# Patient Record
Sex: Female | Born: 1990 | Race: White | Hispanic: No | Marital: Married | Smoking: Former smoker
Health system: Southern US, Community
[De-identification: ages and names within clinical notes are randomized; demographics above are authoritative.]

## PROBLEM LIST (undated history)

## (undated) ENCOUNTER — Inpatient Hospital Stay: Payer: Self-pay

## (undated) DIAGNOSIS — D696 Thrombocytopenia, unspecified: Secondary | ICD-10-CM

## (undated) DIAGNOSIS — F53 Postpartum depression: Secondary | ICD-10-CM

## (undated) DIAGNOSIS — F419 Anxiety disorder, unspecified: Secondary | ICD-10-CM

## (undated) DIAGNOSIS — N915 Oligomenorrhea, unspecified: Secondary | ICD-10-CM

## (undated) DIAGNOSIS — E079 Disorder of thyroid, unspecified: Secondary | ICD-10-CM

## (undated) DIAGNOSIS — O99345 Other mental disorders complicating the puerperium: Secondary | ICD-10-CM

## (undated) DIAGNOSIS — R519 Headache, unspecified: Secondary | ICD-10-CM

## (undated) DIAGNOSIS — O99119 Other diseases of the blood and blood-forming organs and certain disorders involving the immune mechanism complicating pregnancy, unspecified trimester: Secondary | ICD-10-CM

## (undated) DIAGNOSIS — M5126 Other intervertebral disc displacement, lumbar region: Secondary | ICD-10-CM

## (undated) DIAGNOSIS — R51 Headache: Secondary | ICD-10-CM

## (undated) DIAGNOSIS — G43111 Migraine with aura, intractable, with status migrainosus: Secondary | ICD-10-CM

## (undated) DIAGNOSIS — M51369 Other intervertebral disc degeneration, lumbar region without mention of lumbar back pain or lower extremity pain: Secondary | ICD-10-CM

## (undated) DIAGNOSIS — M5136 Other intervertebral disc degeneration, lumbar region: Secondary | ICD-10-CM

## (undated) HISTORY — DX: Anxiety disorder, unspecified: F41.9

## (undated) HISTORY — DX: Disorder of thyroid, unspecified: E07.9

## (undated) HISTORY — DX: Other intervertebral disc displacement, lumbar region: M51.26

## (undated) HISTORY — DX: Headache: R51

## (undated) HISTORY — DX: Other intervertebral disc degeneration, lumbar region without mention of lumbar back pain or lower extremity pain: M51.369

## (undated) HISTORY — DX: Other intervertebral disc degeneration, lumbar region: M51.36

## (undated) HISTORY — DX: Postpartum depression: F53.0

## (undated) HISTORY — DX: Migraine with aura, intractable, with status migrainosus: G43.111

## (undated) HISTORY — DX: Oligomenorrhea, unspecified: N91.5

## (undated) HISTORY — DX: Headache, unspecified: R51.9

## (undated) HISTORY — DX: Other mental disorders complicating the puerperium: O99.345

---
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
## 2008-01-10 ENCOUNTER — Ambulatory Visit: Payer: Self-pay | Admitting: Family Medicine

## 2008-04-01 ENCOUNTER — Ambulatory Visit: Payer: Self-pay | Admitting: Family Medicine

## 2008-04-17 ENCOUNTER — Ambulatory Visit: Payer: Self-pay | Admitting: Nurse Practitioner

## 2008-05-01 ENCOUNTER — Ambulatory Visit: Payer: Self-pay | Admitting: Family Medicine

## 2008-05-15 ENCOUNTER — Ambulatory Visit: Payer: Self-pay | Admitting: Nurse Practitioner

## 2008-07-01 ENCOUNTER — Ambulatory Visit: Payer: Self-pay | Admitting: Nurse Practitioner

## 2009-02-09 ENCOUNTER — Ambulatory Visit: Payer: Self-pay | Admitting: Family Medicine

## 2010-03-02 ENCOUNTER — Ambulatory Visit: Payer: Self-pay | Admitting: Nurse Practitioner

## 2010-09-05 HISTORY — PX: WISDOM TOOTH EXTRACTION: SHX21

## 2011-01-18 NOTE — Assessment & Plan Note (Signed)
NAMEIDA, MILBRATH NO.:  [id number]   MEDICAL RECORD NO.:  [id number]          PATIENT TYPE:  POB   LOCATION:  [facility] at [facility]         FACILITY:  [facility]   PHYSICIAN:  Ginger Carne, MD DATE OF BIRTH:  [birth year]   DATE OF SERVICE:                                  CLINIC NOTE   The patient comes to the office today for a headache followup.  The  patient did take her prednisone as was directed and she was able to get  off all of her over-the-counter medications and she is applauded in that  endeavor.  She has been using her Topamax.  She has gone back and forth  between 50 mg and 75 mg, depending on which she has to do the next day.  She has been using the Imitrex on an as-needed basis.  She has  discontinued all soda as well.  She quit smoking cigarettes 3 months  ago.  Overall, she is doing better.  She still does have some stress  ongoing in her life.  We did have a lengthy discussion about her  returning to exercise, particularly running and she has agreed to do  that.   PHYSICAL EXAMINATION:  VITAL SIGNS:  Blood pressure is 111/89, pulse is  66, weight 148, and height is 5 feet 8.  GENERAL:  Well-developed and well-nourished 20 year old Caucasian female  in no acute distress.  HEENT:  Head is normocephalic and atraumatic.  Pupils are equal and  react.  NEUROLOGIC:  The patient is intact.   ASSESSMENT AND PLAN:  Migraine headaches.   PLAN:  The patient will try to go up to and stay on 75 mg of Topamax  daily.  She was asked to split the dose.  She will also be given Norflex  100 mg 1 p.o. b.i.d. p.r.n. for muscle spasm.  She is again, applauded  for stopping all of her over-the-counter medicines, her sodas, and her  cigarettes.  She is asked to start running program.  She will return to  the clinic in 6 weeks.      Remonia Richter, NP    ______________________________  Ginger Carne, MD    LR/MEDQ  D:  05/15/2008  T:  05/15/2008   Job:  [id number]

## 2011-01-18 NOTE — Assessment & Plan Note (Signed)
[name], Morse NO.:  [id number]   MEDICAL RECORD NO.:  [id number]          PATIENT TYPE:  POB   LOCATION:  [facility] at [facility]         FACILITY:  [facility]   PHYSICIAN:  Allie Bossier, MD        DATE OF BIRTH:  Sep 05, 1991   DATE OF SERVICE:  03/02/2010                                  CLINIC NOTE   The patient comes to office today for followup on her migraine  headaches.  The patient was last seen in October 2009.  Since then, she  has had some worsening of her headaches.  She did not stay on her  Topamax, and she has only been given 4 Imitrex per month.  She, in the  last 2-3 weeks, has developed everyday headache.  She believes this is  related to the stress of school and a recent move.  She has been taking  a lot of over-the-counter medications and realizes she may have rebound  headaches at this point.  She is currently on birth control for  contraception and that is working well.   PHYSICAL EXAMINATION:  GENERAL:  Well-developed, well-nourished 20-year-  old Caucasian female, in no acute distress.  HEENT:  Head is normocephalic and atraumatic.  Pupils equal and react.  CARDIAC:  Regular rate and rhythm.  LUNGS:  Clear bilaterally.   ASSESSMENT:  1. Migraine.  2. Anxiety.   PLAN:  We will give her a prednisone taper.  She will take 20 mg 4 tabs  x1 day, 3 tabs x1 day, 2 tabs x1 day, and 1 tab x1 day.  When she is  finished with that, she will start on Effexor 75 mg 1/2 tablet for 1  week then up to 1 tablet #30 with 3 refills.  We will also switch her  from Imitrex to Maxalt.  She will take Maxalt 10 mg MLT 1 p.o. p.r.n.  migraine, okay to repeat once in 2 hours #12 with  p.r.n. refills.  She  will be scheduled in October for her first Pap smear.  She will be 19  then.  She will return to this clinic in 6 weeks or sooner if need be.      Remonia Richter, NP    ______________________________  Allie Bossier, MD    LR/MEDQ  D:  03/02/2010  T:   03/03/2010  Job:  [id number]

## 2011-01-18 NOTE — Assessment & Plan Note (Signed)
[name], [name]           ACCOUNT NO.:  [id number]   MEDICAL RECORD NO.:  [id number]          PATIENT TYPE:  POB   LOCATION:  [facility] at [facility]         FACILITY:  [facility]   PHYSICIAN:  Johnella Moloney, MD        DATE OF BIRTH:  Jul 05, 1991   DATE OF SERVICE:  07/01/2008                                  CLINIC NOTE   The patient comes to office today for headache followup.  She states  that her headaches have been worse in the last 2-3 weeks.  She relates  this to buying a house and moving with her fiance.  She has also some  stress with school and work.  She actually had a headache bad enough  last week to have to leave work and she did vomit.  She does not have  anything for nausea and vomiting.  She has gotten up to 75 mg on the  Topamax, and she was doing well with that.  She has been doing some  running not every day like she would like to.   PHYSICAL EXAMINATION:  Well-developed, well-nourished 20 year old  Caucasian in no acute distress.  Vital signs were stable.   ASSESSMENT:  Migraine.   PLAN:  We will give her a prednisone taper, prednisone 20 mg 4 tabs for  1 day, 3 tabs for 1 day, 2 tabs for 1 day, and 1 tab for 1 day.  She has  also pulled her back doing some furniture moving and that should help  with both things.  She will be given Phenergan to take on an as-needed  basis for nausea.  We will go up on her Topamax to 100 mg, and she was  given samples of Treximet as well as __________ for birth control pills.  She will return to the office in 3 months or sooner if need be.      Jennifer Morse, Jennifer Morse    ______________________________  Johnella Moloney, MD    LR/MEDQ  D:  07/01/2008  T:  07/02/2008  Job:  [id number]
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
## 2012-10-30 ENCOUNTER — Ambulatory Visit (INDEPENDENT_AMBULATORY_CARE_PROVIDER_SITE_OTHER): Payer: 59 | Admitting: Family Medicine

## 2012-10-30 ENCOUNTER — Encounter: Payer: Self-pay | Admitting: Family Medicine

## 2012-10-30 ENCOUNTER — Other Ambulatory Visit: Payer: Self-pay | Admitting: Family Medicine

## 2012-10-30 VITALS — BP 112/67 | HR 62 | Ht 68.5 in | Wt 183.0 lb

## 2012-10-30 MED ORDER — NORETHIN ACE-ETH ESTRAD-FE 1-20 MG-MCG PO TABS: 1.0000 | ORAL_TABLET | Freq: Every day | ORAL | Status: DC

## 2012-10-30 NOTE — Patient Instructions (Signed)
Preventive Care for Adults, Female A healthy lifestyle and preventive care can promote health and wellness. Preventive health guidelines for women include the following key practices.  A routine yearly physical is a good way to check with your caregiver about your health and preventive screening. It is a chance to share any concerns and updates on your health, and to receive a thorough exam.  Visit your dentist for a routine exam and preventive care every 6 months. Brush your teeth twice a day and floss once a day. Good oral hygiene prevents tooth decay and gum disease.  The frequency of eye exams is based on your age, health, family medical history, use of contact lenses, and other factors. Follow your caregiver's recommendations for frequency of eye exams.  Eat a healthy diet. Foods like vegetables, fruits, whole grains, low-fat dairy products, and lean protein foods contain the nutrients you need without too many calories. Decrease your intake of foods high in solid fats, added sugars, and salt. Eat the right amount of calories for you.Get information about a proper diet from your caregiver, if necessary.  Regular physical exercise is one of the most important things you can do for your health. Most adults should get at least 150 minutes of moderate-intensity exercise (any activity that increases your heart rate and causes you to sweat) each week. In addition, most adults need muscle-strengthening exercises on 2 or more days a week.  Maintain a healthy weight. The body mass index (BMI) is a screening tool to identify possible weight problems. It provides an estimate of body fat based on height and weight. Your caregiver can help determine your BMI, and can help you achieve or maintain a healthy weight.For adults 20 years and older:  A BMI below 18.5 is considered underweight.  A BMI of 18.5 to 24.9 is normal.  A BMI of 25 to 29.9 is considered overweight.  A BMI of 30 and above is  considered obese.  Maintain normal blood lipids and cholesterol levels by exercising and minimizing your intake of saturated fat. Eat a balanced diet with plenty of fruit and vegetables. Blood tests for lipids and cholesterol should begin at age 20 and be repeated every 5 years. If your lipid or cholesterol levels are high, you are over 50, or you are at high risk for heart disease, you may need your cholesterol levels checked more frequently.Ongoing high lipid and cholesterol levels should be treated with medicines if diet and exercise are not effective.  If you smoke, find out from your caregiver how to quit. If you do not use tobacco, do not start.  If you are pregnant, do not drink alcohol. If you are breastfeeding, be very cautious about drinking alcohol. If you are not pregnant and choose to drink alcohol, do not exceed 1 drink per day. One drink is considered to be 12 ounces (355 mL) of beer, 5 ounces (148 mL) of wine, or 1.5 ounces (44 mL) of liquor.  Avoid use of street drugs. Do not share needles with anyone. Ask for help if you need support or instructions about stopping the use of drugs.  High blood pressure causes heart disease and increases the risk of stroke. Your blood pressure should be checked at least every 1 to 2 years. Ongoing high blood pressure should be treated with medicines if weight loss and exercise are not effective.  If you are 55 to 24 years old, ask your caregiver if you should take aspirin to prevent strokes.  Diabetes   screening involves taking a blood sample to check your fasting blood sugar level. This should be done once every 3 years, after age 45, if you are within normal weight and without risk factors for diabetes. Testing should be considered at a younger age or be carried out more frequently if you are overweight and have at least 1 risk factor for diabetes.  Breast cancer screening is essential preventive care for women. You should practice "breast  self-awareness." This means understanding the normal appearance and feel of your breasts and may include breast self-examination. Any changes detected, no matter how small, should be reported to a caregiver. Women in their 20s and 30s should have a clinical breast exam (CBE) by a caregiver as part of a regular health exam every 1 to 3 years. After age 40, women should have a CBE every year. Starting at age 40, women should consider having a mammography (breast X-ray test) every year. Women who have a family history of breast cancer should talk to their caregiver about genetic screening. Women at a high risk of breast cancer should talk to their caregivers about having magnetic resonance imaging (MRI) and a mammography every year.  The Pap test is a screening test for cervical cancer. A Pap test can show cell changes on the cervix that might become cervical cancer if left untreated. A Pap test is a procedure in which cells are obtained and examined from the lower end of the uterus (cervix).  Women should have a Pap test starting at age 21.  Between ages 21 and 29, Pap tests should be repeated every 2 years.  Beginning at age 30, you should have a Pap test every 3 years as long as the past 3 Pap tests have been normal.  Some women have medical problems that increase the chance of getting cervical cancer. Talk to your caregiver about these problems. It is especially important to talk to your caregiver if a new problem develops soon after your last Pap test. In these cases, your caregiver may recommend more frequent screening and Pap tests.  The above recommendations are the same for women who have or have not gotten the vaccine for human papillomavirus (HPV).  If you had a hysterectomy for a problem that was not cancer or a condition that could lead to cancer, then you no longer need Pap tests. Even if you no longer need a Pap test, a regular exam is a good idea to make sure no other problems are  starting.  If you are between ages 65 and 70, and you have had normal Pap tests going back 10 years, you no longer need Pap tests. Even if you no longer need a Pap test, a regular exam is a good idea to make sure no other problems are starting.  If you have had past treatment for cervical cancer or a condition that could lead to cancer, you need Pap tests and screening for cancer for at least 20 years after your treatment.  If Pap tests have been discontinued, risk factors (such as a new sexual partner) need to be reassessed to determine if screening should be resumed.  The HPV test is an additional test that may be used for cervical cancer screening. The HPV test looks for the virus that can cause the cell changes on the cervix. The cells collected during the Pap test can be tested for HPV. The HPV test could be used to screen women aged 30 years and older, and should   be used in women of any age who have unclear Pap test results. After the age of 30, women should have HPV testing at the same frequency as a Pap test.  Colorectal cancer can be detected and often prevented. Most routine colorectal cancer screening begins at the age of 50 and continues through age 75. However, your caregiver may recommend screening at an earlier age if you have risk factors for colon cancer. On a yearly basis, your caregiver may provide home test kits to check for hidden blood in the stool. Use of a small camera at the end of a tube, to directly examine the colon (sigmoidoscopy or colonoscopy), can detect the earliest forms of colorectal cancer. Talk to your caregiver about this at age 50, when routine screening begins. Direct examination of the colon should be repeated every 5 to 10 years through age 75, unless early forms of pre-cancerous polyps or small growths are found.  Hepatitis C blood testing is recommended for all people born from 1945 through 1965 and any individual with known risks for hepatitis C.  Practice  safe sex. Use condoms and avoid high-risk sexual practices to reduce the spread of sexually transmitted infections (STIs). STIs include gonorrhea, chlamydia, syphilis, trichomonas, herpes, HPV, and human immunodeficiency virus (HIV). Herpes, HIV, and HPV are viral illnesses that have no cure. They can result in disability, cancer, and death. Sexually active women aged 25 and younger should be checked for chlamydia. Older women with new or multiple partners should also be tested for chlamydia. Testing for other STIs is recommended if you are sexually active and at increased risk.  Osteoporosis is a disease in which the bones lose minerals and strength with aging. This can result in serious bone fractures. The risk of osteoporosis can be identified using a bone density scan. Women ages 65 and over and women at risk for fractures or osteoporosis should discuss screening with their caregivers. Ask your caregiver whether you should take a calcium supplement or vitamin D to reduce the rate of osteoporosis.  Menopause can be associated with physical symptoms and risks. Hormone replacement therapy is available to decrease symptoms and risks. You should talk to your caregiver about whether hormone replacement therapy is right for you.  Use sunscreen with sun protection factor (SPF) of 30 or more. Apply sunscreen liberally and repeatedly throughout the day. You should seek shade when your shadow is shorter than you. Protect yourself by wearing long sleeves, pants, a wide-brimmed hat, and sunglasses year round, whenever you are outdoors.  Once a month, do a whole body skin exam, using a mirror to look at the skin on your back. Notify your caregiver of new moles, moles that have irregular borders, moles that are larger than a pencil eraser, or moles that have changed in shape or color.  Stay current with required immunizations.  Influenza. You need a dose every fall (or winter). The composition of the flu vaccine  changes each year, so being vaccinated once is not enough.  Pneumococcal polysaccharide. You need 1 to 2 doses if you smoke cigarettes or if you have certain chronic medical conditions. You need 1 dose at age 65 (or older) if you have never been vaccinated.  Tetanus, diphtheria, pertussis (Tdap, Td). Get 1 dose of Tdap vaccine if you are younger than age 65, are over 65 and have contact with an infant, are a healthcare worker, are pregnant, or simply want to be protected from whooping cough. After that, you need a Td   booster dose every 10 years. Consult your caregiver if you have not had at least 3 tetanus and diphtheria-containing shots sometime in your life or have a deep or dirty wound.  HPV. You need this vaccine if you are a woman age 26 or younger. The vaccine is given in 3 doses over 6 months.  Measles, mumps, rubella (MMR). You need at least 1 dose of MMR if you were born in 1957 or later. You may also need a second dose.  Meningococcal. If you are age 19 to 21 and a first-year college student living in a residence hall, or have one of several medical conditions, you need to get vaccinated against meningococcal disease. You may also need additional booster doses.  Zoster (shingles). If you are age 60 or older, you should get this vaccine.  Varicella (chickenpox). If you have never had chickenpox or you were vaccinated but received only 1 dose, talk to your caregiver to find out if you need this vaccine.  Hepatitis A. You need this vaccine if you have a specific risk factor for hepatitis A virus infection or you simply wish to be protected from this disease. The vaccine is usually given as 2 doses, 6 to 18 months apart.  Hepatitis B. You need this vaccine if you have a specific risk factor for hepatitis B virus infection or you simply wish to be protected from this disease. The vaccine is given in 3 doses, usually over 6 months. Preventive Services / Frequency Ages 19 to 39  Blood  pressure check.** / Every 1 to 2 years.  Lipid and cholesterol check.** / Every 5 years beginning at age 20.  Clinical breast exam.** / Every 3 years for women in their 20s and 30s.  Pap test.** / Every 2 years from ages 21 through 29. Every 3 years starting at age 30 through age 65 or 70 with a history of 3 consecutive normal Pap tests.  HPV screening.** / Every 3 years from ages 30 through ages 65 to 70 with a history of 3 consecutive normal Pap tests.  Hepatitis C blood test.** / For any individual with known risks for hepatitis C.  Skin self-exam. / Monthly.  Influenza immunization.** / Every year.  Pneumococcal polysaccharide immunization.** / 1 to 2 doses if you smoke cigarettes or if you have certain chronic medical conditions.  Tetanus, diphtheria, pertussis (Tdap, Td) immunization. / A one-time dose of Tdap vaccine. After that, you need a Td booster dose every 10 years.  HPV immunization. / 3 doses over 6 months, if you are 26 and younger.  Measles, mumps, rubella (MMR) immunization. / You need at least 1 dose of MMR if you were born in 1957 or later. You may also need a second dose.  Meningococcal immunization. / 1 dose if you are age 19 to 21 and a first-year college student living in a residence hall, or have one of several medical conditions, you need to get vaccinated against meningococcal disease. You may also need additional booster doses.  Varicella immunization.** / Consult your caregiver.  Hepatitis A immunization.** / Consult your caregiver. 2 doses, 6 to 18 months apart.  Hepatitis B immunization.** / Consult your caregiver. 3 doses usually over 6 months. Ages 40 to 64  Blood pressure check.** / Every 1 to 2 years.  Lipid and cholesterol check.** / Every 5 years beginning at age 20.  Clinical breast exam.** / Every year after age 40.  Mammogram.** / Every year beginning at age 40   and continuing for as long as you are in good health. Consult with your  caregiver.  Pap test.** / Every 3 years starting at age 30 through age 65 or 70 with a history of 3 consecutive normal Pap tests.  HPV screening.** / Every 3 years from ages 30 through ages 65 to 70 with a history of 3 consecutive normal Pap tests.  Fecal occult blood test (FOBT) of stool. / Every year beginning at age 50 and continuing until age 75. You may not need to do this test if you get a colonoscopy every 10 years.  Flexible sigmoidoscopy or colonoscopy.** / Every 5 years for a flexible sigmoidoscopy or every 10 years for a colonoscopy beginning at age 50 and continuing until age 75.  Hepatitis C blood test.** / For all people born from 1945 through 1965 and any individual with known risks for hepatitis C.  Skin self-exam. / Monthly.  Influenza immunization.** / Every year.  Pneumococcal polysaccharide immunization.** / 1 to 2 doses if you smoke cigarettes or if you have certain chronic medical conditions.  Tetanus, diphtheria, pertussis (Tdap, Td) immunization.** / A one-time dose of Tdap vaccine. After that, you need a Td booster dose every 10 years.  Measles, mumps, rubella (MMR) immunization. / You need at least 1 dose of MMR if you were born in 1957 or later. You may also need a second dose.  Varicella immunization.** / Consult your caregiver.  Meningococcal immunization.** / Consult your caregiver.  Hepatitis A immunization.** / Consult your caregiver. 2 doses, 6 to 18 months apart.  Hepatitis B immunization.** / Consult your caregiver. 3 doses, usually over 6 months. Ages 65 and over  Blood pressure check.** / Every 1 to 2 years.  Lipid and cholesterol check.** / Every 5 years beginning at age 20.  Clinical breast exam.** / Every year after age 40.  Mammogram.** / Every year beginning at age 40 and continuing for as long as you are in good health. Consult with your caregiver.  Pap test.** / Every 3 years starting at age 30 through age 65 or 70 with a 3  consecutive normal Pap tests. Testing can be stopped between 65 and 70 with 3 consecutive normal Pap tests and no abnormal Pap or HPV tests in the past 10 years.  HPV screening.** / Every 3 years from ages 30 through ages 65 or 70 with a history of 3 consecutive normal Pap tests. Testing can be stopped between 65 and 70 with 3 consecutive normal Pap tests and no abnormal Pap or HPV tests in the past 10 years.  Fecal occult blood test (FOBT) of stool. / Every year beginning at age 50 and continuing until age 75. You may not need to do this test if you get a colonoscopy every 10 years.  Flexible sigmoidoscopy or colonoscopy.** / Every 5 years for a flexible sigmoidoscopy or every 10 years for a colonoscopy beginning at age 50 and continuing until age 75.  Hepatitis C blood test.** / For all people born from 1945 through 1965 and any individual with known risks for hepatitis C.  Osteoporosis screening.** / A one-time screening for women ages 65 and over and women at risk for fractures or osteoporosis.  Skin self-exam. / Monthly.  Influenza immunization.** / Every year.  Pneumococcal polysaccharide immunization.** / 1 dose at age 65 (or older) if you have never been vaccinated.  Tetanus, diphtheria, pertussis (Tdap, Td) immunization. / A one-time dose of Tdap vaccine if you are over   65 and have contact with an infant, are a healthcare worker, or simply want to be protected from whooping cough. After that, you need a Td booster dose every 10 years.  Varicella immunization.** / Consult your caregiver.  Meningococcal immunization.** / Consult your caregiver.  Hepatitis A immunization.** / Consult your caregiver. 2 doses, 6 to 18 months apart.  Hepatitis B immunization.** / Check with your caregiver. 3 doses, usually over 6 months. ** Family history and personal history of risk and conditions may change your caregiver's recommendations. Document Released: 10/18/2001 Document Revised: 11/14/2011  Document Reviewed: 01/17/2011 [employer] Patient Information 2013 [employer].  Constipation, Adult Constipation is when a person has fewer than 3 bowel movements a week; has difficulty having a bowel movement; or has stools that are dry, hard, or larger than normal. As people grow older, constipation is more common. If you try to fix constipation with medicines that make you have a bowel movement (laxatives), the problem may get worse. Long-term laxative use may cause the muscles of the colon to become weak. A low-fiber diet, not taking in enough fluids, and taking certain medicines may make constipation worse. CAUSES   Certain medicines, such as antidepressants, pain medicine, iron supplements, antacids, and water pills.   Certain diseases, such as diabetes, irritable bowel syndrome (IBS), thyroid disease, or depression.   Not drinking enough water.   Not eating enough fiber-rich foods.   Stress or travel.  Lack of physical activity or exercise.  Not going to the restroom when there is the urge to have a bowel movement.  Ignoring the urge to have a bowel movement.  Using laxatives too much. SYMPTOMS   Having fewer than 3 bowel movements a week.   Straining to have a bowel movement.   Having hard, dry, or larger than normal stools.   Feeling full or bloated.   Pain in the lower abdomen.  Not feeling relief after having a bowel movement. DIAGNOSIS  Your caregiver will take a medical history and perform a physical exam. Further testing may be done for severe constipation. Some tests may include:   A barium enema X-ray to examine your rectum, colon, and sometimes, your small intestine.  A sigmoidoscopy to examine your lower colon.  A colonoscopy to examine your entire colon. TREATMENT  Treatment will depend on the severity of your constipation and what is causing it. Some dietary treatments include drinking more fluids and eating more fiber-rich foods. Lifestyle  treatments may include regular exercise. If these diet and lifestyle recommendations do not help, your caregiver may recommend taking over-the-counter laxative medicines to help you have bowel movements. Prescription medicines may be prescribed if over-the-counter medicines do not work.  HOME CARE INSTRUCTIONS   Increase dietary fiber in your diet, such as fruits, vegetables, whole grains, and beans. Limit high-fat and processed sugars in your diet, such as French fries, hamburgers, cookies, candies, and soda.   A fiber supplement may be added to your diet if you cannot get enough fiber from foods.   Drink enough fluids to keep your urine clear or pale yellow.   Exercise regularly or as directed by your caregiver.   Go to the restroom when you have the urge to go. Do not hold it.  Only take medicines as directed by your caregiver. Do not take other medicines for constipation without talking to your caregiver first. SEEK IMMEDIATE MEDICAL CARE IF:   You have bright red blood in your stool.   Your constipation lasts for   more than 4 days or gets worse.   You have abdominal or rectal pain.   You have thin, pencil-like stools.  You have unexplained weight loss. MAKE SURE YOU:   Understand these instructions.  Will watch your condition.  Will get help right away if you are not doing well or get worse. Document Released: 05/20/2004 Document Revised: 11/14/2011 Document Reviewed: 07/26/2011 Exit[employer] Patient Information 2013 [employer].   

## 2012-10-30 NOTE — Progress Notes (Signed)
Here today for gyn physical, no complaints.  Does need refill on her BC.  Would like to know if there is BC pill that does not cause weight gain.  Increase in headaches, some are migraine, 3x week.  Interested in weight loss options.

## 2012-10-30 NOTE — Progress Notes (Signed)
  Subjective:     Jennifer Morse is a 22 y.o. female and is here for a comprehensive physical exam. The patient reports problems - weight gain, after switch from previous OC's.  Has h/o migraine with aura, but she refuses to d/c OC's.  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 Marital Status: Single    Spouse Name: N/A    Number of Children: N/A  . Years of Education: N/A   Occupational History  . Not on file.   Social History Main Topics  . Smoking status: Light Tobacco Smoker -- 0.25 packs/day    Types: Cigarettes  . Smokeless tobacco: Not on file  . Alcohol Use: 0.6 oz/week    1 Glasses of wine per week  . Drug Use: No  . Sexually Active: Yes    Birth Control/ Protection: Pill   Other Topics Concern  . Not on file   Social History Narrative  . No narrative on file   No health maintenance topics applied.  The following portions of the patient's history were reviewed and updated as appropriate: allergies, current medications, past family history, past medical history, past social history, past surgical history and problem list.  Review of Systems A comprehensive review of systems was negative.   Objective:    BP 112/67  Pulse 62  Ht 5' 8.5" (1.74 m)  Wt 183 lb (83.008 kg)  BMI 27.42 kg/m2  LMP 10/16/2012 General appearance: alert, cooperative and appears stated age Head: Normocephalic, without obvious abnormality, atraumatic Neck: no adenopathy, supple, symmetrical, trachea midline and thyroid not enlarged, symmetric, no tenderness/mass/nodules Lungs: clear to auscultation bilaterally Breasts: normal appearance, no masses or tenderness Heart: normal apical impulse Abdomen: soft, non-tender; bowel sounds normal; no masses,  no organomegaly Pelvic: cervix normal in appearance, external genitalia normal, no adnexal masses or tenderness, no cervical motion tenderness, uterus normal size, shape, and consistency,  vagina normal without discharge and shaved Extremities: extremities normal, atraumatic, no cyanosis or edema Pulses: 2+ and symmetric Skin: Skin color, texture, turgor normal. No rashes or lesions Lymph nodes: Cervical, supraclavicular, and axillary nodes normal. Neurologic: Grossly normal    Assessment:    Healthy female exam.      Plan:  Pap smear Refilled OC's Referral for OC study Watch headache pattern and return if worsens. Discussed nutrition and exercise and weight loss.   See After Visit Summary for Counseling Recommendations

## 2013-01-02 LAB — PAP LB, CT-NG, RFX HPV ASCU
Chlamydia, Nuc. Acid Amp: POSITIVE — AB
Gonococcus, Nuc. Acid Amp: NEGATIVE
PAP Smear Comment: 0

## 2013-01-03 ENCOUNTER — Telehealth: Payer: Self-pay | Admitting: *Deleted

## 2013-01-03 DIAGNOSIS — A749 Chlamydial infection, unspecified: Secondary | ICD-10-CM

## 2013-01-03 MED ORDER — AZITHROMYCIN 500 MG PO TABS: 1000.0000 mg | ORAL_TABLET | Freq: Every day | ORAL | Status: DC

## 2013-01-03 NOTE — Telephone Encounter (Signed)
Message copied by Barbara Cower on Thu Jan 03, 2013  2:04 PM ------      Message from: Reva Bores      Created: Thu Jan 03, 2013  8:04 AM       + chlamydia from [facility]?  She needs treatment and partner referral.--What is up with the length of time to result? ------

## 2013-01-03 NOTE — Telephone Encounter (Signed)
Patient notified of result and will have her partner get treated as well.

## 2013-01-08 ENCOUNTER — Encounter: Payer: Self-pay | Admitting: Obstetrics and Gynecology

## 2013-01-08 ENCOUNTER — Ambulatory Visit (INDEPENDENT_AMBULATORY_CARE_PROVIDER_SITE_OTHER): Payer: 59 | Admitting: *Deleted

## 2013-01-08 DIAGNOSIS — Z130 Encounter for screening for diseases of the blood and blood-forming organs and certain disorders involving the immune mechanism: Secondary | ICD-10-CM

## 2013-01-08 LAB — CBC WITH DIFFERENTIAL/PLATELET
Basophils Absolute: 0 10*3/uL (ref 0.0–0.1)
Basophils Relative: 1 % (ref 0–1)
Eosinophils Absolute: 0.1 10*3/uL (ref 0.0–0.7)
MCH: 29 pg (ref 26.0–34.0)
MCHC: 33 g/dL (ref 30.0–36.0)
Monocytes Absolute: 0.4 10*3/uL (ref 0.1–1.0)
Neutro Abs: 1.7 10*3/uL (ref 1.7–7.7)
Neutrophils Relative %: 42 % — ABNORMAL LOW (ref 43–77)
RDW: 13.7 % (ref 11.5–15.5)

## 2013-01-08 NOTE — Addendum Note (Signed)
Addended by: Barbara Cower on: 01/08/2013 10:05 AM   Modules accepted: Orders, Level of Service

## 2013-01-08 NOTE — Progress Notes (Signed)
Patient had physical in February and just needs paperwork filled out.  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

## 2013-01-08 NOTE — Progress Notes (Signed)
Here today for yearly general medical physical for school.

## 2013-01-30 ENCOUNTER — Emergency Department: Payer: Self-pay | Admitting: Emergency Medicine

## 2013-02-04 ENCOUNTER — Telehealth: Payer: Self-pay | Admitting: *Deleted

## 2013-02-04 DIAGNOSIS — IMO0001 Reserved for inherently not codable concepts without codable children: Secondary | ICD-10-CM

## 2013-02-04 MED ORDER — NORETHIN ACE-ETH ESTRAD-FE 1-20 MG-MCG PO TABS: 1.0000 | ORAL_TABLET | Freq: Every day | ORAL | Status: DC

## 2013-02-04 NOTE — Telephone Encounter (Signed)
Patient needs ocp's sent to mail order her insurance will only allow her to get three packs from a local pharmacy.

## 2013-12-02 ENCOUNTER — Ambulatory Visit: Payer: 59 | Admitting: Adult Health

## 2013-12-12 ENCOUNTER — Ambulatory Visit (INDEPENDENT_AMBULATORY_CARE_PROVIDER_SITE_OTHER): Payer: 59 | Admitting: Adult Health

## 2013-12-12 ENCOUNTER — Encounter: Payer: Self-pay | Admitting: Adult Health

## 2013-12-12 ENCOUNTER — Telehealth: Payer: Self-pay | Admitting: Adult Health

## 2013-12-12 VITALS — BP 100/60 | HR 67 | Temp 97.9°F | Resp 14 | Ht 68.5 in | Wt 205.0 lb

## 2013-12-12 DIAGNOSIS — F411 Generalized anxiety disorder: Secondary | ICD-10-CM

## 2013-12-12 DIAGNOSIS — F418 Other specified anxiety disorders: Secondary | ICD-10-CM

## 2013-12-12 DIAGNOSIS — Z139 Encounter for screening, unspecified: Secondary | ICD-10-CM

## 2013-12-12 DIAGNOSIS — R635 Abnormal weight gain: Secondary | ICD-10-CM

## 2013-12-12 DIAGNOSIS — G43909 Migraine, unspecified, not intractable, without status migrainosus: Secondary | ICD-10-CM

## 2013-12-12 DIAGNOSIS — Z3009 Encounter for other general counseling and advice on contraception: Secondary | ICD-10-CM

## 2013-12-12 LAB — POCT URINE PREGNANCY: PREG TEST UR: NEGATIVE

## 2013-12-12 MED ORDER — PROPRANOLOL HCL 40 MG PO TABS: 40.0000 mg | ORAL_TABLET | Freq: Two times a day (BID) | ORAL | Status: DC

## 2013-12-12 NOTE — Progress Notes (Signed)
Pre visit review using our clinic review tool, if applicable. No additional management support is needed unless otherwise documented below in the visit note. 

## 2013-12-12 NOTE — Telephone Encounter (Signed)
Relevant patient education mailed to patient.  

## 2013-12-12 NOTE — Patient Instructions (Signed)
  I am sending you for an MRI

## 2013-12-12 NOTE — Progress Notes (Signed)
Patient ID: LUV MISH, female   DOB: 1991-01-12, 23 y.o.   MRN: [id number]    Subjective:    Patient ID: Jennifer Morse, female    DOB: 07/30/1991, 23 y.o.   MRN: [id number]  HPI  Pt is a pleasant 23 year old female who presents to clinic to establish care. She has not had a primary care provider in years. She reports having severe migraine headaches. She has approximately 3-4 on a weekly basis. She goes through 200 tab of excedrin tylenol ~ monthly. Has been to see a headache specialist. She was tried on topamax but it did not work. She has taken imitrex with good results but insurance (at the time) would only give her 6 tablets. She would stress over when to take the imitrex which brought on more migraines. Has not noticed any food sensitivities. She has never had an MRI or CT.  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 She reports increasing stress. Has considerable amount of test anxiety which triggers migraines. Has not been exercising 2/2 not having any time. She has gained weight and is stressed out over this. Patient was on birth control but stopped taking medication because she felt this was contributing to her weight gain. She reports using the withdrawal method to prevent pregnancy.   Past Medical History  Diagnosis Date  . Migraine with aura, intractable, with status migrainosus 1995  . Frequent headaches     Current Outpatient Prescriptions on File Prior to Visit  Medication Sig Dispense Refill  . aspirin-acetaminophen-caffeine (EXCEDRIN MIGRAINE) 250-250-65 MG per tablet Take 1 tablet by mouth every 6 (six) hours as needed for pain.       No current facility-administered medications on file prior to visit.     Review of Systems  Constitutional: Positive for unexpected weight change.  Eyes: Negative.   Respiratory: Negative.   Cardiovascular: Negative.   Gastrointestinal: Negative.   Endocrine: Negative.   Genitourinary: Negative.    Musculoskeletal: Negative.   Skin: Negative.   Allergic/Immunologic: Positive for environmental allergies.  Neurological: Positive for headaches (severe migraine HA - 3-4 weekly). Negative for dizziness, tremors, syncope, speech difficulty, weakness, light-headedness and numbness.  Hematological: Negative.   Psychiatric/Behavioral: Negative for behavioral problems, confusion and agitation. The patient is nervous/anxious (stress, test anxiety).        Tearful 2/2 weight gain, multiple stressors and uncontrolled migraines.  All other systems reviewed and are negative.      Objective:  BP 100/60  Pulse 67  Temp(Src) 97.9 F (36.6 C) (Oral)  Resp 14  Ht 5' 8.5" (1.74 m)  Wt 205 lb (92.987 kg)  BMI 30.71 kg/m2  SpO2 97%  LMP 11/04/2013   Physical Exam  Constitutional: She is oriented to person, place, and time. No distress.  Overweight, pleasant 23 y/o female  HENT:  Head: Normocephalic and atraumatic.  Eyes: Conjunctivae and EOM are normal.  Neck: Normal range of motion. Neck supple.  Cardiovascular: Normal rate, regular rhythm, normal heart sounds and intact distal pulses.  Exam reveals no gallop and no friction rub.   No murmur heard. Pulmonary/Chest: Effort normal and breath sounds normal. No respiratory distress. She has no wheezes. She has no rales.  Musculoskeletal: Normal range of motion.  Neurological: She is alert and oriented to person, place, and time. She has normal reflexes. Coordination normal.  Skin: Skin is warm and dry.  Psychiatric: Her behavior is normal. Judgment and thought content normal.  Tearful at  times       Assessment & Plan:   1. Migraine Severe HA occuring 3-4 times weekly. Start propranolol 40 mg bid for prevention but also for test anxiety. She has taken imitrex in the past with good results. Send in prescription. - MR Angiogram Head Wo Contrast; Future  2. Screening Not using any birth control other than coitus interrupt. Check HCG. -  POCT urine pregnancy  3. Birth control counseling Recommend start BC continuous active pill x 12 weeks. This may also help prevent hormonal changes and therefore prevent hormone related migraine. Discussed that current [facility] method she is using is not very effective. Higher changes of pregnancy. She fears that the pill will make her gain weight. Long discussion regarding cause of weight gain in her case more likely related to 1) not exercising 2) stress/release of cortisol 3) pleasure eating to deal with stress - not being mindful of what she is eating. 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  4. Test anxiety Treat with propranolol. As long as her HR does not go below 60 or she does not feel sluggish we will continue this for several months to see if helps her migraine and also test anxiety. Prior to test she may have to take additional dose.   5. Weight gain Suggest that she keep a food diary to make her aware of what she is eating. Needs to exercise at least 10 minutes daily. There is a 10 min workout that is very effective. This will help her overall and also help her deal with stress.

## 2013-12-13 DIAGNOSIS — G43909 Migraine, unspecified, not intractable, without status migrainosus: Secondary | ICD-10-CM | POA: Insufficient documentation

## 2013-12-13 DIAGNOSIS — Z3009 Encounter for other general counseling and advice on contraception: Secondary | ICD-10-CM | POA: Insufficient documentation

## 2013-12-13 DIAGNOSIS — R635 Abnormal weight gain: Secondary | ICD-10-CM | POA: Insufficient documentation

## 2013-12-13 DIAGNOSIS — F418 Other specified anxiety disorders: Secondary | ICD-10-CM | POA: Insufficient documentation

## 2013-12-13 DIAGNOSIS — Z139 Encounter for screening, unspecified: Secondary | ICD-10-CM | POA: Insufficient documentation

## 2013-12-13 MED ORDER — SUMATRIPTAN SUCCINATE 100 MG PO TABS: ORAL_TABLET | ORAL | Status: DC

## 2013-12-19 ENCOUNTER — Ambulatory Visit: Payer: Self-pay | Admitting: Adult Health

## 2013-12-20 ENCOUNTER — Telehealth: Payer: Self-pay | Admitting: *Deleted

## 2013-12-20 NOTE — Telephone Encounter (Signed)
Pt called looking for results from yesterdays MRA

## 2013-12-21 ENCOUNTER — Other Ambulatory Visit: Payer: Self-pay | Admitting: Adult Health

## 2013-12-21 DIAGNOSIS — J329 Chronic sinusitis, unspecified: Secondary | ICD-10-CM

## 2013-12-21 NOTE — Telephone Encounter (Signed)
MRI of the brain normal. Showed right sinus inflammation. I would like to refer her to a ENT for evaluation. The office will contact you with an appointment.

## 2013-12-23 NOTE — Telephone Encounter (Signed)
Pt aware MRI was Normal and of her apt with ENT

## 2014-01-16 ENCOUNTER — Encounter: Payer: Self-pay | Admitting: Adult Health

## 2014-03-14 ENCOUNTER — Other Ambulatory Visit: Payer: Self-pay | Admitting: Adult Health

## 2014-03-14 NOTE — Telephone Encounter (Signed)
Ok to fill 

## 2014-07-29 ENCOUNTER — Ambulatory Visit: Payer: Self-pay | Admitting: Physician Assistant

## 2014-09-05 NOTE — L&D Delivery Note (Cosign Needed)
Delivery Note At  a viable female (Jennifer Morse) was delivered via  (Presentation:ROA ;  ).  APGAR:9,9 ; weight  .  8#3oz Placenta status:delivered intact with 3 vessel Cord:  with the following complications:none .  Cord pH: NA  Anesthesia:  epidural Episiotomy:  none Lacerations:  2nd degree plus right perilabial Suture Repair: 2.0 3.0 chromic vicryl rapide Est. Blood Loss (mL):  350  Mom to postpartum.  Baby to Couplet care / Skin to Skin.  Ronald Vinsant Burr 07/01/2015, 9:52 AM

## 2015-02-26 ENCOUNTER — Ambulatory Visit
Admission: EM | Admit: 2015-02-26 | Discharge: 2015-02-26 | Disposition: A | Discharge status: Home / Self Care | Payer: Self-pay | Attending: Internal Medicine | Admitting: Internal Medicine

## 2015-02-26 ENCOUNTER — Ambulatory Visit
Admit: 2015-02-26 | Discharge: 2015-02-26 | Disposition: A | Discharge status: Home / Self Care | Payer: Self-pay | Attending: Internal Medicine | Admitting: Internal Medicine

## 2015-02-26 ENCOUNTER — Other Ambulatory Visit: Payer: Self-pay | Admitting: Registered Nurse

## 2015-02-26 ENCOUNTER — Ambulatory Visit
Admission: RE | Admit: 2015-02-26 | Discharge: 2015-02-26 | Disposition: A | Discharge status: Home / Self Care | Payer: Self-pay | Source: Ambulatory Visit | Attending: Registered Nurse | Admitting: Registered Nurse

## 2015-02-26 DIAGNOSIS — N898 Other specified noninflammatory disorders of vagina: Secondary | ICD-10-CM

## 2015-02-26 DIAGNOSIS — N926 Irregular menstruation, unspecified: Secondary | ICD-10-CM

## 2015-02-26 DIAGNOSIS — Z331 Pregnant state, incidental: Secondary | ICD-10-CM | POA: Insufficient documentation

## 2015-02-26 DIAGNOSIS — Z3490 Encounter for supervision of normal pregnancy, unspecified, unspecified trimester: Secondary | ICD-10-CM

## 2015-02-26 DIAGNOSIS — R1084 Generalized abdominal pain: Secondary | ICD-10-CM

## 2015-02-26 DIAGNOSIS — Z3687 Encounter for antenatal screening for uncertain dates: Secondary | ICD-10-CM

## 2015-02-26 DIAGNOSIS — Z36 Encounter for antenatal screening of mother: Secondary | ICD-10-CM | POA: Insufficient documentation

## 2015-02-26 DIAGNOSIS — O26891 Other specified pregnancy related conditions, first trimester: Secondary | ICD-10-CM

## 2015-02-26 LAB — URINALYSIS COMPLETE WITH MICROSCOPIC (ARMC ONLY)
Bilirubin Urine: NEGATIVE
Glucose, UA: NEGATIVE mg/dL
Hgb urine dipstick: NEGATIVE
Ketones, ur: NEGATIVE mg/dL
Leukocytes, UA: NEGATIVE
Nitrite: NEGATIVE
PH: 7 (ref 5.0–8.0)
Protein, ur: NEGATIVE mg/dL
SPECIFIC GRAVITY, URINE: 1.025 (ref 1.005–1.030)

## 2015-02-26 LAB — HCG, QUANTITATIVE, PREGNANCY: hCG, Beta Chain, Quant, S: 12654 m[IU]/mL — ABNORMAL HIGH (ref <5)

## 2015-02-26 LAB — WET PREP, GENITAL
Clue Cells Wet Prep HPF POC: NONE SEEN
TRICH WET PREP: NONE SEEN
Yeast Wet Prep HPF POC: NONE SEEN

## 2015-02-26 NOTE — ED Notes (Signed)
Pt states "I have severe pelvic pain mostly in my right side. I took a urine pregnancy test this morning and it registered 3 (+) weeks positive. I don't think I should have pelvic pain like this."

## 2015-02-26 NOTE — ED Notes (Signed)
Provider in to speak with patient. Order for out-patient Ultrasound. Call to scheduling to confirm time.

## 2015-02-26 NOTE — Discharge Instructions (Signed)
Abdominal Pain During Pregnancy °Abdominal pain is common in pregnancy. Most of the time, it does not cause harm. There are many causes of abdominal pain. Some causes are more serious than others. Some of the causes of abdominal pain in pregnancy are easily diagnosed. Occasionally, the diagnosis takes time to understand. Other times, the cause is not determined. Abdominal pain can be a sign that something is very wrong with the pregnancy, or the pain may have nothing to do with the pregnancy at all. For this reason, always tell your health care provider if you have any abdominal discomfort. °HOME CARE INSTRUCTIONS  °Monitor your abdominal pain for any changes. The following actions may help to alleviate any discomfort you are experiencing: °· Do not have sexual intercourse or put anything in your vagina until your symptoms go away completely. °· Get plenty of rest until your pain improves. °· Drink clear fluids if you feel nauseous. Avoid solid food as long as you are uncomfortable or nauseous. °· Only take over-the-counter or prescription medicine as directed by your health care provider. °· Keep all follow-up appointments with your health care provider. °SEEK IMMEDIATE MEDICAL CARE IF: °· You are bleeding, leaking fluid, or passing tissue from the vagina. °· You have increasing pain or cramping. °· You have persistent vomiting. °· You have painful or bloody urination. °· You have a fever. °· You notice a decrease in your baby's movements. °· You have extreme weakness or feel faint. °· You have shortness of breath, with or without abdominal pain. °· You develop a severe headache with abdominal pain. °· You have abnormal vaginal discharge with abdominal pain. °· You have persistent diarrhea. °· You have abdominal pain that continues even after rest, or gets worse. °MAKE SURE YOU:  °· Understand these instructions. °· Will watch your condition. °· Will get help right away if you are not doing well or get  worse. °Document Released: 08/22/2005 Document Revised: 06/12/2013 Document Reviewed: 03/21/2013 °Exit[employer]® Patient Information ©2015 [employer]. This information is not intended to replace advice given to you by your health care provider. Make sure you discuss any questions you have with your health care provider. °Ectopic Pregnancy °An ectopic pregnancy is when the fertilized egg attaches (implants) outside the uterus. Most ectopic pregnancies occur in the fallopian tube. Rarely do ectopic pregnancies occur on the ovary, intestine, pelvis, or cervix. In an ectopic pregnancy, the fertilized egg does not have the ability to develop into a normal, healthy baby.  °A ruptured ectopic pregnancy is one in which the fallopian tube gets torn or bursts and results in internal bleeding. Often there is intense abdominal pain, and sometimes, vaginal bleeding. Having an ectopic pregnancy can be life threatening. If left untreated, this dangerous condition can lead to a blood transfusion, abdominal surgery, or even death. °CAUSES  °Damage to the fallopian tubes is the suspected cause in most ectopic pregnancies.  °RISK FACTORS °Depending on your circumstances, the risk of having an ectopic pregnancy will vary. The level of risk can be divided into three categories. °High Risk °· You have gone through infertility treatment. °· You have had a previous ectopic pregnancy. °· You have had previous tubal surgery. °· You have had previous surgery to have the fallopian tubes tied (tubal ligation). °· You have tubal problems or diseases. °· You have been exposed to DES. DES is a medicine that was used until 1971 and had effects on babies whose mothers took the medicine. °· You become pregnant while using   an intrauterine device (IUD) for birth control. Moderate Risk  You have a history of infertility.  You have a history of a sexually transmitted infection (STI).  You have a history of pelvic inflammatory disease (PID).  You  have scarring from endometriosis.  You have multiple sexual partners.  You smoke. Low Risk  You have had previous pelvic surgery.  You use vaginal douching.  You became sexually active before 26 years of age. SIGNS AND SYMPTOMS  An ectopic pregnancy should be suspected in anyone who has missed a period and has abdominal pain or bleeding.  You may experience normal pregnancy symptoms, such as:  Nausea.  Tiredness.  Breast tenderness.  Other symptoms may include:  Pain with intercourse.  Irregular vaginal bleeding or spotting.  Cramping or pain on one side or in the lower abdomen.  Fast heartbeat.  Passing out while having a bowel movement.  Symptoms of a ruptured ectopic pregnancy and internal bleeding may include:  Sudden, severe pain in the abdomen and pelvis.  Dizziness or fainting.  Pain in the shoulder area. DIAGNOSIS  Tests that may be performed include:  A pregnancy test.  An ultrasound test.  Testing the specific level of pregnancy hormone in the bloodstream.  Taking a sample of uterus tissue (dilation and curettage, D&C).  Surgery to perform a visual exam of the inside of the abdomen using a thin, lighted tube with a tiny camera on the end (laparoscope). TREATMENT  An injection of a medicine called methotrexate may be given. This medicine causes the pregnancy tissue to be absorbed. It is given if:  The diagnosis is made early.  The fallopian tube has not ruptured.  You are considered to be a good candidate for the medicine. Usually, pregnancy hormone blood levels are checked after methotrexate treatment. This is to be sure the medicine is effective. It may take 4-6 weeks for the pregnancy to be absorbed (though most pregnancies will be absorbed by 3 weeks). Surgical treatment may be needed. A laparoscope may be used to remove the pregnancy tissue. If severe internal bleeding occurs, a cut (incision) may be made in the lower abdomen (laparotomy),  and the ectopic pregnancy is removed. This stops the bleeding. Part of the fallopian tube, or the whole tube, may be removed as well (salpingectomy). After surgery, pregnancy hormone tests may be done to be sure there is no pregnancy tissue left. You may receive a Rho (D) immune globulin shot if you are Rh negative and the father is Rh positive, or if you do not know the Rh type of the father. This is to prevent problems with any future pregnancy. SEEK IMMEDIATE MEDICAL CARE IF:  You have any symptoms of an ectopic pregnancy. This is a medical emergency. MAKE SURE YOU:  Understand these instructions.  Will watch your condition.  Will get help right away if you are not doing well or get worse. Document Released: 09/29/2004 Document Revised: 01/06/2014 Document Reviewed: 03/21/2013 [facility] Patient Information 2015 [place], [place]. This information is not intended to replace advice given to you by your health care provider. Make sure you discuss any questions you have with your health care provider. Vaginitis Vaginitis is an inflammation of the vagina. It is most often caused by a change in the normal balance of the bacteria and yeast that live in the vagina. This change in balance causes an overgrowth of certain bacteria or yeast, which causes the inflammation. There are different types of vaginitis, but the most common types  are:  Bacterial vaginosis.  Yeast infection (candidiasis).  Trichomoniasis vaginitis. This is a sexually transmitted infection (STI).  Viral vaginitis.  Atropic vaginitis.  Allergic vaginitis. CAUSES  The cause depends on the type of vaginitis. Vaginitis can be caused by:  Bacteria (bacterial vaginosis).  Yeast (yeast infection).  A parasite (trichomoniasis vaginitis)  A virus (viral vaginitis).  Low hormone levels (atrophic vaginitis). Low hormone levels can occur during pregnancy, breastfeeding, or after menopause.  Irritants, such as bubble baths,  scented tampons, and feminine sprays (allergic vaginitis). Other factors can change the normal balance of the yeast and bacteria that live in the vagina. These include:  Antibiotic medicines.  Poor hygiene.  Diaphragms, vaginal sponges, spermicides, birth control pills, and intrauterine devices (IUD).  Sexual intercourse.  Infection.  Uncontrolled diabetes.  A weakened immune system. SYMPTOMS  Symptoms can vary depending on the cause of the vaginitis. Common symptoms include:  Abnormal vaginal discharge.  The discharge is white, gray, or yellow with bacterial vaginosis.  The discharge is thick, white, and cheesy with a yeast infection.  The discharge is frothy and yellow or greenish with trichomoniasis.  A bad vaginal odor.  The odor is fishy with bacterial vaginosis.  Vaginal itching, pain, or swelling.  Painful intercourse.  Pain or burning when urinating. Sometimes, there are no symptoms. TREATMENT  Treatment will vary depending on the type of infection.   Bacterial vaginosis and trichomoniasis are often treated with antibiotic creams or pills.  Yeast infections are often treated with antifungal medicines, such as vaginal creams or suppositories.  Viral vaginitis has no cure, but symptoms can be treated with medicines that relieve discomfort. Your sexual partner should be treated as well.  Atrophic vaginitis may be treated with an estrogen cream, pill, suppository, or vaginal ring. If vaginal dryness occurs, lubricants and moisturizing creams may help. You may be told to avoid scented soaps, sprays, or douches.  Allergic vaginitis treatment involves quitting the use of the product that is causing the problem. Vaginal creams can be used to treat the symptoms. HOME CARE INSTRUCTIONS   Take all medicines as directed by your caregiver.  Keep your genital area clean and dry. Avoid soap and only rinse the area with water.  Avoid douching. It can remove the healthy  bacteria in the vagina.  Do not use tampons or have sexual intercourse until your vaginitis has been treated. Use sanitary pads while you have vaginitis.  Wipe from front to back. This avoids the spread of bacteria from the rectum to the vagina.  Let air reach your genital area.  Wear cotton underwear to decrease moisture buildup.  Avoid wearing underwear while you sleep until your vaginitis is gone.  Avoid tight pants and underwear or nylons without a cotton panel.  Take off wet clothing (especially bathing suits) as soon as possible.  Use mild, non-scented products. Avoid using irritants, such as:  Scented feminine sprays.  Fabric softeners.  Scented detergents.  Scented tampons.  Scented soaps or bubble baths.  Practice safe sex and use condoms. Condoms may prevent the spread of trichomoniasis and viral vaginitis. SEEK MEDICAL CARE IF:   You have abdominal pain.  You have a fever or persistent symptoms for more than 2-3 days.  You have a fever and your symptoms suddenly get worse. Document Released: 06/19/2007 Document Revised: 05/16/2012 Document Reviewed: 02/02/2012 [facility] Patient Information 2015 [place], [place]. This information is not intended to replace advice given to you by your health care provider. Make sure you  discuss any questions you have with your health care provider. ° °

## 2015-02-26 NOTE — ED Provider Notes (Signed)
CSN: [id number]     Arrival date & time 02/26/15  7829 History   First MD Initiated Contact with Patient 02/26/15 1131     Chief Complaint  Patient presents with  . Pelvic Pain   (Consider location/radiation/quality/duration/timing/severity/associated sxs/prior Treatment) HPI Comments: Caucasian female here for new abdomen pain irregular menses so took pregnancy test at home positive 3+ weeks.  Slight vaginal discharge white noted with wiping and dysuria end of stream.  The most abdomen pain with lying flat on back.  Least pain if curling up into fetal position/ball.  Denied fever, chills, nausea, vomiting, diarrhea, hematuria, headache, rash, swelling of hands/feet, chest pain, wheezing.  Patient is a 24 y.o. female presenting with pelvic pain. The history is provided by the patient and a relative.  Pelvic Pain This is a new problem. The current episode started 12 to 24 hours ago. The problem occurs constantly. The problem has not changed since onset.Associated symptoms include abdominal pain. Pertinent negatives include no chest pain, no headaches and no shortness of breath. The symptoms are aggravated by exertion, bending, walking and twisting. Nothing (curling up into fetal position most comfortable position) relieves the symptoms. She has tried rest, food and water for the symptoms. The treatment provided mild relief.    Past Medical History  Diagnosis Date  . Migraine with aura, intractable, with status migrainosus 1995  . Frequent headaches   . Irregular menses    Past Surgical History  Procedure Laterality Date  . Wisdom tooth extraction  2012   Family History  Problem Relation Age of Onset  . Diabetes Maternal Grandmother   . Heart disease Maternal Grandmother   . Diabetes Maternal Grandfather   . Heart disease Maternal Grandfather   . Diabetes Mother   . Diabetes Sister    History  Substance Use Topics  . Smoking status: Light Tobacco Smoker -- 0.25 packs/day    Types:  Cigarettes  . Smokeless tobacco: Not on file  . Alcohol Use: 0.6 oz/week    1 Glasses of wine per week   OB History    Gravida Para Term Preterm AB TAB SAB Ectopic Multiple Living   0              Review of Systems  Constitutional: Negative for fever, chills, diaphoresis, activity change, appetite change and fatigue.  HENT: Negative for congestion, dental problem, drooling, facial swelling and mouth sores.   Eyes: Negative for photophobia, pain, discharge, redness, itching and visual disturbance.  Respiratory: Negative for cough, choking, chest tightness, shortness of breath, wheezing and stridor.   Cardiovascular: Negative for chest pain.  Gastrointestinal: Positive for abdominal pain. Negative for nausea, vomiting, diarrhea, constipation, blood in stool, abdominal distention, anal bleeding and rectal pain.  Endocrine: Negative for cold intolerance and heat intolerance.  Genitourinary: Positive for dysuria, vaginal discharge, menstrual problem and pelvic pain. Negative for urgency, frequency, hematuria, flank pain, decreased urine volume, vaginal bleeding, enuresis, difficulty urinating, genital sores and vaginal pain.  Musculoskeletal: Negative for myalgias, back pain, joint swelling, arthralgias, gait problem, neck pain and neck stiffness.  Skin: Negative for color change, pallor, rash and wound.  Allergic/Immunologic: Negative for environmental allergies and food allergies.  Neurological: Negative for dizziness, tremors, seizures, syncope, facial asymmetry, speech difficulty, weakness, light-headedness, numbness and headaches.  Hematological: Negative for adenopathy. Does not bruise/bleed easily.  Psychiatric/Behavioral: Negative for behavioral problems, confusion, sleep disturbance and agitation.    Allergies  Review of patient's allergies indicates no known allergies.  Home Medications  Prior to Admission medications   Medication Sig Start Date End Date Taking? Authorizing  Provider  aspirin-acetaminophen-caffeine (EXCEDRIN MIGRAINE) [phone] MG per tablet Take 1 tablet by mouth every 6 (six) hours as needed for pain.    Historical Provider, MD  propranolol (INDERAL) 40 MG tablet Take 1 tablet (40 mg total) by mouth 2 (two) times daily. 12/12/13   Raquel Dagoberto Ligas, NP  SUMAtriptan (IMITREX) 100 MG tablet TAKE 1 TABLET BY MOUTH AS NEEDED MAY REPEAT IN 2 HOURS IF HEADACHE PERSISTS OR RECURS. 03/14/14   Raquel M Rey, NP   BP 128/81 mmHg  Pulse 73  Temp(Src) 98.2 F (36.8 C) (Tympanic)  Resp 16  Ht 5\' 8"  (1.727 m)  Wt 185 lb (83.915 kg)  BMI 28.14 kg/m2  SpO2 100%  LMP 07/18/2014 (Exact Date) Physical Exam  Constitutional: She is oriented to person, place, and time. Vital signs are normal. She appears well-developed and well-nourished. No distress.  HENT:  Head: Normocephalic and atraumatic.  Right Ear: External ear normal.  Left Ear: External ear normal.  Nose: Nose normal.  Mouth/Throat: Oropharynx is clear and moist. No oropharyngeal exudate.  Eyes: Conjunctivae, EOM and lids are normal. Pupils are equal, round, and reactive to light. Right eye exhibits no discharge. Left eye exhibits no discharge. No scleral icterus.  Neck: Trachea normal and normal range of motion. Neck supple. No tracheal deviation present. No thyromegaly present.  Cardiovascular: Normal rate, regular rhythm, normal heart sounds and intact distal pulses.  Exam reveals no gallop and no friction rub.   No murmur heard. Pulmonary/Chest: Effort normal and breath sounds normal. No stridor. No respiratory distress. She has no wheezes. She has no rales. She exhibits no tenderness.  Abdominal: Soft. Bowel sounds are normal. She exhibits no shifting dullness, no distension, no pulsatile liver, no fluid wave, no abdominal bruit, no ascites, no pulsatile midline mass and no mass. There is generalized tenderness. There is no rigidity, no rebound, no guarding, no CVA tenderness, no tenderness at McBurney's  point and negative Murphy's sign. Hernia confirmed negative in the right inguinal area and confirmed negative in the left inguinal area.  Dull to percussion x 4 quads  Genitourinary: Rectal exam shows no external hemorrhoid and no fissure. Pelvic exam was performed with patient supine. No labial fusion. There is no rash, tenderness, lesion or injury on the right labia. There is no rash, tenderness, lesion or injury on the left labia. Uterus is tender. Uterus is not deviated, not enlarged and not fixed. Cervix exhibits motion tenderness. Cervix exhibits no discharge and no friability. Right adnexum displays tenderness. Right adnexum displays no mass and no fullness. Left adnexum displays tenderness. Left adnexum displays no mass and no fullness. There is tenderness in the vagina. No erythema or bleeding in the vagina. No foreign body around the vagina. No signs of injury around the vagina. Vaginal discharge found.  RN Raylene Miyamoto chaperoned pelvic exam; white/grey thin discharge coating vaginal walls slightly fishy odor  Musculoskeletal: Normal range of motion. She exhibits no edema or tenderness.  Lymphadenopathy:    She has no cervical adenopathy.       Right: No inguinal adenopathy present.       Left: No inguinal adenopathy present.  Neurological: She is alert and oriented to person, place, and time. No cranial nerve deficit. She exhibits normal muscle tone. Coordination normal.  Skin: Skin is warm, dry and intact. No rash noted. She is not diaphoretic. No erythema. No pallor.  Psychiatric: She  has a normal mood and affect. Her speech is normal and behavior is normal. Judgment and thought content normal. Cognition and memory are normal.  Nursing note and vitals reviewed.   ED Course  Procedures (including critical care time) Labs Review Labs Reviewed  WET PREP, GENITAL - Abnormal; Notable for the following:    WBC, Wet Prep HPF POC MODERATE (*)    All other components within normal limits   HCG, QUANTITATIVE, PREGNANCY - Abnormal; Notable for the following:    hCG, Beta Chain, Quant, S [id number] (*)    All other components within normal limits  URINALYSIS COMPLETEWITH MICROSCOPIC ([facility] ONLY) - Abnormal; Notable for the following:    Squamous Epithelial / LPF 6-30 (*)    All other components within normal limits  URINE CULTURE    Imaging Review No results found.   MDM   1. Pregnancy   2. Vaginal discharge during pregnancy in first trimester   3. Generalized abdominal pain    To [facility] radiology for [place] r/o ectopic pregnancy now.  DIscussed patient to have full bladder given two cups of water.  Abdomen pain could be enlarging uterus, ovarian cyst, viral illness.  Notified serum Hcg showed 4-[redacted] weeks pregnant pending [place] for EDC.  Patient given copy of all lab results.  Patient to start OTC prenatals of choice.  Discussed to start prenatal vitamin po daily when eating meal.  Avoid any medication intake without first discussing with PCM/OB.  Discussed pregnancy classifications of medications A-D,X.  Discussed with patient aspirin, caffeine not recommended in pregnancy and discuss sumatriptan use for headaches with OB at first appt also. Patient currently does not have any sumatriptan at home.  Discussed low dose aspirin can be used in pregnancy but not recommended until discussed with OB provider. Discussed to follow up immediately with provider if vaginal bleeding, fever >100.5 or abdomen pain worsening, hematemesis.  Nurse gave exitcare handout on pregnancy.  Discussed morning sickness treatments unisom with B6, zofran, phenergan and/or ginger ale.  Eating crackers before rising in am.  Avoiding liquids or separating them from solids.  Ginger ale.  discussed if vomiting hold all po until stopped then restart fluids and if tolerate add bland solids avoid dairy, fried, spicy or meat until tolerating bland diet.  Patient scheduled OB appt initial Monday 02 Mar 2015 with [facility] in [place],  [place].  Patient given copy of all labs completed today and discussed results with her.  Vaginitis negative for yeast or clue cells.  Schedule f/u with OB between 8-12 weeks.  Told CDC.gov and ACOG.org websites have pregnancy information also.  Patient and mother verbalized understanding of information/instructions, agreed with plan of care and had no further questions at this time.  Attempted to notify patient wet read [place] [redacted] weeks pregnant no ovarian cyst or ectopic pregnancy via telephone.  Discomfort probably related to round ligament pain uterine enlargement.  Message left for patient to contact clinic for [place] results.  Olen Cordial, NP 02/26/15 1513  Reviewed full [place] report no ovarian cysts/ectopic pregnancy.  Estimated gestational age [redacted] weeks 4 days EDC 28 Jun 2015 estimated LMP 21 Sep 2014 based on [place] measurements.  Olen Cordial, NP 02/27/15 [phone]

## 2015-02-27 ENCOUNTER — Ambulatory Visit: Payer: Self-pay

## 2015-02-28 LAB — URINE CULTURE: Special Requests: NORMAL

## 2015-03-02 ENCOUNTER — Ambulatory Visit: Payer: Self-pay | Admitting: *Deleted

## 2015-03-02 VITALS — BP 114/68 | HR 88 | Wt 191.9 lb

## 2015-03-02 DIAGNOSIS — Z3492 Encounter for supervision of normal pregnancy, unspecified, second trimester: Secondary | ICD-10-CM

## 2015-03-02 NOTE — Progress Notes (Signed)
Pt is here for NOB nurse intake, went to ER 02/26/2015 for R side pain, found out she was pregnant, pt was given NOB info, will schedule pt for labs and anatomy US,pt voiced understanding And all questions answered

## 2015-03-03 ENCOUNTER — Ambulatory Visit: Payer: Self-pay

## 2015-03-03 ENCOUNTER — Other Ambulatory Visit: Payer: Self-pay | Admitting: *Deleted

## 2015-03-03 DIAGNOSIS — Z3491 Encounter for supervision of normal pregnancy, unspecified, first trimester: Secondary | ICD-10-CM

## 2015-03-06 ENCOUNTER — Encounter: Payer: Self-pay | Admitting: Obstetrics and Gynecology

## 2015-03-06 ENCOUNTER — Ambulatory Visit (INDEPENDENT_AMBULATORY_CARE_PROVIDER_SITE_OTHER): Payer: Self-pay | Admitting: Obstetrics and Gynecology

## 2015-03-06 VITALS — BP 109/67 | HR 90 | Wt 189.9 lb

## 2015-03-06 DIAGNOSIS — Z3492 Encounter for supervision of normal pregnancy, unspecified, second trimester: Secondary | ICD-10-CM

## 2015-03-06 DIAGNOSIS — G43719 Chronic migraine without aura, intractable, without status migrainosus: Secondary | ICD-10-CM

## 2015-03-06 LAB — POCT URINALYSIS DIPSTICK
Bilirubin, UA: NEGATIVE
Glucose, UA: NEGATIVE
KETONES UA: NEGATIVE
LEUKOCYTES UA: NEGATIVE
Nitrite, UA: NEGATIVE
RBC UA: NEGATIVE
Spec Grav, UA: 1.02
Urobilinogen, UA: 0.2
pH, UA: 6.5

## 2015-03-06 MED ORDER — BUTALBITAL-APAP-CAFFEINE 50-500-40 MG PO TABS: 1.0000 | ORAL_TABLET | ORAL | Status: DC | PRN

## 2015-03-06 MED ORDER — ZOLPIDEM TARTRATE 5 MG PO TABS: 5.0000 mg | ORAL_TABLET | Freq: Every evening | ORAL | Status: DC | PRN

## 2015-03-06 NOTE — Progress Notes (Signed)
Pt is c/o headaches, not relieved by Tylenol

## 2015-03-06 NOTE — Progress Notes (Signed)
New OB counseling: The patient has been given an overview regarding routine prenatal care. Recommendations regarding diet, weight gain, and exercise in pregnancy were given. Prenatal testing, optional genetic testing, and ultrasound use in pregnancy were reviewed.  Benefits of Breast Feeding were discussed. The patient is encouraged to consider nursing her baby post partum. NEW OB HISTORY AND PHYSICAL  SUBJECTIVE:       Jennifer Morse is a 24 y.o. G1P0 female, Patient's last menstrual period was 09/21/2014 (lmp unknown)., Estimated Date of Delivery: 06/28/15, [redacted]w[redacted]d, presents today for establishment of Prenatal Care. 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 Had not had menses since Nov 2015 so did not know she was pregnant. Married last month. Both are happy about unplanned pregnancy.     Gynecologic History Patient's last menstrual period was 09/21/2014 (lmp unknown). Unknown Contraception: none Last Pap: not asked. Results were: normal  Obstetric History OB History  Gravida Para Term Preterm AB SAB TAB Ectopic Multiple Living  1             # Outcome Date GA Lbr Len/2nd Weight Sex Delivery Anes PTL Lv  1 Current               Past Medical History  Diagnosis Date  . Migraine with aura, intractable, with status migrainosus 1995  . Frequent headaches   . Irregular menses   . Irregular periods     Past Surgical History  Procedure Laterality Date  . Wisdom tooth extraction  2012    Current Outpatient Prescriptions on File Prior to Visit  Medication Sig Dispense Refill  . Prenatal Vit-Fe Fumarate-FA (PRENATAL MULTIVITAMIN) TABS tablet Take 1 tablet by mouth daily at 12 noon.    . SUMAtriptan (IMITREX) 100 MG tablet TAKE 1 TABLET BY MOUTH AS NEEDED MAY REPEAT IN 2 HOURS IF HEADACHE PERSISTS OR RECURS. (Patient not taking: Reported on 03/02/2015) 10 tablet 0   No current facility-administered  medications on file prior to visit.    No Known Allergies  History   Social History  . Marital Status: Married    Spouse Name: N/A  . Number of Children: N/A  . Years of Education: N/A   Occupational History  . Not on file.   Social History Main Topics  . Smoking status: Light Tobacco Smoker -- 0.25 packs/day    Types: Cigarettes  . Smokeless tobacco: Never Used  . Alcohol Use: 0.6 oz/week    1 Glasses of wine per week  . Drug Use: No  . Sexual Activity:    Partners: Male    Birth Control/ Protection: None   Other Topics Concern  . Not on file   Social History Narrative    Family History  Problem Relation Age of Onset  . Diabetes Maternal Grandmother   . Heart disease Maternal Grandmother   . Diabetes Maternal Grandfather   . Heart disease Maternal Grandfather   . Diabetes Mother   . Diabetes Sister     The following portions of the patient's history were reviewed and updated as appropriate: allergies, current medications, past OB history, past medical history, past surgical history, past family history, past social history, and problem list.    OBJECTIVE: Initial Physical Exam (New OB)  GENERAL APPEARANCE: alert, well appearing, oriented to person, place and time, overweight HEAD: normocephalic, atraumatic MOUTH: mucous membranes moist, pharynx normal without lesions THYROID: no thyromegaly or masses  present BREASTS: no masses noted, no significant tenderness, no palpable axillary nodes, no skin changes LUNGS: clear to auscultation, no wheezes, rales or rhonchi, symmetric air entry HEART: regular rate and rhythm, no murmurs ABDOMEN: fundus soft, nontender 24 cm and FHT present EXTREMITIES: no redness or tenderness in the calves or thighs SKIN: normal coloration and turgor, no rashes LYMPH NODES: no adenopathy palpable NEUROLOGIC: alert, oriented, normal speech, no focal findings or movement disorder noted  PELVIC EXAM UTERUS: gravid and consistent with  24 weeks  ASSESSMENT: Normal pregnancy, with late entry to care. Migraines, unrelieved with tylenol Sleep disturbance secondary to migraines and school stressors   PLAN: Prenatal care See orders

## 2015-03-06 NOTE — Patient Instructions (Signed)
  Place 24-38 weeks prenatal visit patient instructions here.

## 2015-03-07 LAB — CBC
HEMATOCRIT: 35 % (ref 34.0–46.6)
Hemoglobin: 11.7 g/dL (ref 11.1–15.9)
MCH: 29.7 pg (ref 26.6–33.0)
MCHC: 33.4 g/dL (ref 31.5–35.7)
MCV: 89 fL (ref 79–97)
Platelets: 199 10*3/uL (ref 150–379)
RBC: 3.94 x10E6/uL (ref 3.77–5.28)
RDW: 14 % (ref 12.3–15.4)
WBC: 9 10*3/uL (ref 3.4–10.8)

## 2015-03-07 LAB — HIV ANTIBODY (ROUTINE TESTING W REFLEX): HIV Screen 4th Generation wRfx: NONREACTIVE

## 2015-03-07 LAB — RPR: RPR Ser Ql: NONREACTIVE

## 2015-03-07 LAB — RUBELLA SCREEN: Rubella Antibodies, IGG: <0.9 index — ABNORMAL LOW (ref >0.99)

## 2015-03-07 LAB — ABO

## 2015-03-07 LAB — HEPATITIS B SURFACE ANTIGEN: Hepatitis B Surface Ag: NEGATIVE

## 2015-03-07 LAB — ANTIBODY SCREEN: Antibody Screen: NEGATIVE

## 2015-03-10 ENCOUNTER — Encounter: Payer: Self-pay | Admitting: Obstetrics and Gynecology

## 2015-03-10 LAB — VARICELLA ZOSTER ABS, IGG/IGM

## 2015-03-11 LAB — GC/CHLAMYDIA PROBE AMP
Chlamydia trachomatis, NAA: NEGATIVE
Neisseria gonorrhoeae by PCR: NEGATIVE

## 2015-04-02 ENCOUNTER — Encounter: Payer: Self-pay | Admitting: Obstetrics and Gynecology

## 2015-04-03 ENCOUNTER — Encounter: Payer: Self-pay | Admitting: Obstetrics and Gynecology

## 2015-04-09 ENCOUNTER — Ambulatory Visit (INDEPENDENT_AMBULATORY_CARE_PROVIDER_SITE_OTHER): Payer: 59 | Admitting: Obstetrics and Gynecology

## 2015-04-09 ENCOUNTER — Encounter: Payer: Self-pay | Admitting: Obstetrics and Gynecology

## 2015-04-09 VITALS — BP 112/67 | HR 88 | Wt 199.5 lb

## 2015-04-09 DIAGNOSIS — O26893 Other specified pregnancy related conditions, third trimester: Secondary | ICD-10-CM

## 2015-04-09 DIAGNOSIS — M545 Low back pain, unspecified: Secondary | ICD-10-CM

## 2015-04-09 DIAGNOSIS — Z3493 Encounter for supervision of normal pregnancy, unspecified, third trimester: Secondary | ICD-10-CM

## 2015-04-09 DIAGNOSIS — O2693 Pregnancy related conditions, unspecified, third trimester: Secondary | ICD-10-CM

## 2015-04-09 DIAGNOSIS — Z131 Encounter for screening for diabetes mellitus: Secondary | ICD-10-CM

## 2015-04-09 MED ORDER — TETANUS-DIPHTH-ACELL PERTUSSIS 5-2.5-18.5 LF-MCG/0.5 IM SUSP
0.5000 mL | Freq: Once | INTRAMUSCULAR | Status: AC
  Administered 2015-04-09: 0.5 mL via INTRAMUSCULAR

## 2015-04-09 NOTE — Progress Notes (Signed)
ROB & glucola- Tdap given- will check with spouse about getting shot; blood consent signed; gave schedule to enroll in CBC, breastfeeding class, and infant care class; will refer to PT for back pain (injured low back a year ago)

## 2015-04-09 NOTE — Progress Notes (Signed)
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

## 2015-04-10 LAB — HEMOGLOBIN AND HEMATOCRIT, BLOOD
Hematocrit: 34.6 % (ref 34.0–46.6)
Hemoglobin: 11.7 g/dL (ref 11.1–15.9)

## 2015-04-13 LAB — GLUCOSE, 1 HOUR GESTATIONAL

## 2015-04-15 ENCOUNTER — Other Ambulatory Visit: Payer: 59

## 2015-04-15 ENCOUNTER — Other Ambulatory Visit: Payer: Self-pay

## 2015-04-15 ENCOUNTER — Other Ambulatory Visit: Payer: Self-pay | Admitting: *Deleted

## 2015-04-15 DIAGNOSIS — Z3493 Encounter for supervision of normal pregnancy, unspecified, third trimester: Secondary | ICD-10-CM

## 2015-04-15 DIAGNOSIS — Z131 Encounter for screening for diabetes mellitus: Secondary | ICD-10-CM

## 2015-04-16 ENCOUNTER — Telehealth: Payer: Self-pay | Admitting: *Deleted

## 2015-04-16 LAB — GLUCOSE, 1 HOUR GESTATIONAL: Gestational Diabetes Screen: 116 mg/dL (ref 65–139)

## 2015-04-16 NOTE — Telephone Encounter (Signed)
Notified pt of results 

## 2015-04-16 NOTE — Telephone Encounter (Signed)
-----   Message from Evonnie Pat, [place] sent at 04/16/2015 11:12 AM EDT ----- Please let her know she passed her glucola

## 2015-04-16 NOTE — Telephone Encounter (Signed)
Notified pt of normal lab results

## 2015-04-21 ENCOUNTER — Ambulatory Visit: Payer: 59 | Attending: Obstetrics and Gynecology | Admitting: Physical Therapy

## 2015-04-21 ENCOUNTER — Encounter: Payer: Self-pay | Admitting: Physical Therapy

## 2015-04-21 VITALS — BP 100/60

## 2015-04-21 DIAGNOSIS — R531 Weakness: Secondary | ICD-10-CM | POA: Diagnosis present

## 2015-04-21 DIAGNOSIS — R279 Unspecified lack of coordination: Secondary | ICD-10-CM | POA: Diagnosis present

## 2015-04-21 NOTE — Patient Instructions (Addendum)
Log rolling handout  Clam shells 10 reps , 3 sets, morning at night Pelvic tilts sidelying, and standing  Avoid locking knees when standing and place feet wider   Breathe as you roll in bed (knees together)  Breathe out when lifting trays, using wider squat position

## 2015-04-22 NOTE — Therapy (Signed)
[place] [facility] SERVICES [address] [place], [place], [address] Phone: [phone]   Fax:  [phone]  Physical Therapy Evaluation  Patient Details  Name: Jennifer Morse MRN: [id number] Date of Birth: 1991-04-16 Referring Provider:  Evonnie Pat, CNM  Encounter Date: 04/21/2015      PT End of Session - 04/22/15 1457    Visit Number 1   Number of Visits 12   Date for PT Re-Evaluation 07/22/15   PT Start Time 0900   PT Stop Time 1000   PT Time Calculation (min) 60 min   Equipment Utilized During Treatment Other (comment)  SIJ belt   Activity Tolerance Patient tolerated treatment well;No increased pain   Behavior During Therapy [facility] for tasks assessed/performed      Past Medical History  Diagnosis Date  . Migraine with aura, intractable, with status migrainosus 1995  . Frequent headaches   . Irregular menses   . Irregular periods     Past Surgical History  Procedure Laterality Date  . Wisdom tooth extraction  2012    Filed Vitals:   04/21/15 0912  BP: 100/60    Visit Diagnosis:  Weakness - Plan: PT plan of care cert/re-cert  Lack of coordination - Plan: PT plan of care cert/re-cert      Subjective Assessment - 04/21/15 0915    Subjective Pt is [redacted] week pregnant today w/ her 1st pregnancy and has been experiencing anterior pelvic pain and SIJ  for the past 3 weeks.  Pt has difficulty with walking and feeling her belly "bouncing", strain to lift something, rolling side to side , and supine to EOB.  Pain varies from 8/10 and is worse after working long days and persists into the next day.  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 Pt is able ask for help for lifting patients as a Engineer, manufacturing but as Educational psychologist,     Pertinent History Pt had hurt her back a 36-2 yo ago from a work injury from handling a pt and received  chiropractic care without success. Her LBP resolved after 4 months when she swung a baseball bat. Pt exercised from Nov 2015- May 2016 (walking, jogging, stairclimbing)    Patient Stated Goals pain relief <5/10    Currently in Pain? Yes   Pain Score 5    Pain Location Pelvis   Pain Orientation Anterior   Pain Descriptors / Indicators Aching   Pain Onset More than a month ago   Aggravating Factors  Pt has difficulty with walking and feeling her belly "bouncing", strain to lift something, rolling side to side , and supine to EOB.            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, narrow BOS, breathholding   able to demo BOS and exhaling w/ lift   Posture/Postural Control   Posture Comments hyperextended knees, feet together in stance  AROM   Overall AROM  --  [facility]   Strength   Overall Strength --  bil hip abd 4/5/    Flexibility   Piriformis restricted Bilaterally, with pain   Palpation   SI assessment  tenderness at L/S   tenderness at pubic symphysis   Palpation comment manual compression for force closure provided "some relief"   SIJ belt provided anterior pelvic pain relief   Bed Mobility   Bed Mobility --  half crunch technique, able to log roll   Rolling Right --  pain with rolling w/ hips ER/abd                   [facility] Adult PT Treatment/Exercise - 04/22/15 1443    Therapeutic Activites    Therapeutic Activities --  exhale/lift, wider stance: simulated task as waitress 5 reps   Neuro Re-ed    Neuro Re-ed Details  --  standing posture with pelvic tilt, knees unlocked    Exercises   Other Exercises  clam shells 10 x bil w. deepc ore engaged   Manual Therapy   Other Manual Therapy inferior glide of sacrum, pelvic tilt                 PT Education - 04/22/15 1456    Education provided Yes   Education Details HEP, POC, anatomy/physiology, bodymechanics   Person(s) Educated Patient   Methods Explanation;Demonstration;Tactile cues;Verbal cues;Handout   Comprehension Verbalized understanding             PT Long Term Goals - 04/22/15 1501    PT LONG TERM GOAL #1   Title Pt will decrease her score on PGQ from 57% to < 25% to increase particiaption in ADLs.    Time 12   Period Weeks   Status New   PT LONG TERM GOAL #2   Title Pt will decrease her FSS score from 48% to < 25% in order to have increased energy conservation for home activity after working long hours and being on her feet al day.    Time 12   Period Weeks   Status New   PT LONG TERM GOAL #3   Title Pt will be compliant with HEP in order to achieve self-management of pain.    Time 12   Period Weeks   Status New               Plan - 04/22/15 1458    Clinical Impression Statement Pt is a 24 yo pregnant female whose S & Sx consist of pregnancy-related pelvic girdle pain, poor standing posture, decreased hip and deep core strength, and poor body mechanics with lifting. These deficits impact her functional mobility/ability at work and home.    Pt will benefit from skilled therapeutic intervention in order to improve on the following deficits Abnormal gait;Decreased mobility;Decreased strength;Postural dysfunction;Improper body mechanics;Pain;Decreased endurance;Decreased range of motion;Decreased coordination;Hypomobility;Impaired flexibility;Decreased balance;Decreased activity tolerance   Rehab Potential Good   PT Frequency 2x / week   PT Duration 12 weeks   PT Treatment/Interventions ADLs/Self Care Home Management;Functional mobility training;Stair training;Gait  training;Biofeedback;Moist Heat;Therapeutic activities;Therapeutic exercise;Cryotherapy;Balance training;Neuromuscular re-education;Patient/family education;Passive range of motion;Manual techniques;Dry needling;Energy conservation         Problem List Patient Active Problem List   Diagnosis Date Noted  . Migraine 12/13/2013    [name]  ,PT, DPT, E-RYT   04/22/2015, 4:15 PM  [place] [facility] SERVICES [address] [place], [place], [address] Phone: [phone]  Fax:  [phone]

## 2015-04-24 ENCOUNTER — Encounter: Payer: Self-pay | Admitting: Obstetrics and Gynecology

## 2015-04-24 ENCOUNTER — Ambulatory Visit (INDEPENDENT_AMBULATORY_CARE_PROVIDER_SITE_OTHER): Payer: 59 | Admitting: Obstetrics and Gynecology

## 2015-04-24 VITALS — BP 106/66 | HR 83 | Wt 201.5 lb

## 2015-04-24 DIAGNOSIS — Z3493 Encounter for supervision of normal pregnancy, unspecified, third trimester: Secondary | ICD-10-CM

## 2015-04-24 LAB — POCT URINALYSIS DIPSTICK
GLUCOSE UA: NEGATIVE
Ketones, UA: 5
Leukocytes, UA: NEGATIVE
NITRITE UA: NEGATIVE
RBC UA: NEGATIVE
Spec Grav, UA: 1.015
Urobilinogen, UA: 0.2
pH, UA: 6

## 2015-04-24 NOTE — Progress Notes (Signed)
ROB-some dizzy spells, is feeling better

## 2015-04-24 NOTE — Progress Notes (Signed)
[email or web address]  Needs email for travel agent.  ROB- considering [facility] PP, as desires another pregnancy w/n 2 years.  PT helping, and wearing support belt.

## 2015-04-27 ENCOUNTER — Telehealth: Payer: Self-pay | Admitting: Obstetrics and Gynecology

## 2015-04-27 NOTE — Telephone Encounter (Signed)
She stated that you were going to write a letter for when she had to cancel her trip, her travel agent has emailed her about it. She just wanted to know when she might can get that.

## 2015-04-28 ENCOUNTER — Ambulatory Visit: Payer: 59 | Admitting: Physical Therapy

## 2015-04-28 VITALS — BP 100/60

## 2015-04-28 DIAGNOSIS — R279 Unspecified lack of coordination: Secondary | ICD-10-CM

## 2015-04-28 DIAGNOSIS — R531 Weakness: Secondary | ICD-10-CM | POA: Diagnosis not present

## 2015-04-28 NOTE — Telephone Encounter (Signed)
Has this been done yet?

## 2015-04-28 NOTE — Telephone Encounter (Signed)
Left pt message letter was ready, i could fax if she would get me a number

## 2015-04-28 NOTE — Patient Instructions (Signed)
PELVIC FLOOR / KEGEL EXERCISES   Pelvic floor/ Kegel exercises are used to strengthen the muscles in the base of your pelvis that are responsible for supporting your pelvic organs and preventing urine/feces leakage. Based on your therapist's recommendations, they can be performed while standing, sitting, or lying down. Imagine pelvic floor area as a diamond with pelvic landmarks: top =pubic bone, bottom tip=tailbone, sides=sitting bones (ischial tuberosities).    Make yourself aware of this muscle group by using these cues while coordinating your breath:  Inhale, feel pelvic floor diamond area lower like hammock towards your feet and ribcage/belly expanding. Pause. Let the exhale naturally and feel your belly sink, abdominal muscles hugging in around you and you may notice the pelvic diamond draws upward towards your head forming a umbrella shape. Give a squeeze during the exhalation like you are stopping the flow of urine. If you are squeezing the buttock muscles, try to give 50% less effort.   Common Errors:  Breath holding: If you are holding your breath, you may be bearing down against your bladder instead of pulling it up. If you belly bulges up while you are squeezing, you are holding your breath. Be sure to breathe gently in and out while exercising. Counting out loud may help you avoid holding your breath.  Accessory muscle use: You should not see or feel other muscle movement when performing pelvic floor exercises. When done properly, no one can tell that you are performing the exercises. Keep the buttocks, belly and inner thighs relaxed.  Overdoing it: Your muscles can fatigue and stop working for you if you over-exercise. You may actually leak more or feel soreness at the lower abdomen or rectum.  YOUR HOME EXERCISE PROGRAM  LONG HOLDS: Position: on side w/ pillow between knees  Inhale and then exhale. Then squeeze the muscle and count aloud for 5 seconds. Rest with three long  breaths. (Be sure to let belly sink in with exhales and not push outward)  Perform 5 repetitions, 3 times/day                   DECREASE DOWNWARD PRESSURE ON  YOUR PELVIC FLOOR, ABDOMINAL, LOW BACK MUSCLES       PRESERVE YOUR PELVIC HEALTH LONG-TERM   ** SQUEEZE pelvic floor BEFORE YOUR SNEEZE, COUGH, LAUGH   ** EXHALE BEFORE YOU RISE AGAINST GRAVITY (lifting, sit to stand, from squat to stand)   ** LOG ROLL OUT OF BED INSTEAD OF CRUNCH/SIT-UP    _____________________________________________________________________  Practice body scan nightly 2-3 cycles. Eventually you can practice when sitting with eyes open when you need to feel calm/re-centered  _____________________________________________________________________  Look into belt with upper strap   Massage upward strokes from low belly to side to ease frontal weight  pressure  5 strokes at bedtime

## 2015-04-29 NOTE — Therapy (Signed)
[place] MAIN [facility] SERVICES [address] [place], [place], [address] Phone: [phone]   Fax:  [phone]  Physical Therapy Treatment  Patient Details  Name: Jennifer Morse MRN: [id number] Date of Birth: 03-30-1991 Referring Provider:  Evonnie Pat, CNM  Encounter Date: 04/28/2015      PT End of Session - 04/28/15 0948    Visit Number 2   Number of Visits 12   Date for PT Re-Evaluation 07/22/15   PT Start Time 0905   PT Stop Time 1000   PT Time Calculation (min) 55 min   Equipment Utilized During Treatment Other (comment)  SIJ belt   Activity Tolerance Patient tolerated treatment well;No increased pain   Behavior During Therapy [facility] for tasks assessed/performed      Past Medical History  Diagnosis Date  . Migraine with aura, intractable, with status migrainosus 1995  . Frequent headaches   . Irregular menses   . Irregular periods     Past Surgical History  Procedure Laterality Date  . 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 Pt has been performing her HEP. Pt is feeling restless at night with discomfort, has to urinate or feels thirsty.  After last session, she felt better after each work day and found thebelt to help as well. Her back feels better than before by 70%, but  the anterior pelvic pain feels like a 5/10 today.     Pertinent History Pt had hurt her back a 26-2 yo ago from a work injury from handling a pt and received chiropractic care without success. Her LBP resolved after 4 months when she swung a baseball bat. Pt exercised from Nov 2015- May 2016 (walking, jogging, stairclimbing)    Patient Stated Goals pain relief <5/10    Currently in Pain? Yes   Pain Score 3    Pain Location Pelvis                         [facility] Adult PT Treatment/Exercise - 04/29/15 1332    Self-Care   Self-Care --  body scan 2 cycles (1 cycle guided)   Neuro Re-ed    Neuro Re-ed Details  Pelvic floor 5 sec holds, 10 reps.    Exercises   Other Exercises  R SKC to chest with Towel 1- reps   Modalities   Modalities Moist Heat   Moist Heat Therapy   Moist Heat Location Lumbar Spine  skin intact post-Tx   Manual Therapy   Soft tissue mobilization increased tensions distal attachment of QL, applied sustained pressure and effleurage   educated pt on self-massage on LQ broad strokes up ASIS   Other Manual Therapy MWM SKC AAROM w/ towel rotational mob                PT Education - 04/28/15 0947    Education provided Yes   Education Details HEP, releaxation practices to implement after work, and before bed to minimize restless energy/fatigue   Person(s) Educated Patient   Methods Explanation;Demonstration;Tactile cues;Verbal cues;Handout   Comprehension Returned demonstration;Verbalized understanding             PT  Long Term Goals - 04/28/15 0949    PT LONG TERM GOAL #1   Title Pt will decrease her score on PGQ from 57% to < 25% to increase particiaption in ADLs.    Time 12   Period Weeks   Status On-going   PT LONG TERM GOAL #2   Title Pt will decrease her FSS score from 48% to < 25% in order to have increased energy conservation for home activity after working long hours and being on her feet al day.    Time 12   Period Weeks   Status On-going   PT LONG TERM GOAL #3   Title Pt will be compliant with HEP in order to achieve self-management of pain.    Time 12   Period Weeks   Status On-going               Plan - 04/29/15 1333    Clinical Impression Statement Pt is progressing well with significantly less antalgic movements w/ bed mobility. Pt was able to demo functional range of spinal movements without SIJ belt and no report  of pain; indicating increased pelvic girdle stability. Advanced her strengthening program today and initiated pelvic floor exercises. Continue to educate pt at next session on use of breathing techniques; relaxation post-work and labor. Anticipate pt will progress towards her goals.    Pt will benefit from skilled therapeutic intervention in order to improve on the following deficits Abnormal gait;Decreased mobility;Decreased strength;Postural dysfunction;Improper body mechanics;Pain;Decreased endurance;Decreased range of motion;Decreased coordination;Hypomobility;Impaired flexibility;Decreased balance;Decreased activity tolerance   Rehab Potential Good   PT Frequency 2x / week   PT Duration 12 weeks   PT Treatment/Interventions ADLs/Self Care Home Management;Functional mobility training;Stair training;Gait training;Biofeedback;Moist Heat;Therapeutic activities;Therapeutic exercise;Cryotherapy;Balance training;Neuromuscular re-education;Patient/family education;Passive range of motion;Manual techniques;Dry needling;Energy conservation   Consulted and Agree with Plan of Care Patient        Problem List Patient Active Problem List   Diagnosis Date Noted  . Migraine 12/13/2013     Jennifer Morse  ,PT, DPT, E-RYT 04/29/2015, 1:36 PM  [place] [facility] MAIN [facility] SERVICES [address] [place], [place], [address] Phone: [phone]   Fax:  [phone]

## 2015-04-30 ENCOUNTER — Encounter: Payer: 59 | Admitting: Physical Therapy

## 2015-05-07 ENCOUNTER — Ambulatory Visit: Payer: 59 | Attending: Obstetrics and Gynecology | Admitting: Physical Therapy

## 2015-05-07 DIAGNOSIS — R279 Unspecified lack of coordination: Secondary | ICD-10-CM | POA: Diagnosis present

## 2015-05-07 DIAGNOSIS — R531 Weakness: Secondary | ICD-10-CM | POA: Diagnosis present

## 2015-05-07 NOTE — Therapy (Signed)
[place] MAIN [facility] SERVICES [address] [place], [place], [address] Phone: [phone]   Fax:  [phone]  Physical Therapy Treatment  Patient Details  Name: Jennifer Morse MRN: [id number] Date of Birth: January 02, 1991 Referring Provider:  Evonnie Pat, CNM  Encounter Date: 05/07/2015      PT End of Session - 05/07/15 2332    Visit Number 3   Number of Visits 12   Date for PT Re-Evaluation 07/22/15   PT Start Time 0800   PT Stop Time 0855   PT Time Calculation (min) 55 min   Equipment Utilized During Treatment Other (comment)  SIJ belt   Activity Tolerance Patient tolerated treatment well;No increased pain   Behavior During Therapy [facility] for tasks assessed/performed      Past Medical History  Diagnosis Date  . Migraine with aura, intractable, with status migrainosus 1995  . Frequent headaches   . Irregular menses   . Irregular periods     Past Surgical History  Procedure Laterality Date  . Wisdom tooth extraction  2012    There were no vitals filed for this visit.  Visit Diagnosis:  Lack of coordination  Weakness      Subjective Assessment - 05/07/15 0812    Subjective Pt feels 90% improvement with her back and anterior pelvic pain. Pt states her anterior pelvic pain has subsided for past week.     Pertinent History Pt had hurt her back a 26-2 yo ago from a work injury from handling a pt and received chiropractic care without success. Her LBP resolved after 4 months when she swung a baseball bat. Pt exercised from Nov 2015- May 2016 (walking, jogging, stairclimbing)    Patient Stated Goals pain relief <5/10             [facility] PT Assessment - 05/07/15 0850    Assessment   Medical Diagnosis LBP   Precautions   Precautions None   Restrictions   Weight Bearing Restrictions No   Home Environment   Additional Comments 4-5 stairs  rail    Prior Function   Level of Independence Independent   Observation/Other  Assessments   Other Surveys  --  PGQ: 57% to 25%, FSS: 48% to 40%    ROM / Strength   AROM / PROM / Strength --  bil hip abd 5/5    Palpation   SI assessment  significantly less tenderness at pubic symphsis    Bed Mobility   Bed Mobility --  able to perform rolling bilaterally without pain nor SIJ bel                     [facility] Adult PT Treatment/Exercise - 05/07/15 0850    Exercises   Other Exercises  see pt instructions   stretches and strengthening to address rounded shoulders   Manual Therapy   Soft tissue mobilization bilateral pectoralis, effleurage stretching                 PT Education - 05/07/15 0849    Education provided Yes   Education Details HEP for upper back, chest tension releases and strengthening mid back, and quick pelvic floor exercises   Person(s) Educated Patient   Methods Explanation;Demonstration;Tactile cues;Verbal cues;Handout   Comprehension Verbalized understanding;Returned demonstration;Verbal cues required             PT Long Term Goals - 05/07/15 0815    PT LONG TERM GOAL #1   Title  Pt will decrease her score on PGQ from 57% to < 25% to increase particiaption in ADLs. (05/07/15:  26%)    Time 12   Period Weeks   Status Achieved   PT LONG TERM GOAL #2   Title Pt will decrease her FSS score from 48% to < 25% in order to have increased energy conservation for home activity after working long hours and being on her feet all day. (05/07/15: 26% )   Time 12   Period Weeks   Status Partially Met   PT LONG TERM GOAL #3   Title Pt will be compliant with HEP in order to achieve self-management of pain.    Time 12   Period Weeks   Status Achieved   PT LONG TERM GOAL #4   Title Pt will be able to demo proper bearing down technique with transverse abdominis activation in order to minimize risk of perineal injuries during labor.    Time 26   Period Weeks   Status New               Plan - 05/07/15 2332    Clinical  Impression Statement Pt is managing well her back and anterior pelvic pain with pelvic floor/ hip strengthening HEP, proper lifting techniques. Pt demo significantly improvement with improved hip/ deep core strength, posture, body mechanics for simulated work tasks. Pt showed ability to perform bed mobility without pain and SIJ belt. Addressed tight pectoralis and upper back/neck mm w/ manual Tx/ HEP which pt tolerated without complaints, showing decreased rounded shoulders and decreased mm tensions post-Tx. Focused on these deficits today in order to counterbalance repeated lifting at work and counter potential risks for upper body imbalances with post-partum breastfeeding/lifting baby mechanics. Pt would benefit from one more visit before d/c to learn aobut breathing techniques to use during labor to minimize improper bearing down of pelvic floor mm. Anticipate pt will achieve her goals.       Pt will benefit from skilled therapeutic intervention in order to improve on the following deficits Abnormal gait;Decreased mobility;Decreased strength;Postural dysfunction;Improper body mechanics;Pain;Decreased endurance;Decreased range of motion;Decreased coordination;Hypomobility;Impaired flexibility;Decreased balance;Decreased activity tolerance   Rehab Potential Good   PT Frequency 2x / week   PT Duration 12 weeks   PT Treatment/Interventions ADLs/Self Care Home Management;Functional mobility training;Stair training;Gait training;Biofeedback;Moist Heat;Therapeutic activities;Therapeutic exercise;Cryotherapy;Balance training;Neuromuscular re-education;Patient/family education;Passive range of motion;Manual techniques;Dry needling;Energy conservation   Consulted and Agree with Plan of Care Patient        Problem List Patient Active Problem List   Diagnosis Date Noted  . Migraine 12/13/2013    [name] ,PT, DPT, E-RYT  05/07/2015, 11:51 PM  [place] MAIN  [facility] SERVICES [address] [place], [place], [address] Phone: [phone]   Fax:  [phone]

## 2015-05-07 NOTE — Patient Instructions (Addendum)
Pect stretch by wall 5 breaths each       Elbow/ head press into the wall  5 sec hold, 10 reps         Chest rows and lat pull with chin pulled back w/ yellow band   10reps x 2 sets each                Foam roller, shoulder roller backwards and arm swings during work (unlock knees), and continue with open book to relieve mid back tensions    Pelvic floor exercises : Long holds 10 sec  Quick holds

## 2015-05-12 ENCOUNTER — Ambulatory Visit: Payer: 59 | Admitting: Physical Therapy

## 2015-05-12 DIAGNOSIS — R531 Weakness: Secondary | ICD-10-CM

## 2015-05-12 DIAGNOSIS — R279 Unspecified lack of coordination: Secondary | ICD-10-CM | POA: Diagnosis not present

## 2015-05-12 NOTE — Patient Instructions (Addendum)
  Cues for transverse mm activation for lowering pelvic floor mm during active labor Double kneeling over ball, seated pelvic rotations for relaxation of pelvic floor during labor Adductor stretch w/ side stretch seated over ball in a corner (to secure position of ball)   Semi tandem stance at changing table instead of forward bending  Tricep curls over curl 5# x 5 reps BUE Tricep curls behind head w/ squats 5 reps (knees not past toes)  (after pregnancy, no lifting > 10 # but can use 2# weights to gradually build back up after lifting restriction is cleared by MD) cues for deep core engagement in birddog over ball (UE only)

## 2015-05-12 NOTE — Therapy (Signed)
[place] MAIN [facility] SERVICES [address] [place], [place], [address] Phone: [phone]   Fax:  [phone]  Physical Therapy Treatment/ Discharge Summary   Patient Details  Name: Jennifer Morse MRN: [id number] Date of Birth: 1991-03-02 Referring Provider:  Evonnie Pat, CNM  Encounter Date: 05/12/2015      PT End of Session - 05/12/15 1201    Visit Number 5   Number of Visits 12   Date for PT Re-Evaluation 07/22/15   PT Start Time 0804   PT Stop Time 0845   PT Time Calculation (min) 41 min   Equipment Utilized During Treatment Other (comment)     Activity Tolerance Patient tolerated treatment well;No increased pain   Behavior During Therapy [facility] for tasks assessed/performed      Past Medical History  Diagnosis Date  . Migraine with aura, intractable, with status migrainosus 1995  . Frequent headaches   . Irregular menses   . Irregular periods     Past Surgical History  Procedure Laterality Date  . Wisdom tooth extraction  2012    There were no vitals filed for this visit.  Visit Diagnosis:  Lack of coordination  Weakness      Subjective Assessment - 05/12/15 0859    Subjective Pt rpeorted feeling a "Great Deal Better" on the Global Rate of Change Questionnaire since [facility]. Pt reported she catches herself at home with activities when doing the task with poor form and stops to recorrects herself. Pt also asks for help at work to deliver plates to the table. Pt sates she is sleeping better and wears her belt at work.    Pertinent History Pt had hurt her back a 85-2 yo ago from a work injury from handling a pt and received chiropractic care without success. Her LBP resolved after 4 months when she swung a baseball bat. Pt exercised from Nov 2015- May 2016 (walking, jogging, stairclimbing)    Patient Stated Goals pain relief <5/10    Currently in Pain? No/denies                         [facility] Adult PT  Treatment/Exercise - 05/12/15 0847    Therapeutic Activites    Other Therapeutic Activities semi tandem stance at changing table, exhaling w/ lifting   Neuro Re-ed    Neuro Re-ed Details  activate transverse abdominis to lower pelvic floor mm   deep core for birddog, alignment in squats   Exercises   Other Exercises  see pt instructions                PT Education - 05/12/15 0858    Education provided Yes   Education Details HEP, D/C, labor and post-partum education   Person(s) Educated Patient   Methods Explanation;Demonstration;Tactile cues;Verbal cues   Comprehension Verbalized understanding;Returned demonstration;Verbal cues required             PT Long Term Goals - 05/12/15 1202    PT LONG TERM GOAL #1   Title Pt will decrease her score on PGQ from 57% to < 25% to increase particiaption in ADLs. (05/07/15:  25%)    Time 12   Period Weeks   Status Achieved   PT LONG TERM GOAL #2   Title Pt will decrease her FSS score from 48% to < 25% in order to have increased energy conservation for home activity after working long hours and being on her feet all  day. (05/07/15: 40% )   Time 12   Period Weeks   Status Partially Met   PT LONG TERM GOAL #3   Title Pt will be compliant with HEP in order to achieve self-management of pain.    Time 12   Period Weeks   Status Achieved   PT LONG TERM GOAL #4   Title Pt will be able to demo proper bearing down technique with transverse abdominis activation in order to minimize risk of perineal injuries during labor.    Time 1   Period Weeks   Status Achieved               Plan - 05/12/15 0902    Clinical Impression Statement Pt has met 3/4 of her goals and partially met one goal .  She has returned to performing tasks at home and work with significantly minimized pain. Pt demo ability to walk, change bed positions, and lift without complaint of pain, antalgic movements, nor need to wear SIJ belt during session.  Pt's score  on Pelvic Girdle Questionnaire decreased from  57% to 25%. Pt's partially met goal is related to fatigue which showed some change on the Fatigue Severity Scale. Pt was educated on ways to minimize risk for deep core mm  injury with breathing techniques during labor along with body mechanics and low grade conditioning post-delivery. 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     Pt will benefit from skilled therapeutic intervention in order to improve on the following deficits Abnormal gait;Decreased mobility;Decreased strength;Postural dysfunction;Improper body mechanics;Pain;Decreased endurance;Decreased range of motion;Decreased coordination;Hypomobility;Impaired flexibility;Decreased balance;Decreased activity tolerance   Rehab Potential Good   PT Frequency 2x / week   PT Duration 12 weeks   PT Treatment/Interventions ADLs/Self Care Home Management;Functional mobility training;Stair training;Gait training;Biofeedback;Moist Heat;Therapeutic activities;Therapeutic exercise;Cryotherapy;Balance training;Neuromuscular re-education;Patient/family education;Passive range of motion;Manual techniques;Dry needling;Energy conservation   Consulted and Agree with Plan of Care Patient        Problem List Patient Active Problem List   Diagnosis Date Noted  . Migraine 12/13/2013    [name]  ,PT, DPT, E-RYT  05/12/2015, 12:05 PM  [place] MAIN [facility] SERVICES [address] [place], [place], [address] Phone: [phone]   Fax:  [phone]

## 2015-05-15 ENCOUNTER — Ambulatory Visit (INDEPENDENT_AMBULATORY_CARE_PROVIDER_SITE_OTHER): Payer: 59 | Admitting: Obstetrics and Gynecology

## 2015-05-15 ENCOUNTER — Encounter: Payer: Self-pay | Admitting: Obstetrics and Gynecology

## 2015-05-15 VITALS — BP 107/64 | HR 78 | Wt 203.7 lb

## 2015-05-15 DIAGNOSIS — Z3493 Encounter for supervision of normal pregnancy, unspecified, third trimester: Secondary | ICD-10-CM

## 2015-05-15 LAB — POCT URINALYSIS DIPSTICK
Bilirubin, UA: NEGATIVE
Glucose, UA: NEGATIVE
Ketones, UA: NEGATIVE
LEUKOCYTES UA: NEGATIVE
NITRITE UA: NEGATIVE
PH UA: 7
RBC UA: NEGATIVE
Spec Grav, UA: 1.015
Urobilinogen, UA: 0.2

## 2015-05-15 NOTE — Progress Notes (Signed)
ROB- doing well, occassional [place], cultures next visit

## 2015-05-15 NOTE — Progress Notes (Signed)
ROB-has had several episodes of lower abd pain

## 2015-05-18 ENCOUNTER — Telehealth: Payer: Self-pay | Admitting: Obstetrics and Gynecology

## 2015-05-18 NOTE — Telephone Encounter (Signed)
Called pt left detailed message to take some benadryl po, and to use some benadryl cream, also [place] [facility] clinic if need be

## 2015-05-18 NOTE — Telephone Encounter (Signed)
Has 40 plus bug bites, hands are swollen, itchy  and nothing has helped.

## 2015-05-25 ENCOUNTER — Encounter: Payer: Self-pay | Admitting: Obstetrics and Gynecology

## 2015-05-25 ENCOUNTER — Ambulatory Visit (INDEPENDENT_AMBULATORY_CARE_PROVIDER_SITE_OTHER): Payer: 59 | Admitting: Obstetrics and Gynecology

## 2015-05-25 VITALS — BP 105/69 | HR 103 | Wt 207.4 lb

## 2015-05-25 DIAGNOSIS — Z3685 Encounter for antenatal screening for Streptococcus B: Secondary | ICD-10-CM

## 2015-05-25 DIAGNOSIS — Z3493 Encounter for supervision of normal pregnancy, unspecified, third trimester: Secondary | ICD-10-CM

## 2015-05-25 DIAGNOSIS — Z113 Encounter for screening for infections with a predominantly sexual mode of transmission: Secondary | ICD-10-CM

## 2015-05-25 DIAGNOSIS — Z36 Encounter for antenatal screening of mother: Secondary | ICD-10-CM

## 2015-05-25 LAB — POCT URINALYSIS DIPSTICK
Bilirubin, UA: NEGATIVE
Blood, UA: NEGATIVE
GLUCOSE UA: NEGATIVE
KETONES UA: NEGATIVE
NITRITE UA: NEGATIVE
PH UA: 6.5
Spec Grav, UA: 1.015
Urobilinogen, UA: 0.2

## 2015-05-25 MED ORDER — INFLUENZA VAC SPLIT QUAD 0.5 ML IM SUSY
0.5000 mL | PREFILLED_SYRINGE | Freq: Once | INTRAMUSCULAR | Status: AC
  Administered 2015-05-25: 0.5 mL via INTRAMUSCULAR

## 2015-05-25 NOTE — Progress Notes (Signed)
ROB- doing well, spouse as labor support, recommended resuming sexual activity, labor precautions discussed

## 2015-05-25 NOTE — Progress Notes (Signed)
ROB-pt is having some pelvic pressure, some contractions

## 2015-05-29 ENCOUNTER — Encounter: Payer: 59 | Admitting: Obstetrics and Gynecology

## 2015-06-02 ENCOUNTER — Encounter: Payer: Self-pay | Admitting: Obstetrics and Gynecology

## 2015-06-02 ENCOUNTER — Other Ambulatory Visit: Payer: Self-pay | Admitting: Obstetrics and Gynecology

## 2015-06-02 ENCOUNTER — Ambulatory Visit (INDEPENDENT_AMBULATORY_CARE_PROVIDER_SITE_OTHER): Payer: 59 | Admitting: Obstetrics and Gynecology

## 2015-06-02 VITALS — BP 107/67 | HR 88 | Wt 206.5 lb

## 2015-06-02 DIAGNOSIS — Z3493 Encounter for supervision of normal pregnancy, unspecified, third trimester: Secondary | ICD-10-CM

## 2015-06-02 LAB — POCT URINALYSIS DIPSTICK
Bilirubin, UA: NEGATIVE
Blood, UA: NEGATIVE
Glucose, UA: NEGATIVE
KETONES UA: NEGATIVE
LEUKOCYTES UA: NEGATIVE
Nitrite, UA: NEGATIVE
PROTEIN UA: NEGATIVE
Spec Grav, UA: 1.01
Urobilinogen, UA: 0.2
pH, UA: 6.5

## 2015-06-02 NOTE — Progress Notes (Signed)
ROB-red, itchy rash under breast, some pelvic pressure

## 2015-06-02 NOTE — Progress Notes (Signed)
ROB-doing well, cultures obtained. Labor precautions discussed.

## 2015-06-06 ENCOUNTER — Inpatient Hospital Stay
Admission: EM | Admit: 2015-06-06 | Discharge: 2015-06-06 | Disposition: A | Discharge status: Home / Self Care | Payer: 59 | Attending: Obstetrics and Gynecology | Admitting: Obstetrics and Gynecology

## 2015-06-06 DIAGNOSIS — Z3493 Encounter for supervision of normal pregnancy, unspecified, third trimester: Secondary | ICD-10-CM | POA: Diagnosis not present

## 2015-06-06 DIAGNOSIS — Z3A37 37 weeks gestation of pregnancy: Secondary | ICD-10-CM | POA: Diagnosis not present

## 2015-06-06 LAB — GC/CHLAMYDIA PROBE AMP
CHLAMYDIA, DNA PROBE: NEGATIVE
Neisseria gonorrhoeae by PCR: NEGATIVE

## 2015-06-06 LAB — STREP GP B NAA: STREP GROUP B AG: NEGATIVE

## 2015-06-06 NOTE — Progress Notes (Signed)
Nitrazine to perineum was negative, neg to pantiliner worn in by patient.   Perineum is dry.

## 2015-06-06 NOTE — Progress Notes (Signed)
Pt was given d/c inst and verbalized understanding.   Pt was then d/c home in stable condition with FOB

## 2015-06-06 NOTE — OB Triage Note (Signed)
Pt to L&D with c/o ?ROM at 1800 today X3.   Describes fluid as clear in nature.  No VB, +FM

## 2015-06-06 NOTE — Progress Notes (Signed)
Report given to Dr. Marcelline Mates.   Will watch patient for another hour and discharge home

## 2015-06-09 ENCOUNTER — Ambulatory Visit (INDEPENDENT_AMBULATORY_CARE_PROVIDER_SITE_OTHER): Payer: 59 | Admitting: Obstetrics and Gynecology

## 2015-06-09 ENCOUNTER — Encounter: Payer: Self-pay | Admitting: Obstetrics and Gynecology

## 2015-06-09 VITALS — BP 115/76 | HR 88 | Wt 208.5 lb

## 2015-06-09 DIAGNOSIS — Z3493 Encounter for supervision of normal pregnancy, unspecified, third trimester: Secondary | ICD-10-CM

## 2015-06-09 LAB — POCT URINALYSIS DIPSTICK
Bilirubin, UA: NEGATIVE
Blood, UA: NEGATIVE
GLUCOSE UA: NEGATIVE
KETONES UA: NEGATIVE
Nitrite, UA: NEGATIVE
Spec Grav, UA: 1.015
UROBILINOGEN UA: 0.2
pH, UA: 6

## 2015-06-09 NOTE — Progress Notes (Signed)
ROB-seen in L&D for contractions and leaking fluid- reports still sees fluid and wearing panty liner- has to change a few times a day- Fern negative, NTZ negative, wetprep normal- reassured patient.

## 2015-06-09 NOTE — Progress Notes (Signed)
ROB-pt is having lots of pelvic pressure, hurts when she sits and stands

## 2015-06-09 NOTE — Patient Instructions (Signed)
Braxton Hicks Contractions °Contractions of the uterus can occur throughout pregnancy. Contractions are not always a sign that you are in labor.  °WHAT ARE BRAXTON HICKS CONTRACTIONS?  °Contractions that occur before labor are called Braxton Hicks contractions, or false labor. Toward the end of pregnancy (32-34 weeks), these contractions can develop more often and may become more forceful. This is not true labor because these contractions do not result in opening (dilatation) and thinning of the cervix. They are sometimes difficult to tell apart from true labor because these contractions can be forceful and people have different pain tolerances. You should not feel embarrassed if you go to the hospital with false labor. Sometimes, the only way to tell if you are in true labor is for your health care provider to look for changes in the cervix. °If there are no prenatal problems or other health problems associated with the pregnancy, it is completely safe to be sent home with false labor and await the onset of true labor. °HOW CAN YOU TELL THE DIFFERENCE BETWEEN TRUE AND FALSE LABOR? °False Labor °· The contractions of false labor are usually shorter and not as hard as those of true labor.   °· The contractions are usually irregular.   °· The contractions are often felt in the front of the lower abdomen and in the groin.   °· The contractions may go away when you walk around or change positions while lying down.   °· The contractions get weaker and are shorter lasting as time goes on.   °· The contractions do not usually become progressively stronger, regular, and closer together as with true labor.   °True Labor °· Contractions in true labor last 30-70 seconds, become very regular, usually become more intense, and increase in frequency.   °· The contractions do not go away with walking.   °· The discomfort is usually felt in the top of the uterus and spreads to the lower abdomen and low back.   °· True labor can be  determined by your health care provider with an exam. This will show that the cervix is dilating and getting thinner.   °WHAT TO REMEMBER °· Keep up with your usual exercises and follow other instructions given by your health care provider.   °· Take medicines as directed by your health care provider.   °· Keep your regular prenatal appointments.   °· Eat and drink lightly if you think you are going into labor.   °· If Braxton Hicks contractions are making you uncomfortable:   °¨ Change your position from lying down or resting to walking, or from walking to resting.   °¨ Sit and rest in a tub of warm water.   °¨ Drink 2-3 glasses of water. Dehydration may cause these contractions.   °¨ Do slow and deep breathing several times an hour.   °WHEN SHOULD I SEEK IMMEDIATE MEDICAL CARE? °Seek immediate medical care if: °· Your contractions become stronger, more regular, and closer together.   °· You have fluid leaking or gushing from your vagina.   °· You have a fever.   °· You pass blood-tinged mucus.   °· You have vaginal bleeding.   °· You have continuous abdominal pain.   °· You have low back pain that you never had before.   °· You feel your baby's head pushing down and causing pelvic pressure.   °· Your baby is not moving as much as it used to.   °Document Released: 08/22/2005 Document Revised: 08/27/2013 Document Reviewed: 06/03/2013 °Exit[employer]® Patient Information ©2015 [employer]. This information is not intended to replace advice given to you by your health care   provider. Make sure you discuss any questions you have with your health care provider. ° °

## 2015-06-16 ENCOUNTER — Encounter: Payer: Self-pay | Admitting: Obstetrics and Gynecology

## 2015-06-16 ENCOUNTER — Ambulatory Visit (INDEPENDENT_AMBULATORY_CARE_PROVIDER_SITE_OTHER): Payer: 59 | Admitting: Obstetrics and Gynecology

## 2015-06-16 VITALS — BP 109/75 | HR 90 | Wt 207.5 lb

## 2015-06-16 DIAGNOSIS — Z3493 Encounter for supervision of normal pregnancy, unspecified, third trimester: Secondary | ICD-10-CM

## 2015-06-16 LAB — POCT URINALYSIS DIPSTICK
Bilirubin, UA: NEGATIVE
Blood, UA: NEGATIVE
Glucose, UA: NEGATIVE
Ketones, UA: NEGATIVE
NITRITE UA: NEGATIVE
PH UA: 7
PROTEIN UA: NEGATIVE
Spec Grav, UA: 1.015
UROBILINOGEN UA: 0.2

## 2015-06-16 NOTE — Progress Notes (Signed)
ROB- continues to have irregular contractions and increased pressure. 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 Requesting to be taken out of work. Labor precautions and danger signs reviewed. Plan for epidural in labor. RTC 1 week.  Seen with Barbette Merino, CNM  Cassandra Elder, RN-C, SNM

## 2015-06-16 NOTE — Progress Notes (Signed)
ROB-pelvic pressure, some contractions

## 2015-06-22 ENCOUNTER — Encounter: Payer: Self-pay | Admitting: *Deleted

## 2015-06-22 ENCOUNTER — Observation Stay
Admission: EM | Admit: 2015-06-22 | Discharge: 2015-06-22 | Disposition: A | Discharge status: Home / Self Care | Payer: 59 | Attending: Obstetrics and Gynecology | Admitting: Obstetrics and Gynecology

## 2015-06-22 DIAGNOSIS — Z3493 Encounter for supervision of normal pregnancy, unspecified, third trimester: Principal | ICD-10-CM | POA: Insufficient documentation

## 2015-06-22 DIAGNOSIS — Z3A39 39 weeks gestation of pregnancy: Secondary | ICD-10-CM | POA: Diagnosis not present

## 2015-06-23 ENCOUNTER — Encounter: Payer: Self-pay | Admitting: Obstetrics and Gynecology

## 2015-06-23 ENCOUNTER — Ambulatory Visit (INDEPENDENT_AMBULATORY_CARE_PROVIDER_SITE_OTHER): Payer: 59 | Admitting: Obstetrics and Gynecology

## 2015-06-23 VITALS — BP 100/72 | HR 86 | Wt 208.8 lb

## 2015-06-23 DIAGNOSIS — Z3493 Encounter for supervision of normal pregnancy, unspecified, third trimester: Secondary | ICD-10-CM

## 2015-06-23 LAB — POCT URINALYSIS DIPSTICK
Bilirubin, UA: NEGATIVE
Blood, UA: NEGATIVE
GLUCOSE UA: NEGATIVE
KETONES UA: NEGATIVE
NITRITE UA: NEGATIVE
PH UA: 6
Spec Grav, UA: 1.015
Urobilinogen, UA: 0.2

## 2015-06-23 NOTE — Progress Notes (Signed)
ROB- labor precautions discussed 

## 2015-06-23 NOTE — Progress Notes (Signed)
ROB-having some contractions, lots of pressure

## 2015-06-28 NOTE — Progress Notes (Signed)
NST INTERPRETATION:  Indications:contractions   Mode: External (applied, assessing) Baseline Rate (A): 130 bpm Variability: Moderate Accelerations: 15 x 15 Decelerations: None     Contraction Frequency (min): 3.5-7   Impression: Reactive NST   Plan:  1. Labor Precautions 2. Maintain OB appt as scheduled.    Brayton Mars, MD

## 2015-06-30 ENCOUNTER — Inpatient Hospital Stay
Admission: EM | Admit: 2015-06-30 | Discharge: 2015-07-03 | DRG: 775 | Disposition: A | Discharge status: Home / Self Care | Payer: 59 | Attending: Obstetrics and Gynecology | Admitting: Obstetrics and Gynecology

## 2015-06-30 DIAGNOSIS — Z87891 Personal history of nicotine dependence: Secondary | ICD-10-CM

## 2015-06-30 DIAGNOSIS — O2243 Hemorrhoids in pregnancy, third trimester: Secondary | ICD-10-CM | POA: Diagnosis present

## 2015-06-30 DIAGNOSIS — O480 Post-term pregnancy: Principal | ICD-10-CM | POA: Diagnosis present

## 2015-06-30 DIAGNOSIS — Z3A40 40 weeks gestation of pregnancy: Secondary | ICD-10-CM

## 2015-06-30 DIAGNOSIS — Z3490 Encounter for supervision of normal pregnancy, unspecified, unspecified trimester: Secondary | ICD-10-CM

## 2015-06-30 LAB — CBC
HEMATOCRIT: 34.2 % — AB (ref 35.0–47.0)
Hemoglobin: 11.4 g/dL — ABNORMAL LOW (ref 12.0–16.0)
MCH: 28.7 pg (ref 26.0–34.0)
MCHC: 33.3 g/dL (ref 32.0–36.0)
MCV: 86.3 fL (ref 80.0–100.0)
PLATELETS: 156 10*3/uL (ref 150–440)
RBC: 3.96 MIL/uL (ref 3.80–5.20)
RDW: 13.4 % (ref 11.5–14.5)
WBC: 11.6 10*3/uL — ABNORMAL HIGH (ref 3.6–11.0)

## 2015-06-30 LAB — TYPE AND SCREEN
ABO/RH(D): O POS
ANTIBODY SCREEN: NEGATIVE

## 2015-06-30 MED ORDER — LIDOCAINE HCL (PF) 1 % IJ SOLN
30.0000 mL | INTRAMUSCULAR | Status: DC | PRN
  Filled 2015-06-30: qty 30

## 2015-06-30 MED ORDER — OXYTOCIN BOLUS FROM INFUSION
500.0000 mL | INTRAVENOUS | Status: DC
  Administered 2015-07-01: 500 mL via INTRAVENOUS

## 2015-06-30 MED ORDER — CITRIC ACID-SODIUM CITRATE 334-500 MG/5ML PO SOLN: 30.0000 mL | ORAL | Status: DC | PRN

## 2015-06-30 MED ORDER — ACETAMINOPHEN 325 MG PO TABS: 650.0000 mg | ORAL_TABLET | ORAL | Status: DC | PRN

## 2015-06-30 MED ORDER — ONDANSETRON HCL 4 MG/2ML IJ SOLN: 4.0000 mg | Freq: Four times a day (QID) | INTRAMUSCULAR | Status: DC | PRN

## 2015-06-30 MED ORDER — LACTATED RINGERS IV SOLN: 500.0000 mL | INTRAVENOUS | Status: DC | PRN

## 2015-06-30 MED ORDER — LACTATED RINGERS IV SOLN
INTRAVENOUS | Status: DC
  Administered 2015-06-30: 22:00:00 via INTRAVENOUS

## 2015-06-30 MED ORDER — FENTANYL CITRATE (PF) 100 MCG/2ML IJ SOLN
50.0000 ug | INTRAMUSCULAR | Status: DC | PRN
  Administered 2015-07-01 (×2): 100 ug via INTRAVENOUS
  Filled 2015-06-30 (×2): qty 2

## 2015-06-30 MED ORDER — OXYTOCIN 40 UNITS IN LACTATED RINGERS INFUSION - SIMPLE MED
62.5000 mL/h | INTRAVENOUS | Status: DC
  Filled 2015-06-30: qty 1000

## 2015-06-30 NOTE — Progress Notes (Signed)
ADALYNN CORNE is a 24 y.o. G1P0000 at [redacted]w[redacted]d by LMP admitted for active labor, rupture of membranes  Subjective:   Objective: BP 127/80 mmHg  Pulse 103  Temp(Src) 97.7 F (36.5 C) (Oral)  LMP 09/21/2014 (LMP Unknown)      FHT:  reassuring before ambulating UC:   irregular, every 2-3 minutes SVE:   Dilation: 3 Effacement (%): 90 Station: -2 Exam by:: Tiffany, RN   Labs: Lab Results  Component Value Date   WBC 11.6* 06/30/2015   HGB 11.4* 06/30/2015   HCT 34.2* 06/30/2015   MCV 86.3 06/30/2015   PLT 156 06/30/2015    Assessment / Plan: SROM- ambulating   Labor: Progressing normally Preeclampsia:  labs stable Fetal Wellbeing:  Category III Pain Control:  Labor support without medications I/D:  n/a Anticipated MOD:  NSVD  Yann Biehn Burr 06/30/2015, 10:53 PM

## 2015-06-30 NOTE — H&P (Signed)
Obstetric History and Physical  Jennifer Morse is a 24 y.o. G1P0000 with IUP at [redacted]w[redacted]d presenting with srom AND CONTRACTIONS. Patient states she has been having  regular, every 2-3 minutes contractions, none vaginal bleeding, intact, ruptured, clear fluid membranes, with active fetal movement.    Prenatal Course Source of Care: [facility]  Pregnancy complications or risks:NONE  Prenatal labs and studies: ABO, Rh: O/--/-- (07/01 0900) Antibody: Negative (07/01 0900) Rubella: <0.90 (07/01 0900) RPR: Non Reactive (07/01 0900)  HBsAg: Negative (07/01 0900)  HIV: Non Reactive (07/01 0900)  [id number] (09/27 0930) 1 hr Glucola  normal Genetic screening normal Anatomy US normal  Past Medical History  Diagnosis Date  . Migraine with aura, intractable, with status migrainosus 1995  . Frequent headaches   . Irregular menses   . Irregular periods     Past Surgical History  Procedure Laterality Date  . Wisdom tooth extraction  2012    OB History  Gravida Para Term Preterm AB SAB TAB Ectopic Multiple Living  1 0 0 0 0 0 0 0 0 0     # Outcome Date GA Lbr Len/2nd Weight Sex Delivery Anes PTL Lv  1 Current               Social History   Social History  . Marital Status: Married    Spouse Name: N/A  . Number of Children: N/A  . Years of Education: N/A   Social History Main Topics  . Smoking status: Former Smoker -- 0.25 packs/day    Types: Cigarettes    Quit date: 02/26/2015  . Smokeless tobacco: Never Used  . Alcohol Use: 0.6 oz/week    1 Glasses of wine per week  . Drug Use: No  . Sexual Activity:    Partners: Male    Birth Control/ Protection: None   Other Topics Concern  . Not on file   Social History Narrative    Family History  Problem Relation Age of Onset  . Diabetes Maternal Grandmother   . Heart disease Maternal Grandmother   . Diabetes Maternal Grandfather   . Heart disease Maternal Grandfather   . Diabetes Mother   . Diabetes Sister      Prescriptions prior to admission  Medication Sig Dispense Refill Last Dose  . butalbital-acetaminophen-caffeine (ESGIC PLUS) 50-500-40 MG per tablet Take 1 tablet by mouth every 4 (four) hours as needed for pain. 30 tablet 0 Taking  . docusate sodium (COLACE) 100 MG capsule Take 100 mg by mouth daily as needed for mild constipation.   Taking  . Prenatal Vit-Fe Fumarate-FA (PRENATAL MULTIVITAMIN) TABS tablet Take 1 tablet by mouth daily at 12 noon.   Taking  . SUMAtriptan (IMITREX) 100 MG tablet TAKE 1 TABLET BY MOUTH AS NEEDED MAY REPEAT IN 2 HOURS IF HEADACHE PERSISTS OR RECURS. (Patient not taking: Reported on 06/16/2015) 10 tablet 0 Not Taking  . zolpidem (AMBIEN) 5 MG tablet Take 1 tablet (5 mg total) by mouth at bedtime as needed for sleep. 30 tablet 1 Taking    No Known Allergies  Review of Systems: Negative except for what is mentioned in HPI.  Physical Exam: BP 127/80 mmHg  Pulse 103  Temp(Src) 97.7 F (36.5 C) (Oral)  LMP 09/21/2014 (LMP Unknown) GENERAL: Well-developed, well-nourished female in no acute distress.  LUNGS: Clear to auscultation bilaterally.  HEART: Regular rate and rhythm. ABDOMEN: Soft, nontender, nondistended, gravid. EXTREMITIES: Nontender, no edema, 2+ distal pulses. Cervical Exam: Dilation: 3 Effacement (%): 90 Cervical Position:  Anterior Station: -2 Presentation: Vertex Exam by:: Tiffany, RN  FHT:  Baseline rate 135 bpm   Variability moderate  Accelerations present   Decelerations none Contractions: Every 2-3 mins, moderate to palpation   Pertinent Labs/Studies:   No results found for this or any previous visit (from the past 24 hour(s)).  Assessment : Jennifer Morse is a 24 y.o. G1P0000 at [redacted]w[redacted]d being admitted for labor.  Plan: Labor: Expectant management.  Induction/Augmentation as needed, per protocol FWB: Reassuring fetal heart tracing.  GBS negative Delivery plan: Hopeful for vaginal delivery Epidural when desired  Dickie Cloe  Trudee Kuster, CNM [facility], [facility]

## 2015-07-01 ENCOUNTER — Encounter: Payer: Self-pay | Admitting: Anesthesiology

## 2015-07-01 ENCOUNTER — Encounter: Payer: 59 | Admitting: Obstetrics and Gynecology

## 2015-07-01 ENCOUNTER — Inpatient Hospital Stay: Payer: 59 | Admitting: Anesthesiology

## 2015-07-01 LAB — ABO/RH: ABO/RH(D): O POS

## 2015-07-01 MED ORDER — FENTANYL 2.5 MCG/ML W/ROPIVACAINE 0.2% IN NS 100 ML EPIDURAL INFUSION (ARMC-ANES): 10.0000 mL/h | EPIDURAL | Status: DC

## 2015-07-01 MED ORDER — KETOROLAC TROMETHAMINE 30 MG/ML IJ SOLN: 30.0000 mg | Freq: Four times a day (QID) | INTRAMUSCULAR | Status: DC | PRN

## 2015-07-01 MED ORDER — MISOPROSTOL 200 MCG PO TABS
ORAL_TABLET | ORAL | Status: AC
  Filled 2015-07-01: qty 4

## 2015-07-01 MED ORDER — NALBUPHINE HCL 10 MG/ML IJ SOLN
5.0000 mg | Freq: Once | INTRAMUSCULAR | Status: DC | PRN
  Filled 2015-07-01: qty 0.5

## 2015-07-01 MED ORDER — MEASLES, MUMPS & RUBELLA VAC ~~LOC~~ INJ
0.5000 mL | INJECTION | Freq: Once | SUBCUTANEOUS | Status: AC
  Administered 2015-07-03: 0.5 mL via SUBCUTANEOUS
  Filled 2015-07-01: qty 0.5

## 2015-07-01 MED ORDER — PRENATAL MULTIVITAMIN CH
1.0000 | ORAL_TABLET | Freq: Every day | ORAL | Status: DC
  Administered 2015-07-01 – 2015-07-02 (×2): 1 via ORAL
  Filled 2015-07-01 (×2): qty 1

## 2015-07-01 MED ORDER — DIBUCAINE 1 % RE OINT
1.0000 "application " | TOPICAL_OINTMENT | RECTAL | Status: DC | PRN
  Filled 2015-07-01 (×4): qty 28

## 2015-07-01 MED ORDER — OXYCODONE-ACETAMINOPHEN 5-325 MG PO TABS
2.0000 | ORAL_TABLET | ORAL | Status: DC | PRN
  Administered 2015-07-01 – 2015-07-03 (×4): 2 via ORAL
  Filled 2015-07-01 (×4): qty 2

## 2015-07-01 MED ORDER — LIDOCAINE-EPINEPHRINE (PF) 1.5 %-1:200000 IJ SOLN
INTRAMUSCULAR | Status: DC | PRN
  Administered 2015-07-01: 3 mL via EPIDURAL

## 2015-07-01 MED ORDER — WITCH HAZEL-GLYCERIN EX PADS
1.0000 | MEDICATED_PAD | CUTANEOUS | Status: DC | PRN
Start: 2015-07-01 — End: 2015-07-03
  Filled 2015-07-01 (×2): qty 100

## 2015-07-01 MED ORDER — IBUPROFEN 600 MG PO TABS
ORAL_TABLET | ORAL | Status: AC
Start: 2015-07-01 — End: 2015-07-01
  Administered 2015-07-01: 600 mg via ORAL
  Filled 2015-07-01: qty 1

## 2015-07-01 MED ORDER — NALBUPHINE HCL 10 MG/ML IJ SOLN
5.0000 mg | INTRAMUSCULAR | Status: DC | PRN
  Filled 2015-07-01: qty 0.5

## 2015-07-01 MED ORDER — SODIUM CHLORIDE 0.9 % IJ SOLN
INTRAMUSCULAR | Status: AC
  Filled 2015-07-01: qty 3

## 2015-07-01 MED ORDER — SODIUM CHLORIDE 0.9 % IJ SOLN: 3.0000 mL | INTRAMUSCULAR | Status: DC | PRN

## 2015-07-01 MED ORDER — MEPERIDINE HCL 25 MG/ML IJ SOLN: 6.2500 mg | INTRAMUSCULAR | Status: DC | PRN

## 2015-07-01 MED ORDER — DIPHENHYDRAMINE HCL 25 MG PO CAPS: 25.0000 mg | ORAL_CAPSULE | ORAL | Status: DC | PRN

## 2015-07-01 MED ORDER — BENZOCAINE-MENTHOL 20-0.5 % EX AERO
1.0000 "application " | INHALATION_SPRAY | CUTANEOUS | Status: DC | PRN
  Filled 2015-07-01: qty 56

## 2015-07-01 MED ORDER — OXYCODONE-ACETAMINOPHEN 5-325 MG PO TABS
1.0000 | ORAL_TABLET | ORAL | Status: DC | PRN
  Administered 2015-07-01 – 2015-07-03 (×5): 1 via ORAL
  Filled 2015-07-01 (×4): qty 1

## 2015-07-01 MED ORDER — FENTANYL 2.5 MCG/ML W/ROPIVACAINE 0.2% IN NS 100 ML EPIDURAL INFUSION (ARMC-ANES)
EPIDURAL | Status: AC
  Administered 2015-07-01: 10 mL/h via EPIDURAL
  Filled 2015-07-01: qty 100

## 2015-07-01 MED ORDER — VARICELLA VIRUS VACCINE LIVE 1350 PFU/0.5ML IJ SUSR
0.5000 mL | Freq: Once | INTRAMUSCULAR | Status: AC
  Administered 2015-07-03: 0.5 mL via SUBCUTANEOUS
  Filled 2015-07-01: qty 0.5

## 2015-07-01 MED ORDER — SODIUM CHLORIDE 0.9 % IJ SOLN
INTRAMUSCULAR | Status: AC
  Filled 2015-07-01: qty 50

## 2015-07-01 MED ORDER — DIPHENHYDRAMINE HCL 25 MG PO CAPS: 25.0000 mg | ORAL_CAPSULE | Freq: Four times a day (QID) | ORAL | Status: DC | PRN

## 2015-07-01 MED ORDER — NALOXONE HCL 2 MG/2ML IJ SOSY
1.0000 ug/kg/h | PREFILLED_SYRINGE | INTRAVENOUS | Status: DC | PRN
  Filled 2015-07-01: qty 2

## 2015-07-01 MED ORDER — ONDANSETRON HCL 4 MG/2ML IJ SOLN: 4.0000 mg | INTRAMUSCULAR | Status: DC | PRN

## 2015-07-01 MED ORDER — SODIUM CHLORIDE 0.9 % IJ SOLN
INTRAMUSCULAR | Status: AC
  Filled 2015-07-01: qty 10

## 2015-07-01 MED ORDER — OXYTOCIN 10 UNIT/ML IJ SOLN
INTRAMUSCULAR | Status: AC
  Filled 2015-07-01: qty 2

## 2015-07-01 MED ORDER — SODIUM CHLORIDE 0.9 % IV SOLN
INTRAVENOUS | Status: DC | PRN
  Administered 2015-07-01 (×2): 5 mL via EPIDURAL

## 2015-07-01 MED ORDER — SIMETHICONE 80 MG PO CHEW: 80.0000 mg | CHEWABLE_TABLET | ORAL | Status: DC | PRN

## 2015-07-01 MED ORDER — IBUPROFEN 600 MG PO TABS
600.0000 mg | ORAL_TABLET | Freq: Four times a day (QID) | ORAL | Status: DC
  Administered 2015-07-01 – 2015-07-03 (×7): 600 mg via ORAL
  Filled 2015-07-01 (×6): qty 1

## 2015-07-01 MED ORDER — ONDANSETRON HCL 4 MG/2ML IJ SOLN: 4.0000 mg | Freq: Three times a day (TID) | INTRAMUSCULAR | Status: DC | PRN

## 2015-07-01 MED ORDER — ACETAMINOPHEN 325 MG PO TABS
650.0000 mg | ORAL_TABLET | ORAL | Status: DC | PRN
Start: 2015-07-01 — End: 2015-07-03

## 2015-07-01 MED ORDER — ZOLPIDEM TARTRATE 5 MG PO TABS
5.0000 mg | ORAL_TABLET | Freq: Every evening | ORAL | Status: DC | PRN
Start: 2015-07-01 — End: 2015-07-03

## 2015-07-01 MED ORDER — DOCUSATE SODIUM 100 MG PO CAPS
100.0000 mg | ORAL_CAPSULE | Freq: Every day | ORAL | Status: DC | PRN
  Administered 2015-07-02 – 2015-07-03 (×2): 100 mg via ORAL
  Filled 2015-07-01 (×2): qty 1

## 2015-07-01 MED ORDER — OXYCODONE-ACETAMINOPHEN 5-325 MG PO TABS
ORAL_TABLET | ORAL | Status: AC
  Administered 2015-07-01: 1 via ORAL
  Filled 2015-07-01: qty 1

## 2015-07-01 MED ORDER — ONDANSETRON HCL 4 MG PO TABS: 4.0000 mg | ORAL_TABLET | ORAL | Status: DC | PRN

## 2015-07-01 MED ORDER — WITCH HAZEL-GLYCERIN EX PADS
MEDICATED_PAD | CUTANEOUS | Status: AC
  Filled 2015-07-01: qty 100

## 2015-07-01 MED ORDER — LANOLIN HYDROUS EX OINT: TOPICAL_OINTMENT | CUTANEOUS | Status: DC | PRN

## 2015-07-01 MED ORDER — NALOXONE HCL 0.4 MG/ML IJ SOLN: 0.4000 mg | INTRAMUSCULAR | Status: DC | PRN

## 2015-07-01 MED ORDER — BENZOCAINE-MENTHOL 20-0.5 % EX AERO
INHALATION_SPRAY | CUTANEOUS | Status: AC
  Filled 2015-07-01: qty 56

## 2015-07-01 MED ORDER — DIPHENHYDRAMINE HCL 50 MG/ML IJ SOLN: 12.5000 mg | INTRAMUSCULAR | Status: DC | PRN

## 2015-07-01 NOTE — Anesthesia Preprocedure Evaluation (Signed)
Anesthesia Evaluation  Patient identified by MRN, date of birth, ID band Patient awake    Reviewed: Allergy & Precautions, H&P , NPO status , Patient's Chart, lab work & pertinent test results, reviewed documented beta blocker date and time   History of Anesthesia Complications Negative for: history of anesthetic complications  Airway Mallampati: II  TM Distance: >3 FB Neck ROM: full    Dental no notable dental hx. (+) Teeth Intact   Pulmonary neg pulmonary ROS, former smoker,    Pulmonary exam normal breath sounds clear to auscultation       Cardiovascular Exercise Tolerance: Good negative cardio ROS Normal cardiovascular exam Rhythm:regular Rate:Normal     Neuro/Psych  Headaches, negative psych ROS   GI/Hepatic Neg liver ROS, GERD  Medicated,  Endo/Other  negative endocrine ROS  Renal/GU negative Renal ROS  negative genitourinary   Musculoskeletal   Abdominal   Peds  Hematology negative hematology ROS (+)   Anesthesia Other Findings Past Medical History:   Migraine with aura, intractable, with status m* 1995         Frequent headaches                                           Irregular menses                                             Irregular periods                                            Reproductive/Obstetrics (+) Pregnancy                             Anesthesia Physical Anesthesia Plan  ASA: II  Anesthesia Plan: Epidural   Post-op Pain Management:    Induction:   Airway Management Planned:   Additional Equipment:   Intra-op Plan:   Post-operative Plan:   Informed Consent: I have reviewed the patients History and Physical, chart, labs and discussed the procedure including the risks, benefits and alternatives for the proposed anesthesia with the patient or authorized representative who has indicated his/her understanding and acceptance.   Dental Advisory  Given  Plan Discussed with: Anesthesiologist, CRNA and Surgeon  Anesthesia Plan Comments:         Anesthesia Quick Evaluation

## 2015-07-01 NOTE — Progress Notes (Signed)
Jennifer Morse is a 24 y.o. G1P0000 at [redacted]w[redacted]d by LMP admitted for active labor  Subjective:   Objective: BP 125/77 mmHg  Pulse 98  Temp(Src) 98.4 F (36.9 C) (Oral)  Resp 16  SpO2 96%  LMP 09/21/2014 (LMP Unknown) I/O last 3 completed shifts: In: 1491.1 [I.V.:1491.1] Out: 73 [Urine:50] Total I/O In: 135 [I.V.:125; Other:10] Out: -   FHT:  FHR: 140 bpm, variability: moderate,  accelerations:  Present,  decelerations:  Present variable early decels with each contraction UC:   regular, every 2 minutes SVE:   Dilation: 10 Effacement (%): 100 Station: 0 Exam by:: TR  Labs: Lab Results  Component Value Date   WBC 11.6* 06/30/2015   HGB 11.4* 06/30/2015   HCT 34.2* 06/30/2015   MCV 86.3 06/30/2015   PLT 156 06/30/2015    Assessment / Plan: Spontaneous labor, progressing normally  Labor: Progressing normally Preeclampsia:  labs stable Fetal Wellbeing:  Category III Pain Control:  Epidural I/D:  n/a Anticipated MOD:  NSVD  Jennifer Morse 07/01/2015, 8:18 AM

## 2015-07-01 NOTE — Anesthesia Procedure Notes (Signed)
Epidural Patient location during procedure: OB Start time: 07/01/2015 2:00 AM End time: 07/01/2015 2:10 AM  Staffing Anesthesiologist: Martha Clan Performed by: anesthesiologist   Preanesthetic Checklist Completed: patient identified, site marked, surgical consent, pre-op evaluation, timeout performed, IV checked, risks and benefits discussed and monitors and equipment checked  Epidural Patient position: sitting Prep: ChloraPrep Patient monitoring: heart rate, cardiac monitor, continuous pulse ox and blood pressure Approach: midline Location: L3-L4 Injection technique: LOR saline  Needle:  Needle type: Tuohy  Needle gauge: 18 G Needle length: 9 cm Needle insertion depth: 5 cm Catheter type: closed end Catheter size: 20 Guage Catheter at skin depth: 9.5 cm Test dose: negative  Additional Notes Reason for block:procedure for pain

## 2015-07-02 LAB — CBC
HCT: 28.4 % — ABNORMAL LOW (ref 35.0–47.0)
HEMOGLOBIN: 9.7 g/dL — AB (ref 12.0–16.0)
MCH: 30 pg (ref 26.0–34.0)
MCHC: 34.2 g/dL (ref 32.0–36.0)
MCV: 87.6 fL (ref 80.0–100.0)
Platelets: 118 10*3/uL — ABNORMAL LOW (ref 150–440)
RBC: 3.24 MIL/uL — ABNORMAL LOW (ref 3.80–5.20)
RDW: 13.5 % (ref 11.5–14.5)
WBC: 10 10*3/uL (ref 3.6–11.0)

## 2015-07-02 LAB — RPR: RPR Ser Ql: NONREACTIVE

## 2015-07-02 MED ORDER — FE FUMARATE-B12-VIT C-FA-IFC PO CAPS
1.0000 | ORAL_CAPSULE | Freq: Three times a day (TID) | ORAL | Status: DC
  Administered 2015-07-02 (×3): 1 via ORAL
  Filled 2015-07-02 (×10): qty 1

## 2015-07-02 NOTE — Anesthesia Postprocedure Evaluation (Signed)
  Anesthesia Post-op Note  Patient: Jennifer Morse  Procedure(s) Performed: * No procedures listed *  Anesthesia type:Epidural  Patient location: 345  Post pain: Pain level controlled  Post assessment: Post-op Vital signs reviewed, Patient's Cardiovascular Status Stable, Respiratory Function Stable, Patent Airway and No signs of Nausea or vomiting  Post vital signs: Reviewed and stable  Last Vitals:  Filed Vitals:   07/02/15 0759  BP: 99/64  Pulse: 82  Temp: 36.5 C  Resp: 20    Level of consciousness: awake, alert  and patient cooperative  Complications: No apparent anesthesia complications

## 2015-07-02 NOTE — Progress Notes (Signed)
Post Partum Day 1 Subjective: up ad lib, voiding and perineal pain- relieved with ice and meds  Objective: Blood pressure 99/64, pulse 82, temperature 97.7 F (36.5 C), temperature source Oral, resp. rate 20, last menstrual period 09/21/2014, SpO2 100 %, unknown if currently breastfeeding.  Physical Exam:  General: alert, cooperative and appears stated age Lochia: appropriate Uterine Fundus: firm Incision: slight perineal pain DVT Evaluation: No evidence of DVT seen on physical exam. Negative Homan's sign. Calf/Ankle edema is present.   Recent Labs  06/30/15 2154 07/02/15 0651  HGB 11.4* 9.7*  HCT 34.2* 28.4*    Assessment/Plan: Plan for discharge tomorrow and Breastfeeding Infant feeding soley Breast Anemic- add ferrous bid    LOS: 2 days   Harlin Mazzoni Burr 07/02/2015, 8:26 AM

## 2015-07-02 NOTE — Lactation Note (Signed)
This note was copied from the chart of Jennifer Morse. Lactation Consultation Note  Patient Name: Jennifer Morse Today's Date: 07/02/2015 Reason for consult: Initial assessment   Maternal Data    Feeding Feeding Type: Breast Fed  LATCH Score/Interventions Latch: Grasps breast easily, tongue down, lips flanged, rhythmical sucking. (had been given shield over night see note) Intervention(s): Adjust position;Assist with latch  Audible Swallowing: None Intervention(s): Skin to skin  Type of Nipple: Everted at rest and after stimulation  Comfort (Breast/Nipple): Soft / non-tender     Hold (Positioning): Full assist, staff holds infant at breast Intervention(s): Breastfeeding basics reviewed;Support Pillows;Position options;Skin to skin  LATCH Score: 6  Lactation Tools Discussed/Used  Shield had been used and was discussed. The baby had no milk in the shield. We talked about how if not needed it is best to not use a shield but put the baby directly to the breast.  Teh baby was able to latch to the breast.   Consult Status   ongoing   Daryel November 07/02/2015, 3:39 PM

## 2015-07-03 MED ORDER — IBUPROFEN 800 MG PO TABS: 800.0000 mg | ORAL_TABLET | Freq: Three times a day (TID) | ORAL | Status: DC | PRN

## 2015-07-03 MED ORDER — NORETHINDRONE 0.35 MG PO TABS: 1.0000 | ORAL_TABLET | Freq: Every day | ORAL | Status: DC

## 2015-07-03 NOTE — Discharge Summary (Signed)
Obstetric Discharge Summary Reason for Admission: onset of labor Prenatal Procedures: ultrasound Intrapartum Procedures: spontaneous vaginal delivery Postpartum Procedures: Rubella Ig and varicella Complications-Operative and Postpartum: 2nd degree perineal laceration and right labial laceration HEMOGLOBIN  Date Value Ref Range Status  07/02/2015 9.7* 12.0 - 16.0 g/dL Final   HCT  Date Value Ref Range Status  07/02/2015 28.4* 35.0 - 47.0 % Final   HEMATOCRIT  Date Value Ref Range Status  04/09/2015 34.6 34.0 - 46.6 % Final    Physical Exam:  General: alert, cooperative, appears stated age and fatigued Lochia: appropriate Uterine Fundus: firm Incision: NA DVT Evaluation: No evidence of DVT seen on physical exam. Negative Homan's sign.  Discharge Diagnoses: Term Pregnancy-delivered and anemia, hemorrhoids  Discharge Information: Date: 07/03/2015 Activity: pelvic rest Diet: routine Medications: PNV, Ibuprofen, Colace, Iron and camila Condition: stable Instructions: refer to practice specific booklet Discharge to: home   Newborn Data: Live born female Andi Birth Weight: 8 lb 2.9 oz (3710 g) APGAR: 7, 9  Home with mother.  Jennifer Morse 07/03/2015, 7:52 AM

## 2015-07-03 NOTE — Progress Notes (Signed)
Patient understands all discharge instructions and the need to make follow up appointments. Patient discharge via wheelchair with auxillary. 

## 2015-07-07 ENCOUNTER — Encounter: Payer: Self-pay | Admitting: Family Medicine

## 2015-08-05 IMAGING — US US OB LIMITED
1 series · 14 of 28 positions shown · non-contrast
Comparison: none

CLINICAL DATA: Uncertain LMP, irregular menses, pregnant, uncertain
dates

EXAM:
LIMITED OBSTETRIC ULTRASOUND

[Series 1: us ob limited · 0.27mm/px · 14 of 46 slices shown]
[im 2/46]
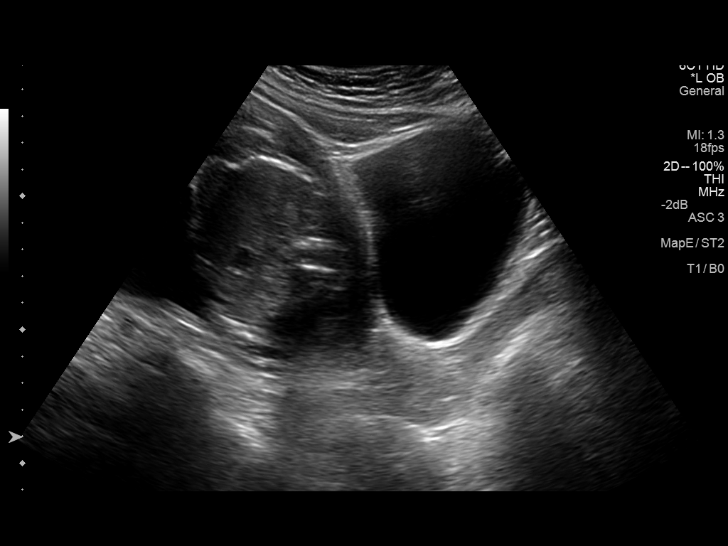
[im 6/46]
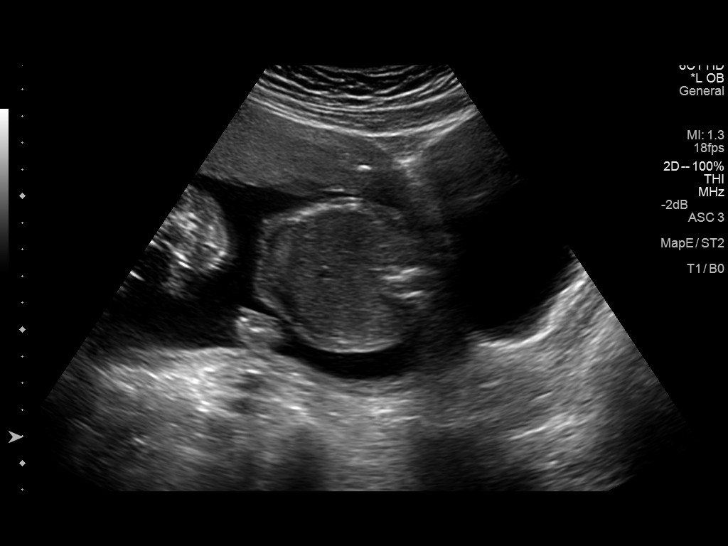
[im 9/46]
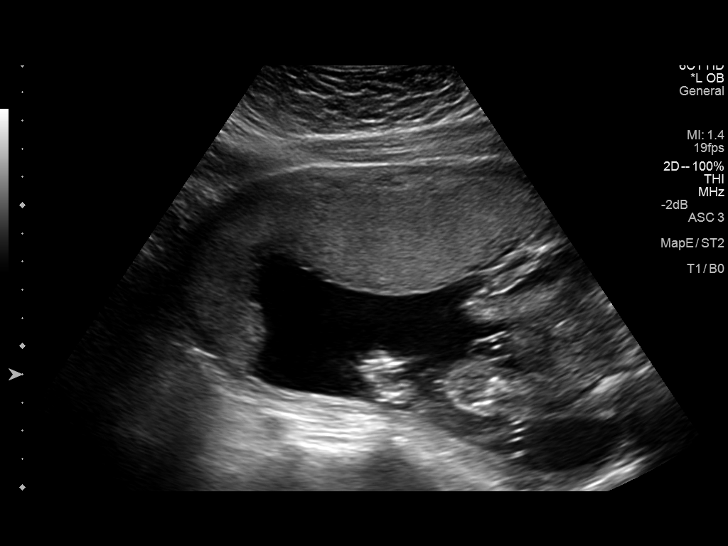
[im 12/46]
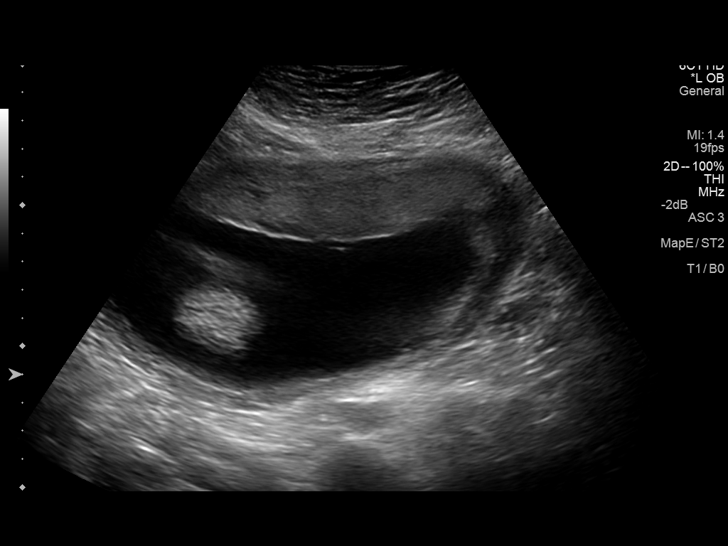
[im 16/46]
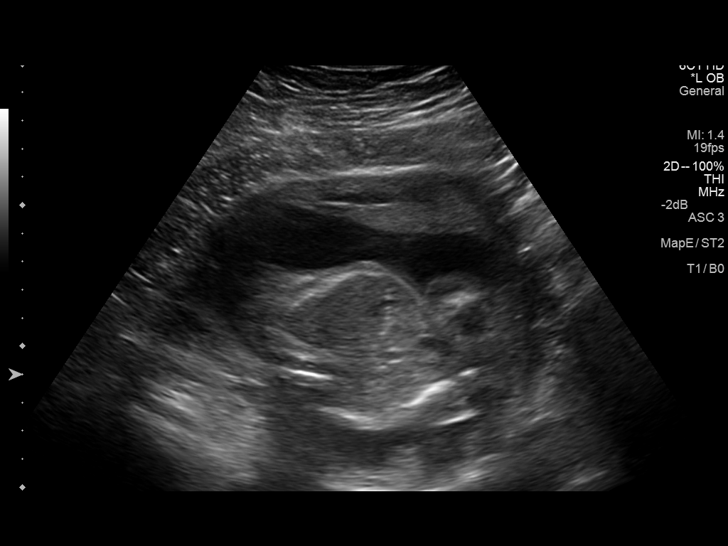
[im 19/46]
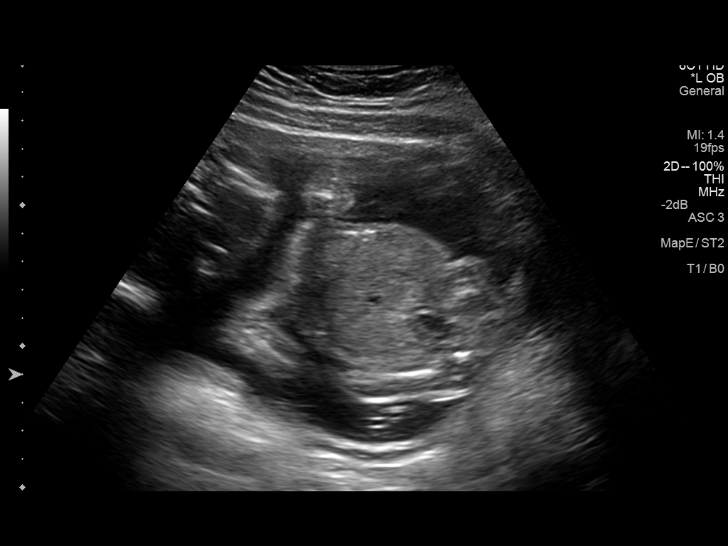
[im 22/46]
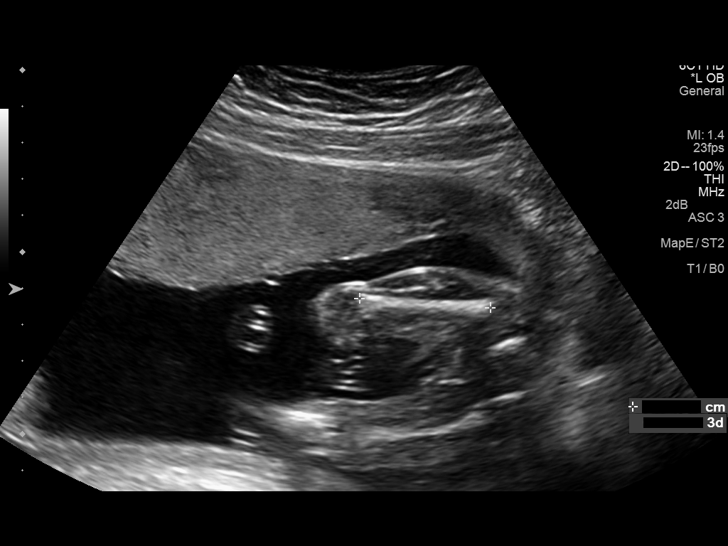
[im 26/46]
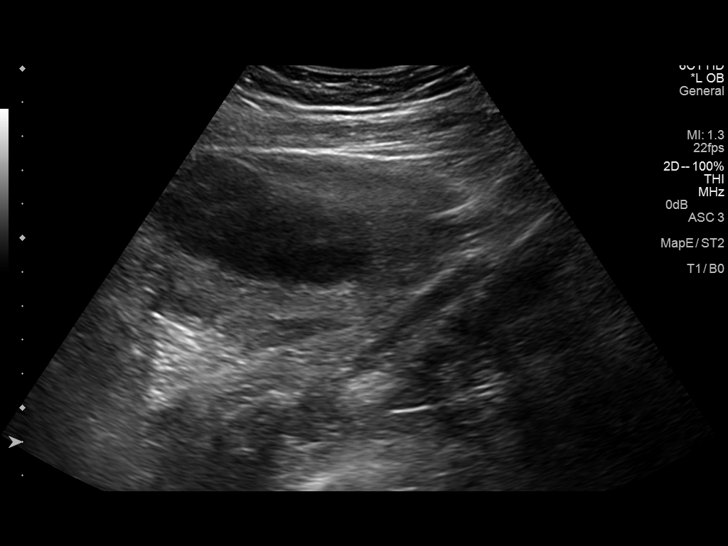
[im 29/46]
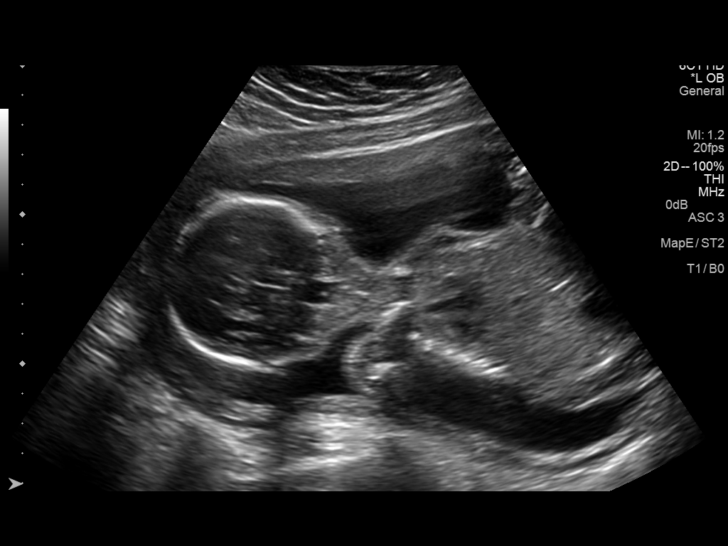
[im 32/46]
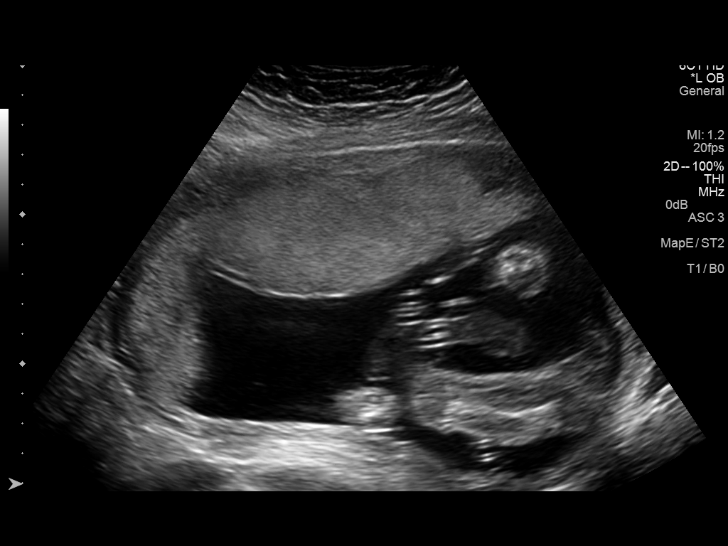
[im 36/46]
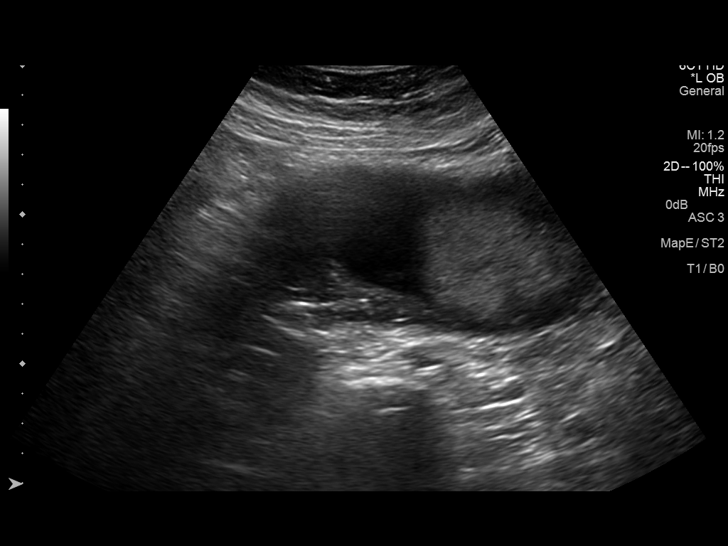
[im 39/46]
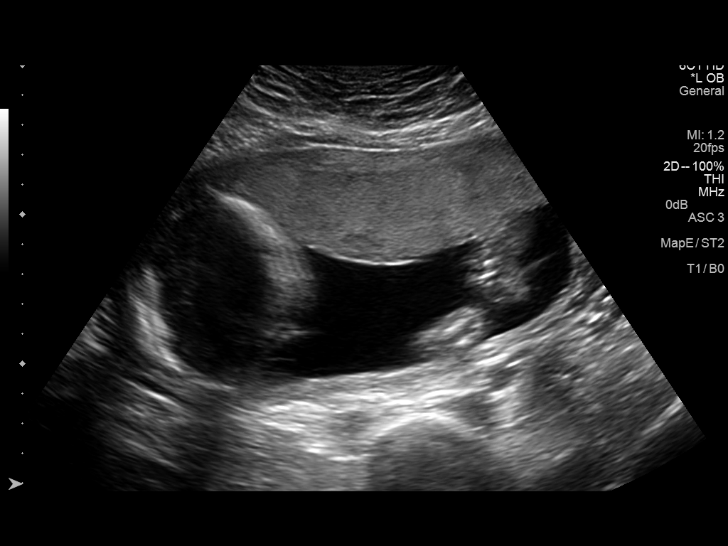
[im 42/46]
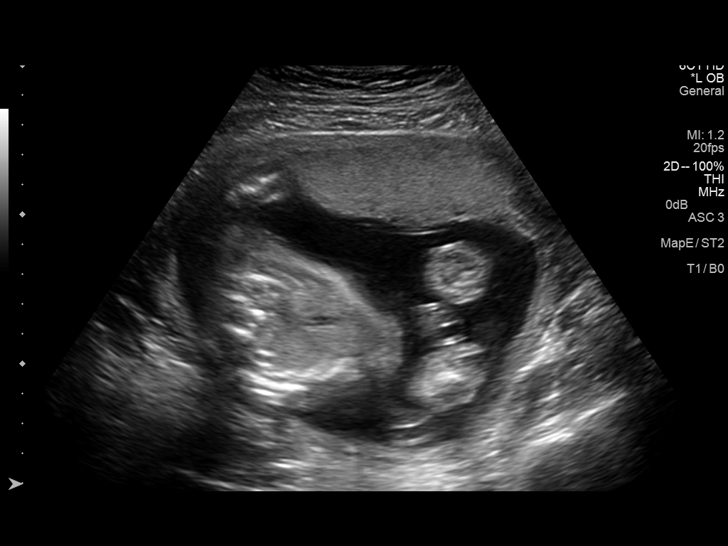
[im 46/46]
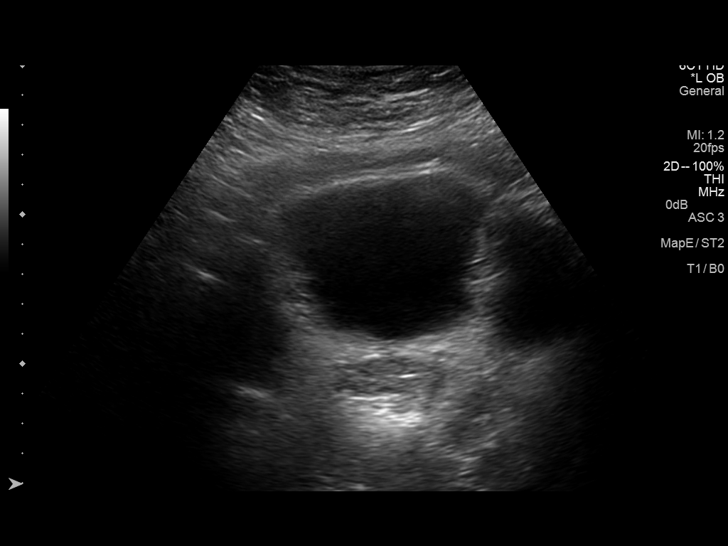

[14 of 28 positions shown; findings below may reference images not displayed]

FINDINGS: Number of Fetuses: 1

Heart Rate:  145 bpm

Movement: Yes

Presentation: Breech

Placental Location: Anterior

Previa: No

Amniotic Fluid (Subjective):  Subjectively normal

BPD:  5.4 cm 22 w 3 d

MATERNAL FINDINGS:

Cervix:  Closed

Uterus/Adnexae:  No acute abnormalities identified.
IMPRESSION: Single live intrauterine gestation as above.

No acute abnormalities.

This exam is performed on an emergent basis and does not
comprehensively evaluate fetal size, dating, or anatomy; follow-up
complete OB US should be considered if further fetal assessment is
warranted.

## 2015-08-12 DIAGNOSIS — F32A Depression, unspecified: Secondary | ICD-10-CM | POA: Insufficient documentation

## 2015-08-14 ENCOUNTER — Ambulatory Visit (INDEPENDENT_AMBULATORY_CARE_PROVIDER_SITE_OTHER): Payer: 59 | Admitting: Obstetrics and Gynecology

## 2015-08-14 ENCOUNTER — Encounter: Payer: Self-pay | Admitting: Obstetrics and Gynecology

## 2015-08-14 MED ORDER — HYDROCORTISONE ACE-PRAMOXINE 2.5-1 % RE CREA: 1.0000 "application " | TOPICAL_CREAM | Freq: Three times a day (TID) | RECTAL | Status: DC

## 2015-08-14 NOTE — Progress Notes (Signed)
  Subjective:     Jennifer Morse is a 24 y.o. female who presents for a postpartum visit. She is 6 weeks postpartum following a spontaneous vaginal delivery. I have fully reviewed the prenatal and intrapartum course. The delivery was at 40 gestational weeks. Outcome: spontaneous vaginal delivery. Anesthesia: epidural. Postpartum course has been complicated by perineal pain, hemorrhoids and colicky infant. Baby's course has been complicated by colick. Baby is feeding by breast. Bleeding no bleeding. Bowel function is normal. Bladder function is normal. Patient is not sexually active. Contraception method is oral progesterone-only contraceptive. Postpartum depression screening: positive.  The following portions of the patient's history were reviewed and updated as appropriate: allergies, current medications, past family history, past medical history, past social history, past surgical history and problem list.  Review of Systems Pertinent items noted in HPI and remainder of comprehensive ROS otherwise negative.   Objective:    BP 124/70 mmHg  Pulse 69  Ht 5\' 8"  (1.727 m)  Wt 192 lb 3.2 oz (87.181 kg)  BMI 29.23 kg/m2  Breastfeeding? Yes  General:  alert, cooperative and appears stated age   Breasts:  inspection negative, no nipple discharge or bleeding, no masses or nodularity palpable  Lungs: clear to auscultation bilaterally  Heart:  regular rate and rhythm, S1, S2 normal, no murmur, click, rub or gallop  Abdomen: soft, non-tender; bowel sounds normal; no masses,  no organomegaly   Vulva:  positive for pain at stitches site on palpation, well approxiamated and no retained stitches noted.  Vagina: normal vagina, no discharge, exudate, lesion, or erythema  Cervix:  multiparous appearance  Corpus: normal size, contour, position, consistency, mobility, non-tender  Adnexa:  no mass, fullness, tenderness  Rectal Exam: internal hemorrhoids        Assessment:     6 week postpartum exam.  Pap smear not done at today's visit.   Plan:    1. Contraception: oral progesterone-only contraceptive 2. Mild PP depression Perineal pain and internal hemorrhoids- rx sent in for Analapram HCL to use daily x 2 weeks and then prn, also gave sample of estrace to apply to perineum daily x 2 weeks then prn.  3. Follow up in: 4 months for AE or as needed.

## 2015-08-14 NOTE — Patient Instructions (Signed)
  Place postpartum visit patient instructions here.  

## 2015-08-15 LAB — CBC WITH DIFFERENTIAL/PLATELET
Basophils Absolute: 0 10*3/uL (ref 0.0–0.2)
Basos: 1 %
EOS (ABSOLUTE): 0.1 10*3/uL (ref 0.0–0.4)
Eos: 2 %
Hematocrit: 39.3 % (ref 34.0–46.6)
Hemoglobin: 13 g/dL (ref 11.1–15.9)
IMMATURE GRANULOCYTES: 0 %
Immature Grans (Abs): 0 10*3/uL (ref 0.0–0.1)
LYMPHS ABS: 2.1 10*3/uL (ref 0.7–3.1)
Lymphs: 40 %
MCH: 28.3 pg (ref 26.6–33.0)
MCHC: 33.1 g/dL (ref 31.5–35.7)
MCV: 86 fL (ref 79–97)
MONOS ABS: 0.4 10*3/uL (ref 0.1–0.9)
Monocytes: 8 %
Neutrophils Absolute: 2.6 10*3/uL (ref 1.4–7.0)
Neutrophils: 49 %
PLATELETS: 172 10*3/uL (ref 150–379)
RBC: 4.59 x10E6/uL (ref 3.77–5.28)
RDW: 13.2 % (ref 12.3–15.4)
WBC: 5.3 10*3/uL (ref 3.4–10.8)

## 2015-08-15 LAB — IRON: IRON: 65 ug/dL (ref 27–159)

## 2015-08-15 LAB — VITAMIN D 25 HYDROXY (VIT D DEFICIENCY, FRACTURES): VIT D 25 HYDROXY: 43.1 ng/mL (ref 30.0–100.0)

## 2015-08-15 LAB — HIV ANTIBODY (ROUTINE TESTING W REFLEX): HIV Screen 4th Generation wRfx: NONREACTIVE

## 2015-08-25 ENCOUNTER — Telehealth: Payer: Self-pay | Admitting: Obstetrics and Gynecology

## 2015-08-25 NOTE — Telephone Encounter (Signed)
Pt is breastfeeding and getting a cold. What meds can she take that would be safe for the baby

## 2015-08-26 ENCOUNTER — Other Ambulatory Visit: Payer: 59

## 2015-08-26 ENCOUNTER — Other Ambulatory Visit: Payer: Self-pay

## 2015-08-26 ENCOUNTER — Ambulatory Visit (INDEPENDENT_AMBULATORY_CARE_PROVIDER_SITE_OTHER): Payer: 59 | Admitting: Obstetrics and Gynecology

## 2015-08-26 ENCOUNTER — Encounter: Payer: Self-pay | Admitting: Obstetrics and Gynecology

## 2015-08-26 VITALS — BP 101/66 | HR 74 | Wt 193.7 lb

## 2015-08-26 DIAGNOSIS — R6889 Other general symptoms and signs: Secondary | ICD-10-CM | POA: Diagnosis not present

## 2015-08-26 LAB — PLEASE NOTE:

## 2015-08-26 LAB — INFLUENZA A AND B
Influenza A Ag, EIA: NEGATIVE
Influenza B Ag, EIA: NEGATIVE

## 2015-08-26 MED ORDER — OSELTAMIVIR PHOSPHATE 75 MG PO CAPS: 75.0000 mg | ORAL_CAPSULE | Freq: Two times a day (BID) | ORAL | Status: DC

## 2015-08-26 NOTE — Telephone Encounter (Signed)
She is having some nasal congestion, sore throat, dry hacky cough pls advise

## 2015-08-26 NOTE — Progress Notes (Signed)
Subjective:     Patient ID: Jennifer Morse, female   DOB: Apr 01, 1991, 24 y.o.   MRN: [id number]  HPI Sudden onset body aches and fever, with sore throat and dry cough yesterday, reports fever at 101.9 last night. Took tylenol cold & flu and symptoms improved for a little while, but came back.  Is breast feeding infant. Had Flu vaccine in Oct.  Review of Systems See above    Objective:   Physical Exam A&O x4  well groomed sickly female Lungs clear bilaterally Throat slightly red, negative lymphadenopathy. Nasal passages clear. Temp 98.3    Assessment:     Flu-like symptoms     Plan:     tami-flu started today Flu labs obtained Note for work given to excuse x 2 days while awaiting results. RTC prn  Sally Reimers Lazear, CNM

## 2015-08-27 ENCOUNTER — Encounter: Payer: Self-pay | Admitting: Obstetrics and Gynecology

## 2015-08-27 NOTE — Telephone Encounter (Signed)
Notified pt of results 

## 2015-08-27 NOTE — Telephone Encounter (Signed)
-----   Message from Joylene Igo, [place] sent at 08/27/2015 11:46 AM EST ----- Please let her know flu test is negative, she ca stop the tamiFlu and treat with other meds for now

## 2015-09-04 ENCOUNTER — Encounter: Payer: Self-pay | Admitting: Obstetrics and Gynecology

## 2015-09-04 ENCOUNTER — Ambulatory Visit (INDEPENDENT_AMBULATORY_CARE_PROVIDER_SITE_OTHER): Payer: 59 | Admitting: Obstetrics and Gynecology

## 2015-09-04 VITALS — BP 106/77 | HR 69 | Wt 192.4 lb

## 2015-09-04 DIAGNOSIS — F53 Postpartum depression: Secondary | ICD-10-CM

## 2015-09-04 DIAGNOSIS — O99345 Other mental disorders complicating the puerperium: Principal | ICD-10-CM

## 2015-09-04 MED ORDER — ESCITALOPRAM OXALATE 20 MG PO TABS: 20.0000 mg | ORAL_TABLET | Freq: Every day | ORAL | Status: DC

## 2015-09-04 MED ORDER — ESCITALOPRAM OXALATE 10 MG PO TABS: 10.0000 mg | ORAL_TABLET | Freq: Every day | ORAL | Status: DC

## 2015-09-04 NOTE — Patient Instructions (Signed)
Postpartum Depression and Baby Blues °The postpartum period begins right after the birth of a baby. During this time, there is often a great amount of joy and excitement. It is also a time of many changes in the life of the parents. Regardless of how many times a mother gives birth, each child brings new challenges and dynamics to the family. It is not unusual to have feelings of excitement along with confusing shifts in moods, emotions, and thoughts. All mothers are at risk of developing postpartum depression or the "baby blues." These mood changes can occur right after giving birth, or they may occur many months after giving birth. The baby blues or postpartum depression can be mild or severe. Additionally, postpartum depression can go away rather quickly, or it can be a long-term condition.  °CAUSES °Raised hormone levels and the rapid drop in those levels are thought to be a main cause of postpartum depression and the baby blues. A number of hormones change during and after pregnancy. Estrogen and progesterone usually decrease right after the delivery of your baby. The levels of thyroid hormone and various cortisol steroids also rapidly drop. Other factors that play a role in these mood changes include major life events and genetics.  °RISK FACTORS °If you have any of the following risks for the baby blues or postpartum depression, know what symptoms to watch out for during the postpartum period. Risk factors that may increase the likelihood of getting the baby blues or postpartum depression include: °· Having a personal or family history of depression.   °· Having depression while being pregnant.   °· Having premenstrual mood issues or mood issues related to oral contraceptives. °· Having a lot of life stress.   °· Having marital conflict.   °· Lacking a social support network.   °· Having a baby with special needs.   °· Having health problems, such as diabetes.   °SIGNS AND SYMPTOMS °Symptoms of baby blues  include: °· Brief changes in mood, such as going from extreme happiness to sadness. °· Decreased concentration.   °· Difficulty sleeping.   °· Crying spells, tearfulness.   °· Irritability.   °· Anxiety.   °Symptoms of postpartum depression typically begin within the first month after giving birth. These symptoms include: °· Difficulty sleeping or excessive sleepiness.   °· Marked weight loss.   °· Agitation.   °· Feelings of worthlessness.   °· Lack of interest in activity or food.   °Postpartum psychosis is a very serious condition and can be dangerous. Fortunately, it is rare. Displaying any of the following symptoms is cause for immediate medical attention. Symptoms of postpartum psychosis include:  °· Hallucinations and delusions.   °· Bizarre or disorganized behavior.   °· Confusion or disorientation.   °DIAGNOSIS  °A diagnosis is made by an evaluation of your symptoms. There are no medical or lab tests that lead to a diagnosis, but there are various questionnaires that a health care provider may use to identify those with the baby blues, postpartum depression, or psychosis. Often, a screening tool called the Edinburgh Postnatal Depression Scale is used to diagnose depression in the postpartum period.  °TREATMENT °The baby blues usually goes away on its own in 1-2 weeks. Social support is often all that is needed. You will be encouraged to get adequate sleep and rest. Occasionally, you may be given medicines to help you sleep.  °Postpartum depression requires treatment because it can last several months or longer if it is not treated. Treatment may include individual or group therapy, medicine, or both to address any social, physiological, and psychological   factors that may play a role in the depression. Regular exercise, a healthy diet, rest, and social support may also be strongly recommended.  °Postpartum psychosis is more serious and needs treatment right away. Hospitalization is often needed. °HOME CARE  INSTRUCTIONS °· Get as much rest as you can. Nap when the baby sleeps.   °· Exercise regularly. Some women find yoga and walking to be beneficial.   °· Eat a balanced and nourishing diet.   °· Do little things that you enjoy. Have a cup of tea, take a bubble bath, read your favorite magazine, or listen to your favorite music. °· Avoid alcohol.   °· Ask for help with household chores, cooking, grocery shopping, or running errands as needed. Do not try to do everything.   °· Talk to people close to you about how you are feeling. Get support from your partner, family members, friends, or other new moms. °· Try to stay positive in how you think. Think about the things you are grateful for.   °· Do not spend a lot of time alone.   °· Only take over-the-counter or prescription medicine as directed by your health care provider. °· Keep all your postpartum appointments.   °· Let your health care provider know if you have any concerns.   °SEEK MEDICAL CARE IF: °You are having a reaction to or problems with your medicine. °SEEK IMMEDIATE MEDICAL CARE IF: °· You have suicidal feelings.   °· You think you may harm the baby or someone else. °MAKE SURE YOU: °· Understand these instructions. °· Will watch your condition. °· Will get help right away if you are not doing well or get worse. °  °This information is not intended to replace advice given to you by your health care provider. Make sure you discuss any questions you have with your health care provider. °  °Document Released: 05/26/2004 Document Revised: 08/27/2013 Document Reviewed: 06/03/2013 °Elsevier Interactive Patient Education ©2016 [employer] ° °

## 2015-09-04 NOTE — Progress Notes (Signed)
Subjective:     Patient ID: Jennifer Morse, female   DOB: 03-17-1991, 24 y.o.   MRN: [id number]  HPI Reports worsening depression over last few weeks.  Review of Systems See depression scale in chart, but tearful all the time, wants to sleep, easily aggitated and anxious, feels depressed every day for last three weeks. Is now 3 months PP vaginal delivery of first infant.    Objective:   Physical Exam A&O x4  well groomed female, tearful, spouse in room and supportive. Holding and affectionate with infant . Thoughts and associations intact, mood depressed PPDQ score 16 (previously 9)    Assessment:     Postpartum depression     Plan:     Started lexapro @ 10mg  x 14d, then increase to 20mg  qd. Rx sent in Counseled on medication and expected outcome, side-effects, and risks.  RTC in 5-6 weeks for recheck or prn.  Melody [place], CNM

## 2015-10-09 ENCOUNTER — Encounter: Payer: Self-pay | Admitting: Obstetrics and Gynecology

## 2015-10-09 ENCOUNTER — Ambulatory Visit (INDEPENDENT_AMBULATORY_CARE_PROVIDER_SITE_OTHER): Payer: BLUE CROSS/BLUE SHIELD | Admitting: Obstetrics and Gynecology

## 2015-10-09 VITALS — BP 126/74 | HR 71 | Ht 68.0 in | Wt 197.6 lb

## 2015-10-09 DIAGNOSIS — Z79899 Other long term (current) drug therapy: Secondary | ICD-10-CM | POA: Diagnosis not present

## 2015-10-09 MED ORDER — ESCITALOPRAM OXALATE 10 MG PO TABS: 10.0000 mg | ORAL_TABLET | Freq: Every day | ORAL | Status: DC

## 2015-10-09 MED ORDER — ESCITALOPRAM OXALATE 20 MG PO TABS: 20.0000 mg | ORAL_TABLET | Freq: Every day | ORAL | Status: DC

## 2015-10-09 NOTE — Progress Notes (Signed)
Subjective:     Patient ID: Jennifer Morse, female   DOB: 30-May-1991, 25 y.o.   MRN: [id number]  HPI Seen previous for PPD and anxiety, started on lexapro 10mg  qd x 2 weeks then increased to 20mg  qd x 3 weeks, here for recheck.  Review of Systems States depression is much better and is not crying all the time, still feels some anxiety daily. And still feels fatigued, but infant isn't sleeping well.    Objective:   Physical Exam A&O x4 Well groomed female; smiling and pleasant mood today     Assessment:     PPD and anxiety responding well to Lexapro     Plan:     Will increase to 30mg  daily, new RX sent in RTC in 4 weeks for AE and will re-evaluate then.  Loneta Tamplin [place], CNM

## 2015-11-13 ENCOUNTER — Encounter: Payer: 59 | Admitting: Obstetrics and Gynecology

## 2016-08-11 ENCOUNTER — Ambulatory Visit (INDEPENDENT_AMBULATORY_CARE_PROVIDER_SITE_OTHER): Payer: 59 | Admitting: Obstetrics and Gynecology

## 2016-08-11 ENCOUNTER — Encounter: Payer: Self-pay | Admitting: Obstetrics and Gynecology

## 2016-08-11 VITALS — BP 123/84 | HR 70 | Wt 224.2 lb

## 2016-08-11 DIAGNOSIS — K6289 Other specified diseases of anus and rectum: Secondary | ICD-10-CM

## 2016-08-11 DIAGNOSIS — K5900 Constipation, unspecified: Secondary | ICD-10-CM | POA: Diagnosis not present

## 2016-08-11 DIAGNOSIS — E669 Obesity, unspecified: Secondary | ICD-10-CM

## 2016-08-11 MED ORDER — CYANOCOBALAMIN 1000 MCG/ML IJ SOLN: 1000.0000 ug | INTRAMUSCULAR | 1 refills | Status: DC

## 2016-08-11 MED ORDER — POLYETHYLENE GLYCOL 3350 17 GM/SCOOP PO POWD: 1.0000 | Freq: Once | ORAL | 1 refills | Status: AC

## 2016-08-11 MED ORDER — HYDROCORTISONE ACE-PRAMOXINE 1-1 % RE FOAM: 1.0000 | Freq: Two times a day (BID) | RECTAL | 1 refills | Status: DC

## 2016-08-11 MED ORDER — PHENTERMINE HCL 37.5 MG PO TABS: 37.5000 mg | ORAL_TABLET | Freq: Every day | ORAL | 2 refills | Status: DC

## 2016-08-11 NOTE — Progress Notes (Signed)
Subjective:     Patient ID: Jennifer Morse, female   DOB: 1991/07/29, 25 y.o.   MRN: [id number]  HPI Reports pain with BM since delivery with bleeding after BMs. Has chronic constipation and is taking 30mg  colace daily with no resolution. Also desires starting weight loss medication. Has worked well for her in the past.   Review of Systems Negative except stated in HPI    Objective:   Physical Exam A&O x4 Well groomed female Blood pressure 123/84, pulse 70, weight 224 lb 3.2 oz (101.7 kg), last menstrual period 07/26/2016, currently breastfeeding. Rectal exam: internal tear noted 5 o'clock, internal hemorrhoids noted.    Assessment:     Rectal pain Internal hemorrhoid Obesity Chronic constipation     Plan:     Miralax nightly prn Proctofoam HCL as needed Starting adipex daily with first B12 shot given today. RTC in 4 weeks for wt/bp/B12 injection.  Kyriana Yankee [place], CNM

## 2016-08-15 ENCOUNTER — Encounter: Payer: Self-pay | Admitting: Physician Assistant

## 2016-08-15 ENCOUNTER — Ambulatory Visit: Payer: Self-pay | Admitting: Physician Assistant

## 2016-08-15 VITALS — BP 110/72 | HR 80 | Temp 98.1°F

## 2016-08-15 DIAGNOSIS — J069 Acute upper respiratory infection, unspecified: Secondary | ICD-10-CM

## 2016-08-15 MED ORDER — FLUTICASONE PROPIONATE 50 MCG/ACT NA SUSP: 2.0000 | Freq: Every day | NASAL | 6 refills | Status: DC

## 2016-08-15 MED ORDER — AMOXICILLIN 875 MG PO TABS: 875.0000 mg | ORAL_TABLET | Freq: Two times a day (BID) | ORAL | 0 refills | Status: DC

## 2016-08-15 MED ORDER — PREDNISONE 10 MG PO TABS: 30.0000 mg | ORAL_TABLET | Freq: Every day | ORAL | 0 refills | Status: DC

## 2016-08-15 NOTE — Progress Notes (Signed)
S: C/o sore throat, cough, and congestion for 1 days, + fever of 101 last night, some chills, no cp/sob, v/d; mucus was yellow this am   Using otc meds: tylenol/motrin, sudafed  O: PE: vitasl wnl, nad, perrl eomi, normocephalic, tms dull, nasal mucosa red and swollen, throat injected, neck supple no lymph, lungs c t a, cv rrr, neuro intact  A:  Acute uri   P: drink fluids, continue regular meds , use otc meds of choice, return if not improving in 5 days, return earlier if worsening, amoxil 875mg  , pred 30mg  qd x 3d, flonase

## 2016-09-01 ENCOUNTER — Ambulatory Visit (INDEPENDENT_AMBULATORY_CARE_PROVIDER_SITE_OTHER): Payer: 59 | Admitting: Obstetrics and Gynecology

## 2016-09-01 ENCOUNTER — Encounter: Payer: Self-pay | Admitting: Obstetrics and Gynecology

## 2016-09-01 ENCOUNTER — Other Ambulatory Visit: Payer: Self-pay | Admitting: Obstetrics and Gynecology

## 2016-09-01 VITALS — BP 116/71 | HR 72 | Ht 68.5 in | Wt 215.3 lb

## 2016-09-01 DIAGNOSIS — Z01411 Encounter for gynecological examination (general) (routine) with abnormal findings: Secondary | ICD-10-CM | POA: Diagnosis not present

## 2016-09-01 DIAGNOSIS — N926 Irregular menstruation, unspecified: Secondary | ICD-10-CM | POA: Diagnosis not present

## 2016-09-01 DIAGNOSIS — Z01419 Encounter for gynecological examination (general) (routine) without abnormal findings: Secondary | ICD-10-CM | POA: Diagnosis not present

## 2016-09-01 NOTE — Patient Instructions (Signed)
 Preventive Care 18-39 Years, Female Preventive care refers to lifestyle choices and visits with your health care provider that can promote health and wellness. What does preventive care include?  A yearly physical exam. This is also called an annual well check.  Dental exams once or twice a year.  Routine eye exams. Ask your health care provider how often you should have your eyes checked.  Personal lifestyle choices, including:  Daily care of your teeth and gums.  Regular physical activity.  Eating a healthy diet.  Avoiding tobacco and drug use.  Limiting alcohol use.  Practicing safe sex.  Taking vitamin and mineral supplements as recommended by your health care provider. What happens during an annual well check? The services and screenings done by your health care provider during your annual well check will depend on your age, overall health, lifestyle risk factors, and family history of disease. Counseling  Your health care provider may ask you questions about your:  Alcohol use.  Tobacco use.  Drug use.  Emotional well-being.  Home and relationship well-being.  Sexual activity.  Eating habits.  Work and work environment.  Method of birth control.  Menstrual cycle.  Pregnancy history. Screening  You may have the following tests or measurements:  Height, weight, and BMI.  Diabetes screening. This is done by checking your blood sugar (glucose) after you have not eaten for a while (fasting).  Blood pressure.  Lipid and cholesterol levels. These may be checked every 5 years starting at age 20.  Skin check.  Hepatitis C blood test.  Hepatitis B blood test.  Sexually transmitted disease (STD) testing.  BRCA-related cancer screening. This may be done if you have a family history of breast, ovarian, tubal, or peritoneal cancers.  Pelvic exam and Pap test. This may be done every 3 years starting at age 21. Starting at age 30, this may be done  every 5 years if you have a Pap test in combination with an HPV test. Discuss your test results, treatment options, and if necessary, the need for more tests with your health care provider. Vaccines  Your health care provider may recommend certain vaccines, such as:  Influenza vaccine. This is recommended every year.  Tetanus, diphtheria, and acellular pertussis (Tdap, Td) vaccine. You may need a Td booster every 10 years.  Varicella vaccine. You may need this if you have not been vaccinated.  HPV vaccine. If you are 26 or younger, you may need three doses over 6 months.  Measles, mumps, and rubella (MMR) vaccine. You may need at least one dose of MMR. You may also need a second dose.  Pneumococcal 13-valent conjugate (PCV13) vaccine. You may need this if you have certain conditions and were not previously vaccinated.  Pneumococcal polysaccharide (PPSV23) vaccine. You may need one or two doses if you smoke cigarettes or if you have certain conditions.  Meningococcal vaccine. One dose is recommended if you are age 19-21 years and a first-year college student living in a residence hall, or if you have one of several medical conditions. You may also need additional booster doses.  Hepatitis A vaccine. You may need this if you have certain conditions or if you travel or work in places where you may be exposed to hepatitis A.  Hepatitis B vaccine. You may need this if you have certain conditions or if you travel or work in places where you may be exposed to hepatitis B.  Haemophilus influenzae type b (Hib) vaccine. You may need   this if you have certain risk factors. Talk to your health care provider about which screenings and vaccines you need and how often you need them. This information is not intended to replace advice given to you by your health care provider. Make sure you discuss any questions you have with your health care provider. Document Released: 10/18/2001 Document Revised:  05/11/2016 Document Reviewed: 06/23/2015 Elsevier Interactive Patient Education  2017 Elsevier Inc.  

## 2016-09-01 NOTE — Progress Notes (Signed)
  Subjective:     Jennifer Morse is a 25 y.o. female and is here for a comprehensive physical exam. The patient reports problems - irregular menses, lat one in November (first one since delivery of infant). Using condoms.  Social History   Social History  . Marital status: Married    Spouse name: N/A  . Number of children: N/A  . Years of education: N/A   Occupational History  . Not on file.   Social History Main Topics  . Smoking status: Former Smoker    Packs/day: 0.25    Types: Cigarettes    Quit date: 02/26/2015  . Smokeless tobacco: Never Used  . Alcohol use 0.6 oz/week    1 Glasses of wine per week  . Drug use: No  . Sexual activity: Yes    Partners: Male    Birth control/ protection: None   Other Topics Concern  . Not on file   Social History Narrative  . No narrative on file   Health Maintenance  Topic Date Due  . PAP SMEAR  10/31/2015  . INFLUENZA VACCINE  04/05/2016  . TETANUS/TDAP  04/08/2025  . HIV Screening  Completed    The following portions of the patient's history were reviewed and updated as appropriate: allergies, current medications, past family history, past medical history, past social history, past surgical history and problem list.  Review of Systems Pertinent items noted in HPI and remainder of comprehensive ROS otherwise negative.   Objective:    General appearance: alert, cooperative and appears stated age Neck: no adenopathy, no carotid bruit, no JVD, supple, symmetrical, trachea midline and thyroid not enlarged, symmetric, no tenderness/mass/nodules Lungs: clear to auscultation bilaterally Breasts: normal appearance, no masses or tenderness Heart: regular rate and rhythm, S1, S2 normal, no murmur, click, rub or gallop Abdomen: soft, non-tender; bowel sounds normal; no masses,  no organomegaly Pelvic: cervix normal in appearance, external genitalia normal, no adnexal masses or tenderness, no cervical motion tenderness, rectovaginal  septum normal, uterus normal size, shape, and consistency and vagina normal without discharge    Assessment:    Healthy female exam. Obesity, irregular menses     Plan:  Labs and pap obtained, discussed provera challenge if goes >70months between menses   See After Visit Summary for Counseling Recommendations

## 2016-09-02 LAB — COMPREHENSIVE METABOLIC PANEL
A/G RATIO: 1.7 (ref 1.2–2.2)
ALK PHOS: 76 IU/L (ref 39–117)
ALT: 23 IU/L (ref 0–32)
AST: 19 IU/L (ref 0–40)
Albumin: 4.3 g/dL (ref 3.5–5.5)
BUN/Creatinine Ratio: 12 (ref 9–23)
BUN: 9 mg/dL (ref 6–20)
Bilirubin Total: 0.3 mg/dL (ref 0.0–1.2)
CALCIUM: 9.1 mg/dL (ref 8.7–10.2)
CHLORIDE: 104 mmol/L (ref 96–106)
CO2: 22 mmol/L (ref 18–29)
Creatinine, Ser: 0.76 mg/dL (ref 0.57–1.00)
GFR calc Af Amer: 124 mL/min/{1.73_m2} (ref >59)
GFR, EST NON AFRICAN AMERICAN: 108 mL/min/{1.73_m2} (ref >59)
Globulin, Total: 2.5 g/dL (ref 1.5–4.5)
Glucose: 92 mg/dL (ref 65–99)
POTASSIUM: 4.2 mmol/L (ref 3.5–5.2)
Sodium: 140 mmol/L (ref 134–144)
Total Protein: 6.8 g/dL (ref 6.0–8.5)

## 2016-09-02 LAB — LIPID PANEL
CHOL/HDL RATIO: 3.3 ratio (ref 0.0–4.4)
CHOLESTEROL TOTAL: 131 mg/dL (ref 100–199)
HDL: 40 mg/dL (ref >39)
LDL Calculated: 76 mg/dL (ref 0–99)
TRIGLYCERIDES: 74 mg/dL (ref 0–149)
VLDL Cholesterol Cal: 15 mg/dL (ref 5–40)

## 2016-09-02 LAB — THYROID PANEL WITH TSH
FREE THYROXINE INDEX: 1.8 (ref 1.2–4.9)
T3 UPTAKE RATIO: 26 % (ref 24–39)
T4, Total: 6.9 ug/dL (ref 4.5–12.0)
TSH: 0.953 u[IU]/mL (ref 0.450–4.500)

## 2016-09-02 LAB — CYTOLOGY - PAP

## 2016-09-05 NOTE — L&D Delivery Note (Signed)
Obstetrical Delivery Note   Date of Delivery:   07/08/2017 Primary OB:   [facility] Gestational Age/EDD: [redacted]w[redacted]d (Dated by 7 week ultrasound) Antepartum complications: migraines, hemorrhoids, mild gestational hypertension  Delivered By:   Dalia Heading, CNM  Delivery Type:   spontaneous vaginal delivery  Procedure Details:   CTSP due to rectal pressure. C/C/+1 to +2. Mother pushed to deliver a vigorous female infant in OA. Baby dried and placed on mother's abdomen. After delayed cord clamping, the cord was clamped x 2 and the cord was cut by the FOB. Baby then placed skin to skin. Placenta and 3 vessel cord delivered spontaneously and intact. Second degree perineal laceration repaired with 2-0 Vicryl and 3-0 Chromic and small right labial laceration repaired with 3-0 Chromic (the right labia minora laceration from previous delivery did not heal well, leaving a separation).  Anesthesia:    epidural Intrapartum complications: None GBS:    negative Laceration:    Second degree perineal and small right labia Episiotomy:    none Placenta:    Via active 3rd stage. To pathology: no Estimated Blood Loss:  350 ml Baby:    Liveborn female, Apgars 9/9, weight pending    Dalia Heading, CNM

## 2016-09-08 ENCOUNTER — Ambulatory Visit: Payer: 59

## 2016-09-08 ENCOUNTER — Ambulatory Visit (INDEPENDENT_AMBULATORY_CARE_PROVIDER_SITE_OTHER): Payer: 59 | Admitting: Obstetrics and Gynecology

## 2016-09-08 VITALS — BP 121/70 | HR 63 | Ht 68.5 in | Wt 210.2 lb

## 2016-09-08 DIAGNOSIS — E669 Obesity, unspecified: Secondary | ICD-10-CM

## 2016-09-08 MED ORDER — CYANOCOBALAMIN 1000 MCG/ML IJ SOLN
1000.0000 ug | Freq: Once | INTRAMUSCULAR | Status: AC
  Administered 2016-09-08: 1000 ug via INTRAMUSCULAR

## 2016-09-08 NOTE — Progress Notes (Signed)
Patient ID: Jennifer Morse, female   DOB: 03/31/1991, 26 y.o.   MRN: [id number] Pt presents for weight, B/P, B-12 injection. No side effects of medication-Phentermine, or B-12.  Weight loss of _5_ lbs. Encouraged eating healthy and exercise.

## 2016-10-05 ENCOUNTER — Ambulatory Visit (INDEPENDENT_AMBULATORY_CARE_PROVIDER_SITE_OTHER): Payer: 59 | Admitting: Obstetrics and Gynecology

## 2016-10-05 DIAGNOSIS — E669 Obesity, unspecified: Secondary | ICD-10-CM

## 2016-10-05 MED ORDER — CYANOCOBALAMIN 1000 MCG/ML IJ SOLN
1000.0000 ug | Freq: Once | INTRAMUSCULAR | Status: AC
  Administered 2016-10-05: 1000 ug via INTRAMUSCULAR

## 2016-10-05 NOTE — Progress Notes (Signed)
Patient ID: Jennifer Morse, female   DOB: 30-Jan-1991, 26 y.o.   MRN: [id number] Pt presents for weight, B/P, B-12 injection. No side effects of medication-Phentermine, or B-12.  Weight loss of 8 lbs. Encouraged eating healthy and exercise.

## 2016-10-06 ENCOUNTER — Ambulatory Visit: Payer: 59

## 2016-10-07 ENCOUNTER — Ambulatory Visit: Payer: 59

## 2016-11-02 ENCOUNTER — Ambulatory Visit: Payer: 59

## 2016-11-03 ENCOUNTER — Ambulatory Visit (INDEPENDENT_AMBULATORY_CARE_PROVIDER_SITE_OTHER): Payer: 59 | Admitting: Obstetrics and Gynecology

## 2016-11-03 VITALS — BP 128/78 | HR 99 | Wt 197.8 lb

## 2016-11-03 DIAGNOSIS — E663 Overweight: Secondary | ICD-10-CM | POA: Diagnosis not present

## 2016-11-03 MED ORDER — CYANOCOBALAMIN 1000 MCG/ML IJ SOLN
1000.0000 ug | Freq: Once | INTRAMUSCULAR | Status: AC
  Administered 2016-11-03: 1000 ug via INTRAMUSCULAR

## 2016-11-03 NOTE — Progress Notes (Signed)
Pt is here for wt, bp check, b-12 inj, she is doing well denies any s/e is exercising  09/01/16 wt 215lb 09/08/16 wt- 210lb

## 2016-11-24 ENCOUNTER — Encounter: Payer: Self-pay | Admitting: Obstetrics and Gynecology

## 2016-11-24 ENCOUNTER — Ambulatory Visit (INDEPENDENT_AMBULATORY_CARE_PROVIDER_SITE_OTHER): Payer: 59 | Admitting: Obstetrics and Gynecology

## 2016-11-24 VITALS — BP 110/71 | HR 73 | Ht 68.0 in | Wt 195.7 lb

## 2016-11-24 DIAGNOSIS — N926 Irregular menstruation, unspecified: Secondary | ICD-10-CM | POA: Diagnosis not present

## 2016-11-24 LAB — POCT URINE PREGNANCY: Preg Test, Ur: POSITIVE — AB

## 2016-11-24 MED ORDER — ONDANSETRON 4 MG PO TBDP: 4.0000 mg | ORAL_TABLET | Freq: Four times a day (QID) | ORAL | 0 refills | Status: DC | PRN

## 2016-11-24 MED ORDER — DOXYLAMINE-PYRIDOXINE 10-10 MG PO TBEC: 2.0000 | DELAYED_RELEASE_TABLET | Freq: Every day | ORAL | 5 refills | Status: DC

## 2016-11-24 NOTE — Progress Notes (Signed)
Subjective:     Patient ID: Jennifer Morse, female   DOB: 20-Mar-1991, 26 y.o.   MRN: [id number]  HPI Reports irregular menses with LMP in November, none in decembe, then LMP 09/21/16, had UPT+ 2 days ago. Nausea on and off for 2 weeks. Feels really tired.  Review of Systems Negative except stated above in HPI    Objective:   Physical Exam A&Ox4 Well groomed female not feeling well Blood pressure 110/71, pulse 73, height 5\' 8"  (1.727 m), weight 195 lb 11.2 oz (88.8 kg), last menstrual period 09/21/2016, currently breastfeeding. UPT+     Assessment:     Missed menses Nausea and vomiting    Plan:     Hcg lab obtained-will follow up accordingly. RX for Diclegis & zofran sent in.  Melody Carlyle, CNM

## 2016-11-25 ENCOUNTER — Telehealth: Payer: Self-pay | Admitting: *Deleted

## 2016-11-25 LAB — BETA HCG QUANT (REF LAB): HCG QUANT: 48947 m[IU]/mL

## 2016-11-25 NOTE — Telephone Encounter (Signed)
Done - [facility]

## 2016-11-25 NOTE — Telephone Encounter (Signed)
pls advise

## 2016-11-25 NOTE — Telephone Encounter (Signed)
Patient called and states that Melody told her that she needed to schedule a U/S. Please confirm and let me know if I need to schedule one. Thank you

## 2016-11-25 NOTE — Telephone Encounter (Signed)
Yes please schedule a viability scan one day next week

## 2016-11-28 ENCOUNTER — Other Ambulatory Visit: Payer: Self-pay | Admitting: Obstetrics and Gynecology

## 2016-11-28 DIAGNOSIS — Z369 Encounter for antenatal screening, unspecified: Secondary | ICD-10-CM

## 2016-11-30 ENCOUNTER — Ambulatory Visit (INDEPENDENT_AMBULATORY_CARE_PROVIDER_SITE_OTHER): Payer: 59

## 2016-11-30 ENCOUNTER — Other Ambulatory Visit: Payer: Self-pay | Admitting: Obstetrics and Gynecology

## 2016-11-30 DIAGNOSIS — Z369 Encounter for antenatal screening, unspecified: Secondary | ICD-10-CM

## 2016-12-20 ENCOUNTER — Other Ambulatory Visit: Payer: Self-pay | Admitting: Certified Nurse Midwife

## 2016-12-20 ENCOUNTER — Ambulatory Visit (INDEPENDENT_AMBULATORY_CARE_PROVIDER_SITE_OTHER): Payer: 59 | Admitting: Certified Nurse Midwife

## 2016-12-20 ENCOUNTER — Encounter: Payer: Self-pay | Admitting: Certified Nurse Midwife

## 2016-12-20 VITALS — BP 104/64 | HR 94 | Wt 193.0 lb

## 2016-12-20 DIAGNOSIS — K6289 Other specified diseases of anus and rectum: Secondary | ICD-10-CM

## 2016-12-20 DIAGNOSIS — Z0283 Encounter for blood-alcohol and blood-drug test: Secondary | ICD-10-CM | POA: Diagnosis not present

## 2016-12-20 DIAGNOSIS — Z363 Encounter for antenatal screening for malformations: Secondary | ICD-10-CM

## 2016-12-20 DIAGNOSIS — Z3490 Encounter for supervision of normal pregnancy, unspecified, unspecified trimester: Secondary | ICD-10-CM | POA: Insufficient documentation

## 2016-12-20 DIAGNOSIS — Z3A09 9 weeks gestation of pregnancy: Secondary | ICD-10-CM

## 2016-12-20 DIAGNOSIS — Z3491 Encounter for supervision of normal pregnancy, unspecified, first trimester: Secondary | ICD-10-CM | POA: Diagnosis not present

## 2016-12-20 MED ORDER — BUTALBITAL-APAP-CAFFEINE 50-325-40 MG PO TABS: 1.0000 | ORAL_TABLET | Freq: Four times a day (QID) | ORAL | 0 refills | Status: DC | PRN

## 2016-12-20 NOTE — Progress Notes (Signed)
NOB history and physical today as well as NOB labs, Aptima and urine culture ROB and NT in 2-3 weeks

## 2016-12-20 NOTE — Progress Notes (Signed)
New Obstetric Patient H&P    Chief Complaint: "Desires prenatal care"   History of Present Illness: Patient is a 26 y.o. G48P1001 Caucasian female, LMP 09/21/2016 presents with amenorrhea and positive home pregnancy test. Based on a 7 week ultrasound 11/30/16 her EDD is 07/19/2017 and her EGA is [redacted]w[redacted]d Cycles are very irregular. She has had 2 menses since her last vaginal delivery 07/01/2015. Her last pap smear was 4 months ago at [facility] and was negative.   She had a urine pregnancy test which was positive approximately 1 month ago. (19th or 20th March). Since her LMP she claims she has experienced nausea and vomiting, breast tenderness, headaches, and worsening constipation and rectal pain with a BM. She averages vomiting 3-4 times a day despite taking Diclegis (4 caps/day). Also takes Zofran occasionally. She has a history of chronic constipation, but since having her baby, has a lot of pain with and after bowel movements..Marland KitchenMarland KitchenIt feels like razor blades". Has used proctofoam, and other rectal creams both prescription and over the counter without relief. She takes 2 colace daily and Miralax daily. HAving some trouble getting in adequate fluids with nausea and vomiting of pregnancy.  She denies vaginal bleeding. Her past medical history is notable for migraines. Her headaches involve the parietal-occipital areas and are accompanied by nausea, photophobia and blurred vision. They seem to come in clusters. Before her pregnancy, she used triptans with relief. Fioricet has been helpful during her last pregnancy. Tylenol is not relieving her headaches. Her prior pregnancies are notable for a spontaneous vaginal delivery of a 8#3oz female at [redacted] weeks gestation over a second degree laceration. She was treated for postpartum depression.   Since her LMP, she admits to the use of tobacco products  No. Former smoker, stopped with first pregnancy. Drank alcohol in January and February. She claims she has  lost a few  pounds since the start of her pregnancy.  There are cats in the home in the home  no  She admits close contact with children on a regular basis  yes  She has had chicken pox in the past had a varivax after her last delivery. Also received a MMR She has had Tuberculosis exposures, symptoms, or previously tested positive for TB   no Current or past history of domestic violence. no  Genetic Screening/Teratology Counseling: (Includes patient, baby's father, or anyone in either family with:)   [facility] 2. Thalassemia ([place] or [place] background): MCV<80  no 3. Neural tube defect (meningomyelocele, spina bifida, anencephaly)  no 4. Congenital heart defect  Her nephew was born with tachycardia requiring treatment  5. Down syndrome  no 6. Tay-Sachs (Jewish, [place]  no 7. Canavan's Disease  no 8. Sickle cell disease or trait (African)  no  9. Hemophilia or other blood disorders  no  10. Muscular dystrophy  no  11. Cystic fibrosis  no  12. Huntington's Chorea  no  13. Mental retardation/autism  no 14. Other inherited genetic or chromosomal disorder  no 15. Maternal metabolic disorder (DM, PKU, etc)  no 16. Patient or FOB with a child with a birth defect not listed above no  16a. Patient or FOB with a birth defect themselves no 17. Recurrent pregnancy loss, or stillbirth  no  18. Any medications since LMP other than prenatal vitamins (include vitamins, supplements, OTC meds, drugs, alcohol)  Diclegis, Colace, Miralax, Tylenol 19. Any other genetic/environmental exposure to  discuss  no  Infection History:   1. Lives with someone with TB or TB exposed  no  2. Patient or partner has history of genital herpes  no 3. Rash or viral illness since LMP  no 4. History of STI (GC, CT, HPV, syphilis, HIV)  no 5. History of recent travel :  no  Other pertinent information:  Edison Nasuti, age 18, is her husband and is supportive      Review of Systems:10 point review of systems negative unless otherwise noted in HPI  Past Medical History:  Past Medical History:  Diagnosis Date  . Frequent headaches   . Migraine with aura, intractable, with status migrainosus 1995  . Oligomenorrhea   . Postpartum depression    with first pregnancy    Past Surgical History:  Past Surgical History:  Procedure Laterality Date  . WISDOM TOOTH EXTRACTION  2012    Gynecologic History: Patient's last menstrual period was 09/21/2016 (exact date).  Obstetric History: G2P1001  Family History:  Family History  Problem Relation Age of Onset  . Diabetes Mother   . Diabetes Sister   . Diabetes Maternal Grandmother   . Heart disease Maternal Grandmother   . Hypertension Maternal Grandmother   . Kidney cancer Maternal Grandmother   . Diabetes Maternal Grandfather   . Heart disease Maternal Grandfather   . Hypertension Maternal Grandfather   . Diabetes Maternal Aunt     Social History:  Social History   Social History  . Marital status: Married    Spouse name: Edison Nasuti  . Number of children: 1  . Years of education: N/A   Occupational History  . RN    Social History Main Topics  . Smoking status: Former Smoker    Packs/day: 0.25    Types: Cigarettes    Quit date: 02/26/2015  . Smokeless tobacco: Never Used  . Alcohol use No  . Drug use: No  . Sexual activity: Yes    Partners: Male    Birth control/ protection: None   Other Topics Concern  . Not on file   Social History Narrative  . No narrative on file    Allergies:  No Known Allergies  Medications: Prior to Admission medications   Medication Sig Start Date End Date Taking? Authorizing Provider  acetaminophen (TYLENOL) 500 MG tablet Take 500 mg by mouth every 6 (six) hours as needed.   Yes Historical Provider, MD  docusate sodium (COLACE) 100 MG capsule Take 100 mg by mouth daily as needed for mild constipation.   Yes Historical Provider, MD   Doxylamine-Pyridoxine (DICLEGIS) 10-10 MG TBEC Take 2 tablets by mouth at bedtime. If symptoms persist, add one tablet in the morning and one in the afternoon 11/24/16  Yes Melody N Shambley, CNM  ondansetron (ZOFRAN ODT) 4 MG disintegrating tablet Take 1 tablet (4 mg total) by mouth every 6 (six) hours as needed for nausea. 11/24/16  Yes Melody N Shambley, CNM  Polyethylene Glycol 3350 (MIRALAX PO)  08/11/16  Yes Historical Provider, MD  prenatal vitamin w/FE, FA (PRENATAL 1 + 1) 27-1 MG TABS tablet Take 1 tablet by mouth daily at 12 noon.   Yes Historical Provider, MD    Physical Exam Vitals: Blood pressure 104/64, pulse 94, weight 193 lb (87.5 kg), last menstrual period 09/21/2016  General: WF in  NAD HEENT: normocephalic, anicteric  OP: tonsils non inflammed Thyroid: no enlargement, no palpable nodules Pulmonary: No increased work of breathing, CTAB Cardiovascular: RRR without murmur Abdomen: NABS, soft,  non-tender, non-distended.  Umbilicus without lesions.  No hepatomegaly. FH palpated 2 FB above symphysis pubis. FHTs 180 with DT.Marland Kitchen No evidence of hernia  Genitourinary:  External: Normal external female genitalia.  Normal urethral meatus, normal Bartholin's and Skene's glands.    Vagina: Normal vaginal mucosa, no masses or tenderness  Cervix: closed, NT,  no bleeding  Uterus: AV, 10 week size, mobile, NT  Adnexa: ovaries non-enlarged, no adnexal masses  Rectal: no fissures seen, no external hemorrhoids Extremities: no edema, erythema, or tenderness Neurologic: Grossly intact Psychiatric: mood appropriate, affect full   Assessment: 26 y.o. G2P1001 at 14w6dpresenting to initiate prenatal care History of PPD Chronic constipation and rectal pain with BM. Migraine headaches  Plan: 1) Avoid alcoholic beverages. 2) Patient encouraged not to smoke.  3) Discontinue the use of all non-medicinal drugs and chemicals. Can continue Miralax, Colace.  4) Take prenatal vitamins daily.  5)  Nutrition, food safety (fish, cheese advisories, and high nitrite foods) and exercise discussed. Handouts given on diet, safe meds during pregnancy, genetic testing 6) Will refer to general surgery to evaluate for rectal fissure 7) Genetic Screening, such as with 1st Trimester Screening, MS AFP testing, and Ultrasound,  is discussed with patient. At the conclusion of today's visit patient requested genetic testing. SHe will return in 2-3 weeks for NT/first trimester test ans ROB. 8) Given RX for Fioricet for migraines  CDalia Heading CNM

## 2016-12-20 NOTE — Progress Notes (Signed)
NOB today. c/o nausea and vomiting.

## 2016-12-21 ENCOUNTER — Encounter: Payer: Self-pay | Admitting: *Deleted

## 2016-12-21 LAB — RPR+RH+ABO+RUB AB+AB SCR+CB...
ANTIBODY SCREEN: NEGATIVE
HIV SCREEN 4TH GENERATION: NONREACTIVE
Hematocrit: 39.3 % (ref 34.0–46.6)
Hemoglobin: 13.1 g/dL (ref 11.1–15.9)
Hepatitis B Surface Ag: NEGATIVE
MCH: 28.3 pg (ref 26.6–33.0)
MCHC: 33.3 g/dL (ref 31.5–35.7)
MCV: 85 fL (ref 79–97)
Platelets: 119 10*3/uL — ABNORMAL LOW (ref 150–379)
RBC: 4.63 x10E6/uL (ref 3.77–5.28)
RDW: 14.2 % (ref 12.3–15.4)
RPR: NONREACTIVE
Rh Factor: POSITIVE
Rubella Antibodies, IGG: 4.61 index (ref >0.99)
Varicella zoster IgG: 609 index (ref >165)
WBC: 6.8 10*3/uL (ref 3.4–10.8)

## 2016-12-22 ENCOUNTER — Encounter: Payer: Self-pay | Admitting: Certified Nurse Midwife

## 2016-12-22 DIAGNOSIS — D696 Thrombocytopenia, unspecified: Secondary | ICD-10-CM | POA: Insufficient documentation

## 2016-12-22 DIAGNOSIS — O99119 Other diseases of the blood and blood-forming organs and certain disorders involving the immune mechanism complicating pregnancy, unspecified trimester: Secondary | ICD-10-CM | POA: Insufficient documentation

## 2016-12-22 LAB — GC/CHLAMYDIA PROBE AMP
CHLAMYDIA, DNA PROBE: NEGATIVE
NEISSERIA GONORRHOEAE BY PCR: NEGATIVE

## 2016-12-22 LAB — URINE CULTURE

## 2016-12-28 ENCOUNTER — Encounter: Payer: Self-pay | Admitting: General Surgery

## 2016-12-28 ENCOUNTER — Ambulatory Visit (INDEPENDENT_AMBULATORY_CARE_PROVIDER_SITE_OTHER): Payer: 59 | Admitting: General Surgery

## 2016-12-28 VITALS — BP 110/68 | HR 84 | Resp 12 | Ht 68.0 in | Wt 198.0 lb

## 2016-12-28 DIAGNOSIS — K602 Anal fissure, unspecified: Secondary | ICD-10-CM | POA: Diagnosis not present

## 2016-12-28 MED ORDER — LIDOCAINE 2 % EX GEL: 1.0000 "application " | Freq: Two times a day (BID) | CUTANEOUS | 1 refills | Status: DC

## 2016-12-28 NOTE — Patient Instructions (Signed)
The patient is aware to call back for any questions or concerns.  Anal Fissure, Adult An anal fissure is a small tear or crack in the skin around the opening of the butt (anus).Bleeding from the tear or crack usually stops on its own within a few minutes. The bleeding may happen every time you poop (have a bowel movement) until the tear or crack heals. Follow these instructions at home: Eating and drinking   Avoid bananas and dairy products. These foods can make it hard to poop.  Drink enough fluid to keep your pee (urine) clear or pale yellow.  Eat a lot of fruit, whole grains, and vegetables. General instructions   Keep the butt area as clean and dry as you can.  Take a warm water bath (sitz bath) as told by your doctor. Do not use soap.  Take over-the-counter and prescription medicines only as told by your doctor.  Use creams or ointments only as told by your doctor.  Keep all follow-up visits as told by your doctor. This is important. Contact a doctor if:  You have more bleeding.  You have a fever.  You have watery poop (diarrhea) that is mixed with blood.  You have pain.  You problem gets worse, not better. This information is not intended to replace advice given to you by your health care provider. Make sure you discuss any questions you have with your health care provider. Document Released: 04/20/2011 Document Revised: 01/28/2016 Document Reviewed: 11/17/2014 Elsevier Interactive Patient Education  2017 [employer].

## 2016-12-28 NOTE — Progress Notes (Signed)
Patient ID: Jennifer Morse, female   DOB: 1991-08-06, 26 y.o.   MRN: [id number]  Chief Complaint  Patient presents with  . Rectal Problems    hemorrhoids    HPI Jennifer Morse is a 26 y.o. female.  Here today for evaluation of hemorrhoids referred by Clarita Leber. .She is currently [redacted] weeks pregnant. She states it feels like razor blades when she has a BM with bleeding. Bowels move twice a week even with colace and miralax. She states the analpram cream is not helping much. She tried protofoam cream and OTC creams as well. Blood in the bowel and in the water. She describes her stool as thin like ribbon. She states before her first child she would not have a regular BM. Then after her daughter she was not regular and it was painful to have a BM. She was born Oct 2016.  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   HPI  Past Medical History:  Diagnosis Date  . Frequent headaches   . Migraine with aura, intractable, with status migrainosus 1995  . Oligomenorrhea   . Postpartum depression    with first pregnancy    Past Surgical History:  Procedure Laterality Date  . WISDOM TOOTH EXTRACTION  2012    Family History  Problem Relation Age of Onset  . Diabetes Mother   . Diabetes Sister   . Diabetes Maternal Grandmother   . Heart disease Maternal Grandmother   . Hypertension Maternal Grandmother   . Kidney cancer Maternal Grandmother   . Diabetes Maternal Grandfather   . Heart disease Maternal Grandfather   . Hypertension Maternal Grandfather   . Diabetes Maternal Aunt     Social History Social History  Substance Use Topics  . Smoking status: Former Smoker    Packs/day: 0.25    Types: Cigarettes    Quit date: 02/26/2015  . Smokeless tobacco: Former Systems developer    Quit date: 2016  . Alcohol use No    No Known Allergies  Current Outpatient Prescriptions  Medication Sig Dispense Refill  . acetaminophen (TYLENOL) 500 MG tablet Take 500 mg by mouth every 6  (six) hours as needed.    . butalbital-acetaminophen-caffeine (FIORICET, ESGIC) 50-325-40 MG tablet Take 1-2 tablets by mouth every 6 (six) hours as needed for headache. 30 tablet 0  . docusate sodium (COLACE) 100 MG capsule Take 100 mg by mouth daily as needed for mild constipation.    . Doxylamine-Pyridoxine (DICLEGIS) 10-10 MG TBEC Take 2 tablets by mouth at bedtime. If symptoms persist, add one tablet in the morning and one in the afternoon 100 tablet 5  . ondansetron (ZOFRAN ODT) 4 MG disintegrating tablet Take 1 tablet (4 mg total) by mouth every 6 (six) hours as needed for nausea. 20 tablet 0  . Polyethylene Glycol 3350 (MIRALAX PO)     . pramoxine-hydrocortisone (ANALPRAM HC) cream Apply topically 3 (three) times daily.    . prenatal vitamin w/FE, FA (PRENATAL 1 + 1) 27-1 MG TABS tablet Take 1 tablet by mouth daily at 12 noon.    . Lidocaine 2 % GEL Apply 1 application topically 2 (two) times daily. 30 g 1   No current facility-administered medications for this visit.     Review of Systems Review of Systems  Constitutional: Negative.   Respiratory: Negative.   Cardiovascular: Negative.   Gastrointestinal: Positive for constipation and rectal pain.    Blood pressure 110/68, pulse 84, resp. rate 12, height 5\' 8"  (1.727  m), weight 198 lb (89.8 kg), last menstrual period 09/21/2016, currently breastfeeding.  Physical Exam Physical Exam  Constitutional: She is oriented to person, place, and time. She appears well-developed and well-nourished.  HENT:  Mouth/Throat: Oropharynx is clear and moist.  Eyes: Conjunctivae are normal. No scleral icterus.  Neck: Neck supple.  Cardiovascular: Normal rate, regular rhythm and normal heart sounds.   Pulmonary/Chest: Effort normal and breath sounds normal.  Abdominal: Soft.  Genitourinary: Rectal exam shows fissure.     Genitourinary Comments: Pain right side rectum.Small posterior fissure  Lymphadenopathy:    She has no cervical  adenopathy.  Neurological: She is alert and oriented to person, place, and time.  Skin: Skin is warm and dry.  Psychiatric: Her behavior is normal.       Assessment    Posterior anal fissure.    Plan    Trial fiber supplements to improve stool consistency.  Lidocaine 2% gel to be used prior to bowel movements, Analpram cream 2 times per day.    Patient has been encouraged to give a phone/my chart follow-up in 10-14 days.  Considering her long-standing symptoms, she may require surgical intervention which would be best postponed until her second trimester.   HPI, Physical Exam, Assessment and Plan have been scribed under the direction and in the presence of Robert Bellow, MD.  Karie Fetch, RN  I have completed the exam and reviewed the above documentation for accuracy and completeness.  I agree with the above.  Haematologist has been used and any errors in dictation or transcription are unintentional.  Hervey Ard, M.D., F.A.C.S.  Robert Bellow 12/29/2016, 8:14 PM

## 2016-12-29 DIAGNOSIS — K601 Chronic anal fissure: Secondary | ICD-10-CM | POA: Insufficient documentation

## 2017-01-04 ENCOUNTER — Encounter: Payer: Self-pay | Admitting: Obstetrics and Gynecology

## 2017-01-05 ENCOUNTER — Encounter: Payer: Self-pay | Admitting: General Surgery

## 2017-01-11 ENCOUNTER — Ambulatory Visit (INDEPENDENT_AMBULATORY_CARE_PROVIDER_SITE_OTHER): Payer: 59 | Admitting: Certified Nurse Midwife

## 2017-01-11 ENCOUNTER — Ambulatory Visit (INDEPENDENT_AMBULATORY_CARE_PROVIDER_SITE_OTHER): Payer: 59

## 2017-01-11 VITALS — BP 92/68 | Wt 191.0 lb

## 2017-01-11 DIAGNOSIS — Z3A13 13 weeks gestation of pregnancy: Secondary | ICD-10-CM

## 2017-01-11 DIAGNOSIS — Z3491 Encounter for supervision of normal pregnancy, unspecified, first trimester: Secondary | ICD-10-CM

## 2017-01-11 DIAGNOSIS — D696 Thrombocytopenia, unspecified: Secondary | ICD-10-CM

## 2017-01-11 DIAGNOSIS — Z363 Encounter for antenatal screening for malformations: Secondary | ICD-10-CM

## 2017-01-11 DIAGNOSIS — Z3492 Encounter for supervision of normal pregnancy, unspecified, second trimester: Secondary | ICD-10-CM | POA: Diagnosis not present

## 2017-01-11 DIAGNOSIS — O99119 Other diseases of the blood and blood-forming organs and certain disorders involving the immune mechanism complicating pregnancy, unspecified trimester: Secondary | ICD-10-CM

## 2017-01-11 NOTE — Progress Notes (Signed)
1st trimester screen today. Needs refill on fiorcet

## 2017-01-14 LAB — FIRST TRIMESTER SCREEN W/NT
CRL: 73.8 mm
DIA MOM: 0.86
DIA Value: 163.8 pg/mL
Gest Age-Collect: 13.1 weeks
MATERNAL AGE AT EDD: 28.1 a
NUCHAL TRANSLUCENCY: 2.5 mm
NUMBER OF FETUSES: 1
Nuchal Translucency MoM: 1.38
PAPP-A MoM: 1.21
PAPP-A Value: 1074.1 ng/mL
Test Results:: NEGATIVE
WEIGHT: 191 [lb_av]
hCG MoM: 0.81
hCG Value: 54.8 IU/mL

## 2017-01-15 NOTE — Progress Notes (Signed)
CRL 13wk4d, NT 2.9mm . First trimester test done.  Had called patient to advise of NOB labs including decreased plt count of 119K at time of NOB Refill on Fioricet  For headaches Saw Dr Bary Castilla who found anal fissure. Treating her with lidocaine gel along with Analpram. ROB in 4 weeks.

## 2017-02-17 ENCOUNTER — Ambulatory Visit (INDEPENDENT_AMBULATORY_CARE_PROVIDER_SITE_OTHER): Payer: 59 | Admitting: Certified Nurse Midwife

## 2017-02-17 VITALS — BP 92/62 | Wt 195.0 lb

## 2017-02-17 DIAGNOSIS — Z3A18 18 weeks gestation of pregnancy: Secondary | ICD-10-CM

## 2017-02-17 DIAGNOSIS — Z3492 Encounter for supervision of normal pregnancy, unspecified, second trimester: Secondary | ICD-10-CM

## 2017-02-17 NOTE — Progress Notes (Signed)
Pt reports no problems.   

## 2017-02-20 NOTE — Progress Notes (Signed)
Doing well. Nausea and vomiting resolved First trimester screen negative Anatomy scan in 2 weeks. FH at [facility]

## 2017-03-10 ENCOUNTER — Other Ambulatory Visit: Payer: Self-pay | Admitting: Certified Nurse Midwife

## 2017-03-10 ENCOUNTER — Ambulatory Visit (INDEPENDENT_AMBULATORY_CARE_PROVIDER_SITE_OTHER): Payer: 59

## 2017-03-10 ENCOUNTER — Ambulatory Visit (INDEPENDENT_AMBULATORY_CARE_PROVIDER_SITE_OTHER): Payer: 59 | Admitting: Certified Nurse Midwife

## 2017-03-10 VITALS — BP 98/58 | Wt 201.0 lb

## 2017-03-10 DIAGNOSIS — Z363 Encounter for antenatal screening for malformations: Secondary | ICD-10-CM

## 2017-03-10 DIAGNOSIS — O99119 Other diseases of the blood and blood-forming organs and certain disorders involving the immune mechanism complicating pregnancy, unspecified trimester: Secondary | ICD-10-CM | POA: Diagnosis not present

## 2017-03-10 DIAGNOSIS — D696 Thrombocytopenia, unspecified: Secondary | ICD-10-CM

## 2017-03-10 DIAGNOSIS — Z3492 Encounter for supervision of normal pregnancy, unspecified, second trimester: Secondary | ICD-10-CM

## 2017-03-10 DIAGNOSIS — Z3A21 21 weeks gestation of pregnancy: Secondary | ICD-10-CM

## 2017-03-10 NOTE — Progress Notes (Signed)
Headache this AM. Feeling FM CGA 22wk1d, normal anatomy with anterior placenta. It's a BOY! Repeat platelet count today ROB in 4 weeks

## 2017-03-10 NOTE — Progress Notes (Signed)
Pt c/o headache today. No other problems, anatomy scan today, it's a BOY!

## 2017-03-11 LAB — PLATELET COUNT: Platelets: 153 10*3/uL (ref 150–379)

## 2017-04-07 ENCOUNTER — Encounter: Payer: 59 | Admitting: Certified Nurse Midwife

## 2017-04-13 ENCOUNTER — Ambulatory Visit (INDEPENDENT_AMBULATORY_CARE_PROVIDER_SITE_OTHER): Payer: 59 | Admitting: Advanced Practice Midwife

## 2017-04-13 VITALS — BP 104/62 | Wt 209.0 lb

## 2017-04-13 DIAGNOSIS — Z130 Encounter for screening for diseases of the blood and blood-forming organs and certain disorders involving the immune mechanism: Secondary | ICD-10-CM

## 2017-04-13 DIAGNOSIS — Z3A26 26 weeks gestation of pregnancy: Secondary | ICD-10-CM

## 2017-04-13 DIAGNOSIS — Z131 Encounter for screening for diabetes mellitus: Secondary | ICD-10-CM

## 2017-04-13 DIAGNOSIS — Z113 Encounter for screening for infections with a predominantly sexual mode of transmission: Secondary | ICD-10-CM

## 2017-04-13 NOTE — Progress Notes (Signed)
No vb. No lof.  

## 2017-04-13 NOTE — Progress Notes (Signed)
Having some low back pain/pinching. Reviewed comfort measures. No Ctx's, LOF, VB. 28 week labs nv.

## 2017-04-19 ENCOUNTER — Telehealth: Payer: Self-pay | Admitting: Certified Nurse Midwife

## 2017-04-19 ENCOUNTER — Other Ambulatory Visit: Payer: Self-pay | Admitting: Certified Nurse Midwife

## 2017-04-19 MED ORDER — NYSTATIN 100000 UNIT/GM EX OINT: TOPICAL_OINTMENT | Freq: Two times a day (BID) | CUTANEOUS | Status: DC | PRN

## 2017-04-19 MED ORDER — TERCONAZOLE 0.8 % VA CREA: 1.0000 | TOPICAL_CREAM | Freq: Every day | VAGINAL | 0 refills | Status: DC

## 2017-04-19 NOTE — Telephone Encounter (Signed)
Called and lvm  For patient to confirm the reschedule appt fro Friday 04/27/17.

## 2017-04-19 NOTE — Telephone Encounter (Signed)
-----   Message from Dalia Heading, [place] sent at 04/19/2017 11:27 AM EDT ----- Regarding: appointment Please cancel David's appt on 23 Aug with Dr Kenton Kingfisher and her 1 hour glucola and schedule with me on 24 August at 1100 and reschedule her 1 hour glucola for that day please.

## 2017-04-27 ENCOUNTER — Encounter: Payer: 59 | Admitting: Obstetrics & Gynecology

## 2017-04-27 ENCOUNTER — Other Ambulatory Visit: Payer: 59

## 2017-04-28 ENCOUNTER — Ambulatory Visit (INDEPENDENT_AMBULATORY_CARE_PROVIDER_SITE_OTHER): Payer: 59 | Admitting: Certified Nurse Midwife

## 2017-04-28 ENCOUNTER — Other Ambulatory Visit: Payer: 59

## 2017-04-28 VITALS — BP 92/58 | Wt 209.0 lb

## 2017-04-28 DIAGNOSIS — Z113 Encounter for screening for infections with a predominantly sexual mode of transmission: Secondary | ICD-10-CM

## 2017-04-28 DIAGNOSIS — Z3A28 28 weeks gestation of pregnancy: Secondary | ICD-10-CM

## 2017-04-28 DIAGNOSIS — Z130 Encounter for screening for diseases of the blood and blood-forming organs and certain disorders involving the immune mechanism: Secondary | ICD-10-CM | POA: Diagnosis not present

## 2017-04-28 DIAGNOSIS — Z131 Encounter for screening for diabetes mellitus: Secondary | ICD-10-CM

## 2017-04-28 DIAGNOSIS — Z3493 Encounter for supervision of normal pregnancy, unspecified, third trimester: Secondary | ICD-10-CM

## 2017-04-28 NOTE — Progress Notes (Signed)
Pt reports heartburn, taking zantac. Fell on Tuesday and broke her toe, feeling sore. Was looked at on L&D at work and everything ok. 28 wk labs today.

## 2017-04-29 LAB — 28 WEEK RH+PANEL
BASOS: 0 %
Basophils Absolute: 0 10*3/uL (ref 0.0–0.2)
EOS (ABSOLUTE): 0 10*3/uL (ref 0.0–0.4)
EOS: 0 %
Gestational Diabetes Screen: 72 mg/dL (ref 65–139)
HEMATOCRIT: 34.3 % (ref 34.0–46.6)
HEMOGLOBIN: 11.2 g/dL (ref 11.1–15.9)
HIV Screen 4th Generation wRfx: NONREACTIVE
IMMATURE GRANS (ABS): 0 10*3/uL (ref 0.0–0.1)
Immature Granulocytes: 0 %
LYMPHS ABS: 1 10*3/uL (ref 0.7–3.1)
Lymphs: 22 %
MCH: 28.6 pg (ref 26.6–33.0)
MCHC: 32.7 g/dL (ref 31.5–35.7)
MCV: 88 fL (ref 79–97)
MONOCYTES: 8 %
MONOS ABS: 0.4 10*3/uL (ref 0.1–0.9)
NEUTROS ABS: 3.1 10*3/uL (ref 1.4–7.0)
Neutrophils: 70 %
Platelets: 137 10*3/uL — ABNORMAL LOW (ref 150–379)
RBC: 3.91 x10E6/uL (ref 3.77–5.28)
RDW: 13.4 % (ref 12.3–15.4)
RPR: NONREACTIVE
WBC: 4.5 10*3/uL (ref 3.4–10.8)

## 2017-04-30 ENCOUNTER — Encounter: Payer: Self-pay | Admitting: Certified Nurse Midwife

## 2017-04-30 ENCOUNTER — Other Ambulatory Visit: Payer: Self-pay | Admitting: Certified Nurse Midwife

## 2017-04-30 NOTE — Progress Notes (Signed)
28wk2d gestation and having 28 week labs today Broke second toe on right foot-slightly swollen, but feeling better. Baby active. Breast feeding planned. Discussed CCBB ROB and TDAP in 2 weeks

## 2017-05-01 ENCOUNTER — Other Ambulatory Visit: Payer: Self-pay | Admitting: Certified Nurse Midwife

## 2017-05-01 MED ORDER — HYDROCORTISONE ACE-PRAMOXINE 2.5-1 % RE CREA: 1.0000 "application " | TOPICAL_CREAM | Freq: Four times a day (QID) | RECTAL | 1 refills | Status: DC

## 2017-05-01 MED ORDER — LIDOCAINE 2 % EX GEL: 1.0000 "application " | Freq: Two times a day (BID) | CUTANEOUS | 1 refills | Status: DC

## 2017-05-01 MED ORDER — BUTALBITAL-APAP-CAFFEINE 50-325-40 MG PO TABS: 1.0000 | ORAL_TABLET | Freq: Four times a day (QID) | ORAL | 0 refills | Status: DC | PRN

## 2017-05-03 ENCOUNTER — Ambulatory Visit (INDEPENDENT_AMBULATORY_CARE_PROVIDER_SITE_OTHER): Payer: 59 | Admitting: General Surgery

## 2017-05-03 ENCOUNTER — Encounter: Payer: Self-pay | Admitting: General Surgery

## 2017-05-03 VITALS — BP 116/62 | HR 98 | Resp 12 | Ht 68.0 in | Wt 214.0 lb

## 2017-05-03 DIAGNOSIS — K645 Perianal venous thrombosis: Secondary | ICD-10-CM

## 2017-05-03 NOTE — Patient Instructions (Addendum)
The patient is aware to call back for any questions or concerns. Continue to use rectal creams and ice packs as needed for comfort.

## 2017-05-03 NOTE — Progress Notes (Signed)
Patient ID: Jennifer Morse, female   DOB: 1991/01/08, 26 y.o.   MRN: [id number]  Chief Complaint  Patient presents with  . Rectal Problems    hemorrhoid    HPI Jennifer Morse is a 26 y.o. female.  Here for evaluation of a possible hemorrhoid. She is having rectal pain and bleeding. She noticed the hemorrhoid Saturday. She states it has gotten smaller. She has used several creams, proctofoam , analpram and lidocaine gel. She has used OTC preparation H and sitz bath as well. She states it started as a plum size and now grape size.  She is currently [redacted] weeks pregnant. 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   HPI  Past Medical History:  Diagnosis Date  . Frequent headaches   . Migraine with aura, intractable, with status migrainosus 1995  . Oligomenorrhea   . Postpartum depression    with first pregnancy    Past Surgical History:  Procedure Laterality Date  . WISDOM TOOTH EXTRACTION  2012    Family History  Problem Relation Age of Onset  . Diabetes Mother   . Diabetes Sister   . Diabetes Maternal Grandmother   . Heart disease Maternal Grandmother   . Hypertension Maternal Grandmother   . Kidney cancer Maternal Grandmother   . Diabetes Maternal Grandfather   . Heart disease Maternal Grandfather   . Hypertension Maternal Grandfather   . Diabetes Maternal Aunt     Social History Social History  Substance Use Topics  . Smoking status: Former Smoker    Packs/day: 0.25    Types: Cigarettes    Quit date: 02/26/2015  . Smokeless tobacco: Former Systems developer    Quit date: 2016  . Alcohol use No    No Known Allergies  Current Outpatient Prescriptions  Medication Sig Dispense Refill  . acetaminophen (TYLENOL) 500 MG tablet Take 500 mg by mouth every 6 (six) hours as needed.    . butalbital-acetaminophen-caffeine (FIORICET, ESGIC) 50-325-40 MG tablet Take 1-2 tablets by mouth every 6 (six) hours as needed for headache. 30  tablet 0  . docusate sodium (COLACE) 100 MG capsule Take 100 mg by mouth daily as needed for mild constipation.    . Doxylamine-Pyridoxine (DICLEGIS) 10-10 MG TBEC Take 2 tablets by mouth at bedtime. If symptoms persist, add one tablet in the morning and one in the afternoon 100 tablet 5  . hydrocortisone-pramoxine (ANALPRAM HC) 2.5-1 % rectal cream Place 1 application rectally 4 (four) times daily. 30 g 1  . Lidocaine 2 % GEL Apply 1 application topically 2 (two) times daily. 30 g 1  . ondansetron (ZOFRAN ODT) 4 MG disintegrating tablet Take 1 tablet (4 mg total) by mouth every 6 (six) hours as needed for nausea. 20 tablet 0  . Polyethylene Glycol 3350 (MIRALAX PO)     . prenatal vitamin w/FE, FA (PRENATAL 1 + 1) 27-1 MG TABS tablet Take 1 tablet by mouth daily at 12 noon.    . ranitidine (ZANTAC) 150 MG tablet Take 150 mg by mouth at bedtime.    Marland Kitchen terconazole (TERAZOL 3) 0.8 % vaginal cream Place 1 applicator vaginally at bedtime. 20 g 0   Current Facility-Administered Medications  Medication Dose Route Frequency Provider Last Rate Last Dose  . nystatin ointment (MYCOSTATIN)   Topical BID PRN Dalia Heading, CNM        Review of Systems Review of Systems  Constitutional: Negative.   Respiratory: Negative.  Cardiovascular: Negative.   Neurological: Positive for headaches.    Blood pressure 116/62, pulse 98, resp. rate 12, height 5\' 8"  (1.727 m), weight 214 lb (97.1 kg), last menstrual period 09/21/2016, currently breastfeeding.  Physical Exam Physical Exam  Constitutional: She is oriented to person, place, and time. She appears well-developed and well-nourished.  HENT:  Mouth/Throat: Oropharynx is clear and moist.  Eyes: Conjunctivae are normal. No scleral icterus.  Neck: Neck supple.  Genitourinary: Rectal exam shows external hemorrhoid.     Genitourinary Comments: 1.5 cm right thrombosed hemorrhoid  Lymphadenopathy:    She has no cervical adenopathy.  Neurological: She  is alert and oriented to person, place, and time.  Skin: Skin is warm and dry.  Psychiatric: Her behavior is normal.       Assessment    Resolving thrombosed external hemorrhoid.    Plan    No indication for incision and drainage based on small size and marked improvement in the last 24-48 hours.  Patient is very concerned about the possibility of developing symptomatic hemorrhoids during delivery. We'll bladder to see her if this occurs. No intervention outside of I&D would be considered for 6 weeks postpartum due to the marked pelvic congestion.    Follow up after delivery of her child as needed. Continue to use rectal creams and ice packs as needed for comfort.   HPI, Physical Exam, Assessment and Plan have been scribed under the direction and in the presence of Robert Bellow, MD. Karie Fetch, RN  I have completed the exam and reviewed the above documentation for accuracy and completeness.  I agree with the above.  Haematologist has been used and any errors in dictation or transcription are unintentional.  Hervey Ard, M.D., F.A.C.S.  Robert Bellow 05/04/2017, 7:17 AM

## 2017-05-04 DIAGNOSIS — K645 Perianal venous thrombosis: Secondary | ICD-10-CM | POA: Insufficient documentation

## 2017-05-15 ENCOUNTER — Ambulatory Visit (INDEPENDENT_AMBULATORY_CARE_PROVIDER_SITE_OTHER): Payer: 59 | Admitting: Certified Nurse Midwife

## 2017-05-15 VITALS — BP 90/60 | Wt 213.0 lb

## 2017-05-15 DIAGNOSIS — Z3A30 30 weeks gestation of pregnancy: Secondary | ICD-10-CM

## 2017-05-15 DIAGNOSIS — Z23 Encounter for immunization: Secondary | ICD-10-CM

## 2017-05-15 DIAGNOSIS — O99119 Other diseases of the blood and blood-forming organs and certain disorders involving the immune mechanism complicating pregnancy, unspecified trimester: Secondary | ICD-10-CM

## 2017-05-15 DIAGNOSIS — Z3403 Encounter for supervision of normal first pregnancy, third trimester: Secondary | ICD-10-CM

## 2017-05-15 DIAGNOSIS — D696 Thrombocytopenia, unspecified: Secondary | ICD-10-CM

## 2017-05-15 NOTE — Progress Notes (Signed)
Pt c/o difficulty sleeping. tdap and blood consent today. Pt plans for flu shot this week at work.

## 2017-05-15 NOTE — Progress Notes (Signed)
ROB at 30wk5d. Good FM. Hemorrhoidal pain has decreased with analpram and Lidocaine topically. Reviewed 28 week labs. Platelet count decreased slightly to 137K. Repeat platelet count in 2 weeks TDAP today. Signed BT consent form ROB in 2 weeks Dalia Heading, CNM

## 2017-05-16 ENCOUNTER — Telehealth: Payer: Self-pay

## 2017-05-16 NOTE — Telephone Encounter (Signed)
FMLA/DISABILITY form for [employer] filled out and given to [place] for processing.

## 2017-05-22 ENCOUNTER — Other Ambulatory Visit: Payer: Self-pay | Admitting: Certified Nurse Midwife

## 2017-05-22 MED ORDER — AMOXICILLIN-POT CLAVULANATE 500-125 MG PO TABS: 500.0000 mg | ORAL_TABLET | Freq: Two times a day (BID) | ORAL | 0 refills | Status: DC

## 2017-05-22 MED ORDER — AMOXICILLIN-POT CLAVULANATE 500-125 MG PO TABS: 1.0000 | ORAL_TABLET | Freq: Three times a day (TID) | ORAL | 0 refills | Status: DC

## 2017-05-23 ENCOUNTER — Telehealth: Payer: Self-pay

## 2017-05-23 NOTE — Telephone Encounter (Signed)
Pt called after hour nurse last night c/o 33wks, sinus issues x1wk, h/a, stuffed up/runny nose, taking sudafed, flonase, tylenol around the clock, not getting better.  [phone]  Msg given to front desk to sched appt.

## 2017-05-29 ENCOUNTER — Encounter: Payer: 59 | Admitting: Obstetrics and Gynecology

## 2017-06-02 ENCOUNTER — Ambulatory Visit (INDEPENDENT_AMBULATORY_CARE_PROVIDER_SITE_OTHER): Payer: 59 | Admitting: Certified Nurse Midwife

## 2017-06-02 VITALS — BP 102/62 | Wt 213.0 lb

## 2017-06-02 DIAGNOSIS — Z3493 Encounter for supervision of normal pregnancy, unspecified, third trimester: Secondary | ICD-10-CM

## 2017-06-02 DIAGNOSIS — O99113 Other diseases of the blood and blood-forming organs and certain disorders involving the immune mechanism complicating pregnancy, third trimester: Secondary | ICD-10-CM | POA: Diagnosis not present

## 2017-06-02 DIAGNOSIS — Z3A33 33 weeks gestation of pregnancy: Secondary | ICD-10-CM

## 2017-06-02 DIAGNOSIS — D696 Thrombocytopenia, unspecified: Secondary | ICD-10-CM

## 2017-06-02 NOTE — Progress Notes (Signed)
ROB visit at [facility] Was treated for a sinus infection with Augmentin with improvement in symptoms. Having occasional BH contractions, some painful Baby active Repeat plt count today ROB in 2 weeks

## 2017-06-02 NOTE — Progress Notes (Signed)
Pt reports no problems.   

## 2017-06-03 LAB — PLATELET COUNT: PLATELETS: 161 10*3/uL (ref 150–379)

## 2017-06-12 DIAGNOSIS — Z3483 Encounter for supervision of other normal pregnancy, third trimester: Secondary | ICD-10-CM | POA: Diagnosis not present

## 2017-06-12 DIAGNOSIS — Z3482 Encounter for supervision of other normal pregnancy, second trimester: Secondary | ICD-10-CM | POA: Diagnosis not present

## 2017-06-15 ENCOUNTER — Ambulatory Visit (INDEPENDENT_AMBULATORY_CARE_PROVIDER_SITE_OTHER): Payer: 59 | Admitting: Certified Nurse Midwife

## 2017-06-15 ENCOUNTER — Encounter: Payer: Self-pay | Admitting: Certified Nurse Midwife

## 2017-06-15 VITALS — Wt 214.0 lb

## 2017-06-15 DIAGNOSIS — Z3A35 35 weeks gestation of pregnancy: Secondary | ICD-10-CM

## 2017-06-15 DIAGNOSIS — Z3493 Encounter for supervision of normal pregnancy, unspecified, third trimester: Secondary | ICD-10-CM

## 2017-06-15 MED ORDER — CLOBETASOL PROPIONATE 0.05 % EX CREA: 1.0000 "application " | TOPICAL_CREAM | Freq: Two times a day (BID) | CUTANEOUS | 0 refills | Status: DC | PRN

## 2017-06-15 NOTE — Progress Notes (Signed)
Pt c/o braxton hicks ctx and feeling lower pelvic pressure.

## 2017-06-17 NOTE — Progress Notes (Signed)
ROB at 35wk1d. Having more BH contractions and pelvic pressure. Baby active. No bleeding or LOF Platalet count normal at 33 weeks: 161K. Cervix: closed/ 50%/-1/ posterior/ vertex ROB in [redacted] week along with GBS Repeat platelet count at 37 weeks Advised to pre register.  Dalia Heading, CNM

## 2017-06-22 ENCOUNTER — Ambulatory Visit (INDEPENDENT_AMBULATORY_CARE_PROVIDER_SITE_OTHER): Payer: 59 | Admitting: Certified Nurse Midwife

## 2017-06-22 VITALS — BP 98/58 | Wt 215.0 lb

## 2017-06-22 DIAGNOSIS — Z3A36 36 weeks gestation of pregnancy: Secondary | ICD-10-CM

## 2017-06-22 DIAGNOSIS — Z3685 Encounter for antenatal screening for Streptococcus B: Secondary | ICD-10-CM | POA: Diagnosis not present

## 2017-06-22 DIAGNOSIS — Z3493 Encounter for supervision of normal pregnancy, unspecified, third trimester: Secondary | ICD-10-CM

## 2017-06-22 NOTE — Progress Notes (Signed)
ROB at 36wk1d. Having more contractions and increased vulvovaginal pressure and pain Baby active. NO vaginal bleeding or LOF Cervix: 2/60%/-1/posterior/vertex GBS done. Labor precautions Platelet count next visit.

## 2017-06-22 NOTE — Progress Notes (Signed)
Pt reports vaginal pain and pressure. GBS today.

## 2017-06-24 LAB — STREP GP B NAA: STREP GROUP B AG: NEGATIVE

## 2017-06-30 ENCOUNTER — Ambulatory Visit (INDEPENDENT_AMBULATORY_CARE_PROVIDER_SITE_OTHER): Payer: 59 | Admitting: Obstetrics and Gynecology

## 2017-06-30 VITALS — BP 102/74 | Wt 217.0 lb

## 2017-06-30 DIAGNOSIS — O99119 Other diseases of the blood and blood-forming organs and certain disorders involving the immune mechanism complicating pregnancy, unspecified trimester: Secondary | ICD-10-CM

## 2017-06-30 DIAGNOSIS — Z3493 Encounter for supervision of normal pregnancy, unspecified, third trimester: Secondary | ICD-10-CM

## 2017-06-30 DIAGNOSIS — Z3A37 37 weeks gestation of pregnancy: Secondary | ICD-10-CM | POA: Diagnosis not present

## 2017-06-30 DIAGNOSIS — D696 Thrombocytopenia, unspecified: Secondary | ICD-10-CM

## 2017-06-30 NOTE — Progress Notes (Signed)
ROB Pt has lots of vaginal pressure, and braxton hicks

## 2017-06-30 NOTE — Progress Notes (Signed)
    Routine Prenatal Care Visit  Subjective  Jennifer Morse is a 26 y.o. G2P1001 at [redacted]w[redacted]d being seen today for ongoing prenatal care.  She is currently monitored for the following issues for this low-risk pregnancy and has Migraine; Supervision of low-risk pregnancy; Thrombocytopenia affecting pregnancy, antepartum ([place]); and External thrombosed hemorrhoids on her problem list.  ----------------------------------------------------------------------------------- Patient reports no complaints.   Contractions: Irregular. Vag. Bleeding: None.  Movement: Present. Denies leaking of fluid.  ----------------------------------------------------------------------------------- The following portions of the patient's history were reviewed and updated as appropriate: allergies, current medications, past family history, past medical history, past social history, past surgical history and problem list. Problem list updated.   Objective  Blood pressure 102/74, weight 217 lb (98.4 kg), last menstrual period 09/21/2016, currently breastfeeding. Pregravid weight 193 lb (87.5 kg) Total Weight Gain 24 lb (10.9 kg) Urinalysis:      Fetal Status: Fetal Heart Rate (bpm): 145 Fundal Height: 38 cm Movement: Present  Presentation: Vertex  General:  Alert, oriented and cooperative. Patient is in no acute distress.  Skin: Skin is warm and dry. No rash noted.   Cardiovascular: Normal heart rate noted  Respiratory: Normal respiratory effort, no problems with respiration noted  Abdomen: Soft, gravid, appropriate for gestational age. Pain/Pressure: Present     Pelvic:  Cervical exam performed Dilation: 1.5 Effacement (%): 50 Station: -2  Extremities: Normal range of motion.     ental Status: Normal mood and affect. Normal behavior. Normal judgment and thought content.     Assessment   26 y.o. G2P1001 at [redacted]w[redacted]d by  07/19/2017, by Ultrasound presenting for routine prenatal visit  Plan   pregnancy #2 Problems  (from 12/20/16 to present)    No problems associated with this episode.       Term labor symptoms and general obstetric precautions including but not limited to vaginal bleeding, contractions, leaking of fluid and fetal movement were reviewed in detail with the patient. Please refer to After Visit Summary for other counseling recommendations.  -repeat CBC secondary to thrombocytopenia  Return in about 1 week (around 07/07/2017).

## 2017-07-01 ENCOUNTER — Encounter: Payer: Self-pay | Admitting: Obstetrics and Gynecology

## 2017-07-01 LAB — CBC
Hematocrit: 33.6 % — ABNORMAL LOW (ref 34.0–46.6)
Hemoglobin: 11.2 g/dL (ref 11.1–15.9)
MCH: 28.3 pg (ref 26.6–33.0)
MCHC: 33.3 g/dL (ref 31.5–35.7)
MCV: 85 fL (ref 79–97)
PLATELETS: 142 10*3/uL — AB (ref 150–379)
RBC: 3.96 x10E6/uL (ref 3.77–5.28)
RDW: 14 % (ref 12.3–15.4)
WBC: 6.6 10*3/uL (ref 3.4–10.8)

## 2017-07-06 ENCOUNTER — Ambulatory Visit (INDEPENDENT_AMBULATORY_CARE_PROVIDER_SITE_OTHER): Payer: 59 | Admitting: Certified Nurse Midwife

## 2017-07-06 VITALS — BP 92/60 | Wt 216.0 lb

## 2017-07-06 DIAGNOSIS — Z3A38 38 weeks gestation of pregnancy: Secondary | ICD-10-CM

## 2017-07-06 DIAGNOSIS — Z3493 Encounter for supervision of normal pregnancy, unspecified, third trimester: Secondary | ICD-10-CM

## 2017-07-06 NOTE — Progress Notes (Signed)
ROB Pt has vaginal pressure, braxton hicks, and lower back pain

## 2017-07-06 NOTE — Progress Notes (Signed)
ROB at 23 wk1 day. Having contractions every 7-10 minutes apart since early AM. Woke her from sleep this AM.  No LOF. No vaginal bleeding. Lots of pressure. Cervix: posterior and difficult to reach-at least 3 cm/50%/-1/ bloody show on examiners glove. FHTs: WNL. EFW: 8# Plt count last week was 142K Labor precautions/ ROB in 1 week. Dalia Heading, CNM

## 2017-07-08 ENCOUNTER — Inpatient Hospital Stay
Admission: EM | Admit: 2017-07-08 | Discharge: 2017-07-10 | DRG: 807 | Disposition: A | Discharge status: Home / Self Care | Payer: 59 | Attending: Obstetrics and Gynecology | Admitting: Obstetrics and Gynecology

## 2017-07-08 ENCOUNTER — Encounter: Payer: Self-pay | Admitting: Anesthesiology

## 2017-07-08 ENCOUNTER — Inpatient Hospital Stay: Payer: 59 | Admitting: Anesthesiology

## 2017-07-08 DIAGNOSIS — O9902 Anemia complicating childbirth: Secondary | ICD-10-CM | POA: Diagnosis present

## 2017-07-08 DIAGNOSIS — Z87891 Personal history of nicotine dependence: Secondary | ICD-10-CM

## 2017-07-08 DIAGNOSIS — O9912 Other diseases of the blood and blood-forming organs and certain disorders involving the immune mechanism complicating childbirth: Secondary | ICD-10-CM | POA: Diagnosis present

## 2017-07-08 DIAGNOSIS — D6959 Other secondary thrombocytopenia: Secondary | ICD-10-CM | POA: Diagnosis present

## 2017-07-08 DIAGNOSIS — D649 Anemia, unspecified: Secondary | ICD-10-CM | POA: Diagnosis present

## 2017-07-08 DIAGNOSIS — O2243 Hemorrhoids in pregnancy, third trimester: Secondary | ICD-10-CM | POA: Diagnosis present

## 2017-07-08 DIAGNOSIS — Z3A38 38 weeks gestation of pregnancy: Secondary | ICD-10-CM | POA: Diagnosis not present

## 2017-07-08 DIAGNOSIS — O134 Gestational [pregnancy-induced] hypertension without significant proteinuria, complicating childbirth: Secondary | ICD-10-CM | POA: Diagnosis not present

## 2017-07-08 HISTORY — DX: Thrombocytopenia, unspecified: D69.6

## 2017-07-08 HISTORY — DX: Thrombocytopenia, unspecified: O99.119

## 2017-07-08 LAB — CBC
HEMATOCRIT: 34.6 % — AB (ref 35.0–47.0)
Hemoglobin: 11.4 g/dL — ABNORMAL LOW (ref 12.0–16.0)
MCH: 27.8 pg (ref 26.0–34.0)
MCHC: 32.9 g/dL (ref 32.0–36.0)
MCV: 84.3 fL (ref 80.0–100.0)
Platelets: 136 10*3/uL — ABNORMAL LOW (ref 150–440)
RBC: 4.11 MIL/uL (ref 3.80–5.20)
RDW: 13.8 % (ref 11.5–14.5)
WBC: 11.2 10*3/uL — ABNORMAL HIGH (ref 3.6–11.0)

## 2017-07-08 LAB — TYPE AND SCREEN
ABO/RH(D): O POS
Antibody Screen: NEGATIVE

## 2017-07-08 MED ORDER — HYDROCODONE-ACETAMINOPHEN 5-325 MG PO TABS
1.0000 | ORAL_TABLET | Freq: Once | ORAL | Status: AC
  Administered 2017-07-08: 1 via ORAL
  Filled 2017-07-08: qty 1

## 2017-07-08 MED ORDER — ACETAMINOPHEN 325 MG PO TABS: 650.0000 mg | ORAL_TABLET | ORAL | Status: DC | PRN

## 2017-07-08 MED ORDER — LIDOCAINE HCL (PF) 1 % IJ SOLN
INTRAMUSCULAR | Status: AC
  Filled 2017-07-08: qty 30

## 2017-07-08 MED ORDER — OXYTOCIN 40 UNITS IN LACTATED RINGERS INFUSION - SIMPLE MED
2.5000 [IU]/h | INTRAVENOUS | Status: DC
  Filled 2017-07-08 (×2): qty 1000

## 2017-07-08 MED ORDER — FENTANYL 2.5 MCG/ML W/ROPIVACAINE 0.15% IN NS 100 ML EPIDURAL (ARMC)
12.0000 mL/h | EPIDURAL | Status: DC
  Administered 2017-07-08 (×2): 12 mL/h via EPIDURAL
  Filled 2017-07-08: qty 100

## 2017-07-08 MED ORDER — LACTATED RINGERS IV SOLN
INTRAVENOUS | Status: DC
  Administered 2017-07-08 (×2): via INTRAVENOUS

## 2017-07-08 MED ORDER — EPHEDRINE 5 MG/ML INJ
10.0000 mg | INTRAVENOUS | Status: DC | PRN
  Filled 2017-07-08: qty 2

## 2017-07-08 MED ORDER — PHENYLEPHRINE 40 MCG/ML (10ML) SYRINGE FOR IV PUSH (FOR BLOOD PRESSURE SUPPORT)
80.0000 ug | PREFILLED_SYRINGE | INTRAVENOUS | Status: DC | PRN
  Filled 2017-07-08: qty 5

## 2017-07-08 MED ORDER — ACETAMINOPHEN 325 MG PO TABS
325.0000 mg | ORAL_TABLET | Freq: Once | ORAL | Status: AC
  Administered 2017-07-08: 325 mg via ORAL
  Filled 2017-07-08: qty 1

## 2017-07-08 MED ORDER — LIDOCAINE-EPINEPHRINE (PF) 1.5 %-1:200000 IJ SOLN
INTRAMUSCULAR | Status: DC | PRN
  Administered 2017-07-08 (×2): 3 mL via PERINEURAL

## 2017-07-08 MED ORDER — LACTATED RINGERS IV SOLN
500.0000 mL | Freq: Once | INTRAVENOUS | Status: AC
  Administered 2017-07-08: 500 mL via INTRAVENOUS

## 2017-07-08 MED ORDER — MISOPROSTOL 200 MCG PO TABS
ORAL_TABLET | ORAL | Status: AC
  Filled 2017-07-08: qty 4

## 2017-07-08 MED ORDER — DIPHENHYDRAMINE HCL 50 MG/ML IJ SOLN: 12.5000 mg | INTRAMUSCULAR | Status: DC | PRN

## 2017-07-08 MED ORDER — LIDOCAINE HCL (PF) 2 % IJ SOLN
INTRAMUSCULAR | Status: DC | PRN
  Administered 2017-07-08: 10 mL via INTRADERMAL

## 2017-07-08 MED ORDER — LACTATED RINGERS IV SOLN: 500.0000 mL | INTRAVENOUS | Status: DC | PRN

## 2017-07-08 MED ORDER — OXYTOCIN BOLUS FROM INFUSION
500.0000 mL | Freq: Once | INTRAVENOUS | Status: AC
  Administered 2017-07-08: 500 mL via INTRAVENOUS

## 2017-07-08 MED ORDER — BUTORPHANOL TARTRATE 1 MG/ML IJ SOLN: 1.0000 mg | INTRAMUSCULAR | Status: DC | PRN

## 2017-07-08 MED ORDER — LIDOCAINE HCL (PF) 1 % IJ SOLN
INTRAMUSCULAR | Status: DC | PRN
  Administered 2017-07-08 (×2): 3 mL

## 2017-07-08 MED ORDER — AMMONIA AROMATIC IN INHA
RESPIRATORY_TRACT | Status: AC
  Filled 2017-07-08: qty 10

## 2017-07-08 MED ORDER — BUPIVACAINE HCL (PF) 0.25 % IJ SOLN
INTRAMUSCULAR | Status: DC | PRN
  Administered 2017-07-08 (×2): 10 mL via EPIDURAL

## 2017-07-08 MED ORDER — OXYTOCIN 10 UNIT/ML IJ SOLN
INTRAMUSCULAR | Status: AC
Start: 2017-07-08 — End: 2017-07-09
  Filled 2017-07-08: qty 2

## 2017-07-08 MED ORDER — ONDANSETRON HCL 4 MG/2ML IJ SOLN
4.0000 mg | Freq: Four times a day (QID) | INTRAMUSCULAR | Status: DC | PRN
  Administered 2017-07-08: 4 mg via INTRAVENOUS
  Filled 2017-07-08: qty 2

## 2017-07-08 MED ORDER — FENTANYL 2.5 MCG/ML W/ROPIVACAINE 0.15% IN NS 100 ML EPIDURAL (ARMC)
EPIDURAL | Status: AC
  Filled 2017-07-08: qty 100

## 2017-07-08 NOTE — Anesthesia Procedure Notes (Signed)
Epidural Patient location during procedure: OB Start time: 07/08/2017 4:12 PM End time: 07/08/2017 4:18 PM  Staffing Anesthesiologist: Alvin Critchley Performed: anesthesiologist   Preanesthetic Checklist Completed: patient identified, site marked, surgical consent, pre-op evaluation, timeout performed, IV checked, risks and benefits discussed and monitors and equipment checked  Epidural Patient position: sitting Prep: Betadine Patient monitoring: heart rate, continuous pulse ox and blood pressure Approach: midline Location: L3-L4 Injection technique: LOR air  Needle:  Needle type: Tuohy  Needle gauge: 18 G Needle length: 9 cm and 9 Catheter type: closed end flexible Catheter size: 20 Guage Test dose: negative and 1.5% lidocaine with Epi 1:200 K  Assessment Sensory level: T8 Events: blood not aspirated, injection not painful, no injection resistance, negative IV test and no paresthesia  Additional Notes Time out called.  Patient placed in sitting position.  Back prepped and draped in sterile fashion.  A skin wheal was made in the L3-L4 interspace with 1% Lidocaine plain.  A 17G Tuohy needle was advanced into the epidural space by a loss of resistance technique.  The epidural catheter was threaded 3 cm and the TD was negative.  The patient tolerated the procedure well.  The epidural was affixed to the back in sterile fashion.Reason for block:procedure for pain

## 2017-07-08 NOTE — Anesthesia Preprocedure Evaluation (Addendum)
Anesthesia Evaluation  Patient identified by MRN, date of birth, ID band Patient awake    Reviewed: Allergy & Precautions, H&P , NPO status , Patient's Chart, lab work & pertinent test results, reviewed documented beta blocker date and time   History of Anesthesia Complications Negative for: history of anesthetic complications  Airway Mallampati: II  TM Distance: >3 FB Neck ROM: full    Dental no notable dental hx. (+) Teeth Intact   Pulmonary neg pulmonary ROS, former smoker,    Pulmonary exam normal breath sounds clear to auscultation       Cardiovascular Exercise Tolerance: Good negative cardio ROS Normal cardiovascular exam Rhythm:regular Rate:Normal     Neuro/Psych  Headaches, PSYCHIATRIC DISORDERS Depression negative psych ROS   GI/Hepatic Neg liver ROS, GERD  Medicated,  Endo/Other  negative endocrine ROS  Renal/GU negative Renal ROS  negative genitourinary   Musculoskeletal   Abdominal   Peds  Hematology negative hematology ROS (+)   Anesthesia Other Findings Past Medical History:   Migraine with aura, intractable, with status m* 1995         Frequent headaches                                           Irregular menses                                             Irregular periods                                            Reproductive/Obstetrics (+) Pregnancy                             Anesthesia Physical  Anesthesia Plan  ASA: II  Anesthesia Plan: Epidural   Post-op Pain Management:    Induction:   PONV Risk Score and Plan:   Airway Management Planned: Natural Airway  Additional Equipment:   Intra-op Plan:   Post-operative Plan:   Informed Consent: I have reviewed the patients History and Physical, chart, labs and discussed the procedure including the risks, benefits and alternatives for the proposed anesthesia with the patient or authorized representative who  has indicated his/her understanding and acceptance.   Dental Advisory Given  Plan Discussed with: Anesthesiologist, CRNA and Surgeon  Anesthesia Plan Comments:        Anesthesia Quick Evaluation

## 2017-07-08 NOTE — H&P (Signed)
OB History & Physical   History of Present Illness:  Chief Complaint:   HPI:  Jennifer Morse is a 26 y.o. G2P1001 female at [redacted]w[redacted]d dated by a 7 week ultrasound.  Her pregnancy has been complicated by migraine headaches, prolapsed hemorrhoids, and mild gestional thrombocytopenia (plt 119K at nadir).  She presents to L&D for evaluation of labor. Had contractions all night while working. Worsened in intensity at 0730 this Am. Has had a bloody mucous discharge  Prenatal care site: Prenatal care at [facility]  Dating 7 week ultrasound Blood type: O POS  Genetic Screen 1 Screen: negative AFP: Quad: NIPS: Antibody: negative  Anatomic US WNL, anterior placenta. Baby boy Rubella: Immune  GTT Early: Third trimester: 72 RPR: negative  Flu vaccine  HBsAg: negative  TDaP vaccine 05/15/2017 Rhogam: HIV: negative  Baby Food Breast  GBS: negative  Contraception  Pap:  Circumcision  Varicella Immune  Pediatrician    Support Person Edison Nasuti, husband                  Maternal Medical History:   Past Medical History:  Diagnosis Date  . Frequent headaches   . Gestational thrombocytopenia ([place])   . Hemorrhoids during pregnancy, antepartum   . Migraine with aura, intractable, with status migrainosus 1995  . Oligomenorrhea   . Postpartum depression    with first pregnancy    Past Surgical History:  Procedure Laterality Date  . WISDOM TOOTH EXTRACTION  2012    No Known Allergies  Prior to Admission medications   Medication Sig Start Date End Date Taking? Authorizing Provider  acetaminophen (TYLENOL) 500 MG tablet Take 500 mg by mouth every 6 (six) hours as needed.    [provider]  Butalbital-APAP-Caffeine 50-300-40 MG CAPS  06/15/17   [provider]  clobetasol cream (TEMOVATE) 5.36 % Apply 1 application topically 2 (two) times daily as needed. 06/15/17   Dalia Heading, CNM  docusate sodium (COLACE) 100 MG  capsule Take 100 mg by mouth daily as needed for mild constipation.    [provider]  Doxylamine-Pyridoxine (DICLEGIS) 10-10 MG TBEC Take 2 tablets by mouth at bedtime. If symptoms persist, add one tablet in the morning and one in the afternoon Patient not taking: Reported on 07/06/2017 11/24/16   Shambley, Melody N, CNM  hydrocortisone-pramoxine (ANALPRAM HC) 2.5-1 % rectal cream Place 1 application rectally 4 (four) times daily. 05/01/17   Dalia Heading, CNM  Lidocaine 2 % GEL Apply 1 application topically 2 (two) times daily. 05/01/17   Dalia Heading, CNM  ondansetron (ZOFRAN ODT) 4 MG disintegrating tablet Take 1 tablet (4 mg total) by mouth every 6 (six) hours as needed for nausea. Patient not taking: Reported on 06/30/2017 11/24/16   Joylene Igo, CNM  Polyethylene Glycol 3350 (MIRALAX PO)  08/11/16   [provider]  prenatal vitamin w/FE, FA (PRENATAL 1 + 1) 27-1 MG TABS tablet Take 1 tablet by mouth daily at 12 noon.    [provider]  ranitidine (ZANTAC) 150 MG tablet Take 150 mg by mouth at bedtime.    [provider]          Social History: She  reports that she quit smoking about 2 years ago. Her smoking use included Cigarettes. She smoked 0.25 packs per day. She quit smokeless tobacco use about 2 years ago. She reports that she does not drink alcohol or use drugs.  Family History: family history includes  Diabetes in her maternal aunt, maternal grandfather, maternal grandmother, mother, and sister; Heart disease in her maternal grandfather and maternal grandmother; Hypertension in her maternal grandfather and maternal grandmother; Kidney cancer in her maternal grandmother.   Review of Systems: Negative x 10 systems reviewed except as noted in the HPI.      Physical Exam:  Vital Signs: BP 116/72 (BP Location: Right Arm)   Pulse 94   Temp 98.4 F (36.9 C) (Oral)   Resp 20   Ht 5\' 8"  (1.727 m)   Wt 216 lb (98 kg)   LMP  09/21/2016 (Exact Date)   BMI 32.84 kg/m  General: gravid WF breathing thru contractions HEENT: normocephalic, atraumatic Heart: regular rate & rhythm.  No murmurs Lungs: clear to auscultation bilaterally Abdomen: soft, gravid, non-tender;  EFW: 8-8 1/2# Pelvic:   External: Normal external female genitalia  Cervix: Dilation: 4.5 / Effacement (%): 60, 70 / Station: -1   Extremities: non-tender, symmetric, trace edema bilaterally.  DTRs: +1  Neurologic: Alert & oriented x 3.     Baseline FHR: 135 with accelerations to 160s, moderate variability  Toco: contractions every 4-5 minutes apart  Results for orders placed or performed during the hospital encounter of 07/08/17 (from the past 24 hour(s))  CBC     Status: Abnormal   Collection Time: 07/08/17  3:08 PM  Result Value Ref Range   WBC 11.2 (H) 3.6 - 11.0 K/uL   RBC 4.11 3.80 - 5.20 MIL/uL   Hemoglobin 11.4 (L) 12.0 - 16.0 g/dL   HCT 34.6 (L) 35.0 - 47.0 %   MCV 84.3 80.0 - 100.0 fL   MCH 27.8 26.0 - 34.0 pg   MCHC 32.9 32.0 - 36.0 g/dL   RDW 13.8 11.5 - 14.5 %   Platelets 136 (L) 150 - 440 K/uL  Type and screen [facility]     Status: None   Collection Time: 07/08/17  3:09 PM  Result Value Ref Range   ABO/RH(D) O POS    Antibody Screen NEG    Sample Expiration 07/11/2017     Assessment:  Jennifer Morse is a 26 y.o. G38P1001 female at [redacted]w[redacted]d in early labor Mild gestational thrombocytopenia Plan:  1. Admit to Labor & Delivery-expectant management for now. Plan to AROM after epidural. 2. CBC, T&S, Clrs, IVF 3. GBS negative 4. Consents obtained. 5. Epidural when available  Dalia Heading  07/08/2017 4:12 PM

## 2017-07-09 LAB — CBC
HCT: 29.1 % — ABNORMAL LOW (ref 35.0–47.0)
HEMOGLOBIN: 9.6 g/dL — AB (ref 12.0–16.0)
MCH: 28.2 pg (ref 26.0–34.0)
MCHC: 33.1 g/dL (ref 32.0–36.0)
MCV: 85.2 fL (ref 80.0–100.0)
PLATELETS: 124 10*3/uL — AB (ref 150–440)
RBC: 3.42 MIL/uL — AB (ref 3.80–5.20)
RDW: 14.3 % (ref 11.5–14.5)
WBC: 10.8 10*3/uL (ref 3.6–11.0)

## 2017-07-09 LAB — RPR: RPR: NONREACTIVE

## 2017-07-09 MED ORDER — FERROUS FUMARATE 324 (106 FE) MG PO TABS
1.0000 | ORAL_TABLET | Freq: Every day | ORAL | Status: DC
  Administered 2017-07-09 – 2017-07-10 (×2): 106 mg via ORAL
  Filled 2017-07-09 (×3): qty 1

## 2017-07-09 MED ORDER — IBUPROFEN 600 MG PO TABS
600.0000 mg | ORAL_TABLET | Freq: Four times a day (QID) | ORAL | Status: DC
  Administered 2017-07-09 – 2017-07-10 (×5): 600 mg via ORAL
  Filled 2017-07-09 (×5): qty 1

## 2017-07-09 MED ORDER — ACETAMINOPHEN 325 MG PO TABS
650.0000 mg | ORAL_TABLET | ORAL | Status: DC | PRN
  Administered 2017-07-09 – 2017-07-10 (×4): 650 mg via ORAL
  Filled 2017-07-09 (×3): qty 2

## 2017-07-09 MED ORDER — OXYCODONE HCL 5 MG PO TABS
5.0000 mg | ORAL_TABLET | ORAL | Status: DC | PRN
  Administered 2017-07-09 – 2017-07-10 (×9): 5 mg via ORAL
  Filled 2017-07-09 (×7): qty 1

## 2017-07-09 MED ORDER — COCONUT OIL OIL
1.0000 | TOPICAL_OIL | Status: DC | PRN
Start: 2017-07-09 — End: 2017-07-10

## 2017-07-09 MED ORDER — OXYCODONE HCL 5 MG PO TABS
ORAL_TABLET | ORAL | Status: AC
  Administered 2017-07-09: 5 mg via ORAL
  Filled 2017-07-09: qty 1

## 2017-07-09 MED ORDER — SIMETHICONE 80 MG PO CHEW
80.0000 mg | CHEWABLE_TABLET | ORAL | Status: DC | PRN
  Administered 2017-07-09: 80 mg via ORAL
  Filled 2017-07-09: qty 1

## 2017-07-09 MED ORDER — IBUPROFEN 600 MG PO TABS
ORAL_TABLET | ORAL | Status: AC
  Administered 2017-07-09: 600 mg via ORAL
  Filled 2017-07-09: qty 1

## 2017-07-09 MED ORDER — ACETAMINOPHEN 325 MG PO TABS
ORAL_TABLET | ORAL | Status: AC
  Filled 2017-07-09: qty 2

## 2017-07-09 MED ORDER — ONDANSETRON HCL 4 MG/2ML IJ SOLN: 4.0000 mg | INTRAMUSCULAR | Status: DC | PRN

## 2017-07-09 MED ORDER — WITCH HAZEL-GLYCERIN EX PADS
1.0000 "application " | MEDICATED_PAD | CUTANEOUS | Status: DC | PRN
  Administered 2017-07-09: 1 via TOPICAL
  Filled 2017-07-09: qty 100

## 2017-07-09 MED ORDER — PRENATAL MULTIVITAMIN CH
1.0000 | ORAL_TABLET | Freq: Every day | ORAL | Status: DC
  Administered 2017-07-09: 1 via ORAL
  Filled 2017-07-09: qty 1

## 2017-07-09 MED ORDER — BENZOCAINE-MENTHOL 20-0.5 % EX AERO: 1.0000 "application " | INHALATION_SPRAY | CUTANEOUS | Status: DC | PRN

## 2017-07-09 MED ORDER — SENNOSIDES-DOCUSATE SODIUM 8.6-50 MG PO TABS
2.0000 | ORAL_TABLET | ORAL | Status: DC
  Administered 2017-07-09: 2 via ORAL
  Filled 2017-07-09: qty 2

## 2017-07-09 MED ORDER — BENZOCAINE-MENTHOL 20-0.5 % EX AERO
INHALATION_SPRAY | CUTANEOUS | Status: AC
  Administered 2017-07-09: 05:00:00
  Filled 2017-07-09: qty 56

## 2017-07-09 MED ORDER — ONDANSETRON HCL 4 MG PO TABS: 4.0000 mg | ORAL_TABLET | ORAL | Status: DC | PRN

## 2017-07-09 MED ORDER — DIBUCAINE 1 % RE OINT
1.0000 "application " | TOPICAL_OINTMENT | RECTAL | Status: DC | PRN
  Administered 2017-07-09: 1 via RECTAL
  Filled 2017-07-09: qty 28

## 2017-07-09 NOTE — Plan of Care (Signed)
Lochial Flow WNL. U/-1.

## 2017-07-09 NOTE — Anesthesia Postprocedure Evaluation (Signed)
Anesthesia Post Note  Patient: Jennifer Morse  Procedure(s) Performed: AN AD HOC LABOR EPIDURAL  Patient location during evaluation: Mother Baby Anesthesia Type: Epidural Level of consciousness: awake and alert and oriented Pain management: pain level controlled Vital Signs Assessment: post-procedure vital signs reviewed and stable Respiratory status: spontaneous breathing Cardiovascular status: blood pressure returned to baseline Postop Assessment: no headache Comments: Mild backache receding.  Patient ambulating well     Last Vitals:  Vitals:   07/09/17 1638 07/09/17 1938  BP: 111/71 102/68  Pulse: 83 69  Resp: 18 20  Temp: 36.6 C 36.4 C  SpO2:      Last Pain:  Vitals:   07/09/17 1938  TempSrc: Oral  PainSc:                  [name]

## 2017-07-09 NOTE — Discharge Summary (Signed)
Physician Obstetric Discharge Summary  Patient ID: Jennifer Morse MRN: [id number] DOB/AGE: 1991-01-04 26 y.o.   Date of Admission: 07/08/2017  Date of Discharge: 07/10/2018  Admitting Diagnosis: Onset of Labor at [redacted]w[redacted]d  Secondary Diagnosis: Gestational thrombocytopenia  Mode of Delivery: normal spontaneous vaginal delivery 07/08/2017      Discharge Diagnosis: Term intrauterine pregnancy delivered   Intrapartum Procedures: Atificial rupture of membranes, epidural and repair of second degree perineal and right labia lacerations   Post partum procedures: none  Complications: none   Brief Hospital Course  Jennifer Morse is a G2P2001 who had a SVD on 07/08/2017;  for further details of this delivery, please refer to the delivery note.  Patient had an uncomplicated postpartum course.  By time of discharge on PPD#2, her pain was controlled on oral pain medications; she had appropriate lochia and was ambulating, voiding without difficulty and tolerating regular diet.  She was deemed stable for discharge to home.    Labs: CBC Latest Ref Rng & Units 07/09/2017 07/08/2017 06/30/2017  WBC 3.6 - 11.0 K/uL 10.8 11.2(H) 6.6  Hemoglobin 12.0 - 16.0 g/dL 9.6(L) 11.4(L) 11.2  Hematocrit 35.0 - 47.0 % 29.1(L) 34.6(L) 33.6(L)  Platelets 150 - 440 K/uL 124(L) 136(L) 142(L)   O POS/ RI/ VI  Physical exam:  Blood pressure 105/62, pulse 73, temperature 98.2 F (36.8 C), temperature source Oral, resp. rate 18, height 5\' 8"  (1.727 m), weight 98 kg (216 lb), last menstrual period 09/21/2016, SpO2 99 %, unknown if currently breastfeeding. General: alert and no distress Lochia: appropriate Abdomen: soft, NT Uterine Fundus: firm/ U-2/ML/NT Perineum: healing well, no significant drainage, no dehiscence, no significant erythema Rectum: hemorrhoids with less swelling and inflammation Extremities: No evidence of DVT seen on physical exam. No lower extremity edema.  Discharge Instructions: Per After  Visit Summary. Activity: Advance as tolerated. Pelvic rest for 6 weeks.  Also refer to Discharge Instructions Diet: Regular Medications: Allergies as of 07/10/2017   No Known Allergies     Medication List    STOP taking these medications   acetaminophen 500 MG tablet Commonly known as:  TYLENOL   Doxylamine-Pyridoxine 10-10 MG Tbec Commonly known as:  DICLEGIS   ondansetron 4 MG disintegrating tablet Commonly known as:  ZOFRAN ODT     TAKE these medications   Butalbital-APAP-Caffeine 50-300-40 MG Caps   clobetasol cream 0.05 % Commonly known as:  TEMOVATE Apply 1 application topically 2 (two) times daily as needed.   docusate sodium 100 MG capsule Commonly known as:  COLACE Take 100 mg by mouth daily as needed for mild constipation.   hydrocortisone-pramoxine 2.5-1 % rectal cream Commonly known as:  ANALPRAM HC Place 1 application rectally 4 (four) times daily.   ibuprofen 600 MG tablet Commonly known as:  ADVIL,MOTRIN Take 1 tablet (600 mg total) every 6 (six) hours as needed by mouth for mild pain, moderate pain or cramping.   Lidocaine 2 % Gel Apply 1 application topically 2 (two) times daily.   MIRALAX PO   oxyCODONE 5 MG immediate release tablet Commonly known as:  Oxy IR/ROXICODONE Take 1 tablet (5 mg total) every 4 (four) hours as needed by mouth for moderate pain or severe pain.   prenatal vitamin w/FE, FA 27-1 MG Tabs tablet Take 1 tablet by mouth daily at 12 noon.   ranitidine 150 MG tablet Commonly known as:  ZANTAC Take 150 mg by mouth at bedtime.      Outpatient follow up:  Follow-up Information  Dalia Heading, CNM. Schedule an appointment as soon as possible for a visit in 6 week(s).   Specialty:  Certified Nurse Midwife Why:  for 6 week check up and Mirena insertion Contact information: [place] [address] [phone]          Postpartum contraception: Mirena IUD  Discharged Condition:  stable  Discharged to: home   Newborn Data: [name]/ 8#3oz Disposition:home with mother  Apgars: APGAR (1 MIN): 9   APGAR (5 MINS): 9   APGAR (10 MINS):    Baby Feeding: Breast  Dalia Heading, CNM 07/10/2017 9:29 AM

## 2017-07-09 NOTE — Plan of Care (Signed)
Pt. Alert and oriented. States pain is relieved by scheduled Ibuprofen and PRN Roxi. Cont's to have increased cramping in abd. With Breast Feeding.

## 2017-07-09 NOTE — Discharge Instructions (Signed)
°Vaginal Delivery, Care After °Refer to this sheet in the next few weeks. These discharge instructions provide you with information on caring for yourself after delivery. Your caregiver may also give you specific instructions. Your treatment has been planned according to the most current medical practices available, but problems sometimes occur. Call your caregiver if you have any problems or questions after you go home. °HOME CARE INSTRUCTIONS °1. Take over-the-counter or prescription medicines only as directed by your caregiver or pharmacist. °2. Do not drink alcohol, especially if you are breastfeeding or taking medicine to relieve pain. °3. Do not smoke tobacco. °4. Continue to use good perineal care. Good perineal care includes: °1. Wiping your perineum from back to front °2. Keeping your perineum clean. °3. You can do sitz baths twice a day, to help keep this area clean °5. Do not use tampons, douche or have sex for 6 weeks °6. Shower only and avoid sitting in submerged water, aside from sitz baths °7. Wear a well-fitting bra that provides breast support. °8. Eat healthy foods. °9. Drink enough fluids to keep your urine clear or pale yellow. °10. Eat high-fiber foods such as whole grain cereals and breads, brown rice, beans, and fresh fruits and vegetables every day. These foods may help prevent or relieve constipation. °11. Avoid constipation with high fiber foods or medications, such as miralax or metamucil °12. Follow your caregiver's recommendations regarding resumption of activities such as climbing stairs, driving, lifting, exercising, or traveling. °13. Talk to your caregiver about resuming sexual activities. Resumption of sexual activities after 6 weeks is dependent upon your risk of infection, your rate of healing, and your comfort and desire to resume sexual activity. °14. Try to have someone help you with your household activities and your newborn for at least a few days after you leave the  hospital. °15. Rest as much as possible. Try to rest or take a nap when your newborn is sleeping. °16. Increase your activities gradually. °17. Keep all of your scheduled postpartum appointments. It is very important to keep your scheduled follow-up appointments. At these appointments, your caregiver will be checking to make sure that you are healing physically and emotionally. °SEEK MEDICAL CARE IF:  °· You are passing large clots from your vagina. Save any clots to show your caregiver. °· You have a foul smelling discharge from your vagina. °· You have trouble urinating. °· You are urinating frequently. °· You have pain when you urinate. °· You have a change in your bowel movements. °· You have increasing redness, pain, or swelling near your vaginal incision (episiotomy) or vaginal tear. °· You have pus draining from your episiotomy or vaginal tear. °· Your episiotomy or vaginal tear is separating. °· You have painful, hard, or reddened breasts. °· You have a severe headache. °· You have blurred vision or see spots. °· You feel sad or depressed. °· You have thoughts of hurting yourself or your newborn. °· You have questions about your care, the care of your newborn, or medicines. °· You are dizzy or light-headed. °· You have a rash. °· You have nausea or vomiting. °· You were breastfeeding and have not had a menstrual period within 12 weeks after you stopped breastfeeding. °· You are not breastfeeding and have not had a menstrual period by the 12th week after delivery. °· You have a fever of 100.5 or more °SEEK IMMEDIATE MEDICAL CARE IF:  °· You have persistent pain. °· You have chest pain. °· You have shortness   of breath. °· You faint. °· You have leg pain. °· You have stomach pain. °· Your vaginal bleeding saturates two or more sanitary pads in 1 hour. °MAKE SURE YOU:  °· Understand these instructions. °· Will watch your condition. °· Will get help right away if you are not doing well or get worse. °Document  Released: 08/19/2000 Document Revised: 01/06/2014 Document Reviewed: 04/18/2012 °Exit[employer]® Patient Information ©2015 [employer]. This information is not intended to replace advice given to you by your health care provider. Make sure you discuss any questions you have with your health care provider. ° °Sitz Bath °A sitz bath is a warm water bath taken in the sitting position. The water covers only the hips and butt (buttocks). We recommend using one that fits in the toilet, to help with ease of use and cleanliness. It may be used for either healing or cleaning purposes. Sitz baths are also used to relieve pain, itching, or muscle tightening (spasms). The water may contain medicine. Moist heat will help you heal and relax.  °HOME CARE  °Take 3 to 4 sitz baths a day. °18. Fill the bathtub half-full with warm water. °19. Sit in the water and open the drain a little. °20. Turn on the warm water to keep the tub half-full. Keep the water running constantly. °21. Soak in the water for 15 to 20 minutes. °22. After the sitz bath, pat the affected area dry. °GET HELP RIGHT AWAY IF: °You get worse instead of better. Stop the sitz baths if you get worse. °MAKE SURE YOU: °· Understand these instructions. °· Will watch your condition. °· Will get help right away if you are not doing well or get worse. °Document Released: 09/29/2004 Document Revised: 05/16/2012 Document Reviewed: 12/20/2010 °ExitCare® Patient Information ©2015 [employer]. This information is not intended to replace advice given to you by your health care provider. Make sure you discuss any questions you have with your health care provider. ° ° °

## 2017-07-09 NOTE — Progress Notes (Signed)
Post Partum Day 1 Subjective: Cramping and some back pain. Voiding without difficulty. Bleeding slowing. Applying topicals to perineum and hemorrhoids with some relief.. Tolerating a regular diet.  Objective: Blood pressure 110/74, pulse 79, temperature 98 F (36.7 C), temperature source Oral, resp. rate 18, height 5\' 8"  (1.727 m), weight 216 lb (98 kg), last menstrual period 09/21/2016, SpO2 99 %, unknown if currently breastfeeding.  Physical Exam:  General: alert, cooperative and no distress Lochia: appropriate Uterine Fundus: firm/ U-2/ ML/ NT Perineum: healing well. Decreased swelling of hemorrhoids DVT Evaluation: No evidence of DVT seen on physical exam.  Recent Labs    07/08/17 1508 07/09/17 0524  HGB 11.4* 9.6*  HCT 34.6* 29.1*  WBC 11.2* 10.8  PLT 136* 124*    Assessment/Plan: stable PPD #1 Mild anemia  Vitamins with iron Hemorrhoidal and perineal discomfort  Topicals/ ice/ start sitz baths tomorrow Plan for discharge tomorrow  O POS/ RI/ VI Breast TDAP UTD   LOS: 1 day   Dalia Heading 07/09/2017, 12:13 PM

## 2017-07-10 ENCOUNTER — Telehealth: Payer: Self-pay | Admitting: Certified Nurse Midwife

## 2017-07-10 MED ORDER — OXYCODONE HCL 5 MG PO TABS: 5.0000 mg | ORAL_TABLET | ORAL | 0 refills | Status: DC | PRN

## 2017-07-10 MED ORDER — IBUPROFEN 600 MG PO TABS: 600.0000 mg | ORAL_TABLET | Freq: Four times a day (QID) | ORAL | 1 refills | Status: DC | PRN

## 2017-07-10 NOTE — Progress Notes (Signed)
Pt discharged with infant.  Discharge instructions, prescriptions and follow up appointment given to and reviewed with pt. Pt verbalized understanding. Escorted out by auxillary. 

## 2017-07-10 NOTE — Telephone Encounter (Signed)
Pt is schedule 08/17/17 with [facility] for mirena insertion

## 2017-07-12 ENCOUNTER — Encounter: Payer: 59 | Admitting: Certified Nurse Midwife

## 2017-07-20 NOTE — Telephone Encounter (Signed)
Noted. Will order to arrive by apt date/time. 

## 2017-08-15 NOTE — Telephone Encounter (Signed)
Pt has been reschedule due to [place] out of office patient is schedule to 08/25/17 at 1:30

## 2017-08-17 ENCOUNTER — Ambulatory Visit: Payer: 59 | Admitting: Certified Nurse Midwife

## 2017-08-25 ENCOUNTER — Encounter: Payer: Self-pay | Admitting: Certified Nurse Midwife

## 2017-08-25 ENCOUNTER — Ambulatory Visit (INDEPENDENT_AMBULATORY_CARE_PROVIDER_SITE_OTHER): Payer: 59 | Admitting: Certified Nurse Midwife

## 2017-08-25 DIAGNOSIS — Z30430 Encounter for insertion of intrauterine contraceptive device: Secondary | ICD-10-CM | POA: Diagnosis not present

## 2017-08-25 DIAGNOSIS — E041 Nontoxic single thyroid nodule: Secondary | ICD-10-CM

## 2017-08-25 DIAGNOSIS — K648 Other hemorrhoids: Secondary | ICD-10-CM

## 2017-08-25 DIAGNOSIS — K649 Unspecified hemorrhoids: Secondary | ICD-10-CM

## 2017-08-25 MED ORDER — LIDOCAINE 2 % EX GEL: 1.0000 "application " | Freq: Two times a day (BID) | CUTANEOUS | 1 refills | Status: DC

## 2017-08-25 NOTE — Progress Notes (Signed)
Postpartum Visit  Chief Complaint:  Chief Complaint  Patient presents with  . Postpartum Care    6 wk pp visit    History of Present Illness: Jennifer Morse is a 26 y.o.  WF G2P2002 who presents for a 6 week postpartum visit and a Mirena insertion  Date of delivery: 07/08/2017 Type of delivery: Vaginal delivery - Vacuum or forceps assisted  no Episiotomy No.  Laceration: yes, second degree perineal lac Pregnancy or labor problems:  Yes, migraine headaches, gestational thrombocytopenia, and hemorrhoidal pain Any problems since the delivery:  Yes, hemorrhoidal pain and bleeding-using anal pram, colace, Lidocaine (ran out) and Tucks pads  Newborn Details:  SINGLETON :  1. Baby's name: [name]. Birth weight: 8#3oz Maternal Details:  Breast Feeding:  yes Post partum depression/anxiety noted:  no Edinburgh Post-Partum Depression Score:  5  Date of last PAP: 09/01/2016  normal ?  Review of Systems: Review of Systems  Constitutional: Positive for weight loss (10# since delivery). Negative for chills and fever.  HENT: Negative for congestion, sinus pain and sore throat.   Eyes: Negative for blurred vision and pain.  Respiratory: Negative for hemoptysis, shortness of breath and wheezing.   Cardiovascular: Negative for chest pain, palpitations and leg swelling.  Gastrointestinal: Positive for blood in stool (rectal bleeding after BM). Negative for abdominal pain, diarrhea, heartburn, nausea and vomiting.       Positive for hemorrhoids  Genitourinary: Negative for dysuria, frequency, hematuria and urgency.  Musculoskeletal: Negative for back pain, joint pain and myalgias.  Skin: Negative for itching and rash.  Neurological: Negative for dizziness, tingling and headaches.  Endo/Heme/Allergies: Negative for environmental allergies and polydipsia. Does not bruise/bleed easily.       Negative for hirsutism   Psychiatric/Behavioral: Negative for depression. The patient is not  nervous/anxious and does not have insomnia.     Past Medical History:  Past Medical History:  Diagnosis Date  . Frequent headaches   . Gestational thrombocytopenia ([facility])   . Hemorrhoids during pregnancy, antepartum   . Migraine with aura, intractable, with status migrainosus 1995  . Oligomenorrhea   . Postpartum depression    with first pregnancy    Past Surgical History:  Past Surgical History:  Procedure Laterality Date  . WISDOM TOOTH EXTRACTION  2012    Family History:  Family History  Problem Relation Age of Onset  . Diabetes Mother   . Diabetes Sister   . Diabetes Maternal Grandmother   . Heart disease Maternal Grandmother   . Hypertension Maternal Grandmother   . Kidney cancer Maternal Grandmother   . Diabetes Maternal Grandfather   . Heart disease Maternal Grandfather   . Hypertension Maternal Grandfather   . Diabetes Maternal Aunt     Social History:  Social History   Socioeconomic History  . Marital status: Married    Spouse name: Edison Nasuti  . Number of children: 2  . Years of education: Not on file  . Highest education level: Not on file  Social Needs  . Financial resource strain: Not on file  . Food insecurity - worry: Not on file  . Food insecurity - inability: Not on file  . Transportation needs - medical: Not on file  . Transportation needs - non-medical: Not on file  Occupational History  . Occupation: Therapist, sports  Tobacco Use  . Smoking status: Former Smoker    Packs/day: 0.25    Types: Cigarettes    Last attempt to quit: 02/26/2015    Years  since quitting: 2.4  . Smokeless tobacco: Former Systems developer    Quit date: 2016  Substance and Sexual Activity  . Alcohol use: No    Alcohol/week: 0.6 oz    Types: 1 Glasses of wine per week  . Drug use: No  . Sexual activity: Yes    Partners: Male    Birth control/protection: None  Other Topics Concern  . Not on file  Social History Narrative  . Not on file   OB History  Gravida Para Term Preterm AB Living    2 2 2  0 0 1  SAB TAB Ectopic Multiple Live Births  0 0 0 0 1    # Outcome Date GA Lbr Len/2nd Weight Sex Delivery Anes PTL Lv  2 Term 07/08/17 [redacted]w[redacted]d 15:15 / 00:28 3.71 kg (8 lb 2.9 oz) M Vag-Spont EPI N LIV     Birth Comments: Dimpled cheek (right)  1 Term 07/01/15 [redacted]w[redacted]d 218:30 / 02:26 3.71 kg (8 lb 2.9 oz) F Vag-Spont EPI  LIV    Obstetric Comments  1st Menstrual Cycle:  15   1st Pregnancy:  24   Allergies:  No Known Allergies  Medications:  Current Outpatient Medications on File Prior to Visit  Medication Sig Dispense Refill  . Butalbital-APAP-Caffeine 50-300-40 MG CAPS   1  . clobetasol cream (TEMOVATE) 9.37 % Apply 1 application topically 2 (two) times daily as needed. 30 g 0  . docusate sodium (COLACE) 100 MG capsule Take 100 mg by mouth daily as needed for mild constipation.    . hydrocortisone-pramoxine (ANALPRAM HC) 2.5-1 % rectal cream Place 1 application rectally 4 (four) times daily. 30 g 1  . ibuprofen (ADVIL,MOTRIN) 600 MG tablet Take 1 tablet (600 mg total) every 6 (six) hours as needed by mouth for mild pain, moderate pain or cramping. 30 tablet 1  . oxyCODONE (OXY IR/ROXICODONE) 5 MG immediate release tablet Take 1 tablet (5 mg total) every 4 (four) hours as needed by mouth for moderate pain or severe pain. 30 tablet 0  . Polyethylene Glycol 3350 (MIRALAX PO)     . prenatal vitamin w/FE, FA (PRENATAL 1 + 1) 27-1 MG TABS tablet Take 1 tablet by mouth daily at 12 noon.    . ranitidine (ZANTAC) 150 MG tablet Take 150 mg by mouth at bedtime.     No current facility-administered medications on file prior to visit.    Physical Exam Vitals:  BP 102/62   Pulse 62   Ht 5\' 8"  (1.727 m)   Wt 206 lb (93.4 kg)   Breastfeeding? Yes   BMI 31.32 kg/m   General: WF in NAD, presents with [name] HEENT: normocephalic, anicteric Neck: No thyroid enlargement, questionable nodule on right, no cervical lymphadenpathy Breast: Lactating, no inflammation, no masses, nipples  intact Pulmonary: No increased work of breathing, CTAB Abdomen: Soft, non-tender, non-distended.  Umbilicus without lesions.  No hepatomegaly or masses palpable. No evidence of hernia. Genitourinary:  External: Well healed perineum, no lesions or inflammation    Vagina: Normal vaginal mucosa, no evidence of prolapse.    Cervix: Grossly normal in appearance, no bleeding  Uterus: Well involuted, mobile, non-tender, Av  Adnexa: No adnexal masses, non-tender  Rectal: no external hemorrhoids Extremities: no edema, erythema, or tenderness Neurologic: Grossly intact Psychiatric: mood appropriate, affect full  Assessment: 26 y.o. G2P2001 presenting for 6 week postpartum visit Possible thyroid nodule Hemorrhoidal discomfort and bleeding  Plan:   1) Contraception: Patient would like to use Mirena IUD  for  contraception. Explained how the IUD works, how it is inserted, effectiveness, and risks of expulsion, perforation, infection and irregular bleeding. Patient wishes to proceed with insertion. See procedure note.  2)  Pap not due yet-will do when she returns in one month for IUD check.  3) Patient underwent screening for postpartum depression with no concerns noted.  4) Discussed return to normal activity, recommend continuing prenatal vitamins.  5) Will schedule for a thyroid ultrasound  6) Refill Lidocaine 2% and continue Analpram. GI referral regarding hemorrhoids  Dalia Heading, CNM     GYNECOLOGY OFFICE PROCEDURE NOTE  CEAIRA ERNSTER is a 26 y.o. G2P2001 here for Mirena IUD insertion. No GYN concerns.  Last pap smear was on 09/01/2017 and was normal.  IUD Insertion Procedure Note Patient identified, informed consent performed, consent signed.   Discussed risks of irregular bleeding, cramping, infection, expulsion,malpositioning or misplacement of the IUD outside the uterus which may require further procedure such as laparoscopy. Time out was performed. Currently breast  feeding and amenorrheic  On bimanual exam, uterus was Anteverted Speculum placed in the vagina.  Cervix visualized.  Cleaned with Betadine x 3. Cervix was sprayed with Hurricaine anesthetic and  grasped anteriorly with a single tooth tenaculum.  Uterus sounded to 8 cm.  Mirena  IUD placed per manufacturer's recommendations.  Strings trimmed to 3 cm. Tenaculum was removed, and silver nitrate was applied to tenaculum sites for hemostasis.  Patient tolerated procedure well.   Patient was given post-procedure instructions. She understands tht she will need to remove or replace IUD in 5 years.  Patient was also asked to check IUD strings periodically and follow up in 4 weeks for IUD check. Will do Pap at that time.  Dalia Heading, CNM 08/25/17

## 2017-09-04 ENCOUNTER — Ambulatory Visit
Admission: RE | Admit: 2017-09-04 | Discharge: 2017-09-04 | Disposition: A | Discharge status: Home / Self Care | Payer: 59 | Source: Ambulatory Visit | Attending: Certified Nurse Midwife | Admitting: Certified Nurse Midwife

## 2017-09-04 DIAGNOSIS — E041 Nontoxic single thyroid nodule: Secondary | ICD-10-CM | POA: Diagnosis not present

## 2017-09-04 DIAGNOSIS — E042 Nontoxic multinodular goiter: Secondary | ICD-10-CM | POA: Diagnosis not present

## 2017-09-07 ENCOUNTER — Telehealth: Payer: Self-pay | Admitting: Certified Nurse Midwife

## 2017-09-07 DIAGNOSIS — E041 Nontoxic single thyroid nodule: Secondary | ICD-10-CM

## 2017-09-07 NOTE — Telephone Encounter (Signed)
Thyroid ultrasound confirmed a solid mass on thyroid isthmus measuring 1x 0.7 x 0.5cm.and a subcentimeter cystic mass just left of midline. Will refer to endocrinology for further evaluation of thyroid masses.

## 2017-09-08 DIAGNOSIS — E039 Hypothyroidism, unspecified: Secondary | ICD-10-CM | POA: Insufficient documentation

## 2017-09-18 DIAGNOSIS — E042 Nontoxic multinodular goiter: Secondary | ICD-10-CM | POA: Diagnosis not present

## 2017-09-18 DIAGNOSIS — E041 Nontoxic single thyroid nodule: Secondary | ICD-10-CM | POA: Diagnosis not present

## 2017-09-20 DIAGNOSIS — Z3482 Encounter for supervision of other normal pregnancy, second trimester: Secondary | ICD-10-CM | POA: Diagnosis not present

## 2017-09-21 ENCOUNTER — Ambulatory Visit (INDEPENDENT_AMBULATORY_CARE_PROVIDER_SITE_OTHER): Payer: 59 | Admitting: Gastroenterology

## 2017-09-21 ENCOUNTER — Encounter: Payer: Self-pay | Admitting: Gastroenterology

## 2017-09-21 ENCOUNTER — Encounter: Payer: Self-pay | Admitting: Certified Nurse Midwife

## 2017-09-21 ENCOUNTER — Other Ambulatory Visit: Payer: Self-pay

## 2017-09-21 ENCOUNTER — Ambulatory Visit (INDEPENDENT_AMBULATORY_CARE_PROVIDER_SITE_OTHER): Payer: 59 | Admitting: Certified Nurse Midwife

## 2017-09-21 VITALS — BP 102/62 | HR 61 | Ht 68.0 in | Wt 210.0 lb

## 2017-09-21 VITALS — BP 103/70 | HR 74 | Temp 97.5°F | Ht 68.0 in | Wt 211.6 lb

## 2017-09-21 DIAGNOSIS — Z30431 Encounter for routine checking of intrauterine contraceptive device: Secondary | ICD-10-CM | POA: Diagnosis not present

## 2017-09-21 DIAGNOSIS — Z862 Personal history of diseases of the blood and blood-forming organs and certain disorders involving the immune mechanism: Secondary | ICD-10-CM | POA: Diagnosis not present

## 2017-09-21 DIAGNOSIS — K642 Third degree hemorrhoids: Secondary | ICD-10-CM | POA: Diagnosis not present

## 2017-09-21 DIAGNOSIS — K601 Chronic anal fissure: Secondary | ICD-10-CM

## 2017-09-21 NOTE — Progress Notes (Signed)
Jennifer Darby, MD [address]  [place]  [place], [place] [address]  Main: [phone]  Fax: [phone]    Gastroenterology Consultation  Referring Provider:     Dalia Morse, CNM Primary Care Physician:  Jennifer Morse, CNM Primary Gastroenterologist:  Dr. Cephas Morse Reason for Consultation:     Symptomatic hemorrhoids, anal fissure        HPI:   Jennifer Morse is a 27 y.o. female referred by Dr. Dalia Morse, CNM  for consultation & management of symptomatic hemorrhoids. She has been dealing with severe symptomatic hemorrhoids ever since her first child was born. Symptoms include itching, blood on wiping and sometimes on surface of stool, severe sharp, taking type of pain during and after bowel movement, straining, constipation, sensation of incomplete emptying. She is taking MiraLAX and Colace, fiber supplements that keeps her bowel movements soft. She was using over-the-counter various hemorrhoid treatment medications with partial benefit. With her second pregnancy, symptoms got worse. She has been seeing Dr. Bary Castilla for anal fissure, followed by thrombosed external hemorrhoid. She was receiving prescriptions for hydrocortisone cream and topical lidocaine. She has been doing this for almost one year. She delivered her last child on 07/08/2017. She denies diarrhea, abdominal pain. She was anemic during her pregnancy, and I do not have follow-up labs since delivery.  She is a labor and delivery nurse at [employer]  NSAIDs: None  Antiplts/Anticoagulants/Anti thrombotics: None  GI Procedures: None She denies family history of GI malignancy, IBD  Past Medical History:  Diagnosis Date  . Frequent headaches   . Gestational thrombocytopenia ([facility])   . Hemorrhoids during pregnancy, antepartum   . Migraine with aura, intractable, with status migrainosus 1995  . Oligomenorrhea   . Postpartum depression    with first pregnancy    Past Surgical History:    Procedure Laterality Date  . WISDOM TOOTH EXTRACTION  2012     Current Outpatient Medications:  .  Butalbital-APAP-Caffeine 50-300-40 MG CAPS, , Disp: , Rfl: 1 .  clobetasol cream (TEMOVATE) 9.48 %, Apply 1 application topically 2 (two) times daily as needed., Disp: 30 g, Rfl: 0 .  docusate sodium (COLACE) 100 MG capsule, Take 100 mg by mouth daily as needed for mild constipation., Disp: , Rfl:  .  hydrocortisone-pramoxine (ANALPRAM HC) 2.5-1 % rectal cream, Place 1 application rectally 4 (four) times daily., Disp: 30 g, Rfl: 1 .  ibuprofen (ADVIL,MOTRIN) 600 MG tablet, Take 1 tablet (600 mg total) every 6 (six) hours as needed by mouth for mild pain, moderate pain or cramping., Disp: 30 tablet, Rfl: 1 .  Lidocaine 2 % GEL, Apply 1 application topically 2 (two) times daily., Disp: 30 g, Rfl: 1 .  Polyethylene Glycol 3350 (MIRALAX PO), , Disp: , Rfl:  .  ranitidine (ZANTAC) 150 MG tablet, Take 150 mg by mouth at bedtime., Disp: , Rfl:    Family History  Problem Relation Age of Onset  . Diabetes Mother   . Diabetes Sister   . Diabetes Maternal Grandmother   . Heart disease Maternal Grandmother   . Hypertension Maternal Grandmother   . Kidney cancer Maternal Grandmother   . Diabetes Maternal Grandfather   . Heart disease Maternal Grandfather   . Hypertension Maternal Grandfather   . Diabetes Maternal Aunt      Social History   Tobacco Use  . Smoking status: Former Smoker    Packs/day: 0.25    Types: Cigarettes    Last attempt to quit: 02/26/2015  Years since quitting: 2.5  . Smokeless tobacco: Former Systems developer    Quit date: 2016  Substance Use Topics  . Alcohol use: No    Alcohol/week: 0.6 oz    Types: 1 Glasses of wine per week  . Drug use: No    Allergies as of 09/21/2017  . (No Known Allergies)    Review of Systems:    All systems reviewed and negative except where noted in HPI.   Physical Exam:  BP 103/70   Pulse 74   Temp (!) 97.5 F (36.4 C) (Oral)   Ht 5'  8" (1.727 m)   Wt 211 lb 9.6 oz (96 kg)   BMI 32.17 kg/m  No LMP recorded.  General:   Alert,  Well-developed, well-nourished, pleasant and cooperative in NAD Head:  Normocephalic and atraumatic. Eyes:  Sclera clear, no icterus.   Conjunctiva pink. Ears:  Normal auditory acuity. Nose:  No deformity, discharge, or lesions. Mouth:  No deformity or lesions,oropharynx pink & moist. Neck:  Supple; no masses or thyromegaly. Lungs:  Respirations even and unlabored.  Clear throughout to auscultation.   No wheezes, crackles, or rhonchi. No acute distress. Heart:  Regular rate and rhythm; no murmurs, clicks, rubs, or gallops. Abdomen:  Normal bowel sounds. Soft, non-tender and non-distended without masses, hepatosplenomegaly or hernias noted.  No guarding or rebound tenderness.   Rectal: Digital rectal exam revealed tenderness in posterior wall of the anal canal, consistent with fissure. Anal spasm. No perianal fistula or rash. Small external hemorrhoids present. Anoscopy was performed which revealed grade 2 internal hemorrhoids and brownish mucous stool in the rectal vault. There was no rectal inflammation on limited exam. Msk:  Symmetrical without gross deformities. Good, equal movement & strength bilaterally. Pulses:  Normal pulses noted. Extremities:  No clubbing or edema.  No cyanosis. Neurologic:  Alert and oriented x3;  grossly normal neurologically. Skin:  Intact without significant lesions or rashes. No jaundice. Lymph Nodes:  No significant cervical adenopathy. Psych:  Alert and cooperative. Normal mood and affect.  Imaging Studies: No recent abdominal imaging  Assessment and Plan:   Jennifer Morse is a 27 y.o. 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 with her degree of symptoms I will start 0.125 nitroglycerin and topical lidocaine to apply  rectally 2 times daily for 2-3 months Handouts on nitroglycerin application given Avoid constipation with current bowel regimen as she is doing  Symptomatic hemorrhoids: Discussed with her about outpatient hemorrhoid ligation with [facility] banding kit I would like to see her anal fissure to heal more, given at least 4 weeks time prior to performing ligation She will continue hydrocortisone cream twice daily Fiber diet, continue sitz baths She is going to [place] today  Anemia during pregnancy: Repeat CBC, ferritin, B12 and folate levels today  Follow up in 4 weeks   Jennifer Darby, MD

## 2017-09-21 NOTE — Progress Notes (Signed)
  History of Present Illness:  Jennifer Morse is a 27 y.o. that had a Mirena IUD placed approximately 4 weeks ago. Since that time, she states that she has had some mucoid blood tinged discharge. Denies abdominal pain or fever. She has also been seen by Dr Carmela Rima for evaluation of a thyroid nodule that was found on her postpartum exam. She will be getting a thyroid scan and having a FNA of the solid thyroid nodule. She has also been seen by Dr Marius Ditch who is treating her for hemorrhoids and an anal fissure.   PMHx: She  has a past medical history of Frequent headaches, Gestational thrombocytopenia ([facility]), Hemorrhoids during pregnancy, antepartum, Migraine with aura, intractable, with status migrainosus (1995), Oligomenorrhea, and Postpartum depression. Also,  has a past surgical history that includes Wisdom tooth extraction (2012)., family history includes Diabetes in her maternal aunt, maternal grandfather, maternal grandmother, mother, and sister; Heart disease in her maternal grandfather and maternal grandmother; Hypertension in her maternal grandfather and maternal grandmother; Kidney cancer in her maternal grandmother.,  reports that she quit smoking about 2 years ago. Her smoking use included cigarettes. She smoked 0.25 packs per day. She quit smokeless tobacco use about 3 years ago. She reports that she does not drink alcohol or use drugs.  She has a current medication list which includes the following prescription(s): butalbital-apap-caffeine, clobetasol cream, docusate sodium, hydrocortisone-pramoxine, ibuprofen, lidocaine, polyethylene glycol 3350, and ranitidine. Also, has No Known Allergies.  ROS  Physical Exam:  Vital signs: BP 102/62   Pulse 61   Ht 5\' 8"  (1.727 m)   Wt 210 lb (95.3 kg)   BMI 31.93 kg/m  Constitutional: Well nourished, well developed female in no acute distress.  Neuro: Grossly intact Psych:  Normal mood and affect.    Pelvic exam: External/BUS: no lesion, no  discharge Vagina: no lesions, scant blood Cervix: Two IUD strings present at the cervical os.   Assessment: IUD strings present in proper location; pt doing well  Plan: Will see her back in 1 year/PRN.  Dalia Heading, CNM

## 2017-09-26 DIAGNOSIS — E042 Nontoxic multinodular goiter: Secondary | ICD-10-CM | POA: Diagnosis not present

## 2017-09-27 DIAGNOSIS — K648 Other hemorrhoids: Secondary | ICD-10-CM | POA: Diagnosis not present

## 2017-09-27 DIAGNOSIS — E042 Nontoxic multinodular goiter: Secondary | ICD-10-CM | POA: Diagnosis not present

## 2017-09-27 DIAGNOSIS — G43D0 Abdominal migraine, not intractable: Secondary | ICD-10-CM | POA: Diagnosis not present

## 2017-09-28 DIAGNOSIS — E042 Nontoxic multinodular goiter: Secondary | ICD-10-CM | POA: Diagnosis not present

## 2017-10-06 ENCOUNTER — Ambulatory Visit (INDEPENDENT_AMBULATORY_CARE_PROVIDER_SITE_OTHER): Payer: 59 | Admitting: Gastroenterology

## 2017-10-06 ENCOUNTER — Encounter: Payer: Self-pay | Admitting: Gastroenterology

## 2017-10-06 ENCOUNTER — Other Ambulatory Visit
Admission: RE | Admit: 2017-10-06 | Discharge: 2017-10-06 | Disposition: A | Discharge status: Home / Self Care | Payer: 59 | Source: Ambulatory Visit | Attending: Gastroenterology | Admitting: Gastroenterology

## 2017-10-06 VITALS — BP 96/62 | HR 65 | Temp 97.8°F | Ht 68.0 in | Wt 214.0 lb

## 2017-10-06 DIAGNOSIS — K5904 Chronic idiopathic constipation: Secondary | ICD-10-CM

## 2017-10-06 DIAGNOSIS — K601 Chronic anal fissure: Secondary | ICD-10-CM | POA: Diagnosis not present

## 2017-10-06 DIAGNOSIS — Z862 Personal history of diseases of the blood and blood-forming organs and certain disorders involving the immune mechanism: Secondary | ICD-10-CM | POA: Insufficient documentation

## 2017-10-06 DIAGNOSIS — G43D0 Abdominal migraine, not intractable: Secondary | ICD-10-CM | POA: Diagnosis not present

## 2017-10-06 DIAGNOSIS — E042 Nontoxic multinodular goiter: Secondary | ICD-10-CM | POA: Diagnosis not present

## 2017-10-06 DIAGNOSIS — K648 Other hemorrhoids: Secondary | ICD-10-CM | POA: Diagnosis not present

## 2017-10-06 LAB — CBC
HCT: 38.5 % (ref 35.0–47.0)
Hemoglobin: 12.7 g/dL (ref 12.0–16.0)
MCH: 27.9 pg (ref 26.0–34.0)
MCHC: 32.9 g/dL (ref 32.0–36.0)
MCV: 84.7 fL (ref 80.0–100.0)
PLATELETS: 171 10*3/uL (ref 150–440)
RBC: 4.55 MIL/uL (ref 3.80–5.20)
RDW: 14.6 % — ABNORMAL HIGH (ref 11.5–14.5)
WBC: 4.9 10*3/uL (ref 3.6–11.0)

## 2017-10-06 LAB — FOLATE: FOLATE: 12.5 ng/mL (ref >5.9)

## 2017-10-06 LAB — COMPREHENSIVE METABOLIC PANEL
ALK PHOS: 70 U/L (ref 38–126)
ALT: 42 U/L (ref 14–54)
AST: 28 U/L (ref 15–41)
Albumin: 4.4 g/dL (ref 3.5–5.0)
Anion gap: 8 (ref 5–15)
BUN: 16 mg/dL (ref 6–20)
CALCIUM: 9.3 mg/dL (ref 8.9–10.3)
CO2: 26 mmol/L (ref 22–32)
CREATININE: 0.8 mg/dL (ref 0.44–1.00)
Chloride: 105 mmol/L (ref 101–111)
Glucose, Bld: 89 mg/dL (ref 65–99)
Potassium: 3.8 mmol/L (ref 3.5–5.1)
Sodium: 139 mmol/L (ref 135–145)
Total Bilirubin: 0.3 mg/dL (ref 0.3–1.2)
Total Protein: 7.4 g/dL (ref 6.5–8.1)

## 2017-10-06 LAB — IRON AND TIBC
IRON: 55 ug/dL (ref 28–170)
SATURATION RATIOS: 14 % (ref 10.4–31.8)
TIBC: 396 ug/dL (ref 250–450)
UIBC: 341 ug/dL

## 2017-10-06 LAB — FERRITIN: Ferritin: 22 ng/mL (ref 11–307)

## 2017-10-06 LAB — VITAMIN B12: Vitamin B-12: 434 pg/mL (ref 180–914)

## 2017-10-06 NOTE — Patient Instructions (Addendum)
1. Start linzess 174mcg daily 28min before breakfast 2. Continue fiber in diet and benefiber 3. Continue nitro topical for fissure 4. Labs today 5. F/u in 4weeks  Please call our office to speak with my nurse Orvilla Fus at [phone] during business hours from 8am to 5pm if you have any questions/concerns. During after hours, you will be redirected to on call GI physician. For any emergency please call 911 or go the nearest emergency room.    Cephas Darby, MD [address]  [place]  [place],  [address]  Main: [phone]  Fax: [phone]

## 2017-10-06 NOTE — Progress Notes (Signed)
Jennifer Darby, MD [address]  [place]  [place], [place] [address]  Main: [phone]  Fax: [phone]    Gastroenterology Consultation  Referring Provider:     Dalia Heading, CNM Primary Care Physician:  Dalia Heading, CNM Primary Gastroenterologist:  Dr. Cephas Morse Reason for Consultation:     Symptomatic hemorrhoids, anal fissure        HPI:   Jennifer Morse is a 27 y.o. female referred by Dr. Dalia Heading, CNM  for consultation & management of symptomatic hemorrhoids. She has been dealing with severe symptomatic hemorrhoids ever since her first child was born. Symptoms include itching, blood on wiping and sometimes on surface of stool, severe sharp, tearing type of pain during and after bowel movement, straining, constipation, sensation of incomplete emptying. She is taking MiraLAX and Colace, fiber supplements that keeps her bowel movements soft. She was using over-the-counter various hemorrhoid treatment medications with partial benefit. With her second pregnancy, symptoms got worse. She has been seeing Dr. Bary Castilla for anal fissure, followed by thrombosed external hemorrhoid. She was receiving prescriptions for hydrocortisone cream and topical lidocaine. She has been doing this for almost one year. She delivered her last child on 07/08/2017. She denies diarrhea, abdominal pain. She was anemic during her pregnancy, and I do not have follow-up labs since delivery.  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 She is applying nitroglycerin ointment and also hydrocortisone cream which is providing significant relief. She reports that she is able to sit down without any pain after bowel movement which she was not able to do it previously. Also, reports that her bowel movements are closer to getting regular but still clumpy and hard. She is doing MiraLAX and fiber as  needed.  NSAIDs: None  Antiplts/Anticoagulants/Anti thrombotics: None  GI Procedures: None She denies family history of GI malignancy, IBD  Past Medical History:  Diagnosis Date  . Frequent headaches   . Gestational thrombocytopenia ([place])   . Hemorrhoids during pregnancy, antepartum   . Migraine with aura, intractable, with status migrainosus 1995  . Oligomenorrhea   . Postpartum depression    with first pregnancy    Past Surgical History:  Procedure Laterality Date  . WISDOM TOOTH EXTRACTION  2012     Current Outpatient Medications:  .  Butalbital-APAP-Caffeine 50-300-40 MG CAPS, , Disp: , Rfl: 1 .  docusate sodium (COLACE) 100 MG capsule, Take 100 mg by mouth daily as needed for mild constipation., Disp: , Rfl:  .  hydrocortisone-pramoxine (ANALPRAM HC) 2.5-1 % rectal cream, Place 1 application rectally 4 (four) times daily., Disp: 30 g, Rfl: 1 .  ibuprofen (ADVIL,MOTRIN) 600 MG tablet, Take 1 tablet (600 mg total) every 6 (six) hours as needed by mouth for mild pain, moderate pain or cramping., Disp: 30 tablet, Rfl: 1 .  Lidocaine 2 % GEL, Apply 1 application topically 2 (two) times daily., Disp: 30 g, Rfl: 1 .  Polyethylene Glycol 3350 (MIRALAX PO), , Disp: , Rfl:  .  ranitidine (ZANTAC) 150 MG tablet, Take 150 mg by mouth at bedtime., Disp: , Rfl:    Family History  Problem Relation Age of Onset  . Diabetes Mother   . Diabetes Sister   . Diabetes Maternal Grandmother   . Heart disease Maternal Grandmother   . Hypertension Maternal Grandmother   . Kidney cancer Maternal Grandmother   . Diabetes Maternal Grandfather   . Heart disease Maternal  Grandfather   . Hypertension Maternal Grandfather   . Diabetes Maternal Aunt      Social History   Tobacco Use  . Smoking status: Former Smoker    Packs/day: 0.25    Types: Cigarettes    Last attempt to quit: 02/26/2015    Years since quitting: 2.6  . Smokeless tobacco: Former Systems developer    Quit date: 2016  Substance Use  Topics  . Alcohol use: No    Alcohol/week: 0.6 oz    Types: 1 Glasses of wine per week  . Drug use: No    Allergies as of 10/06/2017  . (No Known Allergies)    Review of Systems:    All systems reviewed and negative except where noted in HPI.   Physical Exam:  BP 96/62   Pulse 65   Temp 97.8 F (36.6 C) (Oral)   Ht _0  (1.727 m)   Wt 214 lb (97.1 kg)   BMI 32.54 kg/m  No LMP recorded.  General:   Alert,  Well-developed, well-nourished, pleasant and cooperative in NAD Head:  Normocephalic and atraumatic. Eyes:  Sclera clear, no icterus.   Conjunctiva pink. Ears:  Normal auditory acuity. Nose:  No deformity, discharge, or lesions. Mouth:  No deformity or lesions,oropharynx pink & moist. Neck:  Supple; no masses or thyromegaly. Lungs:  Respirations even and unlabored.  Clear throughout to auscultation.   No wheezes, crackles, or rhonchi. No acute distress. Heart:  Regular rate and rhythm; no murmurs, clicks, rubs, or gallops. Abdomen:  Normal bowel sounds. Soft, non-tender and non-distended without masses, hepatosplenomegaly or hernias noted.  No guarding or rebound tenderness.   Rectal: Digital rectal exam revealed tenderness in posterior wall of the anal canal, consistent with fissure. Anal spasm. No perianal fistula or rash. Small external hemorrhoids present. Anoscopy was performed which revealed grade 2 internal hemorrhoids and brownish mucous stool in the rectal vault. There was no rectal inflammation on limited exam. Msk:  Symmetrical without gross deformities. Good, equal movement & strength bilaterally. Pulses:  Normal pulses noted. Extremities:  No clubbing or edema.  No cyanosis. Neurologic:  Alert and oriented x3;  grossly normal neurologically. Skin:  Intact without significant lesions or rashes. No jaundice. Lymph Nodes:  No significant cervical adenopathy. Psych:  Alert and cooperative. Normal mood and affect.  Imaging Studies: No recent abdominal  imaging  Assessment and Plan:   [name] is a 27 y.o. female with history of severe symptomatic external and internal hemorrhoids, anal fissure here for follow-up  Anal fissure: Continue topical 0.125% nitroglycerin and topical lidocaine to apply rectally 2-3 times daily for 2-3 months  Constipation: Trial of linaclotide 145 MCG Continue fiber in diet and benefiber daily  Symptomatic hemorrhoids: Discussed with her about outpatient hemorrhoid ligation with [facility] banding kit I would like to see her anal fissure to heal more, given at least 4 weeks time prior to performing ligation She will continue hydrocortisone cream twice daily Continue sitz baths  Anemia during pregnancy: Repeat CBC, ferritin, B12 and folate levels today  Follow up in 4 weeks   Jennifer Darby, MD

## 2017-10-16 ENCOUNTER — Encounter: Payer: Self-pay | Admitting: Gastroenterology

## 2017-10-17 ENCOUNTER — Other Ambulatory Visit: Payer: Self-pay | Admitting: Gastroenterology

## 2017-10-17 ENCOUNTER — Other Ambulatory Visit: Payer: Self-pay

## 2017-10-17 MED ORDER — LINACLOTIDE 145 MCG PO CAPS: 145.0000 ug | ORAL_CAPSULE | Freq: Every day | ORAL | 0 refills | Status: DC

## 2017-10-31 NOTE — Telephone Encounter (Signed)
Mirena Received 08/25/17

## 2017-11-14 ENCOUNTER — Ambulatory Visit: Payer: 59 | Admitting: Gastroenterology

## 2017-11-15 ENCOUNTER — Other Ambulatory Visit: Payer: Self-pay | Admitting: Certified Nurse Midwife

## 2017-11-15 MED ORDER — SUMATRIPTAN SUCCINATE 50 MG PO TABS: ORAL_TABLET | ORAL | 3 refills | Status: DC

## 2017-11-17 ENCOUNTER — Ambulatory Visit (INDEPENDENT_AMBULATORY_CARE_PROVIDER_SITE_OTHER): Payer: 59 | Admitting: Gastroenterology

## 2017-11-17 ENCOUNTER — Encounter: Payer: Self-pay | Admitting: Gastroenterology

## 2017-11-17 VITALS — BP 121/81 | HR 77 | Temp 97.9°F | Ht 68.0 in | Wt 217.0 lb

## 2017-11-17 DIAGNOSIS — K641 Second degree hemorrhoids: Secondary | ICD-10-CM | POA: Diagnosis not present

## 2017-11-17 NOTE — Progress Notes (Signed)

## 2017-11-21 ENCOUNTER — Ambulatory Visit: Payer: Self-pay | Admitting: Family Medicine

## 2017-11-21 VITALS — BP 109/71 | HR 84 | Temp 97.7°F | Wt 210.0 lb

## 2017-11-21 DIAGNOSIS — R519 Headache, unspecified: Secondary | ICD-10-CM

## 2017-11-21 DIAGNOSIS — R0981 Nasal congestion: Secondary | ICD-10-CM

## 2017-11-21 DIAGNOSIS — R51 Headache: Secondary | ICD-10-CM

## 2017-11-21 MED ORDER — PROMETHAZINE HCL 25 MG PO TABS: 25.0000 mg | ORAL_TABLET | Freq: Three times a day (TID) | ORAL | 0 refills | Status: DC | PRN

## 2017-11-21 MED ORDER — IPRATROPIUM BROMIDE 0.06 % NA SOLN: 2.0000 | Freq: Four times a day (QID) | NASAL | 0 refills | Status: DC

## 2017-11-21 NOTE — Progress Notes (Signed)
Jennifer Morse is a 27 y.o. female who presents with concerns of nasal congestion and headache for 2 days. [place] cannot breath out of her nose due to the congestion and while she has chronic headaches/ migraines she feel that this condition is not responding to the medication she normally takes because of her nasal/sinus congestion. She has attempted tylenol, motrin, afrin, pseudoephedrine, imitrex (headaches), mucinex, and caffeine without relief. She is 4 months post-partum and breastfeeding.  Review of Systems  Constitutional: Negative for chills, fever and malaise/fatigue.  HENT: Positive for congestion and sinus pain.   Eyes: Negative.   Respiratory: Negative for cough, sputum production and shortness of breath.   Cardiovascular: Negative.   Gastrointestinal: Negative.   Genitourinary: Negative.   Musculoskeletal: Negative.   Skin: Negative.   Neurological: Negative.   Endo/Heme/Allergies: Negative.   Psychiatric/Behavioral: Negative.     O: Vitals:   11/21/17 0947  BP: 109/71  Pulse: 84  Temp: 97.7 F (36.5 C)  SpO2: 96%   Physical Exam  Constitutional: She is oriented to person, place, and time. Vital signs are normal. She appears well-developed and well-nourished.  HENT:  Head: Normocephalic.  Right Ear: Hearing, external ear and ear canal normal. A middle ear effusion is present.  Left Ear: Hearing, external ear and ear canal normal. A middle ear effusion is present.  Nose: Rhinorrhea and sinus tenderness present. Right sinus exhibits maxillary sinus tenderness and frontal sinus tenderness. Left sinus exhibits maxillary sinus tenderness and frontal sinus tenderness.  Mouth/Throat: Uvula is midline and oropharynx is clear and moist.  Eyes: Pupils are equal, round, and reactive to light.  Neck: Normal range of motion. Neck supple.  Cardiovascular: Normal rate.  Pulmonary/Chest: Effort normal and breath sounds normal.  Abdominal: Soft. Bowel sounds are normal.   Lymphadenopathy:       Head (right side): No submental and no submandibular adenopathy present.       Head (left side): No submental and no submandibular adenopathy present.    She has no cervical adenopathy.  Neurological: She is alert and oriented to person, place, and time.  Skin: Skin is warm and dry.    A: 1. Nasal sinus congestion   2. Acute nonintractable headache, unspecified headache type    P: Stop breastfeeding use milk stores until off all medication for this episode for at least 3-5 days post last medication dose. Today take phenergan to break up headache syndrome 1 tab 8 hours apart today only. Start nasal spray tomorrow at least 12 hours after last phenergan dose. F/U in 48 hours if symptoms unimproved. May consider antibiotic use if fever develops or symptoms persist beyond 7-10 day mark. Plan discussed with patient who verbalized understanding and agrees. Medication, use indication and risks discussed with patient who agrees with proposed treatment plan and follow up. Continue tylenol and OTC pseudoephed PRN for symptoms.  1. Nasal sinus congestion - ipratropium (ATROVENT) 0.06 % nasal spray; Place 2 sprays into both nostrils 4 (four) times daily.  2. Acute nonintractable headache, unspecified headache type - promethazine (PHENERGAN) 25 MG tablet; Take 1 tablet (25 mg total) by mouth every 8 (eight) hours as needed for nausea or vomiting.

## 2017-11-21 NOTE — Patient Instructions (Signed)

## 2017-11-21 NOTE — Progress Notes (Signed)
Per patient post nasal drip green mucus, Ha and facial pressure x 2d OTC: mucinex and sudafed  Breast feeding 4 mo

## 2017-11-22 ENCOUNTER — Other Ambulatory Visit: Payer: Self-pay | Admitting: Certified Nurse Midwife

## 2017-11-22 ENCOUNTER — Telehealth: Payer: Self-pay | Admitting: Certified Nurse Midwife

## 2017-11-22 MED ORDER — AMOXICILLIN-POT CLAVULANATE 875-125 MG PO TABS: ORAL_TABLET | ORAL | 0 refills | Status: DC

## 2017-11-22 NOTE — Telephone Encounter (Signed)
[facility] is calling to speak with an Nurse about prescription that was sent in.

## 2017-11-22 NOTE — Telephone Encounter (Signed)
Confirmed with CLG that quantity for augmentin is to be #20 for 1 tab BID x 10 days. Called pharmacy and updated quantity.

## 2017-11-23 ENCOUNTER — Telehealth: Payer: Self-pay | Admitting: Emergency Medicine

## 2017-11-23 NOTE — Telephone Encounter (Signed)
Spoke with patient stated that she still feels the same nasal stopped up and now teeth pain. Patient was just prescribed augmentin via OB yesterday. Informed patient to continue regeiment and if not any better follow up with PCP.

## 2017-11-28 DIAGNOSIS — Z3482 Encounter for supervision of other normal pregnancy, second trimester: Secondary | ICD-10-CM | POA: Diagnosis not present

## 2017-11-28 DIAGNOSIS — Z3483 Encounter for supervision of other normal pregnancy, third trimester: Secondary | ICD-10-CM | POA: Diagnosis not present

## 2017-12-05 ENCOUNTER — Encounter: Payer: Self-pay | Admitting: Gastroenterology

## 2017-12-05 ENCOUNTER — Ambulatory Visit (INDEPENDENT_AMBULATORY_CARE_PROVIDER_SITE_OTHER): Payer: 59 | Admitting: Gastroenterology

## 2017-12-05 VITALS — BP 104/69 | HR 63 | Temp 97.8°F | Ht 68.0 in | Wt 216.2 lb

## 2017-12-05 DIAGNOSIS — K641 Second degree hemorrhoids: Secondary | ICD-10-CM | POA: Diagnosis not present

## 2017-12-05 MED ORDER — DICYCLOMINE HCL 10 MG PO CAPS: 10.0000 mg | ORAL_CAPSULE | Freq: Three times a day (TID) | ORAL | 0 refills | Status: DC

## 2017-12-05 NOTE — Progress Notes (Deleted)
bent

## 2017-12-05 NOTE — Progress Notes (Signed)
PROCEDURE NOTE: The patient presents with symptomatic grade 2 hemorrhoids, unresponsive to maximal medical therapy, requesting rubber band ligation of his/her hemorrhoidal disease.  All risks, benefits and alternative forms of therapy were described and informed consent was obtained.  The decision was made to band the RA internal hemorrhoid, and the [facility] O'Regan System was used to perform band ligation without complication.  Digital anorectal examination was then performed to assure proper positioning of the band, and to adjust the banded tissue as required.  The patient was discharged home without pain or other issues.  Dietary and behavioral recommendations were given and (if necessary - prescriptions were given), along with follow-up instructions.  The patient will return 2 weeks for follow-up and possible additional banding as required.  No complications were encountered and the patient tolerated the procedure well.  Eleftherios Dudenhoeffer R Phoenicia Pirie, MD [address]  [address]  [place], [place] [address]  Main: [phone]  Fax: [phone] Pager: [phone]   

## 2017-12-20 ENCOUNTER — Ambulatory Visit (INDEPENDENT_AMBULATORY_CARE_PROVIDER_SITE_OTHER): Payer: 59 | Admitting: Gastroenterology

## 2017-12-20 ENCOUNTER — Encounter: Payer: Self-pay | Admitting: Gastroenterology

## 2017-12-20 VITALS — BP 97/61 | HR 87 | Temp 97.7°F | Ht 68.0 in | Wt 222.4 lb

## 2017-12-20 DIAGNOSIS — K602 Anal fissure, unspecified: Secondary | ICD-10-CM | POA: Diagnosis not present

## 2017-12-20 NOTE — Progress Notes (Signed)
Cephas Darby, MD [address]  [place]  [place], [place] [address]  Main: [phone]  Fax: [phone]    Gastroenterology Consultation  Referring Provider:     Dalia Heading, CNM Primary Care Physician:  Dalia Heading, CNM Primary Gastroenterologist:  Dr. Cephas Darby Reason for Consultation:     Symptomatic hemorrhoids, anal fissure        HPI:   Jennifer Morse is a 27 y.o. female referred by Dr. Dalia Heading, CNM  for consultation & management of symptomatic hemorrhoids. She has been dealing with severe symptomatic hemorrhoids ever since her first child was born. Symptoms include itching, blood on wiping and sometimes on surface of stool, severe sharp, tearing type of pain during and after bowel movement, straining, constipation, sensation of incomplete emptying. She is taking MiraLAX and Colace, fiber supplements that keeps her bowel movements soft. She was using over-the-counter various hemorrhoid treatment medications with partial benefit. With her second pregnancy, symptoms got worse. She has been seeing Dr. Bary Castilla for anal fissure, followed by thrombosed external hemorrhoid. She was receiving prescriptions for hydrocortisone cream and topical lidocaine. She has been doing this for almost one year. She delivered her last child on 07/08/2017. She denies diarrhea, abdominal pain. She was anemic during her pregnancy, and I do not have follow-up labs since delivery.  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 She is applying nitroglycerin ointment and also hydrocortisone cream which is providing significant relief. She reports that she is able to sit down without any pain after bowel movement which she was not able to do it previously. Also, reports that her bowel movements are closer to getting regular but still clumpy and hard. She is doing MiraLAX and fiber as needed.  Follow  up visit 12/20/17: She completed ligation of RA and RP hemorrhoids. She has been experiencing severe rectal pain every time she has a BM and a/w blood.This started 2 days after 2nd hemorrhoid ligation. She stopped using topical nitro about 4weeks ago when she felt that fissure has healed. She thinks fissure has come back. Her BMs are soft, taking miralax daily  NSAIDs: None  Antiplts/Anticoagulants/Anti thrombotics: None  GI Procedures: None She denies family history of GI malignancy, IBD  Past Medical History:  Diagnosis Date  . Frequent headaches   . Gestational thrombocytopenia ([place])   . Hemorrhoids during pregnancy, antepartum   . Migraine with aura, intractable, with status migrainosus 1995  . Oligomenorrhea   . Postpartum depression    with first pregnancy    Past Surgical History:  Procedure Laterality Date  . WISDOM TOOTH EXTRACTION  2012     Current Outpatient Medications:  .  dicyclomine (BENTYL) 10 MG capsule, Take 1 capsule (10 mg total) by mouth 4 (four) times daily -  before meals and at bedtime., Disp: 10 capsule, Rfl: 0 .  ipratropium (ATROVENT) 0.06 % nasal spray, Place 2 sprays into both nostrils 4 (four) times daily., Disp: 15 mL, Rfl: 0 .  ranitidine (ZANTAC) 150 MG tablet, Take 150 mg by mouth at bedtime., Disp: , Rfl:  .  SUMAtriptan (IMITREX) 50 MG tablet, May repeat in 2 hours if headache persists or recurs. Do not use more than 200mg m/24 hour period, Disp: 10 tablet, Rfl: 3 .  SYNTHROID 25 MCG tablet, TK 1 T PO D, Disp: , Rfl: 5 .  amoxicillin-clavulanate (AUGMENTIN) 875-125 MG tablet, Take one BID x 10 days (Patient  not taking: Reported on 12/05/2017), Disp: 14 tablet, Rfl: 0 .  Lidocaine 2 % GEL, Apply 1 application topically 2 (two) times daily. (Patient not taking: Reported on 12/20/2017), Disp: 30 g, Rfl: 1   Family History  Problem Relation Age of Onset  . Diabetes Mother   . Diabetes Sister   . Diabetes Maternal Grandmother   . Heart disease  Maternal Grandmother   . Hypertension Maternal Grandmother   . Kidney cancer Maternal Grandmother   . Diabetes Maternal Grandfather   . Heart disease Maternal Grandfather   . Hypertension Maternal Grandfather   . Diabetes Maternal Aunt      Social History   Tobacco Use  . Smoking status: Former Smoker    Packs/day: 0.25    Types: Cigarettes    Last attempt to quit: 02/26/2015    Years since quitting: 2.8  . Smokeless tobacco: Former Systems developer    Quit date: 2016  Substance Use Topics  . Alcohol use: No    Alcohol/week: 0.6 oz    Types: 1 Glasses of wine per week  . Drug use: No    Allergies as of 12/20/2017  . (No Known Allergies)    Review of Systems:    All systems reviewed and negative except where noted in HPI.   Physical Exam:  BP 97/61   Pulse 87   Temp 97.7 F (36.5 C) (Oral)   Ht 5\' 8"  (1.727 m)   Wt 222 lb 6.4 oz (100.9 kg)   BMI 33.82 kg/m  No LMP recorded.  General:   Alert,  Well-developed, well-nourished, pleasant and cooperative in NAD Head:  Normocephalic and atraumatic. Eyes:  Sclera clear, no icterus.   Conjunctiva pink. Ears:  Normal auditory acuity. Nose:  No deformity, discharge, or lesions. Mouth:  No deformity or lesions,oropharynx pink & moist. Neck:  Supple; no masses or thyromegaly. Lungs:  Respirations even and unlabored.  Clear throughout to auscultation.   No wheezes, crackles, or rhonchi. No acute distress. Heart:  Regular rate and rhythm; no murmurs, clicks, rubs, or gallops. Abdomen:  Normal bowel sounds. Soft, non-tender and non-distended without masses, hepatosplenomegaly or hernias noted.  No guarding or rebound tenderness.   Rectal: Digital rectal exam revealed tenderness in posterior wall of the anal canal, consistent with fissure. Anal spasm. No perianal fistula or rash.  Msk:  Symmetrical without gross deformities. Good, equal movement & strength bilaterally. Pulses:  Normal pulses noted. Extremities:  No clubbing or edema.  No  cyanosis. Neurologic:  Alert and oriented x3;  grossly normal neurologically. Skin:  Intact without significant lesions or rashes. No jaundice. Lymph Nodes:  No significant cervical adenopathy. Psych:  Alert and cooperative. Normal mood and affect.  Imaging Studies: No recent abdominal imaging  Assessment and Plan:   Jennifer Morse is a 27 y.o. female with history of severe symptomatic external and internal hemorrhoids, anal fissure here for follow-up  Anal fissure: Restart topical 0.125% nitroglycerin and topical lidocaine to apply rectally 2-3 times daily for 2-3 months  Constipation: under control She is taking miralax, not linzess Continue fiber in diet and benefiber daily  Symptomatic hemorrhoids: Will defer hemorrhoid ligation today until her anal fissure heals She will contact my office after 4weeks to schedule appt for ligation   Follow up in 4-6weeks   Cephas Darby, MD

## 2018-01-23 DIAGNOSIS — S39012A Strain of muscle, fascia and tendon of lower back, initial encounter: Secondary | ICD-10-CM | POA: Diagnosis not present

## 2018-01-25 DIAGNOSIS — Z3483 Encounter for supervision of other normal pregnancy, third trimester: Secondary | ICD-10-CM | POA: Diagnosis not present

## 2018-01-25 DIAGNOSIS — Z3482 Encounter for supervision of other normal pregnancy, second trimester: Secondary | ICD-10-CM | POA: Diagnosis not present

## 2018-02-08 DIAGNOSIS — S39012A Strain of muscle, fascia and tendon of lower back, initial encounter: Secondary | ICD-10-CM | POA: Diagnosis not present

## 2018-02-26 ENCOUNTER — Ambulatory Visit: Payer: 59 | Admitting: Certified Nurse Midwife

## 2018-02-26 NOTE — Progress Notes (Deleted)
Gynecology Annual Exam  PCP: Dalia Heading, CNM  Chief Complaint: No chief complaint on file.   History of Present Illness: Jennifer Morse is a 27 y.o. G2P2001 presents for annual exam. The patient {Blank single:19197::"has no complaints today.","complains of ***"}  Her menses are regular, they occur every month, and they last *** days. Her flow is {Blank single:19197::"light","heavy","moderate"}. She {Blank single:19197::"does","does not"} have intermenstrual bleeding. Her last menstrual period was ***. She denies dysmenorrhea. Last pap smear: ***, results were ***   The patient {Blank single:19197::"has never been","is not currently","is not","is"}  sexually active. She currently uses *** for contraception. She {Blank single:19197::"has","does not have"} dyspareunia.  {Blank single:19197::"Since her last visit, she has ***","Since her last visit, she has had no significant changes in her health."}  Her past medical history is remarkable for ***  The patient {Blank single:19197::"does not know how to","does not","does"} perform self breast exams. Her last mammogram was ***, results were ***.   {Blank single:19197::"There is a family history of breast cancer in her ***","There is no family history of breast cancer."} Genetic testing {Blank single:19197::"has","has not"} been done.   {Blank single:19197::"There is a family history of ovarian cancer in her ***","There is no family history of ovarian cancer."} Genetic testing {Blank single:19197::"has","has not"} been done.  The patient {Blank single:19197::"reports smoking. She smokes *** packs per day.","denies smoking."}  She {Blank single:19197::"denies drinking.","reports drinking alcohol. She reports have *** drinks per week."}   She {Blank single:19197::"reports illegal drug use. She uses ***","denies illegal drug use."}  The patient {Blank single:19197::"does not exercise","reports exercising  occasionally","reports exercising regularly"}.  The patient {Blank single:19197::"reports","denies"} current symptoms of depression.    Review of Systems: ROS  Past Medical History:  Past Medical History:  Diagnosis Date  . Frequent headaches   . Gestational thrombocytopenia ([place])   . Hemorrhoids during pregnancy, antepartum   . Migraine with aura, intractable, with status migrainosus 1995  . Oligomenorrhea   . Postpartum depression    with first pregnancy    Past Surgical History:  Past Surgical History:  Procedure Laterality Date  . WISDOM TOOTH EXTRACTION  2012    Family History:  Family History  Problem Relation Age of Onset  . Diabetes Mother   . Diabetes Sister   . Diabetes Maternal Grandmother   . Heart disease Maternal Grandmother   . Hypertension Maternal Grandmother   . Kidney cancer Maternal Grandmother   . Diabetes Maternal Grandfather   . Heart disease Maternal Grandfather   . Hypertension Maternal Grandfather   . Diabetes Maternal Aunt     Social History:  Social History   Socioeconomic History  . Marital status: Married    Spouse name: Edison Nasuti  . Number of children: 2  . Years of education: Not on file  . Highest education level: Not on file  Occupational History  . Occupation: Therapist, sports  Social Needs  . Financial resource strain: Not on file  . Food insecurity:    Worry: Not on file    Inability: Not on file  . Transportation needs:    Medical: Not on file    Non-medical: Not on file  Tobacco Use  . Smoking status: Former Smoker    Packs/day: 0.25    Types: Cigarettes    Last attempt to quit: 02/26/2015    Years since quitting: 3.0  . Smokeless tobacco: Former Systems developer    Quit date: 2016  Substance and Sexual Activity  . Alcohol use: No    Alcohol/week:  0.6 oz    Types: 1 Glasses of wine per week  . Drug use: No  . Sexual activity: Yes    Partners: Male    Birth control/protection: None  Lifestyle  . Physical activity:    Days per week:  Not on file    Minutes per session: Not on file  . Stress: Not on file  Relationships  . Social connections:    Talks on phone: Not on file    Gets together: Not on file    Attends religious service: Not on file    Active member of club or organization: Not on file    Attends meetings of clubs or organizations: Not on file    Relationship status: Not on file  . Intimate partner violence:    Fear of current or ex partner: Not on file    Emotionally abused: Not on file    Physically abused: Not on file    Forced sexual activity: Not on file  Other Topics Concern  . Not on file  Social History Narrative  . Not on file    Allergies:  No Known Allergies  Medications: Prior to Admission medications   Medication Sig Start Date End Date Taking? Authorizing Provider  amoxicillin-clavulanate (AUGMENTIN) 875-125 MG tablet Take one BID x 10 days Patient not taking: Reported on 12/05/2017 11/22/17   Dalia Heading, CNM  dicyclomine (BENTYL) 10 MG capsule Take 1 capsule (10 mg total) by mouth 4 (four) times daily -  before meals and at bedtime. 12/05/17   Lin Landsman, MD  ipratropium (ATROVENT) 0.06 % nasal spray Place 2 sprays into both nostrils 4 (four) times daily. 11/21/17   Shella Maxim, NP  Lidocaine 2 % GEL Apply 1 application topically 2 (two) times daily. Patient not taking: Reported on 12/20/2017 08/25/17   Dalia Heading, CNM  ranitidine (ZANTAC) 150 MG tablet Take 150 mg by mouth at bedtime.    [provider]  SUMAtriptan (IMITREX) 50 MG tablet May repeat in 2 hours if headache persists or recurs. Do not use more than 200mg m/24 hour period 11/15/17   Dalia Heading, CNM  SYNTHROID 25 MCG tablet TK 1 T PO D 10/06/17   [provider]    Physical Exam Vitals: currently breastfeeding.  General: NAD HEENT: normocephalic, anicteric Neck: no thyroid enlargement, no palpable nodules, no cervical lymphadenopathy  Pulmonary: No increased work of  breathing, CTAB Cardiovascular: RRR, {Blank single:19197::"with murmur","without murmur"}  Breast: Breast symmetrical, no tenderness, no palpable nodules or masses, no skin or nipple retraction present, no nipple discharge.  No axillary, infraclavicular or supraclavicular lymphadenopathy. Abdomen: Soft, non-tender, non-distended.  Umbilicus without lesions.  No hepatomegaly or masses palpable. No evidence of hernia. Genitourinary:  External: Normal external female genitalia.  Normal urethral meatus, normal  Bartholin's and Skene's glands.    Vagina: Normal vaginal mucosa, no evidence of prolapse.    Cervix: Grossly normal in appearance, no bleeding, non-tender  Uterus: Anteverted, normal size, shape, and consistency, mobile, and non-tender  Adnexa: No adnexal masses, non-tender  Rectal: deferred  Lymphatic: no evidence of inguinal lymphadenopathy Extremities: no edema, erythema, or tenderness Neurologic: Grossly intact Psychiatric: mood appropriate, affect full     Assessment: 27 y.o. G2P2001 No problem-specific Assessment & Plan notes found for this encounter.   Plan:  ***  1) Breast cancer screening - recommend monthly self breast exam. {Blank single:19197::" ","Mammogram is up to date.","Mammogram was ordered today."}  2) STI screening was offered and {Blank single:19197::"accepted","declined"}.  3) Cervical cancer screening - {Blank single:19197::"Pap smear due in *** years","Pap not indicated","Pap was done"}. ASCCP guidelines and rational discussed.  Patient opts for {Blank single:19197::"every 5 years","every 3 years","yearly"} screening interval  4) Contraception - Education given regarding options for contraception  5) Routine healthcare maintenance including cholesterol and diabetes screening {Blank single:19197::"declined","managed by PCP","ordered today"}

## 2018-03-01 ENCOUNTER — Encounter: Payer: Self-pay | Admitting: Certified Nurse Midwife

## 2018-03-02 ENCOUNTER — Ambulatory Visit (INDEPENDENT_AMBULATORY_CARE_PROVIDER_SITE_OTHER): Payer: 59 | Admitting: Certified Nurse Midwife

## 2018-03-02 VITALS — BP 110/79 | HR 71 | Ht 68.0 in | Wt 227.5 lb

## 2018-03-02 DIAGNOSIS — Z6834 Body mass index (BMI) 34.0-34.9, adult: Secondary | ICD-10-CM | POA: Diagnosis not present

## 2018-03-02 DIAGNOSIS — Z01419 Encounter for gynecological examination (general) (routine) without abnormal findings: Secondary | ICD-10-CM | POA: Diagnosis not present

## 2018-03-02 DIAGNOSIS — Z713 Dietary counseling and surveillance: Secondary | ICD-10-CM | POA: Diagnosis not present

## 2018-03-02 DIAGNOSIS — Z124 Encounter for screening for malignant neoplasm of cervix: Secondary | ICD-10-CM

## 2018-03-02 MED ORDER — CYANOCOBALAMIN 1000 MCG/ML IJ SOLN: 1000.0000 ug | INTRAMUSCULAR | 2 refills | Status: DC

## 2018-03-02 MED ORDER — PHENTERMINE HCL 37.5 MG PO CAPS: 37.5000 mg | ORAL_CAPSULE | ORAL | 2 refills | Status: DC

## 2018-03-02 NOTE — Progress Notes (Signed)
ANNUAL PREVENTATIVE CARE GYN  ENCOUNTER NOTE  Subjective:       Jennifer Morse is a 27 y.o. G4P2001 female here for a routine annual gynecologic exam.  Current complaints: 1. Weight gain  Denies difficulty breathing or respiratory distress, chest pain, abdominal pain, vaginal bleeding, dysuria, and leg pain or swelling.    Gynecologic History  No LMP recorded. (Menstrual status: IUD).  Contraception: IUD, Mirena  Last Pap: 2017. Results were: normal  Obstetric History  OB History  Gravida Para Term Preterm AB Living  2 2 2  0 0 1  SAB TAB Ectopic Multiple Live Births  0 0 0 0 1    # Outcome Date GA Lbr Len/2nd Weight Sex Delivery Anes PTL Lv  2 Term 07/08/17 [redacted]w[redacted]d 15:15 / 00:28 8 lb 2.9 oz (3.71 kg) M Vag-Spont EPI N LIV     Birth Comments: Dimpled cheek (right)  1 Term 07/01/15 [redacted]w[redacted]d 218:30 / 02:26 8 lb 2.9 oz (3.71 kg) F Vag-Spont EPI  LIV    Obstetric Comments  1st Menstrual Cycle:  15   1st Pregnancy:  24    Past Medical History:  Diagnosis Date  . Frequent headaches   . Gestational thrombocytopenia ([place])   . Hemorrhoids during pregnancy, antepartum   . Migraine with aura, intractable, with status migrainosus 1995  . Oligomenorrhea   . Postpartum depression    with first pregnancy    Past Surgical History:  Procedure Laterality Date  . [place] EXTRACTION  2012    Current Outpatient Medications on File Prior to Visit  Medication Sig Dispense Refill  . hydrocortisone-pramoxine (ANALPRAM-HC) 2.5-1 % rectal cream hydrocortisone-pramoxine 2.5 %-1 % rectal cream    . ipratropium (ATROVENT) 0.06 % nasal spray Place 2 sprays into both nostrils 4 (four) times daily. 15 mL 0  . ranitidine (ZANTAC) 150 MG tablet Take 150 mg by mouth at bedtime.    . SUMAtriptan (IMITREX) 50 MG tablet May repeat in 2 hours if headache persists or recurs. Do not use more than 200mg m/24 hour period 10 tablet 3  . SYNTHROID 25 MCG tablet TK 1 T PO D  5  . amoxicillin-clavulanate  (AUGMENTIN) 875-125 MG tablet Take one BID x 10 days (Patient not taking: Reported on 12/05/2017) 14 tablet 0  . dicyclomine (BENTYL) 10 MG capsule Take 1 capsule (10 mg total) by mouth 4 (four) times daily -  before meals and at bedtime. (Patient not taking: Reported on 03/02/2018) 10 capsule 0  . Lidocaine 2 % GEL Apply 1 application topically 2 (two) times daily. (Patient not taking: Reported on 12/20/2017) 30 g 1   No current facility-administered medications on file prior to visit.     No Known Allergies  Social History   Socioeconomic History  . Marital status: Married    Spouse name: Edison Nasuti  . Number of children: 2  . Years of education: Not on file  . Highest education level: Not on file  Occupational History  . Occupation: Therapist, sports  Social Needs  . Financial resource strain: Not on file  . Food insecurity:    Worry: Not on file    Inability: Not on file  . Transportation needs:    Medical: Not on file    Non-medical: Not on file  Tobacco Use  . Smoking status: Former Smoker    Packs/day: 0.25    Types: Cigarettes    Last attempt to quit: 02/26/2015    Years since quitting: 3.0  . Smokeless tobacco:  Former Systems developer    Quit date: 2016  Substance and Sexual Activity  . Alcohol use: No    Alcohol/week: 0.6 oz    Types: 1 Glasses of wine per week  . Drug use: No  . Sexual activity: Yes    Partners: Male    Birth control/protection: None  Lifestyle  . Physical activity:    Days per week: Not on file    Minutes per session: Not on file  . Stress: Not on file  Relationships  . Social connections:    Talks on phone: Not on file    Gets together: Not on file    Attends religious service: Not on file    Active member of club or organization: Not on file    Attends meetings of clubs or organizations: Not on file    Relationship status: Not on file  . Intimate partner violence:    Fear of current or ex partner: Not on file    Emotionally abused: Not on file    Physically  abused: Not on file    Forced sexual activity: Not on file  Other Topics Concern  . Not on file  Social History Narrative  . Not on file    Family History  Problem Relation Age of Onset  . Diabetes Mother   . Diabetes Sister   . Diabetes Maternal Grandmother   . Heart disease Maternal Grandmother   . Hypertension Maternal Grandmother   . Kidney cancer Maternal Grandmother   . Diabetes Maternal Grandfather   . Heart disease Maternal Grandfather   . Hypertension Maternal Grandfather   . Diabetes Maternal Aunt     The following portions of the patient's history were reviewed and updated as appropriate: allergies, current medications, past family history, past medical history, past social history, past surgical history and problem list.  Review of Systems  ROS negative except as noted above. Information obtained from patient.    Objective:   BP 110/79   Pulse 71   Ht 5\' 8"  (1.727 m)   Wt 227 lb 8 oz (103.2 kg)   Breastfeeding? No   BMI 34.59 kg/m   CONSTITUTIONAL: Well-developed, well-nourished female in no acute distress.   PSYCHIATRIC: Normal mood and affect. Normal behavior. Normal judgment and thought content.  [place]: Alert and oriented to person, place, and time. Normal muscle tone coordination. No cranial nerve deficit noted.  HENT:  Normocephalic, atraumatic, External right and left ear normal.   EYES: Conjunctivae and EOM are normal. Pupils are equal and round.  NECK: Normal range of motion, supple, no masses.  Normal thyroid.   SKIN: Skin is warm and dry. No rash noted. Not diaphoretic. No erythema. No pallor.  CARDIOVASCULAR: Normal heart rate noted, regular rhythm, no murmur.  RESPIRATORY: Clear to auscultation bilaterally. Effort and breath sounds normal, no problems with  respiration noted.  BREASTS: Symmetric in size. No masses, skin changes, nipple drainage, or lymphadenopathy.  ABDOMEN: Soft, normal bowel sounds, no distention noted.  No  tenderness, rebound or guarding.   PELVIC:  External Genitalia: Normal  Vagina: Normal  Cervix: Normal  Uterus: Normal  Adnexa: Normal  MUSCULOSKELETAL: Normal range of motion. No tenderness.  No cyanosis, clubbing, or edema.  2+ distal pulses.  LYMPHATIC: No Axillary, Supraclavicular, or Inguinal Adenopathy.  Assessment:   Annual gynecologic examination 27 y.o.   Contraception: IUD, Mirena   Obesity 1   Problem List Items Addressed This Visit    None    Visit Diagnoses  BMI 34.0-34.9,adult    -  Primary   Relevant Orders   Thyroid Panel With TSH   Lipid panel   Comprehensive metabolic panel   Hemoglobin A1c   Well woman exam       Relevant Orders   Pap IG, rfx HPV ASCU,16/18   CBC   Thyroid Panel With TSH   Lipid panel   Comprehensive metabolic panel   Hemoglobin A1c   Screening for cervical cancer       Relevant Orders   Pap IG, rfx HPV ASCU,16/18   Encounter for weight loss counseling          Plan:   Pap: Pap, Reflex if ASCUS  Labs: See orders  Routine preventative health maintenance measures emphasized: Exercise/Diet/Weight control, Tobacco Warnings, Alcohol/Substance use risks and Stress Management; see AVS   Rx: Phentermine and B12, see orders  RTC x 1 month for weight, blood pressure check, and b12  RTC x 1 year for Annual Exam or sooner if needed   Diona Fanti, CNM [facility], [facility]

## 2018-03-02 NOTE — Patient Instructions (Signed)
Cyanocobalamin, Vitamin B12 injection What is this medicine? CYANOCOBALAMIN (sye an oh koe BAL a min) is a man made form of vitamin B12. Vitamin B12 is used in the growth of healthy blood cells, nerve cells, and proteins in the body. It also helps with the metabolism of fats and carbohydrates. This medicine is used to treat people who can not absorb vitamin B12. This medicine may be used for other purposes; ask your health care provider or pharmacist if you have questions. COMMON BRAND NAME(S): B-12 Compliance Kit, B-12 Injection Kit, Cyomin, LA-12, Nutri-Twelve, Physicians EZ Use B-12, Primabalt What should I tell my health care provider before I take this medicine? They need to know if you have any of these conditions: -kidney disease -Leber's disease -megaloblastic anemia -an unusual or allergic reaction to cyanocobalamin, cobalt, other medicines, foods, dyes, or preservatives -pregnant or trying to get pregnant -breast-feeding How should I use this medicine? This medicine is injected into a muscle or deeply under the skin. It is usually given by a health care professional in a clinic or doctor's office. However, your doctor may teach you how to inject yourself. Follow all instructions. Talk to your pediatrician regarding the use of this medicine in children. Special care may be needed. Overdosage: If you think you have taken too much of this medicine contact a poison control center or emergency room at once. NOTE: This medicine is only for you. Do not share this medicine with others. What if I miss a dose? If you are given your dose at a clinic or doctor's office, call to reschedule your appointment. If you give your own injections and you miss a dose, take it as soon as you can. If it is almost time for your next dose, take only that dose. Do not take double or extra doses. What may interact with this medicine? -colchicine -heavy alcohol intake This list may not describe all possible  interactions. Give your health care provider a list of all the medicines, herbs, non-prescription drugs, or dietary supplements you use. Also tell them if you smoke, drink alcohol, or use illegal drugs. Some items may interact with your medicine. What should I watch for while using this medicine? Visit your doctor or health care professional regularly. You may need blood work done while you are taking this medicine. You may need to follow a special diet. Talk to your doctor. Limit your alcohol intake and avoid smoking to get the best benefit. What side effects may I notice from receiving this medicine? Side effects that you should report to your doctor or health care professional as soon as possible: -allergic reactions like skin rash, itching or hives, swelling of the face, lips, or tongue -blue tint to skin -chest tightness, pain -difficulty breathing, wheezing -dizziness -red, swollen painful area on the leg Side effects that usually do not require medical attention (report to your doctor or health care professional if they continue or are bothersome): -diarrhea -headache This list may not describe all possible side effects. Call your doctor for medical advice about side effects. You may report side effects to FDA at 1-800-FDA-1088. Where should I keep my medicine? Keep out of the reach of children. Store at room temperature between 15 and 30 degrees C (59 and 85 degrees F). Protect from light. Throw away any unused medicine after the expiration date. NOTE: This sheet is a summary. It may not cover all possible information. If you have questions about this medicine, talk to your doctor, pharmacist, or   health care provider.  2018 Elsevier/Gold Standard (2007-12-03 22:10:20) Phentermine tablets or capsules What is this medicine? PHENTERMINE (FEN ter meen) decreases your appetite. It is used with a reduced calorie diet and exercise to help you lose weight. This medicine may be used for other  purposes; ask your health care provider or pharmacist if you have questions. COMMON BRAND NAME(S): Adipex-P, Atti-Plex P, Atti-Plex P Spansule, Fastin, Lomaira, Pro-Fast, Tara-8 What should I tell my health care provider before I take this medicine? They need to know if you have any of these conditions: -agitation -glaucoma -heart disease -high blood pressure -history of substance abuse -lung disease called Primary Pulmonary Hypertension (PPH) -taken an MAOI like Carbex, Eldepryl, Marplan, Nardil, or Parnate in last 14 days -thyroid disease -an unusual or allergic reaction to phentermine, other medicines, foods, dyes, or preservatives -pregnant or trying to get pregnant -breast-feeding How should I use this medicine? Take this medicine by mouth with a glass of water. Follow the directions on the prescription label. The instructions for use may differ based on the product and dose you are taking. Avoid taking this medicine in the evening. It may interfere with sleep. Take your doses at regular intervals. Do not take your medicine more often than directed. Talk to your pediatrician regarding the use of this medicine in children. While this drug may be prescribed for children 17 years or older for selected conditions, precautions do apply. Overdosage: If you think you have taken too much of this medicine contact a poison control center or emergency room at once. NOTE: This medicine is only for you. Do not share this medicine with others. What if I miss a dose? If you miss a dose, take it as soon as you can. If it is almost time for your next dose, take only that dose. Do not take double or extra doses. What may interact with this medicine? Do not take this medicine with any of the following medications: -duloxetine -MAOIs like Carbex, Eldepryl, Marplan, Nardil, and Parnate -medicines for colds or breathing difficulties like pseudoephedrine or phenylephrine -procarbazine -sibutramine -SSRIs  like citalopram, escitalopram, fluoxetine, fluvoxamine, paroxetine, and sertraline -stimulants like dexmethylphenidate, methylphenidate or modafinil -venlafaxine This medicine may also interact with the following medications: -medicines for diabetes This list may not describe all possible interactions. Give your health care provider a list of all the medicines, herbs, non-prescription drugs, or dietary supplements you use. Also tell them if you smoke, drink alcohol, or use illegal drugs. Some items may interact with your medicine. What should I watch for while using this medicine? Notify your physician immediately if you become short of breath while doing your normal activities. Do not take this medicine within 6 hours of bedtime. It can keep you from getting to sleep. Avoid drinks that contain caffeine and try to stick to a regular bedtime every night. This medicine was intended to be used in addition to a healthy diet and exercise. The best results are achieved this way. This medicine is only indicated for short-term use. Eventually your weight loss may level out. At that point, the drug will only help you maintain your new weight. Do not increase or in any way change your dose without consulting your doctor. You may get drowsy or dizzy. Do not drive, use machinery, or do anything that needs mental alertness until you know how this medicine affects you. Do not stand or sit up quickly, especially if you are an older patient. This reduces the risk of dizzy or  fainting spells. Alcohol may increase dizziness and drowsiness. Avoid alcoholic drinks. What side effects may I notice from receiving this medicine? Side effects that you should report to your doctor or health care professional as soon as possible: -chest pain, palpitations -depression or severe changes in mood -increased blood pressure -irritability -nervousness or restlessness -severe dizziness -shortness of breath -problems  urinating -unusual swelling of the legs -vomiting Side effects that usually do not require medical attention (report to your doctor or health care professional if they continue or are bothersome): -blurred vision or other eye problems -changes in sexual ability or desire -constipation or diarrhea -difficulty sleeping -dry mouth or unpleasant taste -headache -nausea This list may not describe all possible side effects. Call your doctor for medical advice about side effects. You may report side effects to FDA at 1-800-FDA-1088. Where should I keep my medicine? Keep out of the reach of children. This medicine can be abused. Keep your medicine in a safe place to protect it from theft. Do not share this medicine with anyone. Selling or giving away this medicine is dangerous and against the law. This medicine may cause accidental overdose and death if taken by other adults, children, or pets. Mix any unused medicine with a substance like cat litter or coffee grounds. Then throw the medicine away in a sealed container like a sealed bag or a coffee can with a lid. Do not use the medicine after the expiration date. Store at room temperature between 20 and 25 degrees C (68 and 77 degrees F). Keep container tightly closed. NOTE: This sheet is a summary. It may not cover all possible information. If you have questions about this medicine, talk to your doctor, pharmacist, or health care provider.  2018 Elsevier/Gold Standard (2015-05-29 12:53:15) Levonorgestrel intrauterine device (IUD) What is this medicine? LEVONORGESTREL IUD (LEE voe nor jes trel) is a contraceptive (birth control) device. The device is placed inside the uterus by a healthcare professional. It is used to prevent pregnancy. This device can also be used to treat heavy bleeding that occurs during your period. This medicine may be used for other purposes; ask your health care provider or pharmacist if you have questions. COMMON BRAND  NAME(S): Minette [place] What should I tell my health care provider before I take this medicine? They need to know if you have any of these conditions: -abnormal Pap smear -cancer of the breast, uterus, or cervix -diabetes -endometritis -genital or pelvic infection now or in the past -have more than one sexual partner or your partner has more than one partner -heart disease -history of an ectopic or tubal pregnancy -immune system problems -IUD in place -liver disease or tumor -problems with blood clots or take blood-thinners -seizures -use intravenous drugs -uterus of unusual shape -vaginal bleeding that has not been explained -an unusual or allergic reaction to levonorgestrel, other hormones, silicone, or polyethylene, medicines, foods, dyes, or preservatives -pregnant or trying to get pregnant -breast-feeding How should I use this medicine? This device is placed inside the uterus by a health care professional. Talk to your pediatrician regarding the use of this medicine in children. Special care may be needed. Overdosage: If you think you have taken too much of this medicine contact a poison control center or emergency room at once. NOTE: This medicine is only for you. Do not share this medicine with others. What if I miss a dose? This does not apply. Depending on the brand of device you have inserted, the device will  need to be replaced every 3 to 5 years if you wish to continue using this type of birth control. What may interact with this medicine? Do not take this medicine with any of the following medications: -amprenavir -bosentan -fosamprenavir This medicine may also interact with the following medications: -aprepitant -armodafinil -barbiturate medicines for inducing sleep or treating seizures -bexarotene -boceprevir -griseofulvin -medicines to treat seizures like carbamazepine, ethotoin, felbamate, oxcarbazepine, phenytoin,  topiramate -modafinil -pioglitazone -rifabutin -rifampin -rifapentine -some medicines to treat HIV infection like atazanavir, efavirenz, indinavir, lopinavir, nelfinavir, tipranavir, ritonavir -St. John's wort -warfarin This list may not describe all possible interactions. Give your health care provider a list of all the medicines, herbs, non-prescription drugs, or dietary supplements you use. Also tell them if you smoke, drink alcohol, or use illegal drugs. Some items may interact with your medicine. What should I watch for while using this medicine? Visit your doctor or health care professional for regular check ups. See your doctor if you or your partner has sexual contact with others, becomes HIV positive, or gets a sexual transmitted disease. This product does not protect you against HIV infection (AIDS) or other sexually transmitted diseases. You can check the placement of the IUD yourself by reaching up to the top of your vagina with clean fingers to feel the threads. Do not pull on the threads. It is a good habit to check placement after each menstrual period. Call your doctor right away if you feel more of the IUD than just the threads or if you cannot feel the threads at all. The IUD may come out by itself. You may become pregnant if the device comes out. If you notice that the IUD has come out use a backup birth control method like condoms and call your health care provider. Using tampons will not change the position of the IUD and are okay to use during your period. This IUD can be safely scanned with magnetic resonance imaging (MRI) only under specific conditions. Before you have an MRI, tell your healthcare provider that you have an IUD in place, and which type of IUD you have in place. What side effects may I notice from receiving this medicine? Side effects that you should report to your doctor or health care professional as soon as possible: -allergic reactions like skin rash,  itching or hives, swelling of the face, lips, or tongue -fever, flu-like symptoms -genital sores -high blood pressure -no menstrual period for 6 weeks during use -pain, swelling, warmth in the leg -pelvic pain or tenderness -severe or sudden headache -signs of pregnancy -stomach cramping -sudden shortness of breath -trouble with balance, talking, or walking -unusual vaginal bleeding, discharge -yellowing of the eyes or skin Side effects that usually do not require medical attention (report to your doctor or health care professional if they continue or are bothersome): -acne -breast pain -change in sex drive or performance -changes in weight -cramping, dizziness, or faintness while the device is being inserted -headache -irregular menstrual bleeding within first 3 to 6 months of use -nausea This list may not describe all possible side effects. Call your doctor for medical advice about side effects. You may report side effects to FDA at 1-800-FDA-1088. Where should I keep my medicine? This does not apply. NOTE: This sheet is a summary. It may not cover all possible information. If you have questions about this medicine, talk to your doctor, pharmacist, or health care provider.  2018 Elsevier/Gold Standard (2016-06-03 14:14:56) Preventive Care 18-39 Years, Female Preventive care  refers to lifestyle choices and visits with your health care provider that can promote health and wellness. What does preventive care include?  A yearly physical exam. This is also called an annual well check.  Dental exams once or twice a year.  Routine eye exams. Ask your health care provider how often you should have your eyes checked.  Personal lifestyle choices, including: ? Daily care of your teeth and gums. ? Regular physical activity. ? Eating a healthy diet. ? Avoiding tobacco and drug use. ? Limiting alcohol use. ? Practicing safe sex. ? Taking vitamin and mineral supplements as  recommended by your health care provider. What happens during an annual well check? The services and screenings done by your health care provider during your annual well check will depend on your age, overall health, lifestyle risk factors, and family history of disease. Counseling Your health care provider may ask you questions about your:  Alcohol use.  Tobacco use.  Drug use.  Emotional well-being.  Home and relationship well-being.  Sexual activity.  Eating habits.  Work and work Statistician.  Method of birth control.  Menstrual cycle.  Pregnancy history.  Screening You may have the following tests or measurements:  Height, weight, and BMI.  Diabetes screening. This is done by checking your blood sugar (glucose) after you have not eaten for a while (fasting).  Blood pressure.  Lipid and cholesterol levels. These may be checked every 5 years starting at age 6.  Skin check.  Hepatitis C blood test.  Hepatitis B blood test.  Sexually transmitted disease (STD) testing.  BRCA-related cancer screening. This may be done if you have a family history of breast, ovarian, tubal, or peritoneal cancers.  Pelvic exam and Pap test. This may be done every 3 years starting at age 3. Starting at age 81, this may be done every 5 years if you have a Pap test in combination with an HPV test.  Discuss your test results, treatment options, and if necessary, the need for more tests with your health care provider. Vaccines Your health care provider may recommend certain vaccines, such as:  Influenza vaccine. This is recommended every year.  Tetanus, diphtheria, and acellular pertussis (Tdap, Td) vaccine. You may need a Td booster every 10 years.  Varicella vaccine. You may need this if you have not been vaccinated.  HPV vaccine. If you are 13 or younger, you may need three doses over 6 months.  Measles, mumps, and rubella (MMR) vaccine. You may need at least one dose of  MMR. You may also need a second dose.  Pneumococcal 13-valent conjugate (PCV13) vaccine. You may need this if you have certain conditions and were not previously vaccinated.  Pneumococcal polysaccharide (PPSV23) vaccine. You may need one or two doses if you smoke cigarettes or if you have certain conditions.  Meningococcal vaccine. One dose is recommended if you are age 59-21 years and a first-year college student living in a residence hall, or if you have one of several medical conditions. You may also need additional booster doses.  Hepatitis A vaccine. You may need this if you have certain conditions or if you travel or work in places where you may be exposed to hepatitis A.  Hepatitis B vaccine. You may need this if you have certain conditions or if you travel or work in places where you may be exposed to hepatitis B.  Haemophilus influenzae type b (Hib) vaccine. You may need this if you have certain risk factors.  Talk to your health care provider about which screenings and vaccines you need and how often you need them. This information is not intended to replace advice given to you by your health care provider. Make sure you discuss any questions you have with your health care provider. Document Released: 10/18/2001 Document Revised: 05/11/2016 Document Reviewed: 06/23/2015 Elsevier Interactive Patient Education  [employer].

## 2018-03-02 NOTE — Progress Notes (Signed)
Pt is here for annual exam. LPS 2017 WNL.

## 2018-03-03 LAB — THYROID PANEL WITH TSH
FREE THYROXINE INDEX: 1.5 (ref 1.2–4.9)
T3 UPTAKE RATIO: 24 % (ref 24–39)
T4 TOTAL: 6.1 ug/dL (ref 4.5–12.0)
TSH: 3.15 u[IU]/mL (ref 0.450–4.500)

## 2018-03-03 LAB — LIPID PANEL
CHOL/HDL RATIO: 3.9 ratio (ref 0.0–4.4)
Cholesterol, Total: 181 mg/dL (ref 100–199)
HDL: 47 mg/dL (ref >39)
LDL Calculated: 112 mg/dL — ABNORMAL HIGH (ref 0–99)
Triglycerides: 110 mg/dL (ref 0–149)
VLDL Cholesterol Cal: 22 mg/dL (ref 5–40)

## 2018-03-03 LAB — COMPREHENSIVE METABOLIC PANEL
ALBUMIN: 4.5 g/dL (ref 3.5–5.5)
ALT: 30 IU/L (ref 0–32)
AST: 22 IU/L (ref 0–40)
Albumin/Globulin Ratio: 1.9 (ref 1.2–2.2)
Alkaline Phosphatase: 81 IU/L (ref 39–117)
BUN/Creatinine Ratio: 15 (ref 9–23)
BUN: 12 mg/dL (ref 6–20)
Bilirubin Total: 0.3 mg/dL (ref 0.0–1.2)
CALCIUM: 9.5 mg/dL (ref 8.7–10.2)
CHLORIDE: 103 mmol/L (ref 96–106)
CO2: 21 mmol/L (ref 20–29)
CREATININE: 0.78 mg/dL (ref 0.57–1.00)
GFR, EST AFRICAN AMERICAN: 120 mL/min/{1.73_m2} (ref >59)
GFR, EST NON AFRICAN AMERICAN: 104 mL/min/{1.73_m2} (ref >59)
GLUCOSE: 84 mg/dL (ref 65–99)
Globulin, Total: 2.4 g/dL (ref 1.5–4.5)
Potassium: 3.9 mmol/L (ref 3.5–5.2)
Sodium: 137 mmol/L (ref 134–144)
TOTAL PROTEIN: 6.9 g/dL (ref 6.0–8.5)

## 2018-03-03 LAB — CBC
Hematocrit: 38.6 % (ref 34.0–46.6)
Hemoglobin: 13.2 g/dL (ref 11.1–15.9)
MCH: 28.9 pg (ref 26.6–33.0)
MCHC: 34.2 g/dL (ref 31.5–35.7)
MCV: 85 fL (ref 79–97)
Platelets: 161 10*3/uL (ref 150–450)
RBC: 4.57 x10E6/uL (ref 3.77–5.28)
RDW: 13.9 % (ref 12.3–15.4)
WBC: 5.1 10*3/uL (ref 3.4–10.8)

## 2018-03-03 LAB — HEMOGLOBIN A1C
ESTIMATED AVERAGE GLUCOSE: 105 mg/dL
HEMOGLOBIN A1C: 5.3 % (ref 4.8–5.6)

## 2018-03-05 DIAGNOSIS — Z6834 Body mass index (BMI) 34.0-34.9, adult: Secondary | ICD-10-CM | POA: Insufficient documentation

## 2018-03-06 LAB — PAP IG, RFX HPV ASCU,16/18: PAP Smear Comment: 0

## 2018-03-27 ENCOUNTER — Encounter: Payer: Self-pay | Admitting: Certified Nurse Midwife

## 2018-03-28 DIAGNOSIS — K648 Other hemorrhoids: Secondary | ICD-10-CM | POA: Diagnosis not present

## 2018-03-28 DIAGNOSIS — G43D0 Abdominal migraine, not intractable: Secondary | ICD-10-CM | POA: Diagnosis not present

## 2018-03-28 DIAGNOSIS — E039 Hypothyroidism, unspecified: Secondary | ICD-10-CM | POA: Diagnosis not present

## 2018-03-28 DIAGNOSIS — E042 Nontoxic multinodular goiter: Secondary | ICD-10-CM | POA: Diagnosis not present

## 2018-04-05 ENCOUNTER — Ambulatory Visit (INDEPENDENT_AMBULATORY_CARE_PROVIDER_SITE_OTHER): Payer: 59 | Admitting: Certified Nurse Midwife

## 2018-04-05 VITALS — BP 102/72 | HR 85 | Ht 68.0 in | Wt 211.6 lb

## 2018-04-05 DIAGNOSIS — R634 Abnormal weight loss: Secondary | ICD-10-CM

## 2018-04-05 DIAGNOSIS — E663 Overweight: Secondary | ICD-10-CM | POA: Diagnosis not present

## 2018-04-05 DIAGNOSIS — Z0130 Encounter for examination of blood pressure without abnormal findings: Secondary | ICD-10-CM

## 2018-04-05 DIAGNOSIS — T50905A Adverse effect of unspecified drugs, medicaments and biological substances, initial encounter: Principal | ICD-10-CM

## 2018-04-05 MED ORDER — CYANOCOBALAMIN 1000 MCG/ML IJ SOLN
1000.0000 ug | Freq: Once | INTRAMUSCULAR | Status: AC
  Administered 2018-04-05: 1000 ug via INTRAMUSCULAR

## 2018-04-05 NOTE — Progress Notes (Signed)
I have reviewed the record and concur with patient management and plan.    Diona Fanti, CNM [facility], [facility] 04/05/18 11:00 AM

## 2018-04-05 NOTE — Patient Instructions (Signed)
Cyanocobalamin, Vitamin B12 injection What is this medicine? CYANOCOBALAMIN (sye an oh koe BAL a min) is a man made form of vitamin B12. Vitamin B12 is used in the growth of healthy blood cells, nerve cells, and proteins in the body. It also helps with the metabolism of fats and carbohydrates. This medicine is used to treat people who can not absorb vitamin B12. This medicine may be used for other purposes; ask your health care provider or pharmacist if you have questions. COMMON BRAND NAME(S): B-12 Compliance Kit, B-12 Injection Kit, Cyomin, LA-12, Nutri-Twelve, Physicians EZ Use B-12, Primabalt What should I tell my health care provider before I take this medicine? They need to know if you have any of these conditions: -kidney disease -Leber's disease -megaloblastic anemia -an unusual or allergic reaction to cyanocobalamin, cobalt, other medicines, foods, dyes, or preservatives -pregnant or trying to get pregnant -breast-feeding How should I use this medicine? This medicine is injected into a muscle or deeply under the skin. It is usually given by a health care professional in a clinic or doctor's office. However, your doctor may teach you how to inject yourself. Follow all instructions. Talk to your pediatrician regarding the use of this medicine in children. Special care may be needed. Overdosage: If you think you have taken too much of this medicine contact a poison control center or emergency room at once. NOTE: This medicine is only for you. Do not share this medicine with others. What if I miss a dose? If you are given your dose at a clinic or doctor's office, call to reschedule your appointment. If you give your own injections and you miss a dose, take it as soon as you can. If it is almost time for your next dose, take only that dose. Do not take double or extra doses. What may interact with this medicine? -colchicine -heavy alcohol intake This list may not describe all possible  interactions. Give your health care provider a list of all the medicines, herbs, non-prescription drugs, or dietary supplements you use. Also tell them if you smoke, drink alcohol, or use illegal drugs. Some items may interact with your medicine. What should I watch for while using this medicine? Visit your doctor or health care professional regularly. You may need blood work done while you are taking this medicine. You may need to follow a special diet. Talk to your doctor. Limit your alcohol intake and avoid smoking to get the best benefit. What side effects may I notice from receiving this medicine? Side effects that you should report to your doctor or health care professional as soon as possible: -allergic reactions like skin rash, itching or hives, swelling of the face, lips, or tongue -blue tint to skin -chest tightness, pain -difficulty breathing, wheezing -dizziness -red, swollen painful area on the leg Side effects that usually do not require medical attention (report to your doctor or health care professional if they continue or are bothersome): -diarrhea -headache This list may not describe all possible side effects. Call your doctor for medical advice about side effects. You may report side effects to FDA at 1-800-FDA-1088. Where should I keep my medicine? Keep out of the reach of children. Store at room temperature between 15 and 30 degrees C (59 and 85 degrees F). Protect from light. Throw away any unused medicine after the expiration date. NOTE: This sheet is a summary. It may not cover all possible information. If you have questions about this medicine, talk to your doctor, pharmacist, or   health care provider.  2018 Elsevier/Gold Standard (2007-12-03 22:10:20) Phentermine tablets or capsules What is this medicine? PHENTERMINE (FEN ter meen) decreases your appetite. It is used with a reduced calorie diet and exercise to help you lose weight. This medicine may be used for other  purposes; ask your health care provider or pharmacist if you have questions. COMMON BRAND NAME(S): Adipex-P, Atti-Plex P, Atti-Plex P Spansule, Fastin, Lomaira, Pro-Fast, Tara-8 What should I tell my health care provider before I take this medicine? They need to know if you have any of these conditions: -agitation -glaucoma -heart disease -high blood pressure -history of substance abuse -lung disease called Primary Pulmonary Hypertension (PPH) -taken an MAOI like Carbex, Eldepryl, Marplan, Nardil, or Parnate in last 14 days -thyroid disease -an unusual or allergic reaction to phentermine, other medicines, foods, dyes, or preservatives -pregnant or trying to get pregnant -breast-feeding How should I use this medicine? Take this medicine by mouth with a glass of water. Follow the directions on the prescription label. The instructions for use may differ based on the product and dose you are taking. Avoid taking this medicine in the evening. It may interfere with sleep. Take your doses at regular intervals. Do not take your medicine more often than directed. Talk to your pediatrician regarding the use of this medicine in children. While this drug may be prescribed for children 17 years or older for selected conditions, precautions do apply. Overdosage: If you think you have taken too much of this medicine contact a poison control center or emergency room at once. NOTE: This medicine is only for you. Do not share this medicine with others. What if I miss a dose? If you miss a dose, take it as soon as you can. If it is almost time for your next dose, take only that dose. Do not take double or extra doses. What may interact with this medicine? Do not take this medicine with any of the following medications: -duloxetine -MAOIs like Carbex, Eldepryl, Marplan, Nardil, and Parnate -medicines for colds or breathing difficulties like pseudoephedrine or phenylephrine -procarbazine -sibutramine -SSRIs  like citalopram, escitalopram, fluoxetine, fluvoxamine, paroxetine, and sertraline -stimulants like dexmethylphenidate, methylphenidate or modafinil -venlafaxine This medicine may also interact with the following medications: -medicines for diabetes This list may not describe all possible interactions. Give your health care provider a list of all the medicines, herbs, non-prescription drugs, or dietary supplements you use. Also tell them if you smoke, drink alcohol, or use illegal drugs. Some items may interact with your medicine. What should I watch for while using this medicine? Notify your physician immediately if you become short of breath while doing your normal activities. Do not take this medicine within 6 hours of bedtime. It can keep you from getting to sleep. Avoid drinks that contain caffeine and try to stick to a regular bedtime every night. This medicine was intended to be used in addition to a healthy diet and exercise. The best results are achieved this way. This medicine is only indicated for short-term use. Eventually your weight loss may level out. At that point, the drug will only help you maintain your new weight. Do not increase or in any way change your dose without consulting your doctor. You may get drowsy or dizzy. Do not drive, use machinery, or do anything that needs mental alertness until you know how this medicine affects you. Do not stand or sit up quickly, especially if you are an older patient. This reduces the risk of dizzy or  fainting spells. Alcohol may increase dizziness and drowsiness. Avoid alcoholic drinks. What side effects may I notice from receiving this medicine? Side effects that you should report to your doctor or health care professional as soon as possible: -chest pain, palpitations -depression or severe changes in mood -increased blood pressure -irritability -nervousness or restlessness -severe dizziness -shortness of breath -problems  urinating -unusual swelling of the legs -vomiting Side effects that usually do not require medical attention (report to your doctor or health care professional if they continue or are bothersome): -blurred vision or other eye problems -changes in sexual ability or desire -constipation or diarrhea -difficulty sleeping -dry mouth or unpleasant taste -headache -nausea This list may not describe all possible side effects. Call your doctor for medical advice about side effects. You may report side effects to FDA at 1-800-FDA-1088. Where should I keep my medicine? Keep out of the reach of children. This medicine can be abused. Keep your medicine in a safe place to protect it from theft. Do not share this medicine with anyone. Selling or giving away this medicine is dangerous and against the law. This medicine may cause accidental overdose and death if taken by other adults, children, or pets. Mix any unused medicine with a substance like cat litter or coffee grounds. Then throw the medicine away in a sealed container like a sealed bag or a coffee can with a lid. Do not use the medicine after the expiration date. Store at room temperature between 20 and 25 degrees C (68 and 77 degrees F). Keep container tightly closed. NOTE: This sheet is a summary. It may not cover all possible information. If you have questions about this medicine, talk to your doctor, pharmacist, or health care provider.  2018 Elsevier/Gold Standard (2015-05-29 12:53:15)

## 2018-04-05 NOTE — Progress Notes (Signed)
Pt presents for  Weight,B/P, B-12 injection. No side effects of medications-Phentermine or B-12. Weight loss__16__ lbs. Encourage eating healthy and exercise.

## 2018-04-30 ENCOUNTER — Encounter: Payer: Self-pay | Admitting: Certified Nurse Midwife

## 2018-04-30 ENCOUNTER — Ambulatory Visit: Payer: 59 | Admitting: Obstetrics and Gynecology

## 2018-04-30 ENCOUNTER — Ambulatory Visit (INDEPENDENT_AMBULATORY_CARE_PROVIDER_SITE_OTHER): Payer: 59 | Admitting: Certified Nurse Midwife

## 2018-04-30 VITALS — BP 110/78 | HR 74 | Ht 68.0 in | Wt 207.1 lb

## 2018-04-30 DIAGNOSIS — Z0130 Encounter for examination of blood pressure without abnormal findings: Secondary | ICD-10-CM

## 2018-04-30 DIAGNOSIS — Z7689 Persons encountering health services in other specified circumstances: Secondary | ICD-10-CM

## 2018-04-30 DIAGNOSIS — Z713 Dietary counseling and surveillance: Secondary | ICD-10-CM | POA: Diagnosis not present

## 2018-04-30 MED ORDER — CYANOCOBALAMIN 1000 MCG/ML IJ SOLN
1000.0000 ug | Freq: Once | INTRAMUSCULAR | Status: AC
  Administered 2018-04-30: 1000 ug via INTRAMUSCULAR

## 2018-04-30 NOTE — Progress Notes (Signed)
GYN ENCOUNTER NOTE  Subjective:       Jennifer Morse is a 27 y.o. [id number] female her for weight, blood pressure check, and b-12 injection.   No side effects of medications-Phentermine or B12. Denies difficulty breathing or respiratory distress, chest pain, abdominal pain, excessive vaginal bleeding, dysuria, and leg pain or swelling.    Gynecologic History  No LMP recorded (lmp unknown). (Menstrual status: IUD).  Contraception: IUD, Mirena  Last Pap: 03/02/2018. Results were: normal  Obstetric History  OB History  Gravida Para Term Preterm AB Living  2 2 2  0 0 1  SAB TAB Ectopic Multiple Live Births  0 0 0 0 1    # Outcome Date GA Lbr Len/2nd Weight Sex Delivery Anes PTL Lv  2 Term 07/08/17 [redacted]w[redacted]d 15:15 / 00:28 8 lb 2.9 oz (3.71 kg) M Vag-Spont EPI N LIV     Birth Comments: Dimpled cheek (right)  1 Term 07/01/15 [redacted]w[redacted]d 218:30 / 02:26 8 lb 2.9 oz (3.71 kg) F Vag-Spont EPI  LIV    Obstetric Comments  1st Menstrual Cycle:  15   1st Pregnancy:  24    Past Medical History:  Diagnosis Date  . Frequent headaches   . Gestational thrombocytopenia ([place])   . Hemorrhoids during pregnancy, antepartum   . Migraine with aura, intractable, with status migrainosus 1995  . Oligomenorrhea   . Postpartum depression    with first pregnancy    Past Surgical History:  Procedure Laterality Date  . WISDOM TOOTH EXTRACTION  2012    Current Outpatient Medications on File Prior to Visit  Medication Sig Dispense Refill  . aspirin-acetaminophen-caffeine (EXCEDRIN MIGRAINE) 250-250-65 MG tablet Take by mouth every 6 (six) hours as needed for headache.    . cyanocobalamin (,VITAMIN B-12,) 1000 MCG/ML injection Inject 1 mL (1,000 mcg total) into the muscle every 30 (thirty) days. 1 mL 2  . hydrocortisone-pramoxine (ANALPRAM-HC) 2.5-1 % rectal cream hydrocortisone-pramoxine 2.5 %-1 % rectal cream    . ipratropium (ATROVENT) 0.06 % nasal spray Place 2 sprays into both nostrils 4 (four) times  daily. 15 mL 0  . phentermine 37.5 MG capsule Take 1 capsule (37.5 mg total) by mouth every morning. 30 capsule 2  . ranitidine (ZANTAC) 150 MG tablet Take 150 mg by mouth at bedtime.    . SUMAtriptan (IMITREX) 50 MG tablet May repeat in 2 hours if headache persists or recurs. Do not use more than 200mg m/24 hour period 10 tablet 3  . SYNTHROID 25 MCG tablet TK 1 T PO D  5   No current facility-administered medications on file prior to visit.     No Known Allergies  Social History   Socioeconomic History  . Marital status: Married    Spouse name: Edison Nasuti  . Number of children: 2  . Years of education: Not on file  . Highest education level: Not on file  Occupational History  . Occupation: Therapist, sports  Social Needs  . Financial resource strain: Not on file  . Food insecurity:    Worry: Not on file    Inability: Not on file  . Transportation needs:    Medical: Not on file    Non-medical: Not on file  Tobacco Use  . Smoking status: Former Smoker    Packs/day: 0.25    Types: Cigarettes    Last attempt to quit: 02/26/2015    Years since quitting: 3.1  . Smokeless tobacco: Former Systems developer    Quit date: 2016  Substance and Sexual Activity  .  Alcohol use: No    Alcohol/week: 1.0 standard drinks    Types: 1 Glasses of wine per week  . Drug use: No  . Sexual activity: Yes    Partners: Male    Birth control/protection: None  Lifestyle  . Physical activity:    Days per week: Not on file    Minutes per session: Not on file  . Stress: Not on file  Relationships  . Social connections:    Talks on phone: Not on file    Gets together: Not on file    Attends religious service: Not on file    Active member of club or organization: Not on file    Attends meetings of clubs or organizations: Not on file    Relationship status: Not on file  . Intimate partner violence:    Fear of current or ex partner: Not on file    Emotionally abused: Not on file    Physically abused: Not on file    Forced  sexual activity: Not on file  Other Topics Concern  . Not on file  Social History Narrative  . Not on file    Family History  Problem Relation Age of Onset  . Diabetes Mother   . Diabetes Sister   . Diabetes Maternal Grandmother   . Heart disease Maternal Grandmother   . Hypertension Maternal Grandmother   . Kidney cancer Maternal Grandmother   . Diabetes Maternal Grandfather   . Heart disease Maternal Grandfather   . Hypertension Maternal Grandfather   . Diabetes Maternal Aunt     The following portions of the patient's history were reviewed and updated as appropriate: allergies, current medications, past family history, past medical history, past social history, past surgical history and problem list.  Review of Systems  ROS negative except as noted above. Information obtained from patient.   Objective:   BP 110/78   Pulse 74   Ht 5\' 8"  (1.727 m)   Wt 207 lb 1.6 oz (93.9 kg)   LMP  (LMP Unknown)   BMI 31.49 kg/m   GENERAL: Alert and oriented x 4, no apparent distress.   PHYSICAL EXAM: not indicated.   Assessment:   1. Blood pressure check  2. Encounter for weight management  Plan:   Weight loss: 4 pounds. Encouraged healthy eating and exercise.   Reviewed red flag symptoms and when to call.   RTC x 1 month for weight, blood pressure check and b-12 injection.    Diona Fanti, CNM [facility], [facility]  .

## 2018-04-30 NOTE — Patient Instructions (Signed)

## 2018-05-29 ENCOUNTER — Ambulatory Visit (INDEPENDENT_AMBULATORY_CARE_PROVIDER_SITE_OTHER): Payer: 59 | Admitting: Certified Nurse Midwife

## 2018-05-29 VITALS — BP 109/72 | HR 72 | Ht 68.0 in | Wt 204.5 lb

## 2018-05-29 DIAGNOSIS — Z713 Dietary counseling and surveillance: Secondary | ICD-10-CM | POA: Diagnosis not present

## 2018-05-29 DIAGNOSIS — Z23 Encounter for immunization: Secondary | ICD-10-CM

## 2018-05-29 DIAGNOSIS — R634 Abnormal weight loss: Secondary | ICD-10-CM

## 2018-05-29 DIAGNOSIS — T50905A Adverse effect of unspecified drugs, medicaments and biological substances, initial encounter: Principal | ICD-10-CM

## 2018-05-29 MED ORDER — CYANOCOBALAMIN 1000 MCG/ML IJ SOLN
1000.0000 ug | Freq: Once | INTRAMUSCULAR | Status: AC
  Administered 2018-05-29: 1000 ug via INTRAMUSCULAR

## 2018-05-29 NOTE — Progress Notes (Signed)
Pt presents for  Weight,B/P, B-12 injection. No side effects of medications-Phentermine or B-12. Weight loss __2.3__ lbs. Encourage eating healthy and exercise.   BP 109/72   Pulse 72   Ht 5\' 8"  (1.727 m)   Wt 204 lb 8 oz (92.8 kg)   LMP  (LMP Unknown)   BMI 31.09 kg/m

## 2018-05-31 ENCOUNTER — Encounter: Payer: Self-pay | Admitting: Certified Nurse Midwife

## 2018-05-31 MED ORDER — CYANOCOBALAMIN 1000 MCG/ML IJ SOLN: 1000.0000 ug | INTRAMUSCULAR | 2 refills | Status: DC

## 2018-05-31 MED ORDER — PHENTERMINE HCL 37.5 MG PO CAPS: 37.5000 mg | ORAL_CAPSULE | ORAL | 2 refills | Status: DC

## 2018-05-31 NOTE — Progress Notes (Signed)
Rx: Phentermine and B12, see orders.   I have reviewed the record and concur with patient management and plan of care.   Diona Fanti, CNM [facility], [facility]

## 2018-05-31 NOTE — Patient Instructions (Signed)

## 2018-06-14 ENCOUNTER — Other Ambulatory Visit: Payer: Self-pay | Admitting: Certified Nurse Midwife

## 2018-06-14 MED ORDER — AZITHROMYCIN 250 MG PO TABS: ORAL_TABLET | ORAL | 0 refills | Status: DC

## 2018-06-14 NOTE — Progress Notes (Signed)
Patient called with complaints of sinus congestion, teeth hurting, sinus pain, green nasal drainage. Has been using OTC Mucinex, Flonase, Claritin without relief. Called in Azithromycin to start if not improving by tomorrow. Dalia Heading, CNM

## 2018-06-14 NOTE — Progress Notes (Unsigned)
Contacted by patient with complaints of sinus congestion/ pain, green nasal discharge x 4-5 days that has been unresponsive to OTC decongestants, Flonase, etc. RX for Azithromycin   (Z-Pak) sent to pharmacy.

## 2018-06-26 ENCOUNTER — Ambulatory Visit: Payer: 59 | Admitting: Certified Nurse Midwife

## 2018-07-05 ENCOUNTER — Ambulatory Visit (INDEPENDENT_AMBULATORY_CARE_PROVIDER_SITE_OTHER): Payer: 59 | Admitting: Certified Nurse Midwife

## 2018-07-05 ENCOUNTER — Encounter: Payer: Self-pay | Admitting: Certified Nurse Midwife

## 2018-07-05 VITALS — BP 117/72 | HR 69 | Ht 68.0 in | Wt 203.5 lb

## 2018-07-05 DIAGNOSIS — Z6830 Body mass index (BMI) 30.0-30.9, adult: Secondary | ICD-10-CM | POA: Diagnosis not present

## 2018-07-05 DIAGNOSIS — Z0130 Encounter for examination of blood pressure without abnormal findings: Secondary | ICD-10-CM

## 2018-07-05 DIAGNOSIS — Z79899 Other long term (current) drug therapy: Secondary | ICD-10-CM

## 2018-07-05 DIAGNOSIS — Z713 Dietary counseling and surveillance: Secondary | ICD-10-CM | POA: Diagnosis not present

## 2018-07-05 MED ORDER — CYANOCOBALAMIN 1000 MCG/ML IJ SOLN
1000.0000 ug | Freq: Once | INTRAMUSCULAR | Status: AC
  Administered 2018-07-05: 1000 ug via INTRAMUSCULAR

## 2018-07-05 MED ORDER — CYANOCOBALAMIN 1000 MCG/ML IJ SOLN: 1000.0000 ug | INTRAMUSCULAR | 2 refills | Status: DC

## 2018-07-05 MED ORDER — PHENTERMINE HCL 37.5 MG PO CAPS: 37.5000 mg | ORAL_CAPSULE | ORAL | 2 refills | Status: DC

## 2018-07-05 NOTE — Progress Notes (Signed)
Pt presents for weight, B/P, B-12 injection. Waist circumference 39.5 inches.   BP 117/72   Pulse 69   Ht 5\' 8"  (1.727 m)   Wt 203 lb 8 oz (92.3 kg)   BMI 30.94 kg/m   No side effects of B-12.  Weight loss  1.3 lbs. Encouraged eating healthy and exercise.  Patient stopped phentermine over a month ago.   Rx: Phentermine and b12 injection, see orders.   Reviewed red flag symptoms and when to call.   RTC x 1 month for weight, blood pressure check and b-12 injection or sooner if needed.   Diona Fanti, CNM [facility], [facility]

## 2018-07-05 NOTE — Patient Instructions (Signed)

## 2018-07-31 ENCOUNTER — Encounter: Payer: Self-pay | Admitting: Physician Assistant

## 2018-07-31 ENCOUNTER — Ambulatory Visit: Payer: Self-pay | Admitting: Physician Assistant

## 2018-07-31 VITALS — BP 108/80 | HR 86 | Temp 98.2°F | Wt 201.0 lb

## 2018-07-31 DIAGNOSIS — J069 Acute upper respiratory infection, unspecified: Secondary | ICD-10-CM

## 2018-07-31 MED ORDER — AZELASTINE HCL 0.1 % NA SOLN: 1.0000 | Freq: Two times a day (BID) | NASAL | 12 refills | Status: DC

## 2018-07-31 MED ORDER — BENZONATATE 200 MG PO CAPS: 200.0000 mg | ORAL_CAPSULE | Freq: Three times a day (TID) | ORAL | 0 refills | Status: DC | PRN

## 2018-07-31 MED ORDER — PSEUDOEPH-BROMPHEN-DM 30-2-10 MG/5ML PO SYRP: 5.0000 mL | ORAL_SOLUTION | Freq: Four times a day (QID) | ORAL | 0 refills | Status: DC | PRN

## 2018-07-31 NOTE — Progress Notes (Signed)
Patient ID: Jennifer Morse DOB: 06-06-1991 AGE: 27 y.o. MRN: [id number]   PCP: Dalia Heading, CNM   Chief Complaint:  Chief Complaint  Patient presents with  . save-bodyache/sorethroat yellow mucusx 5d    sudafed robitussin, mucinex, dayquil      Subjective:    HPI:  Jennifer Morse is a 27 y.o. female presents for evaluation  Chief Complaint  Patient presents with  . save-bodyache/sorethroat yellow mucusx 5d    sudafed robitussin, mucinex, dayquil    27 year old female presents to [facility] with 5-day history of URI symptoms.  Began with raw/scratchy throat.  Then developed dry irritating cough.  2 days later developed coarse productive cough.  Patient reports nasal congestion, postnasal drip, chest congestion, frequent coarse cough, bodyaches and headache.  Reports nasal discharge and cough sputum is thick yellow/green. Has taken temperature at home; 83F-100F. Has been taking multiple over the counter medications including Sudafed, Robitussin, Mucinex, and Dayquil with no symptom relief. 40 year old son currently ill with similar symptoms. Denies fever, chills, ear pain, sinus pain, chest pain, SOB, wheezing, nausea/vomiting.  A complete, at least 10 system review of symptoms was performed, pertinent positives and negatives as mentioned in HPI, otherwise negative.  The following portions of the patient's history were reviewed and updated as appropriate: allergies, current medications and past medical history.  Patient Active Problem List   Diagnosis Date Noted  . BMI 34.0-34.9,adult 03/05/2018  . Surveillance of (intrauterine) contraceptive device 09/21/2017  . External thrombosed hemorrhoids 05/04/2017  . 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 Migraine 12/13/2013    No Known Allergies  Current Outpatient Medications on File Prior to Visit  Medication Sig Dispense Refill  . aspirin-acetaminophen-caffeine (EXCEDRIN MIGRAINE) 250-250-65 MG tablet Take  by mouth every 6 (six) hours as needed for headache.    . cyanocobalamin (,VITAMIN B-12,) 1000 MCG/ML injection Inject 1 mL (1,000 mcg total) into the muscle every 30 (thirty) days. 1 mL 2  . hydrocortisone-pramoxine (ANALPRAM-HC) 2.5-1 % rectal cream hydrocortisone-pramoxine 2.5 %-1 % rectal cream    . phentermine 37.5 MG capsule Take 1 capsule (37.5 mg total) by mouth every morning. 30 capsule 2  . ranitidine (ZANTAC) 150 MG tablet Take 150 mg by mouth at bedtime.    . SUMAtriptan (IMITREX) 50 MG tablet May repeat in 2 hours if headache persists or recurs. Do not use more than 200mg m/24 hour period 10 tablet 3  . SYNTHROID 25 MCG tablet TK 1 T PO D  5   No current facility-administered medications on file prior to visit.        Objective:   Vitals:   07/31/18 1044  BP: 108/80  Pulse: 86  Temp: 98.2 F (36.8 C)  SpO2: 98%     Wt Readings from Last 3 Encounters:  07/31/18 201 lb (91.2 kg)  07/05/18 203 lb 8 oz (92.3 kg)  05/29/18 204 lb 8 oz (92.8 kg)    Physical Exam:   General Appearance:  Alert, cooperative, appears stated age. In no acute distress. Afebrile.  Head:  Normocephalic, without obvious abnormality, atraumatic  Eyes:  PERRL, conjunctiva/corneas clear, EOM's intact, fundi benign, both eyes  Ears:  Normal TM's and external ear canals, both ears  Nose: Nares normal, septum midline. Nasal mucosa with mild bilateral edema and thin clear rhinorrhea. No tenderness with palpation over bilateral maxillary sinuses.  Throat: Lips, mucosa, and tongue normal; teeth and gums normal. Throat reveals no erythema. Tonsils with no enlargement or exudate.  Neck: Supple,  symmetrical, trachea midline, no palpable cervical lymphadenopathy  Lungs:   Clear to auscultation bilaterally, respirations unlabored. No wheezing. No rales. No rhonchi. Coarse/junky cough during examination.   Heart:  Regular rate and rhythm, S1 and S2 normal, no murmur, rub, or gallop  Extremities: Extremities  normal, atraumatic, no cyanosis or edema  Pulses: 2+ and symmetric  Skin: Skin color, texture, turgor normal, no rashes or lesions  Lymph nodes: Cervical, supraclavicular, and axillary nodes normal  Neurologic: Normal    Assessment & Plan:    Exam findings, diagnosis etiology and medication use and indications reviewed with patient. Follow-Up and discharge instructions provided. No emergent/urgent issues found on exam.  Patient education was provided.   Patient verbalized understanding of information provided and agrees with plan of care (POC), all questions answered. The patient is advised to call or return to clinic if condition does not see an improvement in symptoms, or to seek the care of the closest emergency department if condition worsens with the below plan.    1. Upper respiratory tract infection, unspecified type  - brompheniramine-pseudoephedrine-DM 30-2-10 MG/5ML syrup; Take 5 mLs by mouth 4 (four) times daily as needed.  Dispense: 120 mL; Refill: 0 - benzonatate (TESSALON) 200 MG capsule; Take 1 capsule (200 mg total) by mouth 3 (three) times daily as needed for cough.  Dispense: 20 capsule; Refill: 0 - azelastine (ASTELIN) 0.1 % nasal spray; Place 1 spray into both nostrils 2 (two) times daily. Use in each nostril as directed  Dispense: 30 mL; Refill: 12  Patient with 5-day history of URI symptoms.  Appears to be typical course for self-limited viral URI.  Patient generally healthy with no complicating factors.  Vital signs stable.  Prescribed Bromfed syrup and Tessalon Perles for cough.  Prescribed Astelin nasal spray for nasal congestion and postnasal drip.  Advised increasing fluids, rest, and gave patient work note for today and next 2 days of work (in case needed).  Advised patient return to clinic in 5 days if symptoms not improving, sooner with any worsening symptoms   Darlin Priestly, MHS, PA-C Montey Hora, MHS, PA-C Advanced Practice Provider [facility]  [address] [facility], [place],  [address] (p):  [phone] [email or web address]

## 2018-07-31 NOTE — Patient Instructions (Signed)
Thank you for choosing [facility] for your health care needs.  You have been diagnosed with an upper respiratory infection. Typically viral.  Recommend you increase fluids.  Rest. Take over the counter Tylenol or ibuprofen for pain/body aches.  Use prescription medications as prescribed.   - brompheniramine-pseudoephedrine-DM 30-2-10 MG/5ML syrup; Take 5 mLs by mouth 4 (four) times daily as needed.  Dispense: 120 mL; Refill: 0  - benzonatate (TESSALON) 200 MG capsule; Take 1 capsule (200 mg total) by mouth 3 (three) times daily as needed for cough.  Dispense: 20 capsule; Refill: 0  - azelastine (ASTELIN) 0.1 % nasal spray; Place 1 spray into both nostrils 2 (two) times daily. Use in each nostril as directed  Dispense: 30 mL; Refill: 12  Follow-up at [facility] in 5 days if symptoms not improving. Sooner with any worsening symptoms such as chest pain, shortness of breath, wheezing, etc.   Upper Respiratory Infection, Adult Most upper respiratory infections (URIs) are caused by a virus. A URI affects the nose, throat, and upper air passages. The most common type of URI is often called "the common cold." Follow these instructions at home:  Take medicines only as told by your doctor.  Gargle warm saltwater or take cough drops to comfort your throat as told by your doctor.  Use a warm mist humidifier or inhale steam from a shower to increase air moisture. This may make it easier to breathe.  Drink enough fluid to keep your pee (urine) clear or pale yellow.  Eat soups and other clear broths.  Have a healthy diet.  Rest as needed.  Go back to work when your fever is gone or your doctor says it is okay. ? You may need to stay home longer to avoid giving your URI to others. ? You can also wear a face mask and wash your hands often to prevent spread of the virus.  Use your inhaler more if you have asthma.  Do not use any tobacco products, including cigarettes, chewing tobacco, or  electronic cigarettes. If you need help quitting, ask your doctor. Contact a doctor if:  You are getting worse, not better.  Your symptoms are not helped by medicine.  You have chills.  You are getting more short of breath.  You have brown or red mucus.  You have yellow or brown discharge from your nose.  You have pain in your face, especially when you bend forward.  You have a fever.  You have puffy (swollen) neck glands.  You have pain while swallowing.  You have white areas in the back of your throat. Get help right away if:  You have very bad or constant: ? Headache. ? Ear pain. ? Pain in your forehead, behind your eyes, and over your cheekbones (sinus pain). ? Chest pain.  You have long-lasting (chronic) lung disease and any of the following: ? Wheezing. ? Long-lasting cough. ? Coughing up blood. ? A change in your usual mucus.  You have a stiff neck.  You have changes in your: ? Vision. ? Hearing. ? Thinking. ? Mood. This information is not intended to replace advice given to you by your health care provider. Make sure you discuss any questions you have with your health care provider. Document Released: 02/08/2008 Document Revised: 04/24/2016 Document Reviewed: 11/27/2013 Elsevier Interactive Patient Education  2018 [employer].

## 2018-08-06 ENCOUNTER — Ambulatory Visit (INDEPENDENT_AMBULATORY_CARE_PROVIDER_SITE_OTHER): Payer: 59 | Admitting: Certified Nurse Midwife

## 2018-08-06 ENCOUNTER — Telehealth: Payer: Self-pay | Admitting: Emergency Medicine

## 2018-08-06 ENCOUNTER — Encounter: Payer: Self-pay | Admitting: Certified Nurse Midwife

## 2018-08-06 VITALS — BP 107/73 | HR 72 | Ht 68.0 in | Wt 201.5 lb

## 2018-08-06 DIAGNOSIS — Z713 Dietary counseling and surveillance: Secondary | ICD-10-CM | POA: Diagnosis not present

## 2018-08-06 DIAGNOSIS — Z6829 Body mass index (BMI) 29.0-29.9, adult: Secondary | ICD-10-CM | POA: Insufficient documentation

## 2018-08-06 DIAGNOSIS — E663 Overweight: Secondary | ICD-10-CM | POA: Insufficient documentation

## 2018-08-06 DIAGNOSIS — T50905A Adverse effect of unspecified drugs, medicaments and biological substances, initial encounter: Principal | ICD-10-CM

## 2018-08-06 DIAGNOSIS — Z6830 Body mass index (BMI) 30.0-30.9, adult: Secondary | ICD-10-CM | POA: Insufficient documentation

## 2018-08-06 DIAGNOSIS — R634 Abnormal weight loss: Secondary | ICD-10-CM

## 2018-08-06 DIAGNOSIS — Z0130 Encounter for examination of blood pressure without abnormal findings: Secondary | ICD-10-CM

## 2018-08-06 DIAGNOSIS — E669 Obesity, unspecified: Secondary | ICD-10-CM | POA: Insufficient documentation

## 2018-08-06 MED ORDER — CYANOCOBALAMIN 1000 MCG/ML IJ SOLN
1000.0000 ug | Freq: Once | INTRAMUSCULAR | Status: AC
  Administered 2018-08-06: 1000 ug via INTRAMUSCULAR

## 2018-08-06 NOTE — Progress Notes (Signed)
Jennifer Morse presents for weight, B/P and B-12 injection. No side effects of medications-Phentermine or B-12.    Reports weight loss of 2 lbs 3oz. Had head cold so stop taking Phentermine 1 week ago, planning on restarting today.   Denies headache, blurred vision, difficulty breathing or respiratory distress, chest pain, abdominal pain, excessive vaginal bleeding, and leg pain or swelling.    Objective:  BP 107/73   Pulse 72   Ht 5\' 8"  (1.727 m)   Wt 201 lb 8 oz (91.4 kg)   BMI 30.64 kg/m    Waist circumference greater that 36 inches  Assessment:  Weight loss due to medication  Blood pressure check  B-12 injection  Plan:  Encouraged eating healthy and exercise.   Advised medication will be discontinue after obtaining BMI less than 30, pt verbalized understanding.   Reviewed red flag symptoms and when to call.   RTC x 1 month for weight, blood pressure check, and b-12 injection or sooner if needed.    Diona Fanti, CNM [facility], [facility]

## 2018-08-06 NOTE — Telephone Encounter (Signed)
Left message follow up call from visit with [facility]. 

## 2018-08-06 NOTE — Patient Instructions (Addendum)
Preventive Care 18-39 Years, Female Preventive care refers to lifestyle choices and visits with your health care provider that can promote health and wellness. What does preventive care include?  A yearly physical exam. This is also called an annual well check.  Dental exams once or twice a year.  Routine eye exams. Ask your health care provider how often you should have your eyes checked.  Personal lifestyle choices, including: ? Daily care of your teeth and gums. ? Regular physical activity. ? Eating a healthy diet. ? Avoiding tobacco and drug use. ? Limiting alcohol use. ? Practicing safe sex. ? Taking vitamin and mineral supplements as recommended by your health care provider. What happens during an annual well check? The services and screenings done by your health care provider during your annual well check will depend on your age, overall health, lifestyle risk factors, and family history of disease. Counseling Your health care provider may ask you questions about your:  Alcohol use.  Tobacco use.  Drug use.  Emotional well-being.  Home and relationship well-being.  Sexual activity.  Eating habits.  Work and work Statistician.  Method of birth control.  Menstrual cycle.  Pregnancy history.  Screening You may have the following tests or measurements:  Height, weight, and BMI.  Diabetes screening. This is done by checking your blood sugar (glucose) after you have not eaten for a while (fasting).  Blood pressure.  Lipid and cholesterol levels. These may be checked every 5 years starting at age 66.  Skin check.  Hepatitis C blood test.  Hepatitis B blood test.  Sexually transmitted disease (STD) testing.  BRCA-related cancer screening. This may be done if you have a family history of breast, ovarian, tubal, or peritoneal cancers.  Pelvic exam and Pap test. This may be done every 3 years starting at age 40. Starting at age 59, this may be done every 5  years if you have a Pap test in combination with an HPV test.  Discuss your test results, treatment options, and if necessary, the need for more tests with your health care provider. Vaccines Your health care provider may recommend certain vaccines, such as:  Influenza vaccine. This is recommended every year.  Tetanus, diphtheria, and acellular pertussis (Tdap, Td) vaccine. You may need a Td booster every 10 years.  Varicella vaccine. You may need this if you have not been vaccinated.  HPV vaccine. If you are 69 or younger, you may need three doses over 6 months.  Measles, mumps, and rubella (MMR) vaccine. You may need at least one dose of MMR. You may also need a second dose.  Pneumococcal 13-valent conjugate (PCV13) vaccine. You may need this if you have certain conditions and were not previously vaccinated.  Pneumococcal polysaccharide (PPSV23) vaccine. You may need one or two doses if you smoke cigarettes or if you have certain conditions.  Meningococcal vaccine. One dose is recommended if you are age 27-21 years and a first-year college student living in a residence hall, or if you have one of several medical conditions. You may also need additional booster doses.  Hepatitis A vaccine. You may need this if you have certain conditions or if you travel or work in places where you may be exposed to hepatitis A.  Hepatitis B vaccine. You may need this if you have certain conditions or if you travel or work in places where you may be exposed to hepatitis B.  Haemophilus influenzae type b (Hib) vaccine. You may need this if  you have certain risk factors.  Talk to your health care provider about which screenings and vaccines you need and how often you need them. This information is not intended to replace advice given to you by your health care provider. Make sure you discuss any questions you have with your health care provider. Document Released: 10/18/2001 Document Revised: 05/11/2016  Document Reviewed: 06/23/2015 Elsevier Interactive Patient Education  2018 [employer] Phentermine tablets or capsules What is this medicine? PHENTERMINE (FEN ter meen) decreases your appetite. It is used with a reduced calorie diet and exercise to help you lose weight. This medicine may be used for other purposes; ask your health care provider or pharmacist if you have questions. COMMON BRAND NAME(S): Adipex-P, Atti-Plex P, Atti-Plex P Spansule, Fastin, Lomaira, Pro-Fast, Tara-8 What should I tell my health care provider before I take this medicine? They need to know if you have any of these conditions: -agitation -glaucoma -heart disease -high blood pressure -history of substance abuse -lung disease called Primary Pulmonary Hypertension (PPH) -taken an MAOI like Carbex, Eldepryl, Marplan, Nardil, or Parnate in last 14 days -thyroid disease -an unusual or allergic reaction to phentermine, other medicines, foods, dyes, or preservatives -pregnant or trying to get pregnant -breast-feeding How should I use this medicine? Take this medicine by mouth with a glass of water. Follow the directions on the prescription label. The instructions for use may differ based on the product and dose you are taking. Avoid taking this medicine in the evening. It may interfere with sleep. Take your doses at regular intervals. Do not take your medicine more often than directed. Talk to your pediatrician regarding the use of this medicine in children. While this drug may be prescribed for children 17 years or older for selected conditions, precautions do apply. Overdosage: If you think you have taken too much of this medicine contact a poison control center or emergency room at once. NOTE: This medicine is only for you. Do not share this medicine with others. What if I miss a dose? If you miss a dose, take it as soon as you can. If it is almost time for your next dose, take only that dose. Do not take double or  extra doses. What may interact with this medicine? Do not take this medicine with any of the following medications: -duloxetine -MAOIs like Carbex, Eldepryl, Marplan, Nardil, and Parnate -medicines for colds or breathing difficulties like pseudoephedrine or phenylephrine -procarbazine -sibutramine -SSRIs like citalopram, escitalopram, fluoxetine, fluvoxamine, paroxetine, and sertraline -stimulants like dexmethylphenidate, methylphenidate or modafinil -venlafaxine This medicine may also interact with the following medications: -medicines for diabetes This list may not describe all possible interactions. Give your health care provider a list of all the medicines, herbs, non-prescription drugs, or dietary supplements you use. Also tell them if you smoke, drink alcohol, or use illegal drugs. Some items may interact with your medicine. What should I watch for while using this medicine? Notify your physician immediately if you become short of breath while doing your normal activities. Do not take this medicine within 6 hours of bedtime. It can keep you from getting to sleep. Avoid drinks that contain caffeine and try to stick to a regular bedtime every night. This medicine was intended to be used in addition to a healthy diet and exercise. The best results are achieved this way. This medicine is only indicated for short-term use. Eventually your weight loss may level out. At that point, the drug will only help you maintain your new   weight. Do not increase or in any way change your dose without consulting your doctor. You may get drowsy or dizzy. Do not drive, use machinery, or do anything that needs mental alertness until you know how this medicine affects you. Do not stand or sit up quickly, especially if you are an older patient. This reduces the risk of dizzy or fainting spells. Alcohol may increase dizziness and drowsiness. Avoid alcoholic drinks. What side effects may I notice from receiving this  medicine? Side effects that you should report to your doctor or health care professional as soon as possible: -chest pain, palpitations -depression or severe changes in mood -increased blood pressure -irritability -nervousness or restlessness -severe dizziness -shortness of breath -problems urinating -unusual swelling of the legs -vomiting Side effects that usually do not require medical attention (report to your doctor or health care professional if they continue or are bothersome): -blurred vision or other eye problems -changes in sexual ability or desire -constipation or diarrhea -difficulty sleeping -dry mouth or unpleasant taste -headache -nausea This list may not describe all possible side effects. Call your doctor for medical advice about side effects. You may report side effects to FDA at 1-800-FDA-1088. Where should I keep my medicine? Keep out of the reach of children. This medicine can be abused. Keep your medicine in a safe place to protect it from theft. Do not share this medicine with anyone. Selling or giving away this medicine is dangerous and against the law. This medicine may cause accidental overdose and death if taken by other adults, children, or pets. Mix any unused medicine with a substance like cat litter or coffee grounds. Then throw the medicine away in a sealed container like a sealed bag or a coffee can with a lid. Do not use the medicine after the expiration date. Store at room temperature between 20 and 25 degrees C (68 and 77 degrees F). Keep container tightly closed. NOTE: This sheet is a summary. It may not cover all possible information. If you have questions about this medicine, talk to your doctor, pharmacist, or health care provider.  2018 Elsevier/Gold Standard (2015-05-29 12:53:15)  

## 2018-09-06 ENCOUNTER — Ambulatory Visit (INDEPENDENT_AMBULATORY_CARE_PROVIDER_SITE_OTHER): Payer: 59 | Admitting: Certified Nurse Midwife

## 2018-09-06 ENCOUNTER — Encounter: Payer: Self-pay | Admitting: Certified Nurse Midwife

## 2018-09-06 VITALS — BP 110/71 | HR 72 | Ht 68.0 in | Wt 202.6 lb

## 2018-09-06 DIAGNOSIS — Z713 Dietary counseling and surveillance: Secondary | ICD-10-CM

## 2018-09-06 DIAGNOSIS — Z789 Other specified health status: Secondary | ICD-10-CM

## 2018-09-06 DIAGNOSIS — Z0130 Encounter for examination of blood pressure without abnormal findings: Secondary | ICD-10-CM | POA: Diagnosis not present

## 2018-09-06 MED ORDER — CYANOCOBALAMIN 1000 MCG/ML IJ SOLN
1000.0000 ug | Freq: Once | INTRAMUSCULAR | Status: AC
  Administered 2018-09-06: 1000 ug via INTRAMUSCULAR

## 2018-09-06 NOTE — Progress Notes (Signed)
Jennifer Morse presents for weight, B/P and B-12 injection. No side effects of medications-Phentermine or B-12. Weight gain 1 lbs. Encouraged eating healthy and exercise.

## 2018-09-06 NOTE — Patient Instructions (Signed)
Exercising to Lose Weight Exercise is structured, repetitive physical activity to improve fitness and health. Getting regular exercise is important for everyone. It is especially important if you are overweight. Being overweight increases your risk of heart disease, stroke, diabetes, high blood pressure, and several types of cancer. Reducing your calorie intake and exercising can help you lose weight. Exercise is usually categorized as moderate or vigorous intensity. To lose weight, most people need to do a certain amount of moderate-intensity or vigorous-intensity exercise each week. Moderate-intensity exercise  Moderate-intensity exercise is any activity that gets you moving enough to burn at least three times more energy (calories) than if you were sitting. Examples of moderate exercise include:  Walking a mile in 15 minutes.  Doing light yard work.  Biking at an easy pace. Most people should get at least 150 minutes (2 hours and 30 minutes) a week of moderate-intensity exercise to maintain their body weight. Vigorous-intensity exercise Vigorous-intensity exercise is any activity that gets you moving enough to burn at least six times more calories than if you were sitting. When you exercise at this intensity, you should be working hard enough that you are not able to carry on a conversation. Examples of vigorous exercise include:  Running.  Playing a team sport, such as football, basketball, and soccer.  Jumping rope. Most people should get at least 75 minutes (1 hour and 15 minutes) a week of vigorous-intensity exercise to maintain their body weight. How can exercise affect me? When you exercise enough to burn more calories than you eat, you lose weight. Exercise also reduces body fat and builds muscle. The more muscle you have, the more calories you burn. Exercise also:  Improves mood.  Reduces stress and tension.  Improves your overall fitness, flexibility, and  endurance.  Increases bone strength. The amount of exercise you need to lose weight depends on:  Your age.  The type of exercise.  Any health conditions you have.  Your overall physical ability. Talk to your health care provider about how much exercise you need and what types of activities are safe for you. What actions can I take to lose weight? Nutrition   Make changes to your diet as told by your health care provider or diet and nutrition specialist (dietitian). This may include: ? Eating fewer calories. ? Eating more protein. ? Eating less unhealthy fats. ? Eating a diet that includes fresh fruits and vegetables, whole grains, low-fat dairy products, and lean protein. ? Avoiding foods with added fat, salt, and sugar.  Drink plenty of water while you exercise to prevent dehydration or heat stroke. Activity  Choose an activity that you enjoy and set realistic goals. Your health care provider can help you make an exercise plan that works for you.  Exercise at a moderate or vigorous intensity most days of the week. ? The intensity of exercise may vary from person to person. You can tell how intense a workout is for you by paying attention to your breathing and heartbeat. Most people will notice their breathing and heartbeat get faster with more intense exercise.  Do resistance training twice each week, such as: ? Push-ups. ? Sit-ups. ? Lifting weights. ? Using resistance bands.  Getting short amounts of exercise can be just as helpful as long structured periods of exercise. If you have trouble finding time to exercise, try to include exercise in your daily routine. ? Get up, stretch, and walk around every 30 minutes throughout the day. ? Go for a   Getting short amounts of exercise can be just as helpful as long structured periods of exercise. If you have trouble finding time to exercise, try to include exercise in your daily routine.  ? Get up, stretch, and walk around every 30 minutes throughout the day.  ? Go for a walk during your lunch break.  ? Park your car farther away from your destination.  ? If you take public transportation, get off one stop early and walk the rest of the way.  ? Make phone calls while standing up and walking  around.  ? Take the stairs instead of elevators or escalators.   Wear comfortable clothes and shoes with good support.   Do not exercise so much that you hurt yourself, feel dizzy, or get very short of breath.  Where to find more information   [place] Department of Health and Human Services: www.hhs.gov   Centers for Disease Control and Prevention (CDC): www.cdc.gov  Contact a health care provider:   Before starting a new exercise program.   If you have questions or concerns about your weight.   If you have a medical problem that keeps you from exercising.  Get help right away if you have any of the following while exercising:   Injury.   Dizziness.   Difficulty breathing or shortness of breath that does not go away when you stop exercising.   Chest pain.   Rapid heartbeat.  Summary   Being overweight increases your risk of heart disease, stroke, diabetes, high blood pressure, and several types of cancer.   Losing weight happens when you burn more calories than you eat.   Reducing the amount of calories you eat in addition to getting regular moderate or vigorous exercise each week helps you lose weight.  This information is not intended to replace advice given to you by your health care provider. Make sure you discuss any questions you have with your health care provider.  Document Released: 09/24/2010 Document Revised: 09/04/2017 Document Reviewed: 09/04/2017  Elsevier Interactive Patient Education  2019 [employer]  Calorie Counting for Weight Loss  Calories are units of energy. Your body needs a certain amount of calories from food to keep you going throughout the day. When you eat more calories than your body needs, your body stores the extra calories as fat. When you eat fewer calories than your body needs, your body burns fat to get the energy it needs.  Calorie counting means keeping track of how many calories you eat and drink each day. Calorie counting can be helpful if you need to lose  weight. If you make sure to eat fewer calories than your body needs, you should lose weight. Ask your health care provider what a healthy weight is for you.  For calorie counting to work, you will need to eat the right number of calories in a day in order to lose a healthy amount of weight per week. A dietitian can help you determine how many calories you need in a day and will give you suggestions on how to reach your calorie goal.   A healthy amount of weight to lose per week is usually 1-2 lb (0.5-0.9 kg). This usually means that your daily calorie intake should be reduced by 500-750 calories.   Eating 1,200 - 1,500 calories per day can help most women lose weight.   Eating 1,500 - 1,800 calories per day can help most men lose weight.  What is my plan?  My goal is   to have __________ calories per day.  If I have this many calories per day, I should lose around __________ pounds per week.  What do I need to know about calorie counting?  In order to meet your daily calorie goal, you will need to:   Find out how many calories are in each food you would like to eat. Try to do this before you eat.   Decide how much of the food you plan to eat.   Write down what you ate and how many calories it had. Doing this is called keeping a food log.  To successfully lose weight, it is important to balance calorie counting with a healthy lifestyle that includes regular activity. Aim for 150 minutes of moderate exercise (such as walking) or 75 minutes of vigorous exercise (such as running) each week.  Where do I find calorie information?    The number of calories in a food can be found on a Nutrition Facts label. If a food does not have a Nutrition Facts label, try to look up the calories online or ask your dietitian for help.  Remember that calories are listed per serving. If you choose to have more than one serving of a food, you will have to multiply the calories per serving by the amount of servings you plan to eat. For  example, the label on a package of bread might say that a serving size is 1 slice and that there are 90 calories in a serving. If you eat 1 slice, you will have eaten 90 calories. If you eat 2 slices, you will have eaten 180 calories.  How do I keep a food log?  Immediately after each meal, record the following information in your food log:   What you ate. Don't forget to include toppings, sauces, and other extras on the food.   How much you ate. This can be measured in cups, ounces, or number of items.   How many calories each food and drink had.   The total number of calories in the meal.  Keep your food log near you, such as in a small notebook in your pocket, or use a mobile app or website. Some programs will calculate calories for you and show you how many calories you have left for the day to meet your goal.  What are some calorie counting tips?     Use your calories on foods and drinks that will fill you up and not leave you hungry:  ? Some examples of foods that fill you up are nuts and nut butters, vegetables, lean proteins, and high-fiber foods like whole grains. High-fiber foods are foods with more than 5 g fiber per serving.  ? Drinks such as sodas, specialty coffee drinks, alcohol, and juices have a lot of calories, yet do not fill you up.   Eat nutritious foods and avoid empty calories. Empty calories are calories you get from foods or beverages that do not have many vitamins or protein, such as candy, sweets, and soda. It is better to have a nutritious high-calorie food (such as an avocado) than a food with few nutrients (such as a bag of chips).   Know how many calories are in the foods you eat most often. This will help you calculate calorie counts faster.   Pay attention to calories in drinks. Low-calorie drinks include water and unsweetened drinks.   Pay attention to nutrition labels for "low fat" or "fat free" foods. These foods sometimes have   the same amount of calories or more calories  than the full fat versions. They also often have added sugar, starch, or salt, to make up for flavor that was removed with the fat.   Find a way of tracking calories that works for you. Get creative. Try different apps or programs if writing down calories does not work for you.  What are some portion control tips?   Know how many calories are in a serving. This will help you know how many servings of a certain food you can have.   Use a measuring cup to measure serving sizes. You could also try weighing out portions on a kitchen scale. With time, you will be able to estimate serving sizes for some foods.   Take some time to put servings of different foods on your favorite plates, bowls, and cups so you know what a serving looks like.   Try not to eat straight from a bag or box. Doing this can lead to overeating. Put the amount you would like to eat in a cup or on a plate to make sure you are eating the right portion.   Use smaller plates, glasses, and bowls to prevent overeating.   Try not to multitask (for example, watch TV or use your computer) while eating. If it is time to eat, sit down at a table and enjoy your food. This will help you to know when you are full. It will also help you to be aware of what you are eating and how much you are eating.  What are tips for following this plan?  Reading food labels   Check the calorie count compared to the serving size. The serving size may be smaller than what you are used to eating.   Check the source of the calories. Make sure the food you are eating is high in vitamins and protein and low in saturated and trans fats.  Shopping   Read nutrition labels while you shop. This will help you make healthy decisions before you decide to purchase your food.   Make a grocery list and stick to it.  Cooking   Try to cook your favorite foods in a healthier way. For example, try baking instead of frying.   Use low-fat dairy products.  Meal planning   Use more fruits  and vegetables. Half of your plate should be fruits and vegetables.   Include lean proteins like poultry and fish.  How do I count calories when eating out?   Ask for smaller portion sizes.   Consider sharing an entree and sides instead of getting your own entree.   If you get your own entree, eat only half. Ask for a box at the beginning of your meal and put the rest of your entree in it so you are not tempted to eat it.   If calories are listed on the menu, choose the lower calorie options.   Choose dishes that include vegetables, fruits, whole grains, low-fat dairy products, and lean protein.   Choose items that are boiled, broiled, grilled, or steamed. Stay away from items that are buttered, battered, fried, or served with cream sauce. Items labeled "crispy" are usually fried, unless stated otherwise.   Choose water, low-fat milk, unsweetened iced tea, or other drinks without added sugar. If you want an alcoholic beverage, choose a lower calorie option such as a glass of wine or light beer.   Ask for dressings, sauces, and syrups on the side. These   2 Tbsp-1 ping-pong ball. ?  cup- baseball. ? 1 cup-1 baseball. Summary  Calorie counting means keeping track of how many calories you eat  and drink each day. If you eat fewer calories than your body needs, you should lose weight.  A healthy amount of weight to lose per week is usually 1-2 lb (0.5-0.9 kg). This usually means reducing your daily calorie intake by 500-750 calories.  The number of calories in a food can be found on a Nutrition Facts label. If a food does not have a Nutrition Facts label, try to look up the calories online or ask your dietitian for help.  Use your calories on foods and drinks that will fill you up, and not on foods and drinks that will leave you hungry.  Use smaller plates, glasses, and bowls to prevent overeating. This information is not intended to replace advice given to you by your health care provider. Make sure you discuss any questions you have with your health care provider. Document Released: 08/22/2005 Document Revised: 05/11/2018 Document Reviewed: 07/22/2016 Elsevier Interactive Patient Education  2019 [place]. Phentermine tablets or capsules What is this medicine? PHENTERMINE (FEN ter meen) decreases your appetite. It is used with a reduced calorie diet and exercise to help you lose weight. This medicine may be used for other purposes; ask your health care provider or pharmacist if you have questions. COMMON BRAND NAME(S): Adipex-P, Atti-Plex P, Atti-Plex P Spansule, Fastin, Lomaira, Pro-Fast, Tara-8 What should I tell my health care provider before I take this medicine? They need to know if you have any of these conditions: -agitation or nervousness -diabetes -glaucoma -heart disease -high blood pressure -history of drug abuse or addiction -history of stroke -kidney disease -lung disease called Primary Pulmonary Hypertension (PPH) -taken an MAOI like Carbex, Eldepryl, Marplan, Nardil, or Parnate in last 14 days -taking stimulant medicines for attention disorders, weight loss, or to stay awake -thyroid disease -an unusual or allergic reaction to phentermine, other  medicines, foods, dyes, or preservatives -pregnant or trying to get pregnant -breast-feeding How should I use this medicine? Take this medicine by mouth with a glass of water. Follow the directions on the prescription label. The instructions for use may differ based on the product and dose you are taking. Avoid taking this medicine in the evening. It may interfere with sleep. Take your doses at regular intervals. Do not take your medicine more often than directed. Talk to your pediatrician regarding the use of this medicine in children. While this drug may be prescribed for children 17 years or older for selected conditions, precautions do apply. Overdosage: If you think you have taken too much of this medicine contact a poison control center or emergency room at once. NOTE: This medicine is only for you. Do not share this medicine with others. What if I miss a dose? If you miss a dose, take it as soon as you can. If it is almost time for your next dose, take only that dose. Do not take double or extra doses. What may interact with this medicine? Do not take this medicine with any of the following medications: -MAOIs like Carbex, Eldepryl, Marplan, Nardil, and Parnate -medicines for colds or breathing difficulties like pseudoephedrine or phenylephrine -procarbazine -sibutramine -stimulant medicines for attention disorders, weight loss, or to stay awake This medicine may also interact with the following medications: -certain medicines for depression, anxiety, or psychotic disturbances -linezolid -medicines for diabetes -medicines for high blood pressure This list may not describe  all possible interactions. Give your health care provider a list of all the medicines, herbs, non-prescription drugs, or dietary supplements you use. Also tell them if you smoke, drink alcohol, or use illegal drugs. Some items may interact with your medicine. What should I watch for while using this medicine? Notify  your physician immediately if you become short of breath while doing your normal activities. Do not take this medicine within 6 hours of bedtime. It can keep you from getting to sleep. Avoid drinks that contain caffeine and try to stick to a regular bedtime every night. This medicine was intended to be used in addition to a healthy diet and exercise. The best results are achieved this way. This medicine is only indicated for short-term use. Eventually your weight loss may level out. At that point, the drug will only help you maintain your new weight. Do not increase or in any way change your dose without consulting your doctor. You may get drowsy or dizzy. Do not drive, use machinery, or do anything that needs mental alertness until you know how this medicine affects you. Do not stand or sit up quickly, especially if you are an older patient. This reduces the risk of dizzy or fainting spells. Alcohol may increase dizziness and drowsiness. Avoid alcoholic drinks. What side effects may I notice from receiving this medicine? Side effects that you should report to your doctor or health care professional as soon as possible: -allergic reactions like skin rash, itching or hives, swelling of the face, lips, or tongue) -anxiety -breathing problems -changes in vision -chest pain or chest tightness -depressed mood or other mood changes -hallucinations, loss of contact with reality -fast, irregular heartbeat -increased blood pressure -irritable -nervousness or restlessness -painful urination -palpitations -tremors -trouble sleeping -seizures -signs and symptoms of a stroke like changes in vision; confusion; trouble speaking or understanding; severe headaches; sudden numbness or weakness of the face, arm or leg; trouble walking; dizziness; loss of balance or coordination -unusually weak or tired -vomiting Side effects that usually do not require medical attention (report to your doctor or health care  professional if they continue or are bothersome): -constipation or diarrhea -dry mouth -headache -nausea -stomach upset -sweating This list may not describe all possible side effects. Call your doctor for medical advice about side effects. You may report side effects to FDA at 1-800-FDA-1088. Where should I keep my medicine? Keep out of the reach of children. This medicine can be abused. Keep your medicine in a safe place to protect it from theft. Do not share this medicine with anyone. Selling or giving away this medicine is dangerous and against the law. This medicine may cause accidental overdose and death if taken by other adults, children, or pets. Mix any unused medicine with a substance like cat litter or coffee grounds. Then throw the medicine away in a sealed container like a sealed bag or a coffee can with a lid. Do not use the medicine after the expiration date. Store at room temperature between 20 and 25 degrees C (68 and 77 degrees F). Keep container tightly closed. NOTE: This sheet is a summary. It may not cover all possible information. If you have questions about this medicine, talk to your doctor, pharmacist, or health care provider.  2019 Elsevier/[employer] (2017-02-03 08:23:13)

## 2018-09-06 NOTE — Progress Notes (Signed)
Jennifer Morse presents for weight, B/P and B-12 injection. No side effects of medications-Phentermine or B-12.    Reports one (1) pound weight gain due to the holiday season.   Denies headache, blurred vision, difficulty breathing or respiratory distress, chest pain, abdominal pain, excessive vaginal bleeding, and leg pain or swelling.    Objective:  BP 110/71   Pulse 72   Ht 5\' 8"  (1.727 m)   Wt 202 lb 9.6 oz (91.9 kg)   BMI 30.81 kg/m    Waist circumference greater that 36 inches  Assessment:  Weight loss advised  Blood pressure check  B-12 injection  Plan:  Encouraged eating healthy and exercise.   Advised medication will be discontinue after obtaining BMI less than 30 or with continues weight gain, pt verbalized understanding.   Reviewed red flag symptoms and when to call.   RTC x 1 month for weight, blood pressure check, and b-12 injection or sooner if needed.    Diona Fanti, CNM [facility], [facility]

## 2018-10-09 ENCOUNTER — Encounter: Payer: 59 | Admitting: Certified Nurse Midwife

## 2018-12-18 ENCOUNTER — Other Ambulatory Visit: Payer: Self-pay | Admitting: Certified Nurse Midwife

## 2018-12-18 DIAGNOSIS — E042 Nontoxic multinodular goiter: Secondary | ICD-10-CM

## 2018-12-18 MED ORDER — SYNTHROID 25 MCG PO TABS: ORAL_TABLET | ORAL | 5 refills | Status: DC

## 2018-12-19 ENCOUNTER — Other Ambulatory Visit: Payer: Self-pay

## 2018-12-19 ENCOUNTER — Other Ambulatory Visit: Payer: 59

## 2018-12-19 DIAGNOSIS — E042 Nontoxic multinodular goiter: Secondary | ICD-10-CM | POA: Diagnosis not present

## 2018-12-20 LAB — TSH+FREE T4
Free T4: 1.16 ng/dL (ref 0.82–1.77)
TSH: 1.84 u[IU]/mL (ref 0.450–4.500)

## 2019-11-04 ENCOUNTER — Ambulatory Visit (INDEPENDENT_AMBULATORY_CARE_PROVIDER_SITE_OTHER): Payer: 59 | Admitting: Certified Nurse Midwife

## 2019-11-04 ENCOUNTER — Other Ambulatory Visit: Payer: Self-pay

## 2019-11-04 ENCOUNTER — Encounter: Payer: Self-pay | Admitting: Certified Nurse Midwife

## 2019-11-04 VITALS — BP 93/66 | HR 67 | Ht 68.0 in | Wt 227.6 lb

## 2019-11-04 DIAGNOSIS — Z1331 Encounter for screening for depression: Secondary | ICD-10-CM | POA: Diagnosis not present

## 2019-11-04 DIAGNOSIS — E669 Obesity, unspecified: Secondary | ICD-10-CM

## 2019-11-04 DIAGNOSIS — D519 Vitamin B12 deficiency anemia, unspecified: Secondary | ICD-10-CM | POA: Diagnosis not present

## 2019-11-04 DIAGNOSIS — F419 Anxiety disorder, unspecified: Secondary | ICD-10-CM

## 2019-11-04 DIAGNOSIS — Z975 Presence of (intrauterine) contraceptive device: Secondary | ICD-10-CM

## 2019-11-04 DIAGNOSIS — Z7689 Persons encountering health services in other specified circumstances: Secondary | ICD-10-CM

## 2019-11-04 DIAGNOSIS — Z6834 Body mass index (BMI) 34.0-34.9, adult: Secondary | ICD-10-CM

## 2019-11-04 MED ORDER — CYANOCOBALAMIN 1000 MCG/ML IJ SOLN
1000.0000 ug | Freq: Once | INTRAMUSCULAR | Status: AC
  Administered 2019-11-04: 1000 ug via INTRAMUSCULAR

## 2019-11-04 MED ORDER — CYANOCOBALAMIN 1000 MCG/ML IJ SOLN: 1000.0000 ug | Freq: Once | INTRAMUSCULAR | 1 refills | Status: AC

## 2019-11-04 MED ORDER — PHENTERMINE HCL 37.5 MG PO CAPS: 37.5000 mg | ORAL_CAPSULE | ORAL | 2 refills | Status: DC

## 2019-11-04 MED ORDER — BUPROPION HCL ER (XL) 150 MG PO TB24: 150.0000 mg | ORAL_TABLET | Freq: Every day | ORAL | 1 refills | Status: DC

## 2019-11-04 NOTE — Progress Notes (Signed)
GYN ENCOUNTER NOTE  Subjective:       Jennifer Morse is a 29 y.o. [id number] female is here for evaluation of the following issues:  1. Anxiety 2. Desires medical weight loss  Denies difficulty breathing or respiratory distress, chest pain, abdominal pain, dysuria and leg pain or swelling.    Gynecologic History  No LMP recorded (lmp unknown). (Menstrual status: IUD).  Contraception: IUD, Mirena  Last Pap: 02/2018. Results were: normal  Obstetric History  OB History  Gravida Para Term Preterm AB Living  2 2 2  0 0 1  SAB TAB Ectopic Multiple Live Births  0 0 0 0 1    # Outcome Date GA Lbr Len/2nd Weight Sex Delivery Anes PTL Lv  2 Term 07/08/17 [redacted]w[redacted]d 15:15 / 00:28 8 lb 2.9 oz (3.71 kg) M Vag-Spont EPI N LIV     Birth Comments: Dimpled cheek (right)  1 Term 07/01/15 [redacted]w[redacted]d 218:30 / 02:26 8 lb 2.9 oz (3.71 kg) F Vag-Spont EPI  LIV    Obstetric Comments  1st Menstrual Cycle:  15   1st Pregnancy:  24    Past Medical History:  Diagnosis Date  . Anxiety   . Frequent headaches   . Gestational thrombocytopenia ([place])   . Migraine with aura, intractable, with status migrainosus 1995  . Oligomenorrhea   . Postpartum depression    with first pregnancy    Past Surgical History:  Procedure Laterality Date  . WISDOM TOOTH EXTRACTION  2012    Current Outpatient Medications on File Prior to Visit  Medication Sig Dispense Refill  . aspirin-acetaminophen-caffeine (EXCEDRIN MIGRAINE) 250-250-65 MG tablet Take by mouth every 6 (six) hours as needed for headache.    Marland Kitchen azelastine (ASTELIN) 0.1 % nasal spray Place 1 spray into both nostrils 2 (two) times daily. Use in each nostril as directed (Patient taking differently: Place 1 spray into both nostrils as needed. Use in each nostril as directed) 30 mL 12  . SYNTHROID 25 MCG tablet Take one tab daily 30 tablet 5   No current facility-administered medications on file prior to visit.    No Known Allergies  Social History    Socioeconomic History  . Marital status: Married    Spouse name: Edison Nasuti  . Number of children: 2  . Years of education: Not on file  . Highest education level: Not on file  Occupational History  . Occupation: Therapist, sports  Tobacco Use  . Smoking status: Former Smoker    Packs/day: 0.25    Types: Cigarettes    Quit date: 02/26/2015    Years since quitting: 4.6  . Smokeless tobacco: Never Used  Substance and Sexual Activity  . Alcohol use: Yes    Alcohol/week: 1.0 standard drinks    Types: 1 Glasses of wine per week  . Drug use: No  . Sexual activity: Yes    Partners: Male    Birth control/protection: I.U.D.  Other Topics Concern  . Not on file  Social History Narrative  . Not on file   Social Determinants of Health   Financial Resource Strain:   . Difficulty of Paying Living Expenses: Not on file  Food Insecurity:   . Worried About Charity fundraiser in the Last Year: Not on file  . Ran Out of Food in the Last Year: Not on file  Transportation Needs:   . Lack of Transportation (Medical): Not on file  . Lack of Transportation (Non-Medical): Not on file  Physical Activity:   .  Days of Exercise per Week: Not on file  . Minutes of Exercise per Session: Not on file  Stress:   . Feeling of Stress : Not on file  Social Connections:   . Frequency of Communication with Friends and Family: Not on file  . Frequency of Social Gatherings with Friends and Family: Not on file  . Attends Religious Services: Not on file  . Active Member of Clubs or Organizations: Not on file  . Attends Archivist Meetings: Not on file  . Marital Status: Not on file  Intimate Partner Violence:   . Fear of Current or Ex-Partner: Not on file  . Emotionally Abused: Not on file  . Physically Abused: Not on file  . Sexually Abused: Not on file    Family History  Problem Relation Age of Onset  . Diabetes Mother   . Diabetes Sister   . Diabetes Maternal Grandmother   . Heart disease Maternal  Grandmother   . Hypertension Maternal Grandmother   . Kidney cancer Maternal Grandmother   . Diabetes Maternal Grandfather   . Heart disease Maternal Grandfather   . Hypertension Maternal Grandfather   . Diabetes Maternal Aunt   . Breast cancer Neg Hx   . Ovarian cancer Neg Hx   . Colon cancer Neg Hx     The following portions of the patient's history were reviewed and updated as appropriate: allergies, current medications, past family history, past medical history, past social history, past surgical history and problem list.  Review of Systems  ROS negative except as noted above. Information obtained from patient.   Objective:   BP 93/66   Pulse 67   Ht 5\' 8"  (1.727 m)   Wt 227 lb 9.6 oz (103.2 kg)   LMP  (LMP Unknown)   BMI 34.61 kg/m    CONSTITUTIONAL: Well-developed, well-nourished female in no acute distress, tearful.   PHYSICAL EXAM: Not indicated.   GAD 7 : Generalized Anxiety Score 11/04/2019  Nervous, Anxious, on Edge 3  Control/stop worrying 2  Worry too much - different things 2  Trouble relaxing 1  Restless 1  Easily annoyed or irritable 3  Afraid - awful might happen 3  Total GAD 7 Score 15  Anxiety Difficulty Somewhat difficult    Depression screen [facility] 2/9 11/04/2019 09/04/2015 08/14/2015  Decreased Interest 2 3 0  Down, Depressed, Hopeless 1 3 2   PHQ - 2 Score 3 6 2   Altered sleeping 1 3 2   Tired, decreased energy 2 2 1   Change in appetite 1 1 2   Feeling bad or failure about yourself  2 3 2   Trouble concentrating 1 1 0  Moving slowly or fidgety/restless 1 0 0  Suicidal thoughts 0 0 0  PHQ-9 Score 11 16 9   Difficult doing work/chores Somewhat difficult Very difficult Somewhat difficult    Assessment:   1. BMI 34.0-34.9,adult   2. Encounter for weight management   3. Anxiety   Plan:   B-12 given today, see chart.   Rx Phentermine, B-12, and Wellbutrin, see orders.   Encouraged counseling and contact provided.   Reviewed red flag  symptoms and when to call.   RTC x 4 weeks for weigh, blood pressure and mood check or sooner if needed.    Diona Fanti, CNM [facility], [facility] 11/04/19 5:20 PM

## 2019-11-04 NOTE — Progress Notes (Signed)
Patient here to discuss weight loss management options.  Patient also c/o "everything make me anxious" causing problems with spouse.

## 2019-11-04 NOTE — Patient Instructions (Signed)

## 2019-12-02 ENCOUNTER — Ambulatory Visit (INDEPENDENT_AMBULATORY_CARE_PROVIDER_SITE_OTHER): Payer: 59 | Admitting: Certified Nurse Midwife

## 2019-12-02 ENCOUNTER — Other Ambulatory Visit: Payer: Self-pay | Admitting: Certified Nurse Midwife

## 2019-12-02 ENCOUNTER — Encounter: Payer: Self-pay | Admitting: Certified Nurse Midwife

## 2019-12-02 ENCOUNTER — Other Ambulatory Visit: Payer: Self-pay

## 2019-12-02 VITALS — BP 104/77 | HR 79 | Ht 68.0 in | Wt 214.7 lb

## 2019-12-02 DIAGNOSIS — R634 Abnormal weight loss: Secondary | ICD-10-CM

## 2019-12-02 DIAGNOSIS — F419 Anxiety disorder, unspecified: Secondary | ICD-10-CM

## 2019-12-02 DIAGNOSIS — Z7689 Persons encountering health services in other specified circumstances: Secondary | ICD-10-CM

## 2019-12-02 DIAGNOSIS — Z6832 Body mass index (BMI) 32.0-32.9, adult: Secondary | ICD-10-CM | POA: Diagnosis not present

## 2019-12-02 DIAGNOSIS — T50905A Adverse effect of unspecified drugs, medicaments and biological substances, initial encounter: Secondary | ICD-10-CM

## 2019-12-02 MED ORDER — BUPROPION HCL ER (XL) 150 MG PO TB24: ORAL_TABLET | ORAL | 5 refills | Status: DC

## 2019-12-02 MED ORDER — CYANOCOBALAMIN 1000 MCG/ML IJ SOLN
1000.0000 ug | Freq: Once | INTRAMUSCULAR | Status: AC
  Administered 2019-12-02: 10:00:00 1000 ug via INTRAMUSCULAR

## 2019-12-02 NOTE — Addendum Note (Signed)
Addended by: Durwin Glaze on: 12/02/2019 09:58 AM   Modules accepted: Orders

## 2019-12-02 NOTE — Patient Instructions (Signed)
Bupropion tablets (Depression/Mood Disorders) What is this medicine? BUPROPION (byoo PROE pee on) is used to treat depression. This medicine may be used for other purposes; ask your health care provider or pharmacist if you have questions. COMMON BRAND NAME(S): Wellbutrin What should I tell my health care provider before I take this medicine? They need to know if you have any of these conditions:  an eating disorder, such as anorexia or bulimia  bipolar disorder or psychosis  diabetes or high blood sugar, treated with medication  glaucoma  heart disease, previous heart attack, or irregular heart beat  head injury or brain tumor  high blood pressure  kidney or liver disease  seizures  suicidal thoughts or a previous suicide attempt  Tourette's syndrome  weight loss  an unusual or allergic reaction to bupropion, other medicines, foods, dyes, or preservatives  breast-feeding  pregnant or trying to become pregnant How should I use this medicine? Take this medicine by mouth with a glass of water. Follow the directions on the prescription label. You can take it with or without food. If it upsets your stomach, take it with food. Take your medicine at regular intervals. Do not take your medicine more often than directed. Do not stop taking this medicine suddenly except upon the advice of your doctor. Stopping this medicine too quickly may cause serious side effects or your condition may worsen. A special MedGuide will be given to you by the pharmacist with each prescription and refill. Be sure to read this information carefully each time. Talk to your pediatrician regarding the use of this medicine in children. Special care may be needed. Overdosage: If you think you have taken too much of this medicine contact a poison control center or emergency room at once. NOTE: This medicine is only for you. Do not share this medicine with others. What if I miss a dose? If you miss a dose,  take it as soon as you can. If it is less than four hours to your next dose, take only that dose and skip the missed dose. Do not take double or extra doses. What may interact with this medicine? Do not take this medicine with any of the following medications:  linezolid  MAOIs like Azilect, Carbex, Eldepryl, Marplan, Nardil, and Parnate  methylene blue (injected into a vein)  other medicines that contain bupropion like Zyban This medicine may also interact with the following medications:  alcohol  certain medicines for anxiety or sleep  certain medicines for blood pressure like metoprolol, propranolol  certain medicines for depression or psychotic disturbances  certain medicines for HIV or AIDS like efavirenz, lopinavir, nelfinavir, ritonavir  certain medicines for irregular heart beat like propafenone, flecainide  certain medicines for Parkinson's disease like amantadine, levodopa  certain medicines for seizures like carbamazepine, phenytoin, phenobarbital  cimetidine  clopidogrel  cyclophosphamide  digoxin  furazolidone  isoniazid  nicotine  orphenadrine  procarbazine  steroid medicines like prednisone or cortisone  stimulant medicines for attention disorders, weight loss, or to stay awake  tamoxifen  theophylline  thiotepa  ticlopidine  tramadol  warfarin This list may not describe all possible interactions. Give your health care provider a list of all the medicines, herbs, non-prescription drugs, or dietary supplements you use. Also tell them if you smoke, drink alcohol, or use illegal drugs. Some items may interact with your medicine. What should I watch for while using this medicine? Tell your doctor if your symptoms do not get better or if they get worse.   Visit your doctor or healthcare provider for regular checks on your progress. Because it may take several weeks to see the full effects of this medicine, it is important to continue your  treatment as prescribed by your doctor. This medicine may cause serious skin reactions. They can happen weeks to months after starting the medicine. Contact your healthcare provider right away if you notice fevers or flu-like symptoms with a rash. The rash may be red or purple and then turn into blisters or peeling of the skin. Or, you might notice a red rash with swelling of the face, lips or lymph nodes in your neck or under your arms. Patients and their families should watch out for new or worsening thoughts of suicide or depression. Also watch out for sudden changes in feelings such as feeling anxious, agitated, panicky, irritable, hostile, aggressive, impulsive, severely restless, overly excited and hyperactive, or not being able to sleep. If this happens, especially at the beginning of treatment or after a change in dose, call your healthcare provider. Avoid alcoholic drinks while taking this medicine. Drinking excessive alcoholic beverages, using sleeping or anxiety medicines, or quickly stopping the use of these agents while taking this medicine may increase your risk for a seizure. Do not drive or use heavy machinery until you know how this medicine affects you. This medicine can impair your ability to perform these tasks. Do not take this medicine close to bedtime. It may prevent you from sleeping. Your mouth may get dry. Chewing sugarless gum or sucking hard candy, and drinking plenty of water may help. Contact your doctor if the problem does not go away or is severe. What side effects may I notice from receiving this medicine? Side effects that you should report to your doctor or health care professional as soon as possible:  allergic reactions like skin rash, itching or hives, swelling of the face, lips, or tongue  breathing problems  changes in vision  confusion  elevated mood, decreased need for sleep, racing thoughts, impulsive behavior  fast or irregular heartbeat   hallucinations, loss of contact with reality  increased blood pressure  rash, fever, and swollen lymph nodes  redness, blistering, peeling, or loosening of the skin, including inside the mouth  seizures  suicidal thoughts or other mood changes  unusually weak or tired  vomiting Side effects that usually do not require medical attention (report to your doctor or health care professional if they continue or are bothersome):  constipation  headache  loss of appetite  nausea  tremors  weight loss This list may not describe all possible side effects. Call your doctor for medical advice about side effects. You may report side effects to FDA at 1-800-FDA-1088. Where should I keep my medicine? Keep out of the reach of children. Store at room temperature between 20 and 25 degrees C (68 and 77 degrees F), away from direct sunlight and moisture. Keep tightly closed. Throw away any unused medicine after the expiration date. NOTE: This sheet is a summary. It may not cover all possible information. If you have questions about this medicine, talk to your doctor, pharmacist, or health care provider.  2020 Elsevier/Gold Standard (2018-11-15 14:02:47)   Calorie Counting for Weight Loss Calories are units of energy. Your body needs a certain amount of calories from food to keep you going throughout the day. When you eat more calories than your body needs, your body stores the extra calories as fat. When you eat fewer calories than your body needs,  your body burns fat to get the energy it needs. Calorie counting means keeping track of how many calories you eat and drink each day. Calorie counting can be helpful if you need to lose weight. If you make sure to eat fewer calories than your body needs, you should lose weight. Ask your health care provider what a healthy weight is for you. For calorie counting to work, you will need to eat the right number of calories in a day in order to lose a  healthy amount of weight per week. A dietitian can help you determine how many calories you need in a day and will give you suggestions on how to reach your calorie goal.  A healthy amount of weight to lose per week is usually 1-2 lb (0.5-0.9 kg). This usually means that your daily calorie intake should be reduced by 500-750 calories.  Eating 1,200 - 1,500 calories per day can help most women lose weight.  Eating 1,500 - 1,800 calories per day can help most men lose weight. What is my plan? My goal is to have __________ calories per day. If I have this many calories per day, I should lose around __________ pounds per week. What do I need to know about calorie counting? In order to meet your daily calorie goal, you will need to:  Find out how many calories are in each food you would like to eat. Try to do this before you eat.  Decide how much of the food you plan to eat.  Write down what you ate and how many calories it had. Doing this is called keeping a food log. To successfully lose weight, it is important to balance calorie counting with a healthy lifestyle that includes regular activity. Aim for 150 minutes of moderate exercise (such as walking) or 75 minutes of vigorous exercise (such as running) each week. Where do I find calorie information?  The number of calories in a food can be found on a Nutrition Facts label. If a food does not have a Nutrition Facts label, try to look up the calories online or ask your dietitian for help. Remember that calories are listed per serving. If you choose to have more than one serving of a food, you will have to multiply the calories per serving by the amount of servings you plan to eat. For example, the label on a package of bread might say that a serving size is 1 slice and that there are 90 calories in a serving. If you eat 1 slice, you will have eaten 90 calories. If you eat 2 slices, you will have eaten 180 calories. How do I keep a food log?  Immediately after each meal, record the following information in your food log:  What you ate. Don't forget to include toppings, sauces, and other extras on the food.  How much you ate. This can be measured in cups, ounces, or number of items.  How many calories each food and drink had.  The total number of calories in the meal. Keep your food log near you, such as in a small notebook in your pocket, or use a mobile app or website. Some programs will calculate calories for you and show you how many calories you have left for the day to meet your goal. What are some calorie counting tips?   Use your calories on foods and drinks that will fill you up and not leave you hungry: ? Some examples of foods that fill you  up are nuts and nut butters, vegetables, lean proteins, and high-fiber foods like whole grains. High-fiber foods are foods with more than 5 g fiber per serving. ? Drinks such as sodas, specialty coffee drinks, alcohol, and juices have a lot of calories, yet do not fill you up.  Eat nutritious foods and avoid empty calories. Empty calories are calories you get from foods or beverages that do not have many vitamins or protein, such as candy, sweets, and soda. It is better to have a nutritious high-calorie food (such as an avocado) than a food with few nutrients (such as a bag of chips).  Know how many calories are in the foods you eat most often. This will help you calculate calorie counts faster.  Pay attention to calories in drinks. Low-calorie drinks include water and unsweetened drinks.  Pay attention to nutrition labels for "low fat" or "fat free" foods. These foods sometimes have the same amount of calories or more calories than the full fat versions. They also often have added sugar, starch, or salt, to make up for flavor that was removed with the fat.  Find a way of tracking calories that works for you. Get creative. Try different apps or programs if writing down calories does  not work for you. What are some portion control tips?  Know how many calories are in a serving. This will help you know how many servings of a certain food you can have.  Use a measuring cup to measure serving sizes. You could also try weighing out portions on a kitchen scale. With time, you will be able to estimate serving sizes for some foods.  Take some time to put servings of different foods on your favorite plates, bowls, and cups so you know what a serving looks like.  Try not to eat straight from a bag or box. Doing this can lead to overeating. Put the amount you would like to eat in a cup or on a plate to make sure you are eating the right portion.  Use smaller plates, glasses, and bowls to prevent overeating.  Try not to multitask (for example, watch TV or use your computer) while eating. If it is time to eat, sit down at a table and enjoy your food. This will help you to know when you are full. It will also help you to be aware of what you are eating and how much you are eating. What are tips for following this plan? Reading food labels  Check the calorie count compared to the serving size. The serving size may be smaller than what you are used to eating.  Check the source of the calories. Make sure the food you are eating is high in vitamins and protein and low in saturated and trans fats. Shopping  Read nutrition labels while you shop. This will help you make healthy decisions before you decide to purchase your food.  Make a grocery list and stick to it. Cooking  Try to cook your favorite foods in a healthier way. For example, try baking instead of frying.  Use low-fat dairy products. Meal planning  Use more fruits and vegetables. Half of your plate should be fruits and vegetables.  Include lean proteins like poultry and fish. How do I count calories when eating out?  Ask for smaller portion sizes.  Consider sharing an entree and sides instead of getting your own  entree.  If you get your own entree, eat only half. Ask for a box at  the beginning of your meal and put the rest of your entree in it so you are not tempted to eat it.  If calories are listed on the menu, choose the lower calorie options.  Choose dishes that include vegetables, fruits, whole grains, low-fat dairy products, and lean protein.  Choose items that are boiled, broiled, grilled, or steamed. Stay away from items that are buttered, battered, fried, or served with cream sauce. Items labeled "crispy" are usually fried, unless stated otherwise.  Choose water, low-fat milk, unsweetened iced tea, or other drinks without added sugar. If you want an alcoholic beverage, choose a lower calorie option such as a glass of wine or light beer.  Ask for dressings, sauces, and syrups on the side. These are usually high in calories, so you should limit the amount you eat.  If you want a salad, choose a garden salad and ask for grilled meats. Avoid extra toppings like bacon, cheese, or fried items. Ask for the dressing on the side, or ask for olive oil and vinegar or lemon to use as dressing.  Estimate how many servings of a food you are given. For example, a serving of cooked rice is  cup or about the size of half a baseball. Knowing serving sizes will help you be aware of how much food you are eating at restaurants. The list below tells you how big or small some common portion sizes are based on everyday objects: ? 1 oz-4 stacked dice. ? 3 oz-1 deck of cards. ? 1 tsp-1 die. ? 1 Tbsp- a ping-pong ball. ? 2 Tbsp-1 ping-pong ball. ?  cup- baseball. ? 1 cup-1 baseball. Summary  Calorie counting means keeping track of how many calories you eat and drink each day. If you eat fewer calories than your body needs, you should lose weight.  A healthy amount of weight to lose per week is usually 1-2 lb (0.5-0.9 kg). This usually means reducing your daily calorie intake by 500-750 calories.  The number  of calories in a food can be found on a Nutrition Facts label. If a food does not have a Nutrition Facts label, try to look up the calories online or ask your dietitian for help.  Use your calories on foods and drinks that will fill you up, and not on foods and drinks that will leave you hungry.  Use smaller plates, glasses, and bowls to prevent overeating. This information is not intended to replace advice given to you by your health care provider. Make sure you discuss any questions you have with your health care provider. Document Revised: 05/11/2018 Document Reviewed: 07/22/2016 Elsevier Patient Education  [place].

## 2019-12-02 NOTE — Addendum Note (Signed)
Addended by: Durwin Glaze on: 12/02/2019 09:54 AM   Modules accepted: Orders

## 2019-12-02 NOTE — Progress Notes (Signed)
GYN ENCOUNTER NOTE  Subjective:       Jennifer Morse is a 29 y.o. G47P2001 female here for medication check, weight and blood pressure.   Restarted phentermine for medical weight loss last month. Total loss of 13 pounds.   Taking Wellbutrin 150 mg twice a day. Feels like it is helping, still has days and moments with increased anxiety.   Denies difficulty breathing or respiratory distress, chest pain, abdominal pain, excessive vaginal bleeding, dysuria, and leg pain or swelling.    Gynecologic History  No LMP recorded (lmp unknown). (Menstrual status: IUD).  Contraception: IUD, Mirena  Last Pap: up to date.   Obstetric History  OB History  Gravida Para Term Preterm AB Living  2 2 2  0 0 1  SAB TAB Ectopic Multiple Live Births  0 0 0 0 1    # Outcome Date GA Lbr Len/2nd Weight Sex Delivery Anes PTL Lv  2 Term 07/08/17 [redacted]w[redacted]d 15:15 / 00:28 8 lb 2.9 oz (3.71 kg) M Vag-Spont EPI N LIV     Birth Comments: Dimpled cheek (right)  1 Term 07/01/15 [redacted]w[redacted]d 218:30 / 02:26 8 lb 2.9 oz (3.71 kg) F Vag-Spont EPI  LIV    Obstetric Comments  1st Menstrual Cycle:  15   1st Pregnancy:  24    Past Medical History:  Diagnosis Date  . Anxiety   . Frequent headaches   . Gestational thrombocytopenia ([place])   . Migraine with aura, intractable, with status migrainosus 1995  . Oligomenorrhea   . Postpartum depression    with first pregnancy    Past Surgical History:  Procedure Laterality Date  . WISDOM TOOTH EXTRACTION  2012    Current Outpatient Medications on File Prior to Visit  Medication Sig Dispense Refill  . aspirin-acetaminophen-caffeine (EXCEDRIN MIGRAINE) 250-250-65 MG tablet Take by mouth every 6 (six) hours as needed for headache.    Marland Kitchen azelastine (ASTELIN) 0.1 % nasal spray Place 1 spray into both nostrils 2 (two) times daily. Use in each nostril as directed (Patient taking differently: Place 1 spray into both nostrils as needed. Use in each nostril as directed) 30 mL 12  .  buPROPion (WELLBUTRIN XL) 150 MG 24 hr tablet Take 1 tablet (150 mg total) by mouth daily. May increase to two (2) tablets daily after one (1) week 30 tablet 1  . phentermine 37.5 MG capsule Take 1 capsule (37.5 mg total) by mouth every morning. 30 capsule 2  . SYNTHROID 25 MCG tablet Take one tab daily 30 tablet 5   No current facility-administered medications on file prior to visit.    No Known Allergies  Social History   Socioeconomic History  . Marital status: Married    Spouse name: Edison Nasuti  . Number of children: 2  . Years of education: Not on file  . Highest education level: Not on file  Occupational History  . Occupation: Therapist, sports  Tobacco Use  . Smoking status: Former Smoker    Packs/day: 0.25    Types: Cigarettes    Quit date: 02/26/2015    Years since quitting: 4.7  . Smokeless tobacco: Never Used  Substance and Sexual Activity  . Alcohol use: Yes    Alcohol/week: 1.0 standard drinks    Types: 1 Glasses of wine per week  . Drug use: No  . Sexual activity: Yes    Partners: Male    Birth control/protection: I.U.D.  Other Topics Concern  . Not on file  Social History Narrative  .  Not on file   Social Determinants of Health   Financial Resource Strain:   . Difficulty of Paying Living Expenses:   Food Insecurity:   . Worried About Charity fundraiser in the Last Year:   . Arboriculturist in the Last Year:   Transportation Needs:   . Film/video editor (Medical):   Marland Kitchen Lack of Transportation (Non-Medical):   Physical Activity:   . Days of Exercise per Week:   . Minutes of Exercise per Session:   Stress:   . Feeling of Stress :   Social Connections:   . Frequency of Communication with Friends and Family:   . Frequency of Social Gatherings with Friends and Family:   . Attends Religious Services:   . Active Member of Clubs or Organizations:   . Attends Archivist Meetings:   Marland Kitchen Marital Status:   Intimate Partner Violence:   . Fear of Current or  Ex-Partner:   . Emotionally Abused:   Marland Kitchen Physically Abused:   . Sexually Abused:     Family History  Problem Relation Age of Onset  . Diabetes Mother   . Diabetes Sister   . Diabetes Maternal Grandmother   . Heart disease Maternal Grandmother   . Hypertension Maternal Grandmother   . Kidney cancer Maternal Grandmother   . Diabetes Maternal Grandfather   . Heart disease Maternal Grandfather   . Hypertension Maternal Grandfather   . Diabetes Maternal Aunt   . Breast cancer Neg Hx   . Ovarian cancer Neg Hx   . Colon cancer Neg Hx     The following portions of the patient's history were reviewed and updated as appropriate: allergies, current medications, past family history, past medical history, past social history, past surgical history and problem list.  Review of Systems  ROS negative except as noted above. Information obtained from patient.   Objective:   BP 104/77   Pulse 79   Ht 5\' 8"  (1.727 m)   Wt 214 lb 11.2 oz (97.4 kg)   LMP  (LMP Unknown)   BMI 32.65 kg/m    CONSTITUTIONAL: Well-developed, well-nourished female in no acute distress.   PHYSICAL EXAM: Not indicated.   Depression screen [facility] 2/9 12/02/2019 11/04/2019 09/04/2015 08/14/2015  Decreased Interest 0 2 3 0  Down, Depressed, Hopeless 1 1 3 2   PHQ - 2 Score 1 3 6 2   Altered sleeping 0 1 3 2   Tired, decreased energy 0 2 2 1   Change in appetite 0 1 1 2   Feeling bad or failure about yourself  1 2 3 2   Trouble concentrating 0 1 1 0  Moving slowly or fidgety/restless 0 1 0 0  Suicidal thoughts 0 0 0 0  PHQ-9 Score 2 11 16 9   Difficult doing work/chores Not difficult at all Somewhat difficult Very difficult Somewhat difficult   GAD 7 : Generalized Anxiety Score 12/02/2019 11/04/2019  Nervous, Anxious, on Edge 1 3  Control/stop worrying 1 2  Worry too much - different things 1 2  Trouble relaxing 0 1  Restless 0 1  Easily annoyed or irritable 1 3  Afraid - awful might happen 1 3  Total GAD 7 Score 5 15   Anxiety Difficulty Not difficult at all Somewhat difficult    Assessment:   1. Encounter for weight management   2. Anxiety   3. Weight loss due to medication   4. BMI 32.0-32.9,adult   Plan:   Praise given, continue medication  as prescribed.   B-12 injection given today, see chart.   Rx: Wellbutrin, see orders.   Reviewed red flag symptoms and when to call.   RTC x 4 weeks for weight and blood pressure check.    Diona Fanti, CNM [facility], [facility] 12/02/19 9:36 AM

## 2019-12-30 ENCOUNTER — Encounter: Payer: Self-pay | Admitting: Certified Nurse Midwife

## 2019-12-30 ENCOUNTER — Ambulatory Visit (INDEPENDENT_AMBULATORY_CARE_PROVIDER_SITE_OTHER): Payer: 59 | Admitting: Certified Nurse Midwife

## 2019-12-30 ENCOUNTER — Other Ambulatory Visit: Payer: Self-pay

## 2019-12-30 VITALS — BP 110/78 | HR 69 | Ht 68.0 in | Wt 208.2 lb

## 2019-12-30 DIAGNOSIS — R634 Abnormal weight loss: Secondary | ICD-10-CM | POA: Diagnosis not present

## 2019-12-30 DIAGNOSIS — Z7689 Persons encountering health services in other specified circumstances: Secondary | ICD-10-CM

## 2019-12-30 DIAGNOSIS — Z6831 Body mass index (BMI) 31.0-31.9, adult: Secondary | ICD-10-CM | POA: Diagnosis not present

## 2019-12-30 MED ORDER — CYANOCOBALAMIN 1000 MCG/ML IJ SOLN
1000.0000 ug | Freq: Once | INTRAMUSCULAR | Status: AC
  Administered 2019-12-30: 10:00:00 1000 ug via INTRAMUSCULAR

## 2019-12-30 NOTE — Progress Notes (Signed)
GYN ENCOUNTER NOTE  Subjective:       Jennifer Morse is a 29 y.o. G36P2001 female here for weight, blood pressure check and b-12 injection.   Doing well, feeling good. Lost six (6) pounds in the last month.   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   Denies difficulty breathing or respiratory distress, chest pain, abdominal pain, excessive vaginal bleeding, dysuria, and leg pain or swelling.    Gynecologic History  No LMP recorded. (Menstrual status: IUD).  Contraception: IUD, Mirena  Last Pap: Up to date  Obstetric History  OB History  Gravida Para Term Preterm AB Living  2 2 2  0 0 1  SAB TAB Ectopic Multiple Live Births  0 0 0 0 1    # Outcome Date GA Lbr Len/2nd Weight Sex Delivery Anes PTL Lv  2 Term 07/08/17 [redacted]w[redacted]d 15:15 / 00:28 8 lb 2.9 oz (3.71 kg) M Vag-Spont EPI N LIV     Birth Comments: Dimpled cheek (right)  1 Term 07/01/15 [redacted]w[redacted]d 218:30 / 02:26 8 lb 2.9 oz (3.71 kg) F Vag-Spont EPI  LIV    Obstetric Comments  1st Menstrual Cycle:  15   1st Pregnancy:  24    Past Medical History:  Diagnosis Date  . Anxiety   . Frequent headaches   . Gestational thrombocytopenia ([place])   . Migraine with aura, intractable, with status migrainosus 1995  . Oligomenorrhea   . Postpartum depression    with first pregnancy    Past Surgical History:  Procedure Laterality Date  . WISDOM TOOTH EXTRACTION  2012    Current Outpatient Medications on File Prior to Visit  Medication Sig Dispense Refill  . aspirin-acetaminophen-caffeine (EXCEDRIN MIGRAINE) 250-250-65 MG tablet Take by mouth every 6 (six) hours as needed for headache.    Marland Kitchen azelastine (ASTELIN) 0.1 % nasal spray Place 1 spray into both nostrils 2 (two) times daily. Use in each nostril as directed (Patient taking differently: Place 1 spray into both nostrils as needed. Use in each nostril as directed) 30 mL 12  . buPROPion (WELLBUTRIN XL) 150 MG 24 hr tablet TAKE 1 TABLET BY MOUTH DAILY. MAY INCREASE TO 2  TABLETS DAILY AFTER 1 WEEK 60 tablet 5  . phentermine 37.5 MG capsule Take 1 capsule (37.5 mg total) by mouth every morning. 30 capsule 2  . SYNTHROID 25 MCG tablet Take one tab daily 30 tablet 5   No current facility-administered medications on file prior to visit.    No Known Allergies  Social History   Socioeconomic History  . Marital status: Married    Spouse name: Edison Nasuti  . Number of children: 2  . Years of education: Not on file  . Highest education level: Not on file  Occupational History  . Occupation: Therapist, sports  Tobacco Use  . Smoking status: Former Smoker    Packs/day: 0.25    Types: Cigarettes    Quit date: 02/26/2015    Years since quitting: 4.8  . Smokeless tobacco: Never Used  Substance and Sexual Activity  . Alcohol use: Yes    Alcohol/week: 1.0 standard drinks    Types: 1 Glasses of wine per week  . Drug use: No  . Sexual activity: Yes    Partners: Male    Birth control/protection: I.U.D.  Other Topics Concern  . Not on file  Social History Narrative  . Not on file   Social Determinants of Health   Financial Resource Strain:   . Difficulty of Paying Living  Expenses:   Food Insecurity:   . Worried About Charity fundraiser in the Last Year:   . Arboriculturist in the Last Year:   Transportation Needs:   . Film/video editor (Medical):   Marland Kitchen Lack of Transportation (Non-Medical):   Physical Activity:   . Days of Exercise per Week:   . Minutes of Exercise per Session:   Stress:   . Feeling of Stress :   Social Connections:   . Frequency of Communication with Friends and Family:   . Frequency of Social Gatherings with Friends and Family:   . Attends Religious Services:   . Active Member of Clubs or Organizations:   . Attends Archivist Meetings:   Marland Kitchen Marital Status:   Intimate Partner Violence:   . Fear of Current or Ex-Partner:   . Emotionally Abused:   Marland Kitchen Physically Abused:   . Sexually Abused:     Family History  Problem Relation Age  of Onset  . Diabetes Mother   . Diabetes Sister   . Diabetes Maternal Grandmother   . Heart disease Maternal Grandmother   . Hypertension Maternal Grandmother   . Kidney cancer Maternal Grandmother   . Diabetes Maternal Grandfather   . Heart disease Maternal Grandfather   . Hypertension Maternal Grandfather   . Diabetes Maternal Aunt   . Breast cancer Neg Hx   . Ovarian cancer Neg Hx   . Colon cancer Neg Hx     The following portions of the patient's history were reviewed and updated as appropriate: allergies, current medications, past family history, past medical history, past social history, past surgical history and problem list.  Review of Systems  ROS negative except as noted above. Information obtained from patient.   Objective:   BP 110/78   Pulse 69   Ht 5\' 8"  (1.727 m)   Wt 208 lb 3.2 oz (94.4 kg)   BMI 31.66 kg/m    CONSTITUTIONAL: Well-developed, well-nourished female in no acute distress.   WAIST CIRCUMFERENCE: 45 inches.  Assessment:   1. Encounter for weight management  - cyanocobalamin ((VITAMIN B-12)) injection 1,000 mcg  2. BMI 31.0-31.9,adult   3. Weight loss due to medication   Plan:   Praise given.   B-12 injection given, see chart.   Reviewed red flag symptoms and when to call.   RTC x 4 weeks for weight, blood pressure and b-12 injection or sooner if needed.    Diona Fanti, CNM [facility], [facility] 12/30/19 2:56 PM

## 2019-12-30 NOTE — Progress Notes (Signed)
Patient comes in today for weight check. She is down 6 pounds from her last visit. She is tolerating Phentermine very well. She exercises about twice a week for about 20 minutes. Hoping to increase this now that she has finished her BSN. Waist measured at 45 in today.

## 2019-12-30 NOTE — Patient Instructions (Signed)
Phentermine tablets or capsules What is this medicine? PHENTERMINE (FEN ter meen) decreases your appetite. It is used with a reduced calorie diet and exercise to help you lose weight. This medicine may be used for other purposes; ask your health care provider or pharmacist if you have questions. COMMON BRAND NAME(S): Adipex-P, Atti-Plex P, Atti-Plex P Spansule, Fastin, Lomaira, Pro-Fast, Tara-8 What should I tell my health care provider before I take this medicine? They need to know if you have any of these conditions:  agitation or nervousness  diabetes  glaucoma  heart disease  high blood pressure  history of drug abuse or addiction  history of stroke  kidney disease  lung disease called Primary Pulmonary Hypertension (PPH)  taken an MAOI like Carbex, Eldepryl, Marplan, Nardil, or Parnate in last 14 days  taking stimulant medicines for attention disorders, weight loss, or to stay awake  thyroid disease  an unusual or allergic reaction to phentermine, other medicines, foods, dyes, or preservatives  pregnant or trying to get pregnant  breast-feeding How should I use this medicine? Take this medicine by mouth with a glass of water. Follow the directions on the prescription label. Take your medicine at regular intervals. Do not take it more often than directed. Do not stop taking except on your doctor's advice. Talk to your pediatrician regarding the use of this medicine in children. While this drug may be prescribed for children 17 years or older for selected conditions, precautions do apply. Overdosage: If you think you have taken too much of this medicine contact a poison control center or emergency room at once. NOTE: This medicine is only for you. Do not share this medicine with others. What if I miss a dose? If you miss a dose, take it as soon as you can. If it is almost time for your next dose, take only that dose. Do not take double or extra doses. What may interact  with this medicine? Do not take this medicine with any of the following medications:  MAOIs like Carbex, Eldepryl, Marplan, Nardil, and Parnate This medicine may also interact with the following medications:  alcohol  certain medicines for depression, anxiety, or psychotic disorders  certain medicines for high blood pressure  linezolid  medicines for colds or breathing difficulties like pseudoephedrine or phenylephrine  medicines for diabetes  sibutramine  stimulant medicines for attention disorders, weight loss, or to stay awake This list may not describe all possible interactions. Give your health care provider a list of all the medicines, herbs, non-prescription drugs, or dietary supplements you use. Also tell them if you smoke, drink alcohol, or use illegal drugs. Some items may interact with your medicine. What should I watch for while using this medicine? Visit your doctor or health care provider for regular checks on your progress. Do not stop taking except on your health care provider's advice. You may develop a severe reaction. Your health care provider will tell you how much medicine to take. Do not take this medicine close to bedtime. It may prevent you from sleeping. You may get drowsy or dizzy. Do not drive, use machinery, or do anything that needs mental alertness until you know how this medicine affects you. Do not stand or sit up quickly, especially if you are an older patient. This reduces the risk of dizzy or fainting spells. Alcohol may increase dizziness and drowsiness. Avoid alcoholic drinks. This medicine may affect blood sugar levels. Ask your healthcare provider if changes in diet or medicines are needed   if you have diabetes. Women should inform their health care provider if they wish to become pregnant or think they might be pregnant. Losing weight while pregnant is not advised and may cause harm to the unborn child. Talk to your health care provider for more  information. What side effects may I notice from receiving this medicine? Side effects that you should report to your doctor or health care professional as soon as possible:  allergic reactions like skin rash, itching or hives, swelling of the face, lips, or tongue  breathing problems  changes in emotions or moods  changes in vision  chest pain or chest tightness  fast, irregular heartbeat  feeling faint or lightheaded  increased blood pressure  irritable  restlessness  tremors  seizures  signs and symptoms of a stroke like changes in vision; confusion; trouble speaking or understanding; severe headaches; sudden numbness or weakness of the face, arm or leg; trouble walking; dizziness; loss of balance or coordination  unusually weak or tired Side effects that usually do not require medical attention (report to your doctor or health care professional if they continue or are bothersome):  changes in taste  constipation or diarrhea  dizziness  dry mouth  headache  trouble sleeping  upset stomach This list may not describe all possible side effects. Call your doctor for medical advice about side effects. You may report side effects to FDA at 1-800-FDA-1088. Where should I keep my medicine? Keep out of the reach of children. This medicine can be abused. Keep your medicine in a safe place to protect it from theft. Do not share this medicine with anyone. Selling or giving away this medicine is dangerous and against the law. This medicine may cause harm and death if it is taken by other adults, children, or pets. Return medicine that has not been used to an official disposal site. Contact the DEA at 1-800-882-9539 or your city/county government to find a site. If you cannot return the medicine, mix any unused medicine with a substance like cat litter or coffee grounds. Then throw the medicine away in a sealed container like a sealed bag or coffee can with a lid. Do not use the  medicine after the expiration date. Store at room temperature between 20 and 25 degrees C (68 and 77 degrees F). Keep container tightly closed. NOTE: This sheet is a summary. It may not cover all possible information. If you have questions about this medicine, talk to your doctor, pharmacist, or health care provider.  2020 Elsevier/Gold Standard (2019-06-28 12:54:20)   Calorie Counting for Weight Loss Calories are units of energy. Your body needs a certain amount of calories from food to keep you going throughout the day. When you eat more calories than your body needs, your body stores the extra calories as fat. When you eat fewer calories than your body needs, your body burns fat to get the energy it needs. Calorie counting means keeping track of how many calories you eat and drink each day. Calorie counting can be helpful if you need to lose weight. If you make sure to eat fewer calories than your body needs, you should lose weight. Ask your health care provider what a healthy weight is for you. For calorie counting to work, you will need to eat the right number of calories in a day in order to lose a healthy amount of weight per week. A dietitian can help you determine how many calories you need in a day and will   give you suggestions on how to reach your calorie goal.  A healthy amount of weight to lose per week is usually 1-2 lb (0.5-0.9 kg). This usually means that your daily calorie intake should be reduced by 500-750 calories.  Eating 1,200 - 1,500 calories per day can help most women lose weight.  Eating 1,500 - 1,800 calories per day can help most men lose weight. What is my plan? My goal is to have __________ calories per day. If I have this many calories per day, I should lose around __________ pounds per week. What do I need to know about calorie counting? In order to meet your daily calorie goal, you will need to:  Find out how many calories are in each food you would like to  eat. Try to do this before you eat.  Decide how much of the food you plan to eat.  Write down what you ate and how many calories it had. Doing this is called keeping a food log. To successfully lose weight, it is important to balance calorie counting with a healthy lifestyle that includes regular activity. Aim for 150 minutes of moderate exercise (such as walking) or 75 minutes of vigorous exercise (such as running) each week. Where do I find calorie information?  The number of calories in a food can be found on a Nutrition Facts label. If a food does not have a Nutrition Facts label, try to look up the calories online or ask your dietitian for help. Remember that calories are listed per serving. If you choose to have more than one serving of a food, you will have to multiply the calories per serving by the amount of servings you plan to eat. For example, the label on a package of bread might say that a serving size is 1 slice and that there are 90 calories in a serving. If you eat 1 slice, you will have eaten 90 calories. If you eat 2 slices, you will have eaten 180 calories. How do I keep a food log? Immediately after each meal, record the following information in your food log:  What you ate. Don't forget to include toppings, sauces, and other extras on the food.  How much you ate. This can be measured in cups, ounces, or number of items.  How many calories each food and drink had.  The total number of calories in the meal. Keep your food log near you, such as in a small notebook in your pocket, or use a mobile app or website. Some programs will calculate calories for you and show you how many calories you have left for the day to meet your goal. What are some calorie counting tips?   Use your calories on foods and drinks that will fill you up and not leave you hungry: ? Some examples of foods that fill you up are nuts and nut butters, vegetables, lean proteins, and high-fiber foods like  whole grains. High-fiber foods are foods with more than 5 g fiber per serving. ? Drinks such as sodas, specialty coffee drinks, alcohol, and juices have a lot of calories, yet do not fill you up.  Eat nutritious foods and avoid empty calories. Empty calories are calories you get from foods or beverages that do not have many vitamins or protein, such as candy, sweets, and soda. It is better to have a nutritious high-calorie food (such as an avocado) than a food with few nutrients (such as a bag of chips).  Know how   many calories are in the foods you eat most often. This will help you calculate calorie counts faster.  Pay attention to calories in drinks. Low-calorie drinks include water and unsweetened drinks.  Pay attention to nutrition labels for "low fat" or "fat free" foods. These foods sometimes have the same amount of calories or more calories than the full fat versions. They also often have added sugar, starch, or salt, to make up for flavor that was removed with the fat.  Find a way of tracking calories that works for you. Get creative. Try different apps or programs if writing down calories does not work for you. What are some portion control tips?  Know how many calories are in a serving. This will help you know how many servings of a certain food you can have.  Use a measuring cup to measure serving sizes. You could also try weighing out portions on a kitchen scale. With time, you will be able to estimate serving sizes for some foods.  Take some time to put servings of different foods on your favorite plates, bowls, and cups so you know what a serving looks like.  Try not to eat straight from a bag or box. Doing this can lead to overeating. Put the amount you would like to eat in a cup or on a plate to make sure you are eating the right portion.  Use smaller plates, glasses, and bowls to prevent overeating.  Try not to multitask (for example, watch TV or use your computer) while  eating. If it is time to eat, sit down at a table and enjoy your food. This will help you to know when you are full. It will also help you to be aware of what you are eating and how much you are eating. What are tips for following this plan? Reading food labels  Check the calorie count compared to the serving size. The serving size may be smaller than what you are used to eating.  Check the source of the calories. Make sure the food you are eating is high in vitamins and protein and low in saturated and trans fats. Shopping  Read nutrition labels while you shop. This will help you make healthy decisions before you decide to purchase your food.  Make a grocery list and stick to it. Cooking  Try to cook your favorite foods in a healthier way. For example, try baking instead of frying.  Use low-fat dairy products. Meal planning  Use more fruits and vegetables. Half of your plate should be fruits and vegetables.  Include lean proteins like poultry and fish. How do I count calories when eating out?  Ask for smaller portion sizes.  Consider sharing an entree and sides instead of getting your own entree.  If you get your own entree, eat only half. Ask for a box at the beginning of your meal and put the rest of your entree in it so you are not tempted to eat it.  If calories are listed on the menu, choose the lower calorie options.  Choose dishes that include vegetables, fruits, whole grains, low-fat dairy products, and lean protein.  Choose items that are boiled, broiled, grilled, or steamed. Stay away from items that are buttered, battered, fried, or served with cream sauce. Items labeled "crispy" are usually fried, unless stated otherwise.  Choose water, low-fat milk, unsweetened iced tea, or other drinks without added sugar. If you want an alcoholic beverage, choose a lower calorie option such as   a glass of wine or light beer.  Ask for dressings, sauces, and syrups on the side.  These are usually high in calories, so you should limit the amount you eat.  If you want a salad, choose a garden salad and ask for grilled meats. Avoid extra toppings like bacon, cheese, or fried items. Ask for the dressing on the side, or ask for olive oil and vinegar or lemon to use as dressing.  Estimate how many servings of a food you are given. For example, a serving of cooked rice is  cup or about the size of half a baseball. Knowing serving sizes will help you be aware of how much food you are eating at restaurants. The list below tells you how big or small some common portion sizes are based on everyday objects: ? 1 oz--4 stacked dice. ? 3 oz--1 deck of cards. ? 1 tsp--1 die. ? 1 Tbsp-- a ping-pong ball. ? 2 Tbsp--1 ping-pong ball. ?  cup-- baseball. ? 1 cup--1 baseball. Summary  Calorie counting means keeping track of how many calories you eat and drink each day. If you eat fewer calories than your body needs, you should lose weight.  A healthy amount of weight to lose per week is usually 1-2 lb (0.5-0.9 kg). This usually means reducing your daily calorie intake by 500-750 calories.  The number of calories in a food can be found on a Nutrition Facts label. If a food does not have a Nutrition Facts label, try to look up the calories online or ask your dietitian for help.  Use your calories on foods and drinks that will fill you up, and not on foods and drinks that will leave you hungry.  Use smaller plates, glasses, and bowls to prevent overeating. This information is not intended to replace advice given to you by your health care provider. Make sure you discuss any questions you have with your health care provider. Document Revised: 05/11/2018 Document Reviewed: 07/22/2016 Elsevier Patient Education  2020 Elsevier Inc.  

## 2020-01-30 ENCOUNTER — Other Ambulatory Visit: Payer: Self-pay

## 2020-01-30 ENCOUNTER — Ambulatory Visit (INDEPENDENT_AMBULATORY_CARE_PROVIDER_SITE_OTHER): Payer: 59 | Admitting: Certified Nurse Midwife

## 2020-01-30 ENCOUNTER — Encounter: Payer: Self-pay | Admitting: Certified Nurse Midwife

## 2020-01-30 VITALS — BP 105/72 | HR 71 | Ht 68.0 in | Wt 202.6 lb

## 2020-01-30 DIAGNOSIS — Z6830 Body mass index (BMI) 30.0-30.9, adult: Secondary | ICD-10-CM | POA: Diagnosis not present

## 2020-01-30 DIAGNOSIS — R634 Abnormal weight loss: Secondary | ICD-10-CM

## 2020-01-30 DIAGNOSIS — Z7689 Persons encountering health services in other specified circumstances: Secondary | ICD-10-CM

## 2020-01-30 MED ORDER — PHENTERMINE HCL 37.5 MG PO CAPS: 37.5000 mg | ORAL_CAPSULE | ORAL | 2 refills | Status: DC

## 2020-01-30 MED ORDER — CYANOCOBALAMIN 1000 MCG/ML IJ SOLN
1000.0000 ug | Freq: Once | INTRAMUSCULAR | Status: AC
  Administered 2020-01-30: 1000 ug via INTRAMUSCULAR

## 2020-01-30 NOTE — Progress Notes (Signed)
GYN ENCOUNTER NOTE  Subjective:       Jennifer Morse is a 29 y.o. G61P2001 female here for weight management visit.   Total loss of 25 pounds since 11/2019, six (6) pound loss in the last month.   Doing well, no medication side effects.   Denies difficulty breathing or respiratory distress, chest pain, abdominal pain, excessive vaginal bleeding, dysuria, and leg pain or swelling.    Gynecologic History  No LMP recorded. (Menstrual status: IUD).  Contraception: IUD, Mirena  Last Pap: 02/2018. Results were: normal  Obstetric History  OB History  Gravida Para Term Preterm AB Living  2 2 2  0 0 1  SAB TAB Ectopic Multiple Live Births  0 0 0 0 1    # Outcome Date GA Lbr Len/2nd Weight Sex Delivery Anes PTL Lv  2 Term 07/08/17 [redacted]w[redacted]d 15:15 / 00:28 8 lb 2.9 oz (3.71 kg) M Vag-Spont EPI N LIV     Birth Comments: Dimpled cheek (right)  1 Term 07/01/15 [redacted]w[redacted]d 218:30 / 02:26 8 lb 2.9 oz (3.71 kg) F Vag-Spont EPI  LIV    Obstetric Comments  1st Menstrual Cycle:  15   1st Pregnancy:  24    Past Medical History:  Diagnosis Date  . Anxiety   . Frequent headaches   . Gestational thrombocytopenia ([place])   . Migraine with aura, intractable, with status migrainosus 1995  . Oligomenorrhea   . Postpartum depression    with first pregnancy    Past Surgical History:  Procedure Laterality Date  . WISDOM TOOTH EXTRACTION  2012    Current Outpatient Medications on File Prior to Visit  Medication Sig Dispense Refill  . aspirin-acetaminophen-caffeine (EXCEDRIN MIGRAINE) 250-250-65 MG tablet Take by mouth every 6 (six) hours as needed for headache.    Marland Kitchen azelastine (ASTELIN) 0.1 % nasal spray Place 1 spray into both nostrils 2 (two) times daily. Use in each nostril as directed (Patient taking differently: Place 1 spray into both nostrils as needed. Use in each nostril as directed) 30 mL 12  . buPROPion (WELLBUTRIN XL) 150 MG 24 hr tablet TAKE 1 TABLET BY MOUTH DAILY. MAY INCREASE TO 2  TABLETS DAILY AFTER 1 WEEK 60 tablet 5  . cyanocobalamin (,VITAMIN B-12,) 1000 MCG/ML injection     . phentermine 37.5 MG capsule Take 1 capsule (37.5 mg total) by mouth every morning. 30 capsule 2  . SYNTHROID 25 MCG tablet Take one tab daily 30 tablet 5   No current facility-administered medications on file prior to visit.    No Known Allergies  Social History   Socioeconomic History  . Marital status: Married    Spouse name: Edison Nasuti  . Number of children: 2  . Years of education: Not on file  . Highest education level: Not on file  Occupational History  . Occupation: Therapist, sports  Tobacco Use  . Smoking status: Former Smoker    Packs/day: 0.25    Types: Cigarettes    Quit date: 02/26/2015    Years since quitting: 4.9  . Smokeless tobacco: Never Used  Substance and Sexual Activity  . Alcohol use: Yes    Alcohol/week: 1.0 standard drinks    Types: 1 Glasses of wine per week  . Drug use: No  . Sexual activity: Yes    Partners: Male    Birth control/protection: I.U.D.  Other Topics Concern  . Not on file  Social History Narrative  . Not on file   Social Determinants of Health  Financial Resource Strain:   . Difficulty of Paying Living Expenses:   Food Insecurity:   . Worried About Charity fundraiser in the Last Year:   . Arboriculturist in the Last Year:   Transportation Needs:   . Film/video editor (Medical):   Marland Kitchen Lack of Transportation (Non-Medical):   Physical Activity:   . Days of Exercise per Week:   . Minutes of Exercise per Session:   Stress:   . Feeling of Stress :   Social Connections:   . Frequency of Communication with Friends and Family:   . Frequency of Social Gatherings with Friends and Family:   . Attends Religious Services:   . Active Member of Clubs or Organizations:   . Attends Archivist Meetings:   Marland Kitchen Marital Status:   Intimate Partner Violence:   . Fear of Current or Ex-Partner:   . Emotionally Abused:   Marland Kitchen Physically Abused:   .  Sexually Abused:     Family History  Problem Relation Age of Onset  . Diabetes Mother   . Diabetes Sister   . Diabetes Maternal Grandmother   . Heart disease Maternal Grandmother   . Hypertension Maternal Grandmother   . Kidney cancer Maternal Grandmother   . Diabetes Maternal Grandfather   . Heart disease Maternal Grandfather   . Hypertension Maternal Grandfather   . Diabetes Maternal Aunt   . Breast cancer Neg Hx   . Ovarian cancer Neg Hx   . Colon cancer Neg Hx     The following portions of the patient's history were reviewed and updated as appropriate: allergies, current medications, past family history, past medical history, past social history, past surgical history and problem list.  Review of Systems  ROS negative except as noted above. Information obtained from patient.   Objective:   BP 105/72   Pulse 71   Ht 5\' 8"  (1.727 m)   Wt 202 lb 9 oz (91.9 kg)   BMI 30.80 kg/m    CONSTITUTIONAL: Well-developed, well-nourished female in no acute distress.   WAIST CIRCUMFERENCE: 43 inches.    Assessment:   1. BMI 30.0-30.9,adult  2. Encounter for weight management  3. Weight loss due to medication  Plan:   B-12 injection given today, see orders.   Rx Phentermine, see chart.   Reviewed red flag symptoms and when to call.   RTC x 1 month for ANNUAL EXAM and PAP or sooner if needed.     Diona Fanti, CNM [facility], [facility] 01/30/20 2:02 PM

## 2020-01-30 NOTE — Progress Notes (Deleted)
GYN ENCOUNTER NOTE  Subjective:       Jennifer Morse is a 29 y.o. G61P2001 female is here for gynecologic evaluation of the following issues:  1. ***.     Gynecologic History  No LMP recorded. (Menstrual status: IUD).  Contraception: {method:5051}  Last Pap: ***. Results were: {norm/abn:16337}  Obstetric History  OB History  Gravida Para Term Preterm AB Living  2 2 2  0 0 1  SAB TAB Ectopic Multiple Live Births  0 0 0 0 1    # Outcome Date GA Lbr Len/2nd Weight Sex Delivery Anes PTL Lv  2 Term 07/08/17 [redacted]w[redacted]d 15:15 / 00:28 8 lb 2.9 oz (3.71 kg) M Vag-Spont EPI N LIV     Birth Comments: Dimpled cheek (right)  1 Term 07/01/15 [redacted]w[redacted]d 218:30 / 02:26 8 lb 2.9 oz (3.71 kg) F Vag-Spont EPI  LIV    Obstetric Comments  1st Menstrual Cycle:  15   1st Pregnancy:  24    Past Medical History:  Diagnosis Date  . Anxiety   . Frequent headaches   . Gestational thrombocytopenia ([place])   . Migraine with aura, intractable, with status migrainosus 1995  . Oligomenorrhea   . Postpartum depression    with first pregnancy    Past Surgical History:  Procedure Laterality Date  . WISDOM TOOTH EXTRACTION  2012    Current Outpatient Medications on File Prior to Visit  Medication Sig Dispense Refill  . aspirin-acetaminophen-caffeine (EXCEDRIN MIGRAINE) 250-250-65 MG tablet Take by mouth every 6 (six) hours as needed for headache.    Marland Kitchen azelastine (ASTELIN) 0.1 % nasal spray Place 1 spray into both nostrils 2 (two) times daily. Use in each nostril as directed (Patient taking differently: Place 1 spray into both nostrils as needed. Use in each nostril as directed) 30 mL 12  . buPROPion (WELLBUTRIN XL) 150 MG 24 hr tablet TAKE 1 TABLET BY MOUTH DAILY. MAY INCREASE TO 2 TABLETS DAILY AFTER 1 WEEK 60 tablet 5  . cyanocobalamin (,VITAMIN B-12,) 1000 MCG/ML injection     . phentermine 37.5 MG capsule Take 1 capsule (37.5 mg total) by mouth every morning. 30 capsule 2  . SYNTHROID 25 MCG tablet Take  one tab daily 30 tablet 5   No current facility-administered medications on file prior to visit.    No Known Allergies  Social History   Socioeconomic History  . Marital status: Married    Spouse name: Edison Nasuti  . Number of children: 2  . Years of education: Not on file  . Highest education level: Not on file  Occupational History  . Occupation: Therapist, sports  Tobacco Use  . Smoking status: Former Smoker    Packs/day: 0.25    Types: Cigarettes    Quit date: 02/26/2015    Years since quitting: 4.9  . Smokeless tobacco: Never Used  Substance and Sexual Activity  . Alcohol use: Yes    Alcohol/week: 1.0 standard drinks    Types: 1 Glasses of wine per week  . Drug use: No  . Sexual activity: Yes    Partners: Male    Birth control/protection: I.U.D.  Other Topics Concern  . Not on file  Social History Narrative  . Not on file   Social Determinants of Health   Financial Resource Strain:   . Difficulty of Paying Living Expenses:   Food Insecurity:   . Worried About Charity fundraiser in the Last Year:   . [place] in the Last Year:  Transportation Needs:   . Film/video editor (Medical):   Marland Kitchen Lack of Transportation (Non-Medical):   Physical Activity:   . Days of Exercise per Week:   . Minutes of Exercise per Session:   Stress:   . Feeling of Stress :   Social Connections:   . Frequency of Communication with Friends and Family:   . Frequency of Social Gatherings with Friends and Family:   . Attends Religious Services:   . Active Member of Clubs or Organizations:   . Attends Archivist Meetings:   Marland Kitchen Marital Status:   Intimate Partner Violence:   . Fear of Current or Ex-Partner:   . Emotionally Abused:   Marland Kitchen Physically Abused:   . Sexually Abused:     Family History  Problem Relation Age of Onset  . Diabetes Mother   . Diabetes Sister   . Diabetes Maternal Grandmother   . Heart disease Maternal Grandmother   . Hypertension Maternal Grandmother   .  Kidney cancer Maternal Grandmother   . Diabetes Maternal Grandfather   . Heart disease Maternal Grandfather   . Hypertension Maternal Grandfather   . Diabetes Maternal Aunt   . Breast cancer Neg Hx   . Ovarian cancer Neg Hx   . Colon cancer Neg Hx     The following portions of the patient's history were reviewed and updated as appropriate: allergies, current medications, past family history, past medical history, past social history, past surgical history and problem list.  Review of Systems  ROS negative except as noted above. Information obtained from patient.   Objective:   BP 105/72   Pulse 71   Ht 5\' 8"  (1.727 m)   Wt 202 lb 9 oz (91.9 kg)   BMI 30.80 kg/m    CONSTITUTIONAL: Well-developed, well-nourished female in no acute distress.   WAIST CIRCUMFERENCE: 43 inches.    Assessment:   1. BMI 30.0-30.9,adult  2. Encounter for weight management  3. Weight loss due to medication  Plan:   ***

## 2020-02-27 ENCOUNTER — Encounter: Payer: Self-pay | Admitting: Certified Nurse Midwife

## 2020-02-27 ENCOUNTER — Ambulatory Visit (INDEPENDENT_AMBULATORY_CARE_PROVIDER_SITE_OTHER): Payer: 59 | Admitting: Certified Nurse Midwife

## 2020-02-27 ENCOUNTER — Encounter: Payer: 59 | Admitting: Certified Nurse Midwife

## 2020-02-27 ENCOUNTER — Other Ambulatory Visit: Payer: Self-pay

## 2020-02-27 VITALS — BP 105/81 | HR 88 | Ht 68.0 in | Wt 201.4 lb

## 2020-02-27 DIAGNOSIS — T50905A Adverse effect of unspecified drugs, medicaments and biological substances, initial encounter: Secondary | ICD-10-CM

## 2020-02-27 DIAGNOSIS — F419 Anxiety disorder, unspecified: Secondary | ICD-10-CM | POA: Diagnosis not present

## 2020-02-27 DIAGNOSIS — Z01419 Encounter for gynecological examination (general) (routine) without abnormal findings: Secondary | ICD-10-CM

## 2020-02-27 DIAGNOSIS — Z6830 Body mass index (BMI) 30.0-30.9, adult: Secondary | ICD-10-CM | POA: Diagnosis not present

## 2020-02-27 DIAGNOSIS — Z975 Presence of (intrauterine) contraceptive device: Secondary | ICD-10-CM | POA: Diagnosis not present

## 2020-02-27 DIAGNOSIS — Z7689 Persons encountering health services in other specified circumstances: Secondary | ICD-10-CM

## 2020-02-27 DIAGNOSIS — R634 Abnormal weight loss: Secondary | ICD-10-CM | POA: Diagnosis not present

## 2020-02-27 DIAGNOSIS — Z30431 Encounter for routine checking of intrauterine contraceptive device: Secondary | ICD-10-CM

## 2020-02-27 MED ORDER — CYANOCOBALAMIN 1000 MCG/ML IJ SOLN
1000.0000 ug | Freq: Once | INTRAMUSCULAR | Status: AC
  Administered 2020-02-27: 1000 ug via INTRAMUSCULAR

## 2020-02-27 NOTE — Patient Instructions (Signed)
Levonorgestrel intrauterine device (IUD) What is this medicine? LEVONORGESTREL IUD (LEE voe nor jes trel) is a contraceptive (birth control) device. The device is placed inside the uterus by a healthcare professional. It is used to prevent pregnancy. This device can also be used to treat heavy bleeding that occurs during your period. This medicine may be used for other purposes; ask your health care provider or pharmacist if you have questions. COMMON BRAND NAME(S): Minette Headland What should I tell my health care provider before I take this medicine? They need to know if you have any of these conditions:  abnormal Pap smear  cancer of the breast, uterus, or cervix  diabetes  endometritis  genital or pelvic infection now or in the past  have more than one sexual partner or your partner has more than one partner  heart disease  history of an ectopic or tubal pregnancy  immune system problems  IUD in place  liver disease or tumor  problems with blood clots or take blood-thinners  seizures  use intravenous drugs  uterus of unusual shape  vaginal bleeding that has not been explained  an unusual or allergic reaction to levonorgestrel, other hormones, silicone, or polyethylene, medicines, foods, dyes, or preservatives  pregnant or trying to get pregnant  breast-feeding How should I use this medicine? This device is placed inside the uterus by a health care professional. Talk to your pediatrician regarding the use of this medicine in children. Special care may be needed. Overdosage: If you think you have taken too much of this medicine contact a poison control center or emergency room at once. NOTE: This medicine is only for you. Do not share this medicine with others. What if I miss a dose? This does not apply. Depending on the brand of device you have inserted, the device will need to be replaced every 3 to 6 years if you wish to continue using this type  of birth control. What may interact with this medicine? Do not take this medicine with any of the following medications:  amprenavir  bosentan  fosamprenavir This medicine may also interact with the following medications:  aprepitant  armodafinil  barbiturate medicines for inducing sleep or treating seizures  bexarotene  boceprevir  griseofulvin  medicines to treat seizures like carbamazepine, ethotoin, felbamate, oxcarbazepine, phenytoin, topiramate  modafinil  pioglitazone  rifabutin  rifampin  rifapentine  some medicines to treat HIV infection like atazanavir, efavirenz, indinavir, lopinavir, nelfinavir, tipranavir, ritonavir  St. John's wort  warfarin This list may not describe all possible interactions. Give your health care provider a list of all the medicines, herbs, non-prescription drugs, or dietary supplements you use. Also tell them if you smoke, drink alcohol, or use illegal drugs. Some items may interact with your medicine. What should I watch for while using this medicine? Visit your doctor or health care professional for regular check ups. See your doctor if you or your partner has sexual contact with others, becomes HIV positive, or gets a sexual transmitted disease. This product does not protect you against HIV infection (AIDS) or other sexually transmitted diseases. You can check the placement of the IUD yourself by reaching up to the top of your vagina with clean fingers to feel the threads. Do not pull on the threads. It is a good habit to check placement after each menstrual period. Call your doctor right away if you feel more of the IUD than just the threads or if you cannot feel the threads at  all. The IUD may come out by itself. You may become pregnant if the device comes out. If you notice that the IUD has come out use a backup birth control method like condoms and call your health care provider. Using tampons will not change the position of the  IUD and are okay to use during your period. This IUD can be safely scanned with magnetic resonance imaging (MRI) only under specific conditions. Before you have an MRI, tell your healthcare provider that you have an IUD in place, and which type of IUD you have in place. What side effects may I notice from receiving this medicine? Side effects that you should report to your doctor or health care professional as soon as possible:  allergic reactions like skin rash, itching or hives, swelling of the face, lips, or tongue  fever, flu-like symptoms  genital sores  high blood pressure  no menstrual period for 6 weeks during use  pain, swelling, warmth in the leg  pelvic pain or tenderness  severe or sudden headache  signs of pregnancy  stomach cramping  sudden shortness of breath  trouble with balance, talking, or walking  unusual vaginal bleeding, discharge  yellowing of the eyes or skin Side effects that usually do not require medical attention (report to your doctor or health care professional if they continue or are bothersome):  acne  breast pain  change in sex drive or performance  changes in weight  cramping, dizziness, or faintness while the device is being inserted  headache  irregular menstrual bleeding within first 3 to 6 months of use  nausea This list may not describe all possible side effects. Call your doctor for medical advice about side effects. You may report side effects to FDA at 1-800-FDA-1088. Where should I keep my medicine? This does not apply. NOTE: This sheet is a summary. It may not cover all possible information. If you have questions about this medicine, talk to your doctor, pharmacist, or health care provider.  2020 Elsevier/Gold Standard (2018-07-03 13:22:01)   Preventive Care 15-33 Years Old, Female Preventive care refers to visits with your health care provider and lifestyle choices that can promote health and wellness. This  includes:  A yearly physical exam. This may also be called an annual well check.  Regular dental visits and eye exams.  Immunizations.  Screening for certain conditions.  Healthy lifestyle choices, such as eating a healthy diet, getting regular exercise, not using drugs or products that contain nicotine and tobacco, and limiting alcohol use. What can I expect for my preventive care visit? Physical exam Your health care provider will check your:  Height and weight. This may be used to calculate body mass index (BMI), which tells if you are at a healthy weight.  Heart rate and blood pressure.  Skin for abnormal spots. Counseling Your health care provider may ask you questions about your:  Alcohol, tobacco, and drug use.  Emotional well-being.  Home and relationship well-being.  Sexual activity.  Eating habits.  Work and work Statistician.  Method of birth control.  Menstrual cycle.  Pregnancy history. What immunizations do I need?  Influenza (flu) vaccine  This is recommended every year. Tetanus, diphtheria, and pertussis (Tdap) vaccine  You may need a Td booster every 10 years. Varicella (chickenpox) vaccine  You may need this if you have not been vaccinated. Human papillomavirus (HPV) vaccine  If recommended by your health care provider, you may need three doses over 6 months. Measles, mumps, and  rubella (MMR) vaccine  You may need at least one dose of MMR. You may also need a second dose. Meningococcal conjugate (MenACWY) vaccine  One dose is recommended if you are age 74-21 years and a first-year college student living in a residence hall, or if you have one of several medical conditions. You may also need additional booster doses. Pneumococcal conjugate (PCV13) vaccine  You may need this if you have certain conditions and were not previously vaccinated. Pneumococcal polysaccharide (PPSV23) vaccine  You may need one or two doses if you smoke  cigarettes or if you have certain conditions. Hepatitis A vaccine  You may need this if you have certain conditions or if you travel or work in places where you may be exposed to hepatitis A. Hepatitis B vaccine  You may need this if you have certain conditions or if you travel or work in places where you may be exposed to hepatitis B. Haemophilus influenzae type b (Hib) vaccine  You may need this if you have certain conditions. You may receive vaccines as individual doses or as more than one vaccine together in one shot (combination vaccines). Talk with your health care provider about the risks and benefits of combination vaccines. What tests do I need?  Blood tests  Lipid and cholesterol levels. These may be checked every 5 years starting at age 45.  Hepatitis C test.  Hepatitis B test. Screening  Diabetes screening. This is done by checking your blood sugar (glucose) after you have not eaten for a while (fasting).  Sexually transmitted disease (STD) testing.  BRCA-related cancer screening. This may be done if you have a family history of breast, ovarian, tubal, or peritoneal cancers.  Pelvic exam and Pap test. This may be done every 3 years starting at age 51. Starting at age 46, this may be done every 5 years if you have a Pap test in combination with an HPV test. Talk with your health care provider about your test results, treatment options, and if necessary, the need for more tests. Follow these instructions at home: Eating and drinking   Eat a diet that includes fresh fruits and vegetables, whole grains, lean protein, and low-fat dairy.  Take vitamin and mineral supplements as recommended by your health care provider.  Do not drink alcohol if: ? Your health care provider tells you not to drink. ? You are pregnant, may be pregnant, or are planning to become pregnant.  If you drink alcohol: ? Limit how much you have to 0-1 drink a day. ? Be aware of how much alcohol  is in your drink. In the [place] one drink equals one 12 oz bottle of beer (355 mL), one 5 oz glass of wine (148 mL), or one 1 oz glass of hard liquor (44 mL). Lifestyle  Take daily care of your teeth and gums.  Stay active. Exercise for at least 30 minutes on 5 or more days each week.  Do not use any products that contain nicotine or tobacco, such as cigarettes, e-cigarettes, and chewing tobacco. If you need help quitting, ask your health care provider.  If you are sexually active, practice safe sex. Use a condom or other form of birth control (contraception) in order to prevent pregnancy and STIs (sexually transmitted infections). If you plan to become pregnant, see your health care provider for a preconception visit. What's next?  Visit your health care provider once a year for a well check visit.  Ask your health care provider how  often you should have your eyes and teeth checked.  Stay up to date on all vaccines. This information is not intended to replace advice given to you by your health care provider. Make sure you discuss any questions you have with your health care provider. Document Revised: 05/03/2018 Document Reviewed: 05/03/2018 Elsevier Patient Education  2020 Elsevier Inc.  

## 2020-02-27 NOTE — Progress Notes (Signed)
WC=41.5

## 2020-02-27 NOTE — Progress Notes (Signed)
ANNUAL PREVENTATIVE CARE GYN  ENCOUNTER NOTE  Subjective:       Jennifer Morse is a 29 y.o. G63P2001 female here for a routine annual gynecologic exam.    Taking Phetermine for weight loss. Attending boot camp three (3) days per week. Total weight loss of 25 pounds since November 04, 2019.   Denies difficulty breathing or respiratory distress, chest pain, abdominal pain, excessive vaginal bleeding, dysuria, and leg pain or swelling.    Gynecologic History  No LMP recorded. (Menstrual status: IUD).  Contraception: IUD, Mirena  Last Pap: 02/2018. Results were: normal  Obstetric History  OB History  Gravida Para Term Preterm AB Living  2 2 2  0 0 1  SAB TAB Ectopic Multiple Live Births  0 0 0 0 1    # Outcome Date GA Lbr Len/2nd Weight Sex Delivery Anes PTL Lv  2 Term 07/08/17 [redacted]w[redacted]d 15:15 / 00:28 8 lb 2.9 oz (3.71 kg) M Vag-Spont EPI N LIV     Birth Comments: Dimpled cheek (right)  1 Term 07/01/15 [redacted]w[redacted]d 218:30 / 02:26 8 lb 2.9 oz (3.71 kg) F Vag-Spont EPI  LIV    Obstetric Comments  1st Menstrual Cycle:  15   1st Pregnancy:  24    Past Medical History:  Diagnosis Date  . Anxiety   . Frequent headaches   . Gestational thrombocytopenia ([place])   . Migraine with aura, intractable, with status migrainosus 1995  . Oligomenorrhea   . Postpartum depression    with first pregnancy    Past Surgical History:  Procedure Laterality Date  . WISDOM TOOTH EXTRACTION  2012    Current Outpatient Medications on File Prior to Visit  Medication Sig Dispense Refill  . aspirin-acetaminophen-caffeine (EXCEDRIN MIGRAINE) 250-250-65 MG tablet Take by mouth every 6 (six) hours as needed for headache.    Marland Kitchen azelastine (ASTELIN) 0.1 % nasal spray Place 1 spray into both nostrils 2 (two) times daily. Use in each nostril as directed (Patient taking differently: Place 1 spray into both nostrils as needed. Use in each nostril as directed) 30 mL 12  . buPROPion (WELLBUTRIN XL) 150 MG 24 hr tablet TAKE  1 TABLET BY MOUTH DAILY. MAY INCREASE TO 2 TABLETS DAILY AFTER 1 WEEK 60 tablet 5  . cyanocobalamin (,VITAMIN B-12,) 1000 MCG/ML injection     . phentermine 37.5 MG capsule Take 1 capsule (37.5 mg total) by mouth every morning. 30 capsule 2  . SYNTHROID 25 MCG tablet Take one tab daily 30 tablet 5   No current facility-administered medications on file prior to visit.    No Known Allergies  Social History   Socioeconomic History  . Marital status: Married    Spouse name: Edison Nasuti  . Number of children: 2  . Years of education: Not on file  . Highest education level: Not on file  Occupational History  . Occupation: Therapist, sports  Tobacco Use  . Smoking status: Former Smoker    Packs/day: 0.25    Types: Cigarettes    Quit date: 02/26/2015    Years since quitting: 5.0  . Smokeless tobacco: Never Used  Vaping Use  . Vaping Use: Never used  Substance and Sexual Activity  . Alcohol use: Yes    Alcohol/week: 1.0 standard drink    Types: 1 Glasses of wine per week  . Drug use: No  . Sexual activity: Yes    Partners: Male    Birth control/protection: I.U.D.  Other Topics Concern  . Not on file  Social History Narrative  . Not on file   Social Determinants of Health   Financial Resource Strain:   . Difficulty of Paying Living Expenses:   Food Insecurity:   . Worried About Charity fundraiser in the Last Year:   . Arboriculturist in the Last Year:   Transportation Needs:   . Film/video editor (Medical):   Marland Kitchen Lack of Transportation (Non-Medical):   Physical Activity:   . Days of Exercise per Week:   . Minutes of Exercise per Session:   Stress:   . Feeling of Stress :   Social Connections:   . Frequency of Communication with Friends and Family:   . Frequency of Social Gatherings with Friends and Family:   . Attends Religious Services:   . Active Member of Clubs or Organizations:   . Attends Archivist Meetings:   Marland Kitchen Marital Status:   Intimate Partner Violence:   .  Fear of Current or Ex-Partner:   . Emotionally Abused:   Marland Kitchen Physically Abused:   . Sexually Abused:     Family History  Problem Relation Age of Onset  . Diabetes Mother   . Diabetes Sister   . Diabetes Maternal Grandmother   . Heart disease Maternal Grandmother   . Hypertension Maternal Grandmother   . Kidney cancer Maternal Grandmother   . Diabetes Maternal Grandfather   . Heart disease Maternal Grandfather   . Hypertension Maternal Grandfather   . Diabetes Maternal Aunt   . Breast cancer Neg Hx   . Ovarian cancer Neg Hx   . Colon cancer Neg Hx     The following portions of the patient's history were reviewed and updated as appropriate: allergies, current medications, past family history, past medical history, past social history, past surgical history and problem list.  Review of Systems  ROS negative except as noted above. Information obtained from patient.    Objective:   BP 105/81   Pulse 88   Ht 5\' 8"  (1.727 m)   Wt 201 lb 6 oz (91.3 kg)   BMI 30.62 kg/m    CONSTITUTIONAL: Well-developed, well-nourished female in no acute distress.   PSYCHIATRIC: Normal mood and affect. Normal behavior. Normal judgment and thought content.  [place]: Alert and oriented to person, place, and time. Normal muscle tone coordination. No cranial nerve  deficit noted.  HENT:  Normocephalic, atraumatic, External right and left ear normal.   EYES: Conjunctivae and EOM are normal. Pupils are equal and round.   NECK: Normal range of motion, supple, no masses.  Normal thyroid.   SKIN: Skin is warm and dry. No rash noted. Not diaphoretic. No erythema. No pallor. Professional tattoos present.   CARDIOVASCULAR: Normal heart rate noted, regular rhythm, no murmur.  RESPIRATORY: Clear to auscultation bilaterally. Effort and breath sounds normal, no problems with respiration  noted.  BREASTS: Symmetric in size. No masses, skin changes, nipple drainage, or lymphadenopathy.  ABDOMEN:  Soft, normal bowel sounds, no distention noted.  No tenderness, rebound or guarding. Waist circumference 41.5   PELVIC:  External Genitalia: Normal  Vagina: Normal  Cervix: Normal, IUD string present  Uterus: Normal  Adnexa: Normal  MUSCULOSKELETAL: Normal range of motion. No tenderness.  No cyanosis, clubbing, or edema.  2+ distal pulses.  LYMPHATIC: No Axillary, Supraclavicular, or Inguinal Adenopathy.  Depression screen [facility] 2/9 02/27/2020 12/02/2019 11/04/2019 09/04/2015 08/14/2015  Decreased Interest 0 0 2 3 0  Down, Depressed, Hopeless 1 1 1 3 2   PHQ -  2 Score 1 1 3 6 2   Altered sleeping 0 0 1 3 2   Tired, decreased energy 0 0 2 2 1   Change in appetite 0 0 1 1 2   Feeling bad or failure about yourself  1 1 2 3 2   Trouble concentrating 0 0 1 1 0  Moving slowly or fidgety/restless 0 0 1 0 0  Suicidal thoughts 0 0 0 0 0  PHQ-9 Score 2 2 11 16 9   Difficult doing work/chores Somewhat difficult Not difficult at all Somewhat difficult Very difficult Somewhat difficult   Assessment:   Annual gynecologic examination 29 y.o.   Contraception: IUD, Mirena   Obesity 1   Problem List Items Addressed This Visit      Other   BMI 30.0-30.9,adult    Other Visit Diagnoses    Well woman exam    -  Primary   Encounter for weight management       Weight loss due to medication          Plan:   B-12 injection given today, see chart  Pap: Not needed  Labs: Declined   Routine preventative health maintenance measures emphasized: Exercise/Diet/Weight control, Tobacco Warnings, Alcohol/Substance use risks, Stress Management and Peer Pressure Issues; see AVS  Reviewed red flag symptoms and when to call  RTC x 4 weeks for weight management visit  Return to Clinic - 1 Year for [employer] and Pap or sooner if needed   Dani Gobble, CNM  [facility] 02/27/20 5:00 PM

## 2020-03-27 ENCOUNTER — Encounter: Payer: Self-pay | Admitting: Certified Nurse Midwife

## 2020-03-27 ENCOUNTER — Other Ambulatory Visit: Payer: Self-pay

## 2020-03-27 ENCOUNTER — Ambulatory Visit (INDEPENDENT_AMBULATORY_CARE_PROVIDER_SITE_OTHER): Payer: 59 | Admitting: Certified Nurse Midwife

## 2020-03-27 VITALS — BP 105/74 | HR 83 | Ht 68.0 in | Wt 195.5 lb

## 2020-03-27 DIAGNOSIS — R634 Abnormal weight loss: Secondary | ICD-10-CM | POA: Diagnosis not present

## 2020-03-27 DIAGNOSIS — T50905A Adverse effect of unspecified drugs, medicaments and biological substances, initial encounter: Secondary | ICD-10-CM

## 2020-03-27 DIAGNOSIS — Z6830 Body mass index (BMI) 30.0-30.9, adult: Secondary | ICD-10-CM | POA: Diagnosis not present

## 2020-03-27 MED ORDER — CYANOCOBALAMIN 1000 MCG/ML IJ SOLN: 1000.0000 ug | INTRAMUSCULAR | 2 refills | Status: DC

## 2020-03-27 MED ORDER — PHENTERMINE HCL 37.5 MG PO CAPS: 37.5000 mg | ORAL_CAPSULE | ORAL | 2 refills | Status: DC

## 2020-03-27 MED ORDER — CYANOCOBALAMIN 1000 MCG/ML IJ SOLN
1000.0000 ug | Freq: Once | INTRAMUSCULAR | Status: AC
  Administered 2020-03-27: 1000 ug via INTRAMUSCULAR

## 2020-03-27 NOTE — Progress Notes (Signed)
WC= 40.5"

## 2020-03-27 NOTE — Patient Instructions (Signed)

## 2020-03-29 NOTE — Progress Notes (Signed)
GYN ENCOUNTER NOTE  Subjective:       Jennifer Morse is a 29 y.o. [id number] female here for weight management visit.   Six (6) pound weight loss over the last month. Total weight loss of 32 pounds since March 2021.   Wishes to continue phentermine daily and monthly b-12 injections. No negative side effects from medication.   Denies difficulty breathing or respiratory distress, chest pain, abdominal pain, excessive vaginal bleeding, dysuria, and leg pain or swelling.    Gynecologic History  No LMP recorded. (Menstrual status: IUD).  Contraception: diaphragm and IUD  Last Pap: up to date  Obstetric History  OB History  Gravida Para Term Preterm AB Living  2 2 2  0 0 1  SAB TAB Ectopic Multiple Live Births  0 0 0 0 1    # Outcome Date GA Lbr Len/2nd Weight Sex Delivery Anes PTL Lv  2 Term 07/08/17 [redacted]w[redacted]d 15:15 / 00:28 8 lb 2.9 oz (3.71 kg) M Vag-Spont EPI N LIV     Birth Comments: Dimpled cheek (right)  1 Term 07/01/15 [redacted]w[redacted]d 218:30 / 02:26 8 lb 2.9 oz (3.71 kg) F Vag-Spont EPI  LIV    Obstetric Comments  1st Menstrual Cycle:  15   1st Pregnancy:  24    Past Medical History:  Diagnosis Date  . Anxiety   . Frequent headaches   . Gestational thrombocytopenia ([place])   . Migraine with aura, intractable, with status migrainosus 1995  . Oligomenorrhea   . Postpartum depression    with first pregnancy    Past Surgical History:  Procedure Laterality Date  . WISDOM TOOTH EXTRACTION  2012    Current Outpatient Medications on File Prior to Visit  Medication Sig Dispense Refill  . aspirin-acetaminophen-caffeine (EXCEDRIN MIGRAINE) 250-250-65 MG tablet Take by mouth every 6 (six) hours as needed for headache.    Marland Kitchen azelastine (ASTELIN) 0.1 % nasal spray Place 1 spray into both nostrils 2 (two) times daily. Use in each nostril as directed (Patient taking differently: Place 1 spray into both nostrils as needed. Use in each nostril as directed) 30 mL 12  . buPROPion (WELLBUTRIN  XL) 150 MG 24 hr tablet TAKE 1 TABLET BY MOUTH DAILY. MAY INCREASE TO 2 TABLETS DAILY AFTER 1 WEEK 60 tablet 5  . SYNTHROID 25 MCG tablet Take one tab daily 30 tablet 5   No current facility-administered medications on file prior to visit.    No Known Allergies  Social History   Socioeconomic History  . Marital status: Married    Spouse name: Edison Nasuti  . Number of children: 2  . Years of education: Not on file  . Highest education level: Not on file  Occupational History  . Occupation: Therapist, sports  Tobacco Use  . Smoking status: Former Smoker    Packs/day: 0.25    Types: Cigarettes    Quit date: 02/26/2015    Years since quitting: 5.0  . Smokeless tobacco: Never Used  Vaping Use  . Vaping Use: Never used  Substance and Sexual Activity  . Alcohol use: Yes    Alcohol/week: 1.0 standard drink    Types: 1 Glasses of wine per week  . Drug use: No  . Sexual activity: Yes    Partners: Male    Birth control/protection: I.U.D.  Other Topics Concern  . Not on file  Social History Narrative  . Not on file   Social Determinants of Health   Financial Resource Strain:   . Difficulty of Paying  Living Expenses:   Food Insecurity:   . Worried About Charity fundraiser in the Last Year:   . Arboriculturist in the Last Year:   Transportation Needs:   . Film/video editor (Medical):   Marland Kitchen Lack of Transportation (Non-Medical):   Physical Activity:   . Days of Exercise per Week:   . Minutes of Exercise per Session:   Stress:   . Feeling of Stress :   Social Connections:   . Frequency of Communication with Friends and Family:   . Frequency of Social Gatherings with Friends and Family:   . Attends Religious Services:   . Active Member of Clubs or Organizations:   . Attends Archivist Meetings:   Marland Kitchen Marital Status:   Intimate Partner Violence:   . Fear of Current or Ex-Partner:   . Emotionally Abused:   Marland Kitchen Physically Abused:   . Sexually Abused:     Family History  Problem  Relation Age of Onset  . Diabetes Mother   . Diabetes Sister   . Diabetes Maternal Grandmother   . Heart disease Maternal Grandmother   . Hypertension Maternal Grandmother   . Kidney cancer Maternal Grandmother   . Diabetes Maternal Grandfather   . Heart disease Maternal Grandfather   . Hypertension Maternal Grandfather   . Diabetes Maternal Aunt   . Breast cancer Neg Hx   . Ovarian cancer Neg Hx   . Colon cancer Neg Hx     The following portions of the patient's history were reviewed and updated as appropriate: allergies, current medications, past family history, past medical history, past social history, past surgical history and problem list.  Review of Systems  ROS negative except as noted above. Information obtained from patient.   Objective:   BP 105/74   Pulse 83   Ht 5\' 8"  (1.727 m)   Wt 195 lb 8 oz (88.7 kg)   BMI 29.73 kg/m    CONSTITUTIONAL: Well-developed, well-nourished female in no acute distress.   ABDOMEN: Waist circumference 40.5 inches.    Assessment:   1. BMI 30.0-30.9,adult   Plan:   Rx Phentermine and B-12, see orders.   Reviewed red flag symptoms and when to call.   RTC x 2 months for weight management visit or sooner if needed.    Dani Gobble, CNM [facility], [facility]

## 2020-05-04 ENCOUNTER — Ambulatory Visit (INDEPENDENT_AMBULATORY_CARE_PROVIDER_SITE_OTHER): Payer: 59

## 2020-05-04 ENCOUNTER — Ambulatory Visit
Admission: EM | Admit: 2020-05-04 | Discharge: 2020-05-04 | Disposition: A | Discharge status: Home / Self Care | Payer: 59

## 2020-05-04 ENCOUNTER — Other Ambulatory Visit: Payer: Self-pay

## 2020-05-04 DIAGNOSIS — M533 Sacrococcygeal disorders, not elsewhere classified: Secondary | ICD-10-CM | POA: Diagnosis not present

## 2020-05-04 DIAGNOSIS — M545 Low back pain, unspecified: Secondary | ICD-10-CM

## 2020-05-04 MED ORDER — KETOROLAC TROMETHAMINE 60 MG/2ML IM SOLN
60.0000 mg | Freq: Once | INTRAMUSCULAR | Status: AC
  Administered 2020-05-04: 60 mg via INTRAMUSCULAR

## 2020-05-04 MED ORDER — OXYCODONE-ACETAMINOPHEN 5-325 MG PO TABS: 2.0000 | ORAL_TABLET | ORAL | 0 refills | Status: DC | PRN

## 2020-05-04 NOTE — ED Triage Notes (Signed)
Pt with 3.5 weeks of low back pain. Starts on spine and goes from lumbar area to coccyx. No know injury. Has tried Flexeril, TENS unit, Ibuprofen. Prednisone helped somewhat but when she stopped it, it returned.

## 2020-05-04 NOTE — ED Provider Notes (Signed)
[facility]    CSN: [id number] Arrival date & time: 05/04/20  1817      History   Chief Complaint Chief Complaint  Patient presents with  . Back Pain    HPI Jennifer Morse is a 29 y.o. female worse central lumbar/sacral area x 4 days. Had it 3.5 weeks ago and was fine for 2-3 days. Does not radiate to her legs. Has been sleeping on her recliner since moving provoked sharp pain to goes to gluteal fold. Has taken some left over flexeril and prednisone from past times and has not helped. Tylenol and Ibuprofen has not helped. Has used ice and heat which only helps while its on her. Pain is worse with palpation and barring down. She fell on her tail bone 5 years ago on ice but never had it xray'd and felt she healed and on occasion would have pains, but would resolve.      Past Medical History:  Diagnosis Date  . Anxiety   . Frequent headaches   . Gestational thrombocytopenia ([place])   . Migraine with aura, intractable, with status migrainosus 1995  . Oligomenorrhea   . Postpartum depression    with first pregnancy    Patient Active Problem List   Diagnosis Date Noted  . IUD (intrauterine device) in place 02/27/2020  . BMI 30.0-30.9,adult 08/06/2018  . External thrombosed hemorrhoids 05/04/2017  . 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 Migraine 12/13/2013    Past Surgical History:  Procedure Laterality Date  . WISDOM TOOTH EXTRACTION  2012    OB History    Gravida  2   Para  2   Term  2   Preterm  0   AB  0   Living  1     SAB  0   TAB  0   Ectopic  0   Multiple  0   Live Births  1        Obstetric Comments  1st Menstrual Cycle:  15  1st Pregnancy:  24          Home Medications    Prior to Admission medications   Medication Sig Start Date End Date Taking? Authorizing Provider  levonorgestrel (MIRENA) 20 MCG/24HR IUD 1 each by Intrauterine route once.   Yes [provider]  aspirin-acetaminophen-caffeine (EXCEDRIN  MIGRAINE) [phone] MG tablet Take by mouth every 6 (six) hours as needed for headache.    [provider]  buPROPion (WELLBUTRIN XL) 150 MG 24 hr tablet TAKE 1 TABLET BY MOUTH DAILY. MAY INCREASE TO 2 TABLETS DAILY AFTER 1 WEEK 12/02/19   Lawhorn, Lara Mulch, CNM  cyanocobalamin (,VITAMIN B-12,) 1000 MCG/ML injection Inject 1 mL (1,000 mcg total) into the muscle every 30 (thirty) days. 03/27/20   Lawhorn, Lara Mulch, CNM  oxyCODONE-acetaminophen (PERCOCET/ROXICET) 5-325 MG tablet Take 2 tablets by mouth every 4 (four) hours as needed for severe pain. 05/04/20   Rodriguez-Southworth, Sunday Spillers, PA-C  phentermine 37.5 MG capsule Take 1 capsule (37.5 mg total) by mouth every morning. 03/27/20   Diona Fanti, CNM  SYNTHROID 25 MCG tablet Take one tab daily 12/18/18   Dalia Heading, CNM  azelastine (ASTELIN) 0.1 % nasal spray Place 1 spray into both nostrils 2 (two) times daily. Use in each nostril as directed Patient taking differently: Place 1 spray into both nostrils as needed. Use in each nostril as directed 07/31/18 05/04/20  Darlin Priestly, PA-C    Family History Family History  Problem Relation Age of  Onset  . Diabetes Mother   . Diabetes Sister   . Diabetes Maternal Grandmother   . Heart disease Maternal Grandmother   . Hypertension Maternal Grandmother   . Kidney cancer Maternal Grandmother   . Diabetes Maternal Grandfather   . Heart disease Maternal Grandfather   . Hypertension Maternal Grandfather   . Diabetes Maternal Aunt   . Breast cancer Neg Hx   . Ovarian cancer Neg Hx   . Colon cancer Neg Hx     Social History Social History   Tobacco Use  . Smoking status: Former Smoker    Packs/day: 0.25    Types: Cigarettes    Quit date: 02/26/2015    Years since quitting: 5.1  . Smokeless tobacco: Never Used  Vaping Use  . Vaping Use: Never used  Substance Use Topics  . Alcohol use: Yes    Alcohol/week: 1.0 standard drink    Types: 1 Glasses  of wine per week  . Drug use: No     Allergies   Patient has no known allergies.   Review of Systems Review of Systems  Constitutional: Positive for activity change. Negative for appetite change and fever.  Gastrointestinal: Negative for abdominal pain, diarrhea, nausea and vomiting.  Genitourinary: Negative for difficulty urinating, dysuria, frequency, pelvic pain and urgency.  Musculoskeletal: Positive for back pain and gait problem.  Skin: Negative for rash and wound.  Neurological: Negative for weakness and numbness.     Physical Exam Triage Vital Signs ED Triage Vitals  Enc Vitals Group     BP 05/04/20 1927 126/77     Pulse Rate 05/04/20 1927 74     Resp 05/04/20 1927 16     Temp 05/04/20 1927 98.3 F (36.8 C)     Temp Source 05/04/20 1927 Oral     SpO2 05/04/20 1927 100 %     Weight 05/04/20 1930 189 lb (85.7 kg)     Height 05/04/20 1930 5\' 8"  (1.727 m)     Head Circumference --      Peak Flow --      Pain Score 05/04/20 1927 8     Pain Loc --      Pain Edu? --      Excl. in [place]? --    No data found.  Updated Vital Signs BP 126/77 (BP Location: Left Arm)   Pulse 74   Temp 98.3 F (36.8 C) (Oral)   Resp 16   Ht 5\' 8"  (1.727 m)   Wt 189 lb (85.7 kg)   SpO2 100%   BMI 28.74 kg/m   Visual Acuity Right Eye Distance:   Left Eye Distance:   Bilateral Distance:    Right Eye Near:   Left Eye Near:    Bilateral Near:     Physical Exam Constitutional:      General: She is in acute distress.     Appearance: She is obese.     Comments: Is in severe pain  Pulmonary:     Effort: Pulmonary effort is normal.  Musculoskeletal:        General: Normal range of motion.     Cervical back: Neck supple.     Comments: BACK- Has decreased ROM of spine due to severe pain. Has point tenderness of coccyx and lower lumbar region to L5 on vertebral region, not so much on muscular area. Neg SLR  Skin:    General: Skin is warm and dry.     Findings: No bruising,  erythema  or rash.  Neurological:     Mental Status: She is alert and oriented to person, place, and time.  Psychiatric:        Mood and Affect: Mood normal.        Behavior: Behavior normal.        Thought Content: Thought content normal.        Judgment: Judgment normal.     UC Treatments / Results  Labs (all labs ordered are listed, but only abnormal results are displayed) Labs Reviewed - No data to display  EKG   Radiology No results found.  Procedures Procedures (including critical care time)  Medications Ordered in UC Medications  ketorolac (TORADOL) injection 60 mg (60 mg Intramuscular Given 05/04/20 2027)    Initial Impression / Assessment and Plan / UC Course  I have reviewed the triage vital signs and the nursing notes. She was given Toradol 60 mg IM and did not help her pain.  I prescribed her Percocet for pain and she will not work for the next 2 days since she has her shift covered and see if she improves. She was advised to make apt with her PCP this week for Fu specially if she does not improve.  Pertinent imaging results that were available during my care of the patient were reviewed by me and considered in my medical decision making (see chart for details).   Final Clinical Impressions(s) / UC Diagnoses   Final diagnoses:  Acute midline low back pain without sciatica  Coccydynia   Discharge Instructions   None    ED Prescriptions    Medication Sig Dispense Auth. Provider   oxyCODONE-acetaminophen (PERCOCET/ROXICET) 5-325 MG tablet Take 2 tablets by mouth every 4 (four) hours as needed for severe pain. 6 tablet Rodriguez-Southworth, Sunday Spillers, PA-C     I have reviewed the PDMP during this encounter.   Shelby Mattocks, [place] 05/07/20 [phone]

## 2020-05-07 ENCOUNTER — Other Ambulatory Visit: Payer: Self-pay | Admitting: Certified Nurse Midwife

## 2020-05-07 DIAGNOSIS — M545 Low back pain, unspecified: Secondary | ICD-10-CM

## 2020-05-07 NOTE — Progress Notes (Signed)
Referral to Sports Medicine, see orders.    Dani Gobble, CNM [facility], [facility] 05/07/20 6:40 PM

## 2020-05-13 ENCOUNTER — Other Ambulatory Visit: Payer: Self-pay

## 2020-05-13 ENCOUNTER — Ambulatory Visit (INDEPENDENT_AMBULATORY_CARE_PROVIDER_SITE_OTHER): Payer: 59 | Admitting: Family Medicine

## 2020-05-13 ENCOUNTER — Other Ambulatory Visit: Payer: Self-pay | Admitting: Family Medicine

## 2020-05-13 ENCOUNTER — Encounter: Payer: Self-pay | Admitting: Family Medicine

## 2020-05-13 VITALS — BP 110/80 | HR 76 | Ht 68.0 in | Wt 190.0 lb

## 2020-05-13 DIAGNOSIS — M2550 Pain in unspecified joint: Secondary | ICD-10-CM

## 2020-05-13 DIAGNOSIS — G8929 Other chronic pain: Secondary | ICD-10-CM

## 2020-05-13 DIAGNOSIS — M545 Low back pain, unspecified: Secondary | ICD-10-CM

## 2020-05-13 DIAGNOSIS — M5416 Radiculopathy, lumbar region: Secondary | ICD-10-CM | POA: Diagnosis not present

## 2020-05-13 MED ORDER — GABAPENTIN 100 MG PO CAPS: 200.0000 mg | ORAL_CAPSULE | Freq: Every day | ORAL | 3 refills | Status: DC

## 2020-05-13 MED ORDER — TIZANIDINE HCL 4 MG PO TABS: 4.0000 mg | ORAL_TABLET | Freq: Every day | ORAL | 1 refills | Status: DC

## 2020-05-13 NOTE — Assessment & Plan Note (Signed)
Patient does have more of a lumbar radiculopathy.  Discussed with patient in great length.  Patient's pain seems to be worsening and does have radicular symptoms with 20 degrees of forward flexion.  Patient has not responded well to the prednisone, oral anti-inflammatories, or muscle relaxer, discontinued Flexeril and started Zanaflex, starting gabapentin.  Because of the radicular symptoms and worsening pain over the course the last several weeks and significantly worse over the last week I would like to get an MRI of the back.  Without anything such as a herniated disc that could be contributing or an occult fracture.  Patient is usually very active.  Seems extremely uncomfortable today, has Percocet from another provider and can use sparingly if necessary.

## 2020-05-13 NOTE — Patient Instructions (Addendum)
Good to see you Labs today MRI lumbar Zanaflex and gabapentin Stop flexeril  Will talk after MRI

## 2020-05-13 NOTE — Progress Notes (Signed)
[facility] [address] [place] Phone: [phone] Subjective:   I [name] am serving as a Education administrator for Dr. Hulan Saas.  This visit occurred during the SARS-CoV-2 public health emergency.  Safety protocols were in place, including screening questions prior to the visit, additional usage of staff PPE, and extensive cleaning of exam room while observing appropriate contact time as indicated for disinfecting solutions.   I'm seeing this patient by the request  of:  Jennifer Morse, Lara Mulch, CNM  CC: Low back pain that is worsening  [id number]  Jennifer Morse is a 29 y.o. female coming in with complaint of low back pain. 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 Has had the issue before and took prednisone for it. Patient is stiff. TTP. Sitting and standing while applying pressure helps the pain. Patient states that she is usually active but can't be as active as she like due to pain.   Onset- Chronic, 1.5 weeks ago  Location - low back, SI joint Duration- consistent all day Character- sharp, shooting  Aggravating factors- working on her feet, turning in the bed, picking her kids up  Reliving factors-  Therapies tried- flexeril, percocet, prednisone, heat, ice, Epson salt bath, stretching Severity- 8-8.5/10 at its worse    Patient did have x-rays taken May 04, 2020.  These were independently visualized by me no significant arthritic changes of the lumbar spine or the sacrum area.  Past Medical History:  Diagnosis Date  . Anxiety   . Frequent headaches   . Gestational thrombocytopenia ([place])   . Migraine with aura, intractable, with status migrainosus 1995  . Oligomenorrhea   . Postpartum depression    with first pregnancy   Past Surgical History:  Procedure Laterality Date  . WISDOM TOOTH EXTRACTION  2012   Social History   Socioeconomic History  . Marital status: Married    Spouse name: Jennifer Morse  .  Number of children: 2  . Years of education: Not on file  . Highest education level: Not on file  Occupational History  . Occupation: Therapist, sports  Tobacco Use  . Smoking status: Former Smoker    Packs/day: 0.25    Types: Cigarettes    Quit date: 02/26/2015    Years since quitting: 5.2  . Smokeless tobacco: Never Used  Vaping Use  . Vaping Use: Never used  Substance and Sexual Activity  . Alcohol use: Yes    Alcohol/week: 1.0 standard drink    Types: 1 Glasses of wine per week  . Drug use: No  . Sexual activity: Yes    Partners: Male    Birth control/protection: I.U.D.  Other Topics Concern  . Not on file  Social History Narrative  . Not on file   Social Determinants of Health   Financial Resource Strain:   . Difficulty of Paying Living Expenses: Not on file  Food Insecurity:   . Worried About Charity fundraiser in the Last Year: Not on file  . Ran Out of Food in the Last Year: Not on file  Transportation Needs:   . Lack of Transportation (Medical): Not on file  . Lack of Transportation (Non-Medical): Not on file  Physical Activity:   . Days of Exercise per Week: Not on file  . Minutes of Exercise per Session: Not on file  Stress:   . Feeling of Stress : Not on file  Social Connections:   . Frequency of Communication with Friends  and Family: Not on file  . Frequency of Social Gatherings with Friends and Family: Not on file  . Attends Religious Services: Not on file  . Active Member of Clubs or Organizations: Not on file  . Attends Archivist Meetings: Not on file  . Marital Status: Not on file   No Known Allergies Family History  Problem Relation Age of Onset  . Diabetes Mother   . Diabetes Sister   . Diabetes Maternal Grandmother   . Heart disease Maternal Grandmother   . Hypertension Maternal Grandmother   . Kidney cancer Maternal Grandmother   . Diabetes Maternal Grandfather   . Heart disease Maternal Grandfather   . Hypertension Maternal Grandfather     . Diabetes Maternal Aunt   . Breast cancer Neg Hx   . Ovarian cancer Neg Hx   . Colon cancer Neg Hx     Current Outpatient Medications (Endocrine & Metabolic):  .  levonorgestrel (MIRENA) 20 MCG/24HR IUD, 1 each by Intrauterine route once. Marland Kitchen  SYNTHROID 25 MCG tablet, Take one tab daily    Current Outpatient Medications (Analgesics):  .  aspirin-acetaminophen-caffeine (EXCEDRIN MIGRAINE) 250-250-65 MG tablet, Take by mouth every 6 (six) hours as needed for headache. .  oxyCODONE-acetaminophen (PERCOCET/ROXICET) 5-325 MG tablet, Take 2 tablets by mouth every 4 (four) hours as needed for severe pain.  Current Outpatient Medications (Hematological):  .  cyanocobalamin (,VITAMIN B-12,) 1000 MCG/ML injection, Inject 1 mL (1,000 mcg total) into the muscle every 30 (thirty) days.  Current Outpatient Medications (Other):  Marland Kitchen  buPROPion (WELLBUTRIN XL) 150 MG 24 hr tablet, TAKE 1 TABLET BY MOUTH DAILY. MAY INCREASE TO 2 TABLETS DAILY AFTER 1 WEEK .  phentermine 37.5 MG capsule, Take 1 capsule (37.5 mg total) by mouth every morning. .  gabapentin (NEURONTIN) 100 MG capsule, Take 2 capsules (200 mg total) by mouth at bedtime. Marland Kitchen  tiZANidine (ZANAFLEX) 4 MG tablet, Take 1 tablet (4 mg total) by mouth at bedtime.   Reviewed prior external information including notes and imaging from  primary care provider As well as notes that were available from care everywhere and other healthcare systems.  Past medical history, social, surgical and family history all reviewed in electronic medical record.  No pertanent information unless stated regarding to the chief complaint.   Review of Systems:  No headache, visual changes, nausea, vomiting, diarrhea, constipation, dizziness, abdominal pain, skin rash, fevers, chills, night sweats, weight loss, swollen lymph nodes,joint swelling, chest pain, shortness of breath, mood changes. POSITIVE muscle aches, body aches  Objective  Blood pressure 110/80, pulse 76,  height 5\' 8"  (1.727 m), weight 190 lb (86.2 kg), SpO2 98 %.   General: Alert and oriented x3 but he does seem significantly uncomfortable HEENT: Pupils equal, extraocular movements intact  Respiratory: Patient's speak in full sentences and does not appear short of breath  Cardiovascular: No lower extremity edema, non tender, no erythema  Neuro: Cranial nerves II through XII are intact, neurovascularly intact in all extremities with 2+ DTRs and 2+ pulses.  Gait severely guarded Patient's low back does have significant tightness noted on exam today.  Positive straight leg test with forward flexion of 20 degrees with radicular symptoms in the L5 correspondence.  Worsening pain with any type of flexion or extension of the back noted.  Unfortunately significant on a voluntary guarding and patient does appear tearful.  Deep tendon reflexes though are intact.    Impression and Recommendations:     The above documentation  has been reviewed and is accurate and complete Jennifer Serviss M Cataldo Cosgriff, DO       Note: This dictation was prepared with Dragon dictation along with smaller phrase technology. Any transcriptional errors that result from this process are unintentional.         

## 2020-05-15 ENCOUNTER — Encounter: Payer: Self-pay | Admitting: Family Medicine

## 2020-05-15 LAB — CBC WITH DIFFERENTIAL/PLATELET
Absolute Monocytes: 477 cells/uL (ref 200–950)
Basophils Absolute: 42 cells/uL (ref 0–200)
Basophils Relative: 0.8 %
Eosinophils Absolute: 69 cells/uL (ref 15–500)
Eosinophils Relative: 1.3 %
HCT: 41.7 % (ref 35.0–45.0)
Hemoglobin: 13.8 g/dL (ref 11.7–15.5)
Lymphs Abs: 1638 cells/uL (ref 850–3900)
MCH: 29.5 pg (ref 27.0–33.0)
MCHC: 33.1 g/dL (ref 32.0–36.0)
MCV: 89.1 fL (ref 80.0–100.0)
MPV: 12.5 fL (ref 7.5–12.5)
Monocytes Relative: 9 %
Neutro Abs: 3074 cells/uL (ref 1500–7800)
Neutrophils Relative %: 58 %
Platelets: 163 10*3/uL (ref 140–400)
RBC: 4.68 10*6/uL (ref 3.80–5.10)
RDW: 12.1 % (ref 11.0–15.0)
Total Lymphocyte: 30.9 %
WBC: 5.3 10*3/uL (ref 3.8–10.8)

## 2020-05-15 LAB — COMPREHENSIVE METABOLIC PANEL
AG Ratio: 1.9 (calc) (ref 1.0–2.5)
ALT: 22 U/L (ref 6–29)
AST: 15 U/L (ref 10–30)
Albumin: 4.6 g/dL (ref 3.6–5.1)
Alkaline phosphatase (APISO): 68 U/L (ref 31–125)
BUN: 11 mg/dL (ref 7–25)
CO2: 24 mmol/L (ref 20–32)
Calcium: 9.3 mg/dL (ref 8.6–10.2)
Chloride: 103 mmol/L (ref 98–110)
Creat: 1.02 mg/dL (ref 0.50–1.10)
Globulin: 2.4 g/dL (calc) (ref 1.9–3.7)
Glucose, Bld: 78 mg/dL (ref 65–99)
Potassium: 4.1 mmol/L (ref 3.5–5.3)
Sodium: 139 mmol/L (ref 135–146)
Total Bilirubin: 0.4 mg/dL (ref 0.2–1.2)
Total Protein: 7 g/dL (ref 6.1–8.1)

## 2020-05-15 LAB — ANA: Anti Nuclear Antibody (ANA): NEGATIVE

## 2020-05-15 LAB — URIC ACID: Uric Acid, Serum: 6.2 mg/dL (ref 2.5–7.0)

## 2020-05-15 LAB — ANGIOTENSIN CONVERTING ENZYME: Angiotensin-Converting Enzyme: 37 U/L (ref 9–67)

## 2020-05-15 LAB — TRANSFERRIN: Transferrin: 250 mg/dL (ref 188–341)

## 2020-05-15 LAB — FERRITIN: Ferritin: 71 ng/mL (ref 16–154)

## 2020-05-15 LAB — PTH, INTACT AND CALCIUM
Calcium: 9.3 mg/dL (ref 8.6–10.2)
PTH: 62 pg/mL (ref 14–64)

## 2020-05-15 LAB — C-REACTIVE PROTEIN: CRP: 2.5 mg/L (ref <8.0)

## 2020-05-15 LAB — RHEUMATOID FACTOR: Rheumatoid fact SerPl-aCnc: <14 IU/mL (ref <14)

## 2020-05-15 LAB — SEDIMENTATION RATE: Sed Rate: 2 mm/h (ref 0–20)

## 2020-05-15 LAB — CYCLIC CITRUL PEPTIDE ANTIBODY, IGG: Cyclic Citrullin Peptide Ab: <16 UNITS

## 2020-05-15 LAB — CALCIUM, IONIZED: Calcium, Ion: 5.08 mg/dL (ref 4.8–5.6)

## 2020-05-15 LAB — VITAMIN D 25 HYDROXY (VIT D DEFICIENCY, FRACTURES): Vit D, 25-Hydroxy: 40 ng/mL (ref 30–100)

## 2020-05-15 LAB — TSH: TSH: 2.4 mIU/L

## 2020-05-21 ENCOUNTER — Ambulatory Visit
Admission: RE | Admit: 2020-05-21 | Discharge: 2020-05-21 | Disposition: A | Discharge status: Home / Self Care | Payer: 59 | Source: Ambulatory Visit | Attending: Family Medicine | Admitting: Family Medicine

## 2020-05-21 ENCOUNTER — Other Ambulatory Visit: Payer: Self-pay

## 2020-05-21 DIAGNOSIS — M48061 Spinal stenosis, lumbar region without neurogenic claudication: Secondary | ICD-10-CM | POA: Diagnosis not present

## 2020-05-21 DIAGNOSIS — G8929 Other chronic pain: Secondary | ICD-10-CM

## 2020-05-22 ENCOUNTER — Other Ambulatory Visit: Payer: Self-pay

## 2020-05-22 ENCOUNTER — Encounter: Payer: Self-pay | Admitting: Family Medicine

## 2020-05-22 DIAGNOSIS — M5416 Radiculopathy, lumbar region: Secondary | ICD-10-CM

## 2020-05-25 ENCOUNTER — Encounter: Payer: Self-pay | Admitting: Certified Nurse Midwife

## 2020-05-25 ENCOUNTER — Other Ambulatory Visit: Payer: Self-pay

## 2020-05-25 ENCOUNTER — Ambulatory Visit (INDEPENDENT_AMBULATORY_CARE_PROVIDER_SITE_OTHER): Payer: 59 | Admitting: Certified Nurse Midwife

## 2020-05-25 VITALS — BP 110/85 | HR 75 | Ht 68.0 in | Wt 191.5 lb

## 2020-05-25 DIAGNOSIS — Z6829 Body mass index (BMI) 29.0-29.9, adult: Secondary | ICD-10-CM | POA: Diagnosis not present

## 2020-05-25 DIAGNOSIS — R634 Abnormal weight loss: Secondary | ICD-10-CM

## 2020-05-25 DIAGNOSIS — M545 Low back pain: Secondary | ICD-10-CM | POA: Diagnosis not present

## 2020-05-25 DIAGNOSIS — Z0130 Encounter for examination of blood pressure without abnormal findings: Secondary | ICD-10-CM

## 2020-05-25 DIAGNOSIS — Z7689 Persons encountering health services in other specified circumstances: Secondary | ICD-10-CM | POA: Diagnosis not present

## 2020-05-25 DIAGNOSIS — M62830 Muscle spasm of back: Secondary | ICD-10-CM | POA: Diagnosis not present

## 2020-05-25 DIAGNOSIS — T50905A Adverse effect of unspecified drugs, medicaments and biological substances, initial encounter: Secondary | ICD-10-CM

## 2020-05-25 DIAGNOSIS — M9903 Segmental and somatic dysfunction of lumbar region: Secondary | ICD-10-CM | POA: Diagnosis not present

## 2020-05-25 MED ORDER — CYANOCOBALAMIN 1000 MCG/ML IJ SOLN
1000.0000 ug | Freq: Once | INTRAMUSCULAR | Status: AC
  Administered 2020-05-25: 1000 ug via INTRAMUSCULAR

## 2020-05-25 NOTE — Patient Instructions (Signed)

## 2020-05-26 NOTE — Progress Notes (Signed)
GYN ENCOUNTER NOTE  Subjective:       Jennifer Morse is a 29 y.o. G5P2001 female here for weight management visit.   Since last visit, patient has been seeing Dr. Tamala Julian for management of back pain; see notes.   Even with decreased activity, notes five (5) pound weight loss in last two (2) months.   Wishes to continue phentermine. Denies adverse medication reactions.    Gynecologic History  No LMP recorded. (Menstrual status: IUD).  Contraception: IUD, Mirena  Last Pap: up to date  Obstetric History  OB History  Gravida Para Term Preterm AB Living  2 2 2  0 0 1  SAB TAB Ectopic Multiple Live Births  0 0 0 0 1    # Outcome Date GA Lbr Len/2nd Weight Sex Delivery Anes PTL Lv  2 Term 07/08/17 [redacted]w[redacted]d 15:15 / 00:28 8 lb 2.9 oz (3.71 kg) M Vag-Spont EPI N LIV     Birth Comments: Dimpled cheek (right)  1 Term 07/01/15 [redacted]w[redacted]d 218:30 / 02:26 8 lb 2.9 oz (3.71 kg) F Vag-Spont EPI  LIV    Obstetric Comments  1st Menstrual Cycle:  15   1st Pregnancy:  24    Past Medical History:  Diagnosis Date  . Anxiety   . Degenerative lumbar disc   . Frequent headaches   . Gestational thrombocytopenia ([place])   . Lumbar herniated disc   . Migraine with aura, intractable, with status migrainosus 1995  . Oligomenorrhea   . Postpartum depression    with first pregnancy    Past Surgical History:  Procedure Laterality Date  . WISDOM TOOTH EXTRACTION  2012    Current Outpatient Medications on File Prior to Visit  Medication Sig Dispense Refill  . aspirin-acetaminophen-caffeine (EXCEDRIN MIGRAINE) 250-250-65 MG tablet Take by mouth every 6 (six) hours as needed for headache.    Marland Kitchen buPROPion (WELLBUTRIN XL) 150 MG 24 hr tablet TAKE 1 TABLET BY MOUTH DAILY. MAY INCREASE TO 2 TABLETS DAILY AFTER 1 WEEK 60 tablet 5  . cyanocobalamin (,VITAMIN B-12,) 1000 MCG/ML injection Inject 1 mL (1,000 mcg total) into the muscle every 30 (thirty) days. 1 mL 2  . gabapentin (NEURONTIN) 100 MG capsule Take 2  capsules (200 mg total) by mouth at bedtime. 180 capsule 3  . levonorgestrel (MIRENA) 20 MCG/24HR IUD 1 each by Intrauterine route once.    . phentermine 37.5 MG capsule Take 1 capsule (37.5 mg total) by mouth every morning. 30 capsule 2  . SYNTHROID 25 MCG tablet Take one tab daily 30 tablet 5  . tiZANidine (ZANAFLEX) 4 MG tablet Take 1 tablet (4 mg total) by mouth at bedtime. 30 tablet 1  . [DISCONTINUED] azelastine (ASTELIN) 0.1 % nasal spray Place 1 spray into both nostrils 2 (two) times daily. Use in each nostril as directed (Patient taking differently: Place 1 spray into both nostrils as needed. Use in each nostril as directed) 30 mL 12   No current facility-administered medications on file prior to visit.    No Known Allergies  Social History   Socioeconomic History  . Marital status: Married    Spouse name: Edison Nasuti  . Number of children: 2  . Years of education: Not on file  . Highest education level: Not on file  Occupational History  . Occupation: Therapist, sports  Tobacco Use  . Smoking status: Former Smoker    Packs/day: 0.25    Types: Cigarettes    Quit date: 02/26/2015    Years since quitting: 5.2  .  Smokeless tobacco: Never Used  Vaping Use  . Vaping Use: Never used  Substance and Sexual Activity  . Alcohol use: Not Currently    Alcohol/week: 1.0 standard drink    Types: 1 Glasses of wine per week  . Drug use: No  . Sexual activity: Yes    Partners: Male    Birth control/protection: I.U.D.  Other Topics Concern  . Not on file  Social History Narrative  . Not on file   Social Determinants of Health   Financial Resource Strain:   . Difficulty of Paying Living Expenses: Not on file  Food Insecurity:   . Worried About Charity fundraiser in the Last Year: Not on file  . Ran Out of Food in the Last Year: Not on file  Transportation Needs:   . Lack of Transportation (Medical): Not on file  . Lack of Transportation (Non-Medical): Not on file  Physical Activity:   . Days  of Exercise per Week: Not on file  . Minutes of Exercise per Session: Not on file  Stress:   . Feeling of Stress : Not on file  Social Connections:   . Frequency of Communication with Friends and Family: Not on file  . Frequency of Social Gatherings with Friends and Family: Not on file  . Attends Religious Services: Not on file  . Active Member of Clubs or Organizations: Not on file  . Attends Archivist Meetings: Not on file  . Marital Status: Not on file  Intimate Partner Violence:   . Fear of Current or Ex-Partner: Not on file  . Emotionally Abused: Not on file  . Physically Abused: Not on file  . Sexually Abused: Not on file    Family History  Problem Relation Age of Onset  . Diabetes Mother   . Diabetes Sister   . Diabetes Maternal Grandmother   . Heart disease Maternal Grandmother   . Hypertension Maternal Grandmother   . Kidney cancer Maternal Grandmother   . Diabetes Maternal Grandfather   . Heart disease Maternal Grandfather   . Hypertension Maternal Grandfather   . Diabetes Maternal Aunt   . Breast cancer Neg Hx   . Ovarian cancer Neg Hx   . Colon cancer Neg Hx     The following portions of the patient's history were reviewed and updated as appropriate: allergies, current medications, past family history, past medical history, past social history, past surgical history and problem list.  Review of Systems  ROS negative except as noted above. Information obtained from patient.   Objective:   BP 110/85   Pulse 75   Ht 5\' 8"  (1.727 m)   Wt 191 lb 8 oz (86.9 kg)   BMI 29.12 kg/m    CONSTITUTIONAL: Well-developed, well-nourished female in no acute distress.   ABDOMEN: Waist circumference 38.5 inches  Depression screen [facility] 2/9 02/27/2020 12/02/2019 11/04/2019 09/04/2015 08/14/2015  Decreased Interest 0 0 2 3 0  Down, Depressed, Hopeless 1 1 1 3 2   PHQ - 2 Score 1 1 3 6 2   Altered sleeping 0 0 1 3 2   Tired, decreased energy 0 0 2 2 1   Change in  appetite 0 0 1 1 2   Feeling bad or failure about yourself  1 1 2 3 2   Trouble concentrating 0 0 1 1 0  Moving slowly or fidgety/restless 0 0 1 0 0  Suicidal thoughts 0 0 0 0 0  PHQ-9 Score 2 2 11 16 9   Difficult doing  work/chores Somewhat difficult Not difficult at all Somewhat difficult Very difficult Somewhat difficult   GAD 7 : Generalized Anxiety Score 12/02/2019 11/04/2019  Nervous, Anxious, on Edge 1 3  Control/stop worrying 1 2  Worry too much - different things 1 2  Trouble relaxing 0 1  Restless 0 1  Easily annoyed or irritable 1 3  Afraid - awful might happen 1 3  Total GAD 7 Score 5 15  Anxiety Difficulty Not difficult at all Somewhat difficult      Assessment:   1. BMI 29.0-29.9,adult   2. Encounter for weight management   3. Weight loss due to medication   4. Blood pressure check   Plan:   Continue phentermine as prescribed.   Reviewed red flag symptoms and when to call.   RTC x 1 month for weight management visit or sooner if needed.    Dani Gobble, CNM [facility], [facility]

## 2020-05-28 ENCOUNTER — Ambulatory Visit
Admission: RE | Admit: 2020-05-28 | Discharge: 2020-05-28 | Disposition: A | Discharge status: Home / Self Care | Payer: 59 | Source: Ambulatory Visit | Attending: Family Medicine | Admitting: Family Medicine

## 2020-05-28 DIAGNOSIS — M5416 Radiculopathy, lumbar region: Secondary | ICD-10-CM

## 2020-05-28 DIAGNOSIS — M5126 Other intervertebral disc displacement, lumbar region: Secondary | ICD-10-CM | POA: Diagnosis not present

## 2020-05-28 MED ORDER — IOPAMIDOL (ISOVUE-M 200) INJECTION 41%
1.0000 mL | Freq: Once | INTRAMUSCULAR | Status: AC
  Administered 2020-05-28: 1 mL via EPIDURAL

## 2020-05-28 MED ORDER — METHYLPREDNISOLONE ACETATE 40 MG/ML INJ SUSP (RADIOLOG
120.0000 mg | Freq: Once | INTRAMUSCULAR | Status: AC
  Administered 2020-05-28: 120 mg via EPIDURAL

## 2020-05-28 NOTE — Discharge Instructions (Signed)

## 2020-06-01 DIAGNOSIS — M9903 Segmental and somatic dysfunction of lumbar region: Secondary | ICD-10-CM | POA: Diagnosis not present

## 2020-06-01 DIAGNOSIS — M62830 Muscle spasm of back: Secondary | ICD-10-CM | POA: Diagnosis not present

## 2020-06-01 DIAGNOSIS — M545 Low back pain: Secondary | ICD-10-CM | POA: Diagnosis not present

## 2020-06-22 ENCOUNTER — Encounter: Payer: Self-pay | Admitting: Certified Nurse Midwife

## 2020-06-22 ENCOUNTER — Ambulatory Visit (INDEPENDENT_AMBULATORY_CARE_PROVIDER_SITE_OTHER): Payer: 59 | Admitting: Certified Nurse Midwife

## 2020-06-22 ENCOUNTER — Other Ambulatory Visit: Payer: Self-pay | Admitting: Certified Nurse Midwife

## 2020-06-22 ENCOUNTER — Other Ambulatory Visit: Payer: Self-pay

## 2020-06-22 VITALS — BP 102/73 | HR 76 | Ht 68.0 in | Wt 187.2 lb

## 2020-06-22 DIAGNOSIS — Z6830 Body mass index (BMI) 30.0-30.9, adult: Secondary | ICD-10-CM

## 2020-06-22 DIAGNOSIS — Z6828 Body mass index (BMI) 28.0-28.9, adult: Secondary | ICD-10-CM | POA: Diagnosis not present

## 2020-06-22 DIAGNOSIS — Z7689 Persons encountering health services in other specified circumstances: Secondary | ICD-10-CM

## 2020-06-22 DIAGNOSIS — Z0130 Encounter for examination of blood pressure without abnormal findings: Secondary | ICD-10-CM

## 2020-06-22 MED ORDER — CYANOCOBALAMIN 1000 MCG/ML IJ SOLN
1000.0000 ug | Freq: Once | INTRAMUSCULAR | Status: AC
  Administered 2020-06-22: 1000 ug via INTRAMUSCULAR

## 2020-06-22 MED ORDER — PHENTERMINE HCL 37.5 MG PO CAPS: 37.5000 mg | ORAL_CAPSULE | ORAL | 2 refills | Status: DC

## 2020-06-22 NOTE — Progress Notes (Signed)
WC=38.5

## 2020-06-22 NOTE — Patient Instructions (Signed)
Phentermine tablets or capsules What is this medicine? PHENTERMINE (FEN ter meen) decreases your appetite. It is used with a reduced calorie diet and exercise to help you lose weight. This medicine may be used for other purposes; ask your health care provider or pharmacist if you have questions. COMMON BRAND NAME(S): Adipex-P, Atti-Plex P, Atti-Plex P Spansule, Fastin, Lomaira, Pro-Fast, Tara-8 What should I tell my health care provider before I take this medicine? They need to know if you have any of these conditions:  agitation or nervousness  diabetes  glaucoma  heart disease  high blood pressure  history of drug abuse or addiction  history of stroke  kidney disease  lung disease called Primary Pulmonary Hypertension (PPH)  taken an MAOI like Carbex, Eldepryl, Marplan, Nardil, or Parnate in last 14 days  taking stimulant medicines for attention disorders, weight loss, or to stay awake  thyroid disease  an unusual or allergic reaction to phentermine, other medicines, foods, dyes, or preservatives  pregnant or trying to get pregnant  breast-feeding How should I use this medicine? Take this medicine by mouth with a glass of water. Follow the directions on the prescription label. Take your medicine at regular intervals. Do not take it more often than directed. Do not stop taking except on your doctor's advice. Talk to your pediatrician regarding the use of this medicine in children. While this drug may be prescribed for children 17 years or older for selected conditions, precautions do apply. Overdosage: If you think you have taken too much of this medicine contact a poison control center or emergency room at once. NOTE: This medicine is only for you. Do not share this medicine with others. What if I miss a dose? If you miss a dose, take it as soon as you can. If it is almost time for your next dose, take only that dose. Do not take double or extra doses. What may interact  with this medicine? Do not take this medicine with any of the following medications:  MAOIs like Carbex, Eldepryl, Marplan, Nardil, and Parnate This medicine may also interact with the following medications:  alcohol  certain medicines for depression, anxiety, or psychotic disorders  certain medicines for high blood pressure  linezolid  medicines for colds or breathing difficulties like pseudoephedrine or phenylephrine  medicines for diabetes  sibutramine  stimulant medicines for attention disorders, weight loss, or to stay awake This list may not describe all possible interactions. Give your health care provider a list of all the medicines, herbs, non-prescription drugs, or dietary supplements you use. Also tell them if you smoke, drink alcohol, or use illegal drugs. Some items may interact with your medicine. What should I watch for while using this medicine? Visit your doctor or health care provider for regular checks on your progress. Do not stop taking except on your health care provider's advice. You may develop a severe reaction. Your health care provider will tell you how much medicine to take. Do not take this medicine close to bedtime. It may prevent you from sleeping. You may get drowsy or dizzy. Do not drive, use machinery, or do anything that needs mental alertness until you know how this medicine affects you. Do not stand or sit up quickly, especially if you are an older patient. This reduces the risk of dizzy or fainting spells. Alcohol may increase dizziness and drowsiness. Avoid alcoholic drinks. This medicine may affect blood sugar levels. Ask your healthcare provider if changes in diet or medicines are needed  if you have diabetes. Women should inform their health care provider if they wish to become pregnant or think they might be pregnant. Losing weight while pregnant is not advised and may cause harm to the unborn child. Talk to your health care provider for more  information. What side effects may I notice from receiving this medicine? Side effects that you should report to your doctor or health care professional as soon as possible:  allergic reactions like skin rash, itching or hives, swelling of the face, lips, or tongue  breathing problems  changes in emotions or moods  changes in vision  chest pain or chest tightness  fast, irregular heartbeat  feeling faint or lightheaded  increased blood pressure  irritable  restlessness  tremors  seizures  signs and symptoms of a stroke like changes in vision; confusion; trouble speaking or understanding; severe headaches; sudden numbness or weakness of the face, arm or leg; trouble walking; dizziness; loss of balance or coordination  unusually weak or tired Side effects that usually do not require medical attention (report to your doctor or health care professional if they continue or are bothersome):  changes in taste  constipation or diarrhea  dizziness  dry mouth  headache  trouble sleeping  upset stomach This list may not describe all possible side effects. Call your doctor for medical advice about side effects. You may report side effects to FDA at 1-800-FDA-1088. Where should I keep my medicine? Keep out of the reach of children. This medicine can be abused. Keep your medicine in a safe place to protect it from theft. Do not share this medicine with anyone. Selling or giving away this medicine is dangerous and against the law. This medicine may cause harm and death if it is taken by other adults, children, or pets. Return medicine that has not been used to an official disposal site. Contact the DEA at (810)768-0775 or your city/county government to find a site. If you cannot return the medicine, mix any unused medicine with a substance like cat litter or coffee grounds. Then throw the medicine away in a sealed container like a sealed bag or coffee can with a lid. Do not use the  medicine after the expiration date. Store at room temperature between 20 and 25 degrees C (68 and 77 degrees F). Keep container tightly closed. NOTE: This sheet is a summary. It may not cover all possible information. If you have questions about this medicine, talk to your doctor, pharmacist, or health care provider.  2020 Elsevier/Gold Standard (2019-06-28 12:54:20)   Calorie Counting for Weight Loss Calories are units of energy. Your body needs a certain amount of calories from food to keep you going throughout the day. When you eat more calories than your body needs, your body stores the extra calories as fat. When you eat fewer calories than your body needs, your body burns fat to get the energy it needs. Calorie counting means keeping track of how many calories you eat and drink each day. Calorie counting can be helpful if you need to lose weight. If you make sure to eat fewer calories than your body needs, you should lose weight. Ask your health care provider what a healthy weight is for you. For calorie counting to work, you will need to eat the right number of calories in a day in order to lose a healthy amount of weight per week. A dietitian can help you determine how many calories you need in a day and will  give you suggestions on how to reach your calorie goal.  A healthy amount of weight to lose per week is usually 1-2 lb (0.5-0.9 kg). This usually means that your daily calorie intake should be reduced by 500-750 calories.  Eating 1,200 - 1,500 calories per day can help most women lose weight.  Eating 1,500 - 1,800 calories per day can help most men lose weight. What is my plan? My goal is to have __________ calories per day. If I have this many calories per day, I should lose around __________ pounds per week. What do I need to know about calorie counting? In order to meet your daily calorie goal, you will need to:  Find out how many calories are in each food you would like to  eat. Try to do this before you eat.  Decide how much of the food you plan to eat.  Write down what you ate and how many calories it had. Doing this is called keeping a food log. To successfully lose weight, it is important to balance calorie counting with a healthy lifestyle that includes regular activity. Aim for 150 minutes of moderate exercise (such as walking) or 75 minutes of vigorous exercise (such as running) each week. Where do I find calorie information?  The number of calories in a food can be found on a Nutrition Facts label. If a food does not have a Nutrition Facts label, try to look up the calories online or ask your dietitian for help. Remember that calories are listed per serving. If you choose to have more than one serving of a food, you will have to multiply the calories per serving by the amount of servings you plan to eat. For example, the label on a package of bread might say that a serving size is 1 slice and that there are 90 calories in a serving. If you eat 1 slice, you will have eaten 90 calories. If you eat 2 slices, you will have eaten 180 calories. How do I keep a food log? Immediately after each meal, record the following information in your food log:  What you ate. Don't forget to include toppings, sauces, and other extras on the food.  How much you ate. This can be measured in cups, ounces, or number of items.  How many calories each food and drink had.  The total number of calories in the meal. Keep your food log near you, such as in a small notebook in your pocket, or use a mobile app or website. Some programs will calculate calories for you and show you how many calories you have left for the day to meet your goal. What are some calorie counting tips?   Use your calories on foods and drinks that will fill you up and not leave you hungry: ? Some examples of foods that fill you up are nuts and nut butters, vegetables, lean proteins, and high-fiber foods like  whole grains. High-fiber foods are foods with more than 5 g fiber per serving. ? Drinks such as sodas, specialty coffee drinks, alcohol, and juices have a lot of calories, yet do not fill you up.  Eat nutritious foods and avoid empty calories. Empty calories are calories you get from foods or beverages that do not have many vitamins or protein, such as candy, sweets, and soda. It is better to have a nutritious high-calorie food (such as an avocado) than a food with few nutrients (such as a bag of chips).  Know how  many calories are in the foods you eat most often. This will help you calculate calorie counts faster.  Pay attention to calories in drinks. Low-calorie drinks include water and unsweetened drinks.  Pay attention to nutrition labels for "low fat" or "fat free" foods. These foods sometimes have the same amount of calories or more calories than the full fat versions. They also often have added sugar, starch, or salt, to make up for flavor that was removed with the fat.  Find a way of tracking calories that works for you. Get creative. Try different apps or programs if writing down calories does not work for you. What are some portion control tips?  Know how many calories are in a serving. This will help you know how many servings of a certain food you can have.  Use a measuring cup to measure serving sizes. You could also try weighing out portions on a kitchen scale. With time, you will be able to estimate serving sizes for some foods.  Take some time to put servings of different foods on your favorite plates, bowls, and cups so you know what a serving looks like.  Try not to eat straight from a bag or box. Doing this can lead to overeating. Put the amount you would like to eat in a cup or on a plate to make sure you are eating the right portion.  Use smaller plates, glasses, and bowls to prevent overeating.  Try not to multitask (for example, watch TV or use your computer) while  eating. If it is time to eat, sit down at a table and enjoy your food. This will help you to know when you are full. It will also help you to be aware of what you are eating and how much you are eating. What are tips for following this plan? Reading food labels  Check the calorie count compared to the serving size. The serving size may be smaller than what you are used to eating.  Check the source of the calories. Make sure the food you are eating is high in vitamins and protein and low in saturated and trans fats. Shopping  Read nutrition labels while you shop. This will help you make healthy decisions before you decide to purchase your food.  Make a grocery list and stick to it. Cooking  Try to cook your favorite foods in a healthier way. For example, try baking instead of frying.  Use low-fat dairy products. Meal planning  Use more fruits and vegetables. Half of your plate should be fruits and vegetables.  Include lean proteins like poultry and fish. How do I count calories when eating out?  Ask for smaller portion sizes.  Consider sharing an entree and sides instead of getting your own entree.  If you get your own entree, eat only half. Ask for a box at the beginning of your meal and put the rest of your entree in it so you are not tempted to eat it.  If calories are listed on the menu, choose the lower calorie options.  Choose dishes that include vegetables, fruits, whole grains, low-fat dairy products, and lean protein.  Choose items that are boiled, broiled, grilled, or steamed. Stay away from items that are buttered, battered, fried, or served with cream sauce. Items labeled "crispy" are usually fried, unless stated otherwise.  Choose water, low-fat milk, unsweetened iced tea, or other drinks without added sugar. If you want an alcoholic beverage, choose a lower calorie option such as  a glass of wine or light beer.  Ask for dressings, sauces, and syrups on the side.  These are usually high in calories, so you should limit the amount you eat.  If you want a salad, choose a garden salad and ask for grilled meats. Avoid extra toppings like bacon, cheese, or fried items. Ask for the dressing on the side, or ask for olive oil and vinegar or lemon to use as dressing.  Estimate how many servings of a food you are given. For example, a serving of cooked rice is  cup or about the size of half a baseball. Knowing serving sizes will help you be aware of how much food you are eating at restaurants. The list below tells you how big or small some common portion sizes are based on everyday objects: ? 1 oz--4 stacked dice. ? 3 oz--1 deck of cards. ? 1 tsp--1 die. ? 1 Tbsp-- a ping-pong ball. ? 2 Tbsp--1 ping-pong ball. ?  cup-- baseball. ? 1 cup--1 baseball. Summary  Calorie counting means keeping track of how many calories you eat and drink each day. If you eat fewer calories than your body needs, you should lose weight.  A healthy amount of weight to lose per week is usually 1-2 lb (0.5-0.9 kg). This usually means reducing your daily calorie intake by 500-750 calories.  The number of calories in a food can be found on a Nutrition Facts label. If a food does not have a Nutrition Facts label, try to look up the calories online or ask your dietitian for help.  Use your calories on foods and drinks that will fill you up, and not on foods and drinks that will leave you hungry.  Use smaller plates, glasses, and bowls to prevent overeating. This information is not intended to replace advice given to you by your health care provider. Make sure you discuss any questions you have with your health care provider. Document Revised: 05/11/2018 Document Reviewed: 07/22/2016 Elsevier Patient Education  [place].

## 2020-06-22 NOTE — Progress Notes (Signed)
GYN ENCOUNTER NOTE  Subjective:       Jennifer Morse is a 29 y.o. G53P2001 female here for weight management visit.   Back feeling "better" since last visit, follow up scheduled with Dr. Tamala Julian on Thursday.   Moving a "more" since epidural placement, considering repeat procedure.   No adverse effects of medication. Wishes to continue.    Gynecologic History  No LMP recorded. (Menstrual status: IUD).  Contraception: IUD  Last Pap: Up to date  Obstetric History OB History  Gravida Para Term Preterm AB Living  2 2 2  0 0 1  SAB TAB Ectopic Multiple Live Births  0 0 0 0 1    # Outcome Date GA Lbr Len/2nd Weight Sex Delivery Anes PTL Lv  2 Term 07/08/17 [redacted]w[redacted]d 15:15 / 00:28 8 lb 2.9 oz (3.71 kg) M Vag-Spont EPI N LIV     Birth Comments: Dimpled cheek (right)  1 Term 07/01/15 [redacted]w[redacted]d 218:30 / 02:26 8 lb 2.9 oz (3.71 kg) F Vag-Spont EPI  LIV    Obstetric Comments  1st Menstrual Cycle:  15   1st Pregnancy:  24    Past Medical History:  Diagnosis Date  . Anxiety   . Degenerative lumbar disc   . Frequent headaches   . Gestational thrombocytopenia ([place])   . Lumbar herniated disc   . Migraine with aura, intractable, with status migrainosus 1995  . Oligomenorrhea   . Postpartum depression    with first pregnancy    Past Surgical History:  Procedure Laterality Date  . WISDOM TOOTH EXTRACTION  2012    Current Outpatient Medications on File Prior to Visit  Medication Sig Dispense Refill  . aspirin-acetaminophen-caffeine (EXCEDRIN MIGRAINE) 250-250-65 MG tablet Take by mouth every 6 (six) hours as needed for headache.    Marland Kitchen buPROPion (WELLBUTRIN XL) 150 MG 24 hr tablet TAKE 1 TABLET BY MOUTH DAILY. MAY INCREASE TO 2 TABLETS DAILY AFTER 1 WEEK 60 tablet 5  . cyanocobalamin (,VITAMIN B-12,) 1000 MCG/ML injection Inject 1 mL (1,000 mcg total) into the muscle every 30 (thirty) days. 1 mL 2  . gabapentin (NEURONTIN) 100 MG capsule Take 2 capsules (200 mg total) by mouth at bedtime.  180 capsule 3  . levonorgestrel (MIRENA) 20 MCG/24HR IUD 1 each by Intrauterine route once.    Marland Kitchen SYNTHROID 25 MCG tablet Take one tab daily 30 tablet 5  . tiZANidine (ZANAFLEX) 4 MG tablet Take 1 tablet (4 mg total) by mouth at bedtime. 30 tablet 1  . [DISCONTINUED] azelastine (ASTELIN) 0.1 % nasal spray Place 1 spray into both nostrils 2 (two) times daily. Use in each nostril as directed (Patient taking differently: Place 1 spray into both nostrils as needed. Use in each nostril as directed) 30 mL 12   No current facility-administered medications on file prior to visit.    No Known Allergies  Social History   Socioeconomic History  . Marital status: Married    Spouse name: Edison Nasuti  . Number of children: 2  . Years of education: Not on file  . Highest education level: Not on file  Occupational History  . Occupation: Therapist, sports  Tobacco Use  . Smoking status: Former Smoker    Packs/day: 0.25    Types: Cigarettes    Quit date: 02/26/2015    Years since quitting: 5.3  . Smokeless tobacco: Never Used  Vaping Use  . Vaping Use: Never used  Substance and Sexual Activity  . Alcohol use: Not Currently    Alcohol/week:  1.0 standard drink    Types: 1 Glasses of wine per week  . Drug use: No  . Sexual activity: Yes    Partners: Male    Birth control/protection: I.U.D.  Other Topics Concern  . Not on file  Social History Narrative  . Not on file   Social Determinants of Health   Financial Resource Strain:   . Difficulty of Paying Living Expenses: Not on file  Food Insecurity:   . Worried About Charity fundraiser in the Last Year: Not on file  . Ran Out of Food in the Last Year: Not on file  Transportation Needs:   . Lack of Transportation (Medical): Not on file  . Lack of Transportation (Non-Medical): Not on file  Physical Activity:   . Days of Exercise per Week: Not on file  . Minutes of Exercise per Session: Not on file  Stress:   . Feeling of Stress : Not on file  Social  Connections:   . Frequency of Communication with Friends and Family: Not on file  . Frequency of Social Gatherings with Friends and Family: Not on file  . Attends Religious Services: Not on file  . Active Member of Clubs or Organizations: Not on file  . Attends Archivist Meetings: Not on file  . Marital Status: Not on file  Intimate Partner Violence:   . Fear of Current or Ex-Partner: Not on file  . Emotionally Abused: Not on file  . Physically Abused: Not on file  . Sexually Abused: Not on file    Family History  Problem Relation Age of Onset  . Diabetes Mother   . Diabetes Sister   . Diabetes Maternal Grandmother   . Heart disease Maternal Grandmother   . Hypertension Maternal Grandmother   . Kidney cancer Maternal Grandmother   . Diabetes Maternal Grandfather   . Heart disease Maternal Grandfather   . Hypertension Maternal Grandfather   . Diabetes Maternal Aunt   . Breast cancer Neg Hx   . Ovarian cancer Neg Hx   . Colon cancer Neg Hx     The following portions of the patient's history were reviewed and updated as appropriate: allergies, current medications, past family history, past medical history, past social history, past surgical history and problem list.  Review of Systems  ROS negative except as noted above. Information obtained from patient.   Objective:   BP 102/73   Pulse 76   Ht 5\' 8"  (1.727 m)   Wt 187 lb 3 oz (84.9 kg)   BMI 28.46 kg/m    CONSTITUTIONAL: Well-developed, well-nourished female in no acute distress.   ABDOMEN: Waist Circumference 38.5 inches   Assessment:   1. Encounter for weight management   2. Blood pressure check   3. BMI 28.0-28.9,adult   Plan:   B-12 today, see chart.   Rx Phentermine, see orders.   Praise given for four (4) pound weight loss.   Reviewed red flag symptoms and when to call.   RTC x 4 weeks for weight management visit or sooner if needed.    Dani Gobble, CNM [facility], [facility] 06/22/20 11:23 AM

## 2020-06-24 NOTE — Progress Notes (Signed)
[place] Phone: [phone] Subjective:   Fontaine No, am serving as a scribe for Dr. Hulan Saas. This visit occurred during the SARS-CoV-2 public health emergency.  Safety protocols were in place, including screening questions prior to the visit, additional usage of staff PPE, and extensive cleaning of exam room while observing appropriate contact time as indicated for disinfecting solutions.   I'm seeing this patient by the request  of:  Diona Fanti, CNM  CC: Low back pain follow-up  [id number]   05/13/2020 Patient does have more of a lumbar radiculopathy.  Discussed with patient in great length.  Patient's pain seems to be worsening and does have radicular symptoms with 20 degrees of forward flexion.  Patient has not responded well to the prednisone, oral anti-inflammatories, or muscle relaxer, discontinued Flexeril and started Zanaflex, starting gabapentin.  Because of the radicular symptoms and worsening pain over the course the last several weeks and significantly worse over the last week I would like to get an MRI of the back.  Without anything such as a herniated disc that could be contributing or an occult fracture.  Patient is usually very active.  Seems extremely uncomfortable today, has Percocet from another provider and can use sparingly if necessary.   Update 06/24/2020 Jennifer Morse is a 29 y.o. female coming in with complaint of chronic low back pain. Epidural 05/28/2020. The epidural did help. Still has soreness in lower back. Does have a catch or sharp pain intermittently. Use to be constant. Using gabapentin and mm relaxer if she cannot sleep.  Otherwise patient would state that she feels approximately 75% better.      Past Medical History:  Diagnosis Date  . Anxiety   . Degenerative lumbar disc   . Frequent headaches   . Gestational thrombocytopenia ([place])   . Lumbar  herniated disc   . Migraine with aura, intractable, with status migrainosus 1995  . Oligomenorrhea   . Postpartum depression    with first pregnancy   Past Surgical History:  Procedure Laterality Date  . WISDOM TOOTH EXTRACTION  2012   Social History   Socioeconomic History  . Marital status: Married    Spouse name: Edison Nasuti  . Number of children: 2  . Years of education: Not on file  . Highest education level: Not on file  Occupational History  . Occupation: Therapist, sports  Tobacco Use  . Smoking status: Former Smoker    Packs/day: 0.25    Types: Cigarettes    Quit date: 02/26/2015    Years since quitting: 5.3  . Smokeless tobacco: Never Used  Vaping Use  . Vaping Use: Never used  Substance and Sexual Activity  . Alcohol use: Not Currently    Alcohol/week: 1.0 standard drink    Types: 1 Glasses of wine per week  . Drug use: No  . Sexual activity: Yes    Partners: Male    Birth control/protection: I.U.D.  Other Topics Concern  . Not on file  Social History Narrative  . Not on file   Social Determinants of Health   Financial Resource Strain:   . Difficulty of Paying Living Expenses: Not on file  Food Insecurity:   . Worried About Charity fundraiser in the Last Year: Not on file  . Ran Out of Food in the Last Year: Not on file  Transportation Needs:   . Lack of Transportation (Medical): Not on file  .  Lack of Transportation (Non-Medical): Not on file  Physical Activity:   . Days of Exercise per Week: Not on file  . Minutes of Exercise per Session: Not on file  Stress:   . Feeling of Stress : Not on file  Social Connections:   . Frequency of Communication with Friends and Family: Not on file  . Frequency of Social Gatherings with Friends and Family: Not on file  . Attends Religious Services: Not on file  . Active Member of Clubs or Organizations: Not on file  . Attends Archivist Meetings: Not on file  . Marital Status: Not on file   No Known Allergies Family  History  Problem Relation Age of Onset  . Diabetes Mother   . Diabetes Sister   . Diabetes Maternal Grandmother   . Heart disease Maternal Grandmother   . Hypertension Maternal Grandmother   . Kidney cancer Maternal Grandmother   . Diabetes Maternal Grandfather   . Heart disease Maternal Grandfather   . Hypertension Maternal Grandfather   . Diabetes Maternal Aunt   . Breast cancer Neg Hx   . Ovarian cancer Neg Hx   . Colon cancer Neg Hx     Current Outpatient Medications (Endocrine & Metabolic):  .  levonorgestrel (MIRENA) 20 MCG/24HR IUD, 1 each by Intrauterine route once. Marland Kitchen  SYNTHROID 25 MCG tablet, Take one tab daily    Current Outpatient Medications (Analgesics):  .  aspirin-acetaminophen-caffeine (EXCEDRIN MIGRAINE) 250-250-65 MG tablet, Take by mouth every 6 (six) hours as needed for headache.  Current Outpatient Medications (Hematological):  .  cyanocobalamin (,VITAMIN B-12,) 1000 MCG/ML injection, Inject 1 mL (1,000 mcg total) into the muscle every 30 (thirty) days.  Current Outpatient Medications (Other):  Marland Kitchen  buPROPion (WELLBUTRIN XL) 150 MG 24 hr tablet, TAKE 1 TABLET BY MOUTH DAILY. MAY INCREASE TO 2 TABLETS DAILY AFTER 1 WEEK .  gabapentin (NEURONTIN) 100 MG capsule, Take 2 capsules (200 mg total) by mouth at bedtime. .  phentermine 37.5 MG capsule, Take 1 capsule (37.5 mg total) by mouth every morning. Marland Kitchen  tiZANidine (ZANAFLEX) 4 MG tablet, Take 1 tablet (4 mg total) by mouth at bedtime.   Reviewed prior external information including notes and imaging from  primary care provider As well as notes that were available from care everywhere and other healthcare systems.  Past medical history, social, surgical and family history all reviewed in electronic medical record.  No pertanent information unless stated regarding to the chief complaint.   Review of Systems:  No headache, visual changes, nausea, vomiting, diarrhea, constipation, dizziness, abdominal pain, skin  rash, fevers, chills, night sweats, weight loss, swollen lymph nodes, body aches, joint swelling, chest pain, shortness of breath, mood changes. POSITIVE muscle aches  Objective  Blood pressure 112/74, pulse 76, height 5\' 8"  (1.727 m), weight 189 lb (85.7 kg), SpO2 99 %.   General: No apparent distress alert and oriented x3 mood and affect normal, dressed appropriately.  HEENT: Pupils equal, extraocular movements intact  Respiratory: Patient's speak in full sentences and does not appear short of breath  Cardiovascular: No lower extremity edema, non tender, no erythema  Neuro: Cranial nerves II through XII are intact, neurovascularly intact in all extremities with 2+ DTRs and 2+ pulses.  Gait normal with good balance and coordination.  MSK:  Non tender with full range of motion and good stability and symmetric strength and tone of shoulders, elbows, wrist, hip, knee and ankles bilaterally.  Low back exam is significantly better.  Significant increase in range of motion.  Still some tightness with FABER test as well as hamstrings are very tight right greater than left.   Impression and Recommendations:     The above documentation has been reviewed and is accurate and complete Lyndal Pulley, DO

## 2020-06-25 ENCOUNTER — Ambulatory Visit (INDEPENDENT_AMBULATORY_CARE_PROVIDER_SITE_OTHER): Payer: 59 | Admitting: Family Medicine

## 2020-06-25 ENCOUNTER — Other Ambulatory Visit: Payer: Self-pay

## 2020-06-25 ENCOUNTER — Encounter: Payer: Self-pay | Admitting: Family Medicine

## 2020-06-25 DIAGNOSIS — M5416 Radiculopathy, lumbar region: Secondary | ICD-10-CM | POA: Diagnosis not present

## 2020-06-25 NOTE — Assessment & Plan Note (Signed)
Patient was found to have a protruding disc that was causing some nerve impingement.  Was given an epidural on September 23.  Feeling 70 to 75% better.  Patient will start to increase activity as tolerated.  Patient wants to do the home exercises on her own at this time but will consider the possibility of physical therapy.  Patient will see me again in 6 to 8 weeks

## 2020-06-25 NOTE — Patient Instructions (Signed)
Good to see you  Keep up the good work  Continue working out but at [employer] of the weight Increase weight by 10% a week  See me again in 6 weeks can consider manipulation

## 2020-07-24 ENCOUNTER — Ambulatory Visit (INDEPENDENT_AMBULATORY_CARE_PROVIDER_SITE_OTHER): Payer: 59 | Admitting: Certified Nurse Midwife

## 2020-07-24 ENCOUNTER — Other Ambulatory Visit: Payer: Self-pay

## 2020-07-24 ENCOUNTER — Encounter: Payer: Self-pay | Admitting: Certified Nurse Midwife

## 2020-07-24 VITALS — BP 117/81 | HR 77 | Ht 68.0 in | Wt 185.5 lb

## 2020-07-24 DIAGNOSIS — Z6828 Body mass index (BMI) 28.0-28.9, adult: Secondary | ICD-10-CM

## 2020-07-24 DIAGNOSIS — Z7689 Persons encountering health services in other specified circumstances: Secondary | ICD-10-CM | POA: Diagnosis not present

## 2020-07-24 DIAGNOSIS — Z0130 Encounter for examination of blood pressure without abnormal findings: Secondary | ICD-10-CM

## 2020-07-24 MED ORDER — CYANOCOBALAMIN 1000 MCG/ML IJ SOLN
1000.0000 ug | Freq: Once | INTRAMUSCULAR | Status: AC
Start: 2020-07-24 — End: 2020-07-24
  Administered 2020-07-24: 1000 ug via INTRAMUSCULAR

## 2020-07-24 NOTE — Patient Instructions (Signed)

## 2020-07-24 NOTE — Progress Notes (Signed)
GYN ENCOUNTER NOTE  Subjective:       Jennifer Morse is a 29 y.o. G2P2001 female weight management visit.   Last seen in office on 06/22/2020; for further details, please see previous note.   Taking medication daily. Following diet and exercising when possible.   Denies negative side effects.   Gynecologic History  No LMP recorded. (Menstrual status: IUD).   Contraception: IUD, Mirena  Last Pap: up to date. Results were: normal  Obstetric History OB History  Gravida Para Term Preterm AB Living  2 2 2  0 0 1  SAB TAB Ectopic Multiple Live Births  0 0 0 0 1    # Outcome Date GA Lbr Len/2nd Weight Sex Delivery Anes PTL Lv  2 Term 07/08/17 [redacted]w[redacted]d 15:15 / 00:28 8 lb 2.9 oz (3.71 kg) M Vag-Spont EPI N LIV     Birth Comments: Dimpled cheek (right)  1 Term 07/01/15 [redacted]w[redacted]d 218:30 / 02:26 8 lb 2.9 oz (3.71 kg) F Vag-Spont EPI  LIV    Obstetric Comments  1st Menstrual Cycle:  15   1st Pregnancy:  24    Past Medical History:  Diagnosis Date  . Anxiety   . Degenerative lumbar disc   . Frequent headaches   . Gestational thrombocytopenia ([place])   . Lumbar herniated disc   . Migraine with aura, intractable, with status migrainosus 1995  . Oligomenorrhea   . Postpartum depression    with first pregnancy    Past Surgical History:  Procedure Laterality Date  . WISDOM TOOTH EXTRACTION  2012    Current Outpatient Medications on File Prior to Visit  Medication Sig Dispense Refill  . aspirin-acetaminophen-caffeine (EXCEDRIN MIGRAINE) 250-250-65 MG tablet Take by mouth every 6 (six) hours as needed for headache.    Marland Kitchen buPROPion (WELLBUTRIN XL) 150 MG 24 hr tablet TAKE 1 TABLET BY MOUTH DAILY. MAY INCREASE TO 2 TABLETS DAILY AFTER 1 WEEK 60 tablet 5  . cyanocobalamin (,VITAMIN B-12,) 1000 MCG/ML injection Inject 1 mL (1,000 mcg total) into the muscle every 30 (thirty) days. 1 mL 2  . gabapentin (NEURONTIN) 100 MG capsule Take 2 capsules (200 mg total) by mouth at bedtime. 180  capsule 3  . levonorgestrel (MIRENA) 20 MCG/24HR IUD 1 each by Intrauterine route once.    . phentermine 37.5 MG capsule Take 1 capsule (37.5 mg total) by mouth every morning. 30 capsule 2  . SYNTHROID 25 MCG tablet Take one tab daily 30 tablet 5  . tiZANidine (ZANAFLEX) 4 MG tablet Take 1 tablet (4 mg total) by mouth at bedtime. 30 tablet 1  . [DISCONTINUED] azelastine (ASTELIN) 0.1 % nasal spray Place 1 spray into both nostrils 2 (two) times daily. Use in each nostril as directed (Patient taking differently: Place 1 spray into both nostrils as needed. Use in each nostril as directed) 30 mL 12   No current facility-administered medications on file prior to visit.    No Known Allergies  Social History   Socioeconomic History  . Marital status: Married    Spouse name: Edison Nasuti  . Number of children: 2  . Years of education: Not on file  . Highest education level: Not on file  Occupational History  . Occupation: Therapist, sports  Tobacco Use  . Smoking status: Former Smoker    Packs/day: 0.25    Types: Cigarettes    Quit date: 02/26/2015    Years since quitting: 5.4  . Smokeless tobacco: Never Used  Vaping Use  . Vaping Use: Never  used  Substance and Sexual Activity  . Alcohol use: Not Currently    Alcohol/week: 1.0 standard drink    Types: 1 Glasses of wine per week  . Drug use: No  . Sexual activity: Yes    Partners: Male    Birth control/protection: I.U.D.  Other Topics Concern  . Not on file  Social History Narrative  . Not on file   Social Determinants of Health   Financial Resource Strain:   . Difficulty of Paying Living Expenses: Not on file  Food Insecurity:   . Worried About Charity fundraiser in the Last Year: Not on file  . Ran Out of Food in the Last Year: Not on file  Transportation Needs:   . Lack of Transportation (Medical): Not on file  . Lack of Transportation (Non-Medical): Not on file  Physical Activity:   . Days of Exercise per Week: Not on file  . Minutes of  Exercise per Session: Not on file  Stress:   . Feeling of Stress : Not on file  Social Connections:   . Frequency of Communication with Friends and Family: Not on file  . Frequency of Social Gatherings with Friends and Family: Not on file  . Attends Religious Services: Not on file  . Active Member of Clubs or Organizations: Not on file  . Attends Archivist Meetings: Not on file  . Marital Status: Not on file  Intimate Partner Violence:   . Fear of Current or Ex-Partner: Not on file  . Emotionally Abused: Not on file  . Physically Abused: Not on file  . Sexually Abused: Not on file    Family History  Problem Relation Age of Onset  . Diabetes Mother   . Diabetes Sister   . Diabetes Maternal Grandmother   . Heart disease Maternal Grandmother   . Hypertension Maternal Grandmother   . Kidney cancer Maternal Grandmother   . Diabetes Maternal Grandfather   . Heart disease Maternal Grandfather   . Hypertension Maternal Grandfather   . Diabetes Maternal Aunt   . Breast cancer Neg Hx   . Ovarian cancer Neg Hx   . Colon cancer Neg Hx     The following portions of the patient's history were reviewed and updated as appropriate: allergies, current medications, past family history, past medical history, past social history, past surgical history and problem list.  Review of Systems  ROS negative except as noted above. Information obtained from patient.   Objective:   BP 117/81   Pulse 77   Ht 5\' 8"  (1.727 m)   Wt 185 lb 8 oz (84.1 kg)   BMI 28.21 kg/m    CONSTITUTIONAL: Well-developed, well-nourished female in no acute distress.   WAIST CIRCUMFERENCE: 37.5 inches   Assessment:   1. BMI 28.0-28.9,adult   2. Encounter for weight management   3. Blood pressure check   Plan:   Praise given for two (2) pound weight loss.  B-12 injection today, see chart.   Continue medication as prescribed.   Reviewed red flag symptoms and when to call.   RTC x 4  weeks for weight management visit or sooner if needed.    Dani Gobble, CNM [facility], [facility] 07/24/20 10:18 AM

## 2020-07-24 NOTE — Progress Notes (Signed)
Wc: 37.5

## 2020-08-03 ENCOUNTER — Other Ambulatory Visit: Payer: Self-pay | Admitting: Emergency Medicine

## 2020-08-03 DIAGNOSIS — N201 Calculus of ureter: Secondary | ICD-10-CM | POA: Diagnosis not present

## 2020-08-03 DIAGNOSIS — R112 Nausea with vomiting, unspecified: Secondary | ICD-10-CM | POA: Diagnosis not present

## 2020-08-03 DIAGNOSIS — N23 Unspecified renal colic: Secondary | ICD-10-CM | POA: Diagnosis not present

## 2020-08-03 DIAGNOSIS — N1330 Unspecified hydronephrosis: Secondary | ICD-10-CM | POA: Diagnosis not present

## 2020-08-03 DIAGNOSIS — R1084 Generalized abdominal pain: Secondary | ICD-10-CM | POA: Diagnosis not present

## 2020-08-07 ENCOUNTER — Other Ambulatory Visit (HOSPITAL_COMMUNITY): Payer: Self-pay

## 2020-08-08 ENCOUNTER — Encounter: Payer: Self-pay | Admitting: Oncology

## 2020-08-08 ENCOUNTER — Telehealth: Payer: Self-pay | Admitting: Oncology

## 2020-08-08 ENCOUNTER — Other Ambulatory Visit: Payer: Self-pay | Admitting: Oncology

## 2020-08-08 DIAGNOSIS — U071 COVID-19: Secondary | ICD-10-CM

## 2020-08-08 NOTE — Progress Notes (Signed)
I connected by phone with  Jennifer Morse  to discuss the potential use of an new treatment for mild to moderate COVID-19 viral infection in non-hospitalized patients.   This patient is a age/sex that meets the FDA criteria for Emergency Use Authorization of casirivimab\imdevimab.  Has a (+) direct SARS-CoV-2 viral test result 1. Has mild or moderate COVID-19  2. Is ? 31 years of age and weighs ? 40 kg 3. Is NOT hospitalized due to COVID-19 4. Is NOT requiring oxygen therapy or requiring an increase in baseline oxygen flow rate due to COVID-19 5. Is within 10 days of symptom onset 6. Has at least one of the high risk factor(s) for progression to severe COVID-19 and/or hospitalization as defined in EUA. Specific high risk criteria : Past Medical History:  Diagnosis Date  . Anxiety   . Degenerative lumbar disc   . Frequent headaches   . Gestational thrombocytopenia ([place])   . Lumbar herniated disc   . Migraine with aura, intractable, with status migrainosus 1995  . Oligomenorrhea   . Postpartum depression    with first pregnancy  ?  ?    Symptom onset 09/01/20   I have spoken and communicated the following to the patient or parent/caregiver:   1. FDA has authorized the emergency use of casirivimab\imdevimab for the treatment of mild to moderate COVID-19 in adults and pediatric patients with positive results of direct SARS-CoV-2 viral testing who are 39 years of age and older weighing at least 40 kg, and who are at high risk for progressing to severe COVID-19 and/or hospitalization.   2. The significant known and potential risks and benefits of casirivimab\imdevimab, and the extent to which such potential risks and benefits are unknown.   3. Information on available alternative treatments and the risks and benefits of those alternatives, including clinical trials.   4. Patients treated with casirivimab\imdevimab should continue to self-isolate and use infection control measures (e.g.,  wear mask, isolate, social distance, avoid sharing personal items, clean and disinfect "high touch" surfaces, and frequent handwashing) according to CDC guidelines.    5. The patient or parent/caregiver has the option to accept or refuse casirivimab\imdevimab .   After reviewing this information with the patient, The patient agreed to proceed with receiving casirivimab\imdevimab infusion and will be provided a copy of the Fact sheet prior to receiving the infusion.Rulon Abide, AGNP-C [phone] ([place])

## 2020-08-08 NOTE — Telephone Encounter (Signed)
Re: Mab Infusion  Called to Discuss with patient about Covid symptoms and the use of regeneron, a monoclonal antibody infusion for those with mild to moderate Covid symptoms and at a high risk of hospitalization.     Pt is qualified for this infusion at the [facility] infusion center due to co-morbid conditions and/or a member of an at-risk group.    Past Medical History:  Diagnosis Date  . Anxiety   . Degenerative lumbar disc   . Frequent headaches   . Gestational thrombocytopenia ([place])   . Lumbar herniated disc   . Migraine with aura, intractable, with status migrainosus 1995  . Oligomenorrhea   . Postpartum depression    with first pregnancy    Specific risk condition-Obesity    Unable to reach pt. Left VM and MCM.  Rulon Abide, AGNP-C [phone] ([place])

## 2020-08-10 ENCOUNTER — Ambulatory Visit (HOSPITAL_COMMUNITY)
Admission: RE | Admit: 2020-08-10 | Discharge: 2020-08-10 | Disposition: A | Discharge status: Home / Self Care | Payer: 59 | Source: Ambulatory Visit | Attending: Pulmonary Disease | Admitting: Pulmonary Disease

## 2020-08-10 DIAGNOSIS — U071 COVID-19: Secondary | ICD-10-CM | POA: Diagnosis not present

## 2020-08-10 MED ORDER — SODIUM CHLORIDE 0.9 % IV SOLN: Freq: Once | INTRAVENOUS | Status: AC

## 2020-08-10 MED ORDER — SODIUM CHLORIDE 0.9 % IV SOLN: INTRAVENOUS | Status: DC | PRN

## 2020-08-10 MED ORDER — METHYLPREDNISOLONE SODIUM SUCC 125 MG IJ SOLR: 125.0000 mg | Freq: Once | INTRAMUSCULAR | Status: DC | PRN

## 2020-08-10 MED ORDER — DIPHENHYDRAMINE HCL 50 MG/ML IJ SOLN: 50.0000 mg | Freq: Once | INTRAMUSCULAR | Status: DC | PRN

## 2020-08-10 MED ORDER — FAMOTIDINE IN NACL 20-0.9 MG/50ML-% IV SOLN: 20.0000 mg | Freq: Once | INTRAVENOUS | Status: DC | PRN

## 2020-08-10 MED ORDER — ALBUTEROL SULFATE HFA 108 (90 BASE) MCG/ACT IN AERS: 2.0000 | INHALATION_SPRAY | Freq: Once | RESPIRATORY_TRACT | Status: DC | PRN

## 2020-08-10 MED ORDER — EPINEPHRINE 0.3 MG/0.3ML IJ SOAJ: 0.3000 mg | Freq: Once | INTRAMUSCULAR | Status: DC | PRN

## 2020-08-10 NOTE — Progress Notes (Signed)
Patient reviewed Fact Sheet for Patients, Parents, and Caregivers for Emergency Use Authorization (EUA) of Sotrovimab for the Treatment of Coronavirus. Patient also reviewed and is agreeable to the estimated cost of treatment. Patient is agreeable to proceed.   

## 2020-08-10 NOTE — Progress Notes (Signed)
  Diagnosis: COVID-19  Physician:  Asencion Noble  Procedure: [facility] Med: casirivimab\imdevimab infusion - Provided patient with casirivimab\imdevimab fact sheet for patients, parents and caregivers prior to infusion.  Complications: No immediate complications noted.  Discharge: Discharged home   Dorene Sorrow 08/10/2020

## 2020-08-10 NOTE — Discharge Instructions (Signed)
10 Things You Can Do to Manage Your COVID-19 Symptoms at Home If you have possible or confirmed COVID-19: 1. Stay home from work and school. And stay away from other public places. If you must go out, avoid using any kind of public transportation, ridesharing, or taxis. 2. Monitor your symptoms carefully. If your symptoms get worse, call your healthcare provider immediately. 3. Get rest and stay hydrated. 4. If you have a medical appointment, call the healthcare provider ahead of time and tell them that you have or may have COVID-19. 5. For medical emergencies, call 911 and notify the dispatch personnel that you have or may have COVID-19. 6. Cover your cough and sneezes with a tissue or use the inside of your elbow. 7. Wash your hands often with soap and water for at least 20 seconds or clean your hands with an alcohol-based hand sanitizer that contains at least 60% alcohol. 8. As much as possible, stay in a specific room and away from other people in your home. Also, you should use a separate bathroom, if available. If you need to be around other people in or outside of the home, wear a mask. 9. Avoid sharing personal items with other people in your household, like dishes, towels, and bedding. 10. Clean all surfaces that are touched often, like counters, tabletops, and doorknobs. Use household cleaning sprays or wipes according to the label instructions. cdc.gov/coronavirus 03/06/2019 This information is not intended to replace advice given to you by your health care provider. Make sure you discuss any questions you have with your health care provider. Document Revised: 08/08/2019 Document Reviewed: 08/08/2019 Elsevier Patient Education  2020 Elsevier Inc. What types of side effects do monoclonal antibody drugs cause?  Common side effects  In general, the more common side effects caused by monoclonal antibody drugs include: . Allergic reactions, such as hives or itching . Flu-like signs and  symptoms, including chills, fatigue, fever, and muscle aches and pains . Nausea, vomiting . Diarrhea . Skin rashes . Low blood pressure   The CDC is recommending patients who receive monoclonal antibody treatments wait at least 90 days before being vaccinated.  Currently, there are no data on the safety and efficacy of mRNA COVID-19 vaccines in persons who received monoclonal antibodies or convalescent plasma as part of COVID-19 treatment. Based on the estimated half-life of such therapies as well as evidence suggesting that reinfection is uncommon in the 90 days after initial infection, vaccination should be deferred for at least 90 days, as a precautionary measure until additional information becomes available, to avoid interference of the antibody treatment with vaccine-induced immune responses. If you have any questions or concerns after the infusion please call the Advanced Practice Provider on call at [phone]. This number is ONLY intended for your use regarding questions or concerns about the infusion post-treatment side-effects.  Please do not provide this number to others for use. For return to work notes please contact your primary care provider.   If someone you know is interested in receiving treatment please have them call the COVID hotline at [phone].   

## 2020-08-18 ENCOUNTER — Other Ambulatory Visit: Payer: Self-pay | Admitting: Certified Nurse Midwife

## 2020-08-18 NOTE — Progress Notes (Signed)
[facility] [place] Phone: [phone] Subjective:   Fontaine No, am serving as a scribe for Dr. Hulan Saas. This visit occurred during the SARS-CoV-2 public health emergency.  Safety protocols were in place, including screening questions prior to the visit, additional usage of staff PPE, and extensive cleaning of exam room while observing appropriate contact time as indicated for disinfecting solutions.   I'm seeing this patient by the request  of:  Diona Fanti, CNM  CC: Back pain follow-up,  [id number]   06/25/2020 Patient was found to have a protruding disc that was causing some nerve impingement.  Was given an epidural on September 23.  Feeling 70 to 75% better.  Patient will start to increase activity as tolerated.  Patient wants to do the home exercises on her own at this time but will consider the possibility of physical therapy.  Patient will see me again in 6 to 8 weeks  Update 08/18/2020 Tarrin Menn Mckey is a 29 y.o. female coming in with complaint of lumbar spine pain. Patient states that her lower back pain due to coughing. Using gabapentin at night and zanaflex prn. Same day she tested positive for covid patient had ER visit for kidney stones. Wants to know if she should continue Vit D as she feels this may have contributed.  Patient states that she has had a cough Back pain.  Has noticed more leg pain.  Recently went back to work and with working started having increasing discomfort and pain patient states that still having significant fatigue.  Feels like her thighs can be on fire.     Past Medical History:  Diagnosis Date  . Anxiety   . Degenerative lumbar disc   . Frequent headaches   . Gestational thrombocytopenia ([place])   . Lumbar herniated disc   . Migraine with aura, intractable, with status migrainosus 1995  . Oligomenorrhea   . Postpartum depression    with first pregnancy    Past Surgical History:  Procedure Laterality Date  . WISDOM TOOTH EXTRACTION  2012   Social History   Socioeconomic History  . Marital status: Married    Spouse name: Edison Nasuti  . Number of children: 2  . Years of education: Not on file  . Highest education level: Not on file  Occupational History  . Occupation: Therapist, sports  Tobacco Use  . Smoking status: Former Smoker    Packs/day: 0.25    Types: Cigarettes    Quit date: 02/26/2015    Years since quitting: 5.4  . Smokeless tobacco: Never Used  Vaping Use  . Vaping Use: Never used  Substance and Sexual Activity  . Alcohol use: Not Currently    Alcohol/week: 1.0 standard drink    Types: 1 Glasses of wine per week  . Drug use: No  . Sexual activity: Yes    Partners: Male    Birth control/protection: I.U.D.  Other Topics Concern  . Not on file  Social History Narrative  . Not on file   Social Determinants of Health   Financial Resource Strain: Not on file  Food Insecurity: Not on file  Transportation Needs: Not on file  Physical Activity: Not on file  Stress: Not on file  Social Connections: Not on file   No Known Allergies Family History  Problem Relation Age of Onset  . Diabetes Mother   . Diabetes Sister   . Diabetes Maternal Grandmother   . Heart disease Maternal  Grandmother   . Hypertension Maternal Grandmother   . Kidney cancer Maternal Grandmother   . Diabetes Maternal Grandfather   . Heart disease Maternal Grandfather   . Hypertension Maternal Grandfather   . Diabetes Maternal Aunt   . Breast cancer Neg Hx   . Ovarian cancer Neg Hx   . Colon cancer Neg Hx     Current Outpatient Medications (Endocrine & Metabolic):  .  levonorgestrel (MIRENA) 20 MCG/24HR IUD, 1 each by Intrauterine route once. Marland Kitchen  SYNTHROID 25 MCG tablet, Take one tab daily .  predniSONE (DELTASONE) 20 MG tablet, Take 1 tablet (20 mg total) by mouth daily with breakfast.   Current Outpatient Medications (Respiratory):  .  benzonatate  (TESSALON) 100 MG capsule, Take 1 capsule (100 mg total) by mouth 2 (two) times daily as needed for cough.  Current Outpatient Medications (Analgesics):  .  aspirin-acetaminophen-caffeine (EXCEDRIN MIGRAINE) 250-250-65 MG tablet, Take by mouth every 6 (six) hours as needed for headache.  Current Outpatient Medications (Hematological):  .  cyanocobalamin (,VITAMIN B-12,) 1000 MCG/ML injection, Inject 1 mL (1,000 mcg total) into the muscle every 30 (thirty) days.  Current Outpatient Medications (Other):  Marland Kitchen  buPROPion (WELLBUTRIN XL) 150 MG 24 hr tablet, TAKE 2 TABLETS BY MOUTH DAILY .  gabapentin (NEURONTIN) 100 MG capsule, Take 2 capsules (200 mg total) by mouth at bedtime. .  phentermine 37.5 MG capsule, Take 1 capsule (37.5 mg total) by mouth every morning. Marland Kitchen  tiZANidine (ZANAFLEX) 4 MG tablet, Take 1 tablet (4 mg total) by mouth at bedtime.   Reviewed prior external information including notes and imaging from  primary care provider As well as notes that were available from care everywhere and other healthcare systems.  Past medical history, social, surgical and family history all reviewed in electronic medical record.  No pertanent information unless stated regarding to the chief complaint.   Review of Systems:  No headache, visual changes, nausea, vomiting, diarrhea, constipation, dizziness, abdominal pain, skin rash, fevers, chills, night sweats, weight loss, swollen lymph nodes,  joint swelling, chest pain, shortness of breath, mood changes. POSITIVE muscle aches, body aches, cough  Objective  Blood pressure 112/68, pulse 86, height 5\' 8"  (1.727 m), weight 187 lb (84.8 kg), SpO2 99 %.   General: No apparent distress alert and oriented x3 mood and affect normal, dressed appropriately.  HEENT: Pupils equal, extraocular movements intact  Respiratory: Patient's speak in full sentences and does not appear short of breath  Cardiovascular: No lower extremity edema, non tender, no  erythema  Neuro: Cranial nerves II through XII are intact, neurovascularly intact in all extremities with 2+ DTRs and 2+ pulses.  Gait normal with good balance and coordination.  MSK: Patient is sitting somewhat comfortably.  Patient still has some tenderness to palpation in the paraspinal musculature of the lumbar spine.  Patient does moving the legs fine.  Fairly unremarkable on exam today.    Impression and Recommendations:     The above documentation has been reviewed and is accurate and complete Lyndal Pulley, DO

## 2020-08-19 ENCOUNTER — Ambulatory Visit (INDEPENDENT_AMBULATORY_CARE_PROVIDER_SITE_OTHER): Payer: 59 | Admitting: Family Medicine

## 2020-08-19 ENCOUNTER — Other Ambulatory Visit: Payer: Self-pay

## 2020-08-19 ENCOUNTER — Encounter: Payer: Self-pay | Admitting: Family Medicine

## 2020-08-19 ENCOUNTER — Other Ambulatory Visit: Payer: Self-pay | Admitting: Family Medicine

## 2020-08-19 VITALS — BP 112/68 | HR 86 | Ht 68.0 in | Wt 187.0 lb

## 2020-08-19 DIAGNOSIS — Z87442 Personal history of urinary calculi: Secondary | ICD-10-CM | POA: Insufficient documentation

## 2020-08-19 DIAGNOSIS — M2550 Pain in unspecified joint: Secondary | ICD-10-CM

## 2020-08-19 DIAGNOSIS — N200 Calculus of kidney: Secondary | ICD-10-CM

## 2020-08-19 DIAGNOSIS — R058 Other specified cough: Secondary | ICD-10-CM | POA: Diagnosis not present

## 2020-08-19 LAB — COMPREHENSIVE METABOLIC PANEL
ALT: 35 U/L (ref 0–35)
AST: 21 U/L (ref 0–37)
Albumin: 4.5 g/dL (ref 3.5–5.2)
Alkaline Phosphatase: 59 U/L (ref 39–117)
BUN: 10 mg/dL (ref 6–23)
CO2: 27 mEq/L (ref 19–32)
Calcium: 9.7 mg/dL (ref 8.4–10.5)
Chloride: 103 mEq/L (ref 96–112)
Creatinine, Ser: 0.83 mg/dL (ref 0.40–1.20)
GFR: 94.11 mL/min (ref >60.00)
Glucose, Bld: 80 mg/dL (ref 70–99)
Potassium: 3.6 mEq/L (ref 3.5–5.1)
Sodium: 139 mEq/L (ref 135–145)
Total Bilirubin: 0.6 mg/dL (ref 0.2–1.2)
Total Protein: 7.2 g/dL (ref 6.0–8.3)

## 2020-08-19 LAB — CBC WITH DIFFERENTIAL/PLATELET
Basophils Absolute: 0 10*3/uL (ref 0.0–0.1)
Basophils Relative: 0.5 % (ref 0.0–3.0)
Eosinophils Absolute: 0.1 10*3/uL (ref 0.0–0.7)
Eosinophils Relative: 1.4 % (ref 0.0–5.0)
HCT: 37.3 % (ref 36.0–46.0)
Hemoglobin: 12.3 g/dL (ref 12.0–15.0)
Lymphocytes Relative: 34.3 % (ref 12.0–46.0)
Lymphs Abs: 1.7 10*3/uL (ref 0.7–4.0)
MCHC: 33.1 g/dL (ref 30.0–36.0)
MCV: 87.9 fl (ref 78.0–100.0)
Monocytes Absolute: 0.4 10*3/uL (ref 0.1–1.0)
Monocytes Relative: 8.5 % (ref 3.0–12.0)
Neutro Abs: 2.7 10*3/uL (ref 1.4–7.7)
Neutrophils Relative %: 55.3 % (ref 43.0–77.0)
Platelets: 174 10*3/uL (ref 150.0–400.0)
RBC: 4.24 Mil/uL (ref 3.87–5.11)
RDW: 12.7 % (ref 11.5–15.5)
WBC: 4.9 10*3/uL (ref 4.0–10.5)

## 2020-08-19 LAB — SEDIMENTATION RATE: Sed Rate: 15 mm/hr (ref 0–20)

## 2020-08-19 LAB — CK: Total CK: 64 U/L (ref 7–177)

## 2020-08-19 MED ORDER — BENZONATATE 100 MG PO CAPS: 100.0000 mg | ORAL_CAPSULE | Freq: Two times a day (BID) | ORAL | 0 refills | Status: DC | PRN

## 2020-08-19 MED ORDER — PREDNISONE 20 MG PO TABS: 20.0000 mg | ORAL_TABLET | Freq: Every day | ORAL | 0 refills | Status: DC

## 2020-08-19 NOTE — Assessment & Plan Note (Signed)
Had first kidney stone.  Could have been secondary to dehydration and the positive Covid.  Also could be secondary to the vitamin D supplementation will decrease to 1000 IUs daily.

## 2020-08-19 NOTE — Patient Instructions (Addendum)
Prednisone daily for 5 days, 20mg  T. Pearls up to 2x a day for cough CoQ10 200mg  Continue zinc and Vit C Send message in 2 weeks Decrease Vit D to 1000IU daily See me in 2-4 weeks

## 2020-08-19 NOTE — Assessment & Plan Note (Signed)
Post viral cough syndrome.  Patient is coughing which is seeming to irritate patient's back.  Patient is having pain in the legs bilaterally.  We have seen this post Covid.  Will rule out any myositis with some laboratory work-up today.  Discussed the gabapentin given short course of prednisone.  Tessalon Perles for the cough.  Continue vitamins.  Patient will follow up with me again in 3 to 4 weeks but send a message in 1 week to make sure she is feeling better.

## 2020-08-19 NOTE — Addendum Note (Signed)
Addended by: Trenda Moots on: [id number] 08:19 AM   Modules accepted: Orders

## 2020-09-07 ENCOUNTER — Encounter: Payer: 59 | Admitting: Certified Nurse Midwife

## 2020-09-08 ENCOUNTER — Encounter: Payer: Self-pay | Admitting: Family Medicine

## 2020-09-11 NOTE — Telephone Encounter (Signed)
Please contact patient to add to schedule on desired date. 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 Thanks, JML

## 2020-09-11 NOTE — Telephone Encounter (Signed)
Pt was call and lmtrc to make her next appt.

## 2020-09-15 NOTE — Progress Notes (Unsigned)
[facility] [address] [place] Phone: [phone] Subjective:   I [name] am serving as a Education administrator for Dr. Hulan Saas.  This visit occurred during the SARS-CoV-2 public health emergency.  Safety protocols were in place, including screening questions prior to the visit, additional usage of staff PPE, and extensive cleaning of exam room while observing appropriate contact time as indicated for disinfecting solutions.   I'm seeing this patient by the request  of:  Diona Fanti, CNM  CC: Low back pain follow-up  [id number]   08/19/2020 Post viral cough syndrome.  Patient is coughing which is seeming to irritate patient's back.  Patient is having pain in the legs bilaterally.  We have seen this post Covid.  Will rule out any myositis with some laboratory work-up today.  Discussed the gabapentin given short course of prednisone.  Tessalon Perles for the cough.  Continue vitamins.  Patient will follow up with me again in 3 to 4 weeks but send a message in 1 week to make sure she is feeling better.  Had first kidney stone.  Could have been secondary to dehydration and the positive Covid.  Also could be secondary to the vitamin D supplementation will decrease to 1000 IUs daily.  Update 09/16/2020 Jennifer Morse is a 31 y.o. female coming in with complaint of low back pain. Patient states she is in pain today. States the pain is worse in the morning. Every morning she is stiff and in pain.  Patient does state if anything she is only minimally better.  Patient feels like it is starting to hurt like her back table prior to the epidural she had in September.  Patient's MRI did show that she did have a bilateral S1 nerve impingement left greater than right.      Past Medical History:  Diagnosis Date  . Anxiety   . Degenerative lumbar disc   . Frequent headaches   . Gestational thrombocytopenia ([place])   . Lumbar herniated  disc   . Migraine with aura, intractable, with status migrainosus 1995  . Oligomenorrhea   . Postpartum depression    with first pregnancy   Past Surgical History:  Procedure Laterality Date  . WISDOM TOOTH EXTRACTION  2012   Social History   Socioeconomic History  . Marital status: Married    Spouse name: Edison Nasuti  . Number of children: 2  . Years of education: Not on file  . Highest education level: Not on file  Occupational History  . Occupation: Therapist, sports  Tobacco Use  . Smoking status: Former Smoker    Packs/day: 0.25    Types: Cigarettes    Quit date: 02/26/2015    Years since quitting: 5.5  . Smokeless tobacco: Never Used  Vaping Use  . Vaping Use: Never used  Substance and Sexual Activity  . Alcohol use: Not Currently    Alcohol/week: 1.0 standard drink    Types: 1 Glasses of wine per week  . Drug use: No  . Sexual activity: Yes    Partners: Male    Birth control/protection: I.U.D.  Other Topics Concern  . Not on file  Social History Narrative  . Not on file   Social Determinants of Health   Financial Resource Strain: Not on file  Food Insecurity: Not on file  Transportation Needs: Not on file  Physical Activity: Not on file  Stress: Not on file  Social Connections: Not on file   No Known  Allergies Family History  Problem Relation Age of Onset  . Diabetes Mother   . Diabetes Sister   . Diabetes Maternal Grandmother   . Heart disease Maternal Grandmother   . Hypertension Maternal Grandmother   . Kidney cancer Maternal Grandmother   . Diabetes Maternal Grandfather   . Heart disease Maternal Grandfather   . Hypertension Maternal Grandfather   . Diabetes Maternal Aunt   . Breast cancer Neg Hx   . Ovarian cancer Neg Hx   . Colon cancer Neg Hx     Current Outpatient Medications (Endocrine & Metabolic):  .  levonorgestrel (MIRENA) 20 MCG/24HR IUD, 1 each by Intrauterine route once. .  predniSONE (DELTASONE) 20 MG tablet, Take 1 tablet (20 mg total) by  mouth daily with breakfast. .  SYNTHROID 25 MCG tablet, Take one tab daily   Current Outpatient Medications (Respiratory):  .  benzonatate (TESSALON) 100 MG capsule, Take 1 capsule (100 mg total) by mouth 2 (two) times daily as needed for cough.  Current Outpatient Medications (Analgesics):  .  aspirin-acetaminophen-caffeine (EXCEDRIN MIGRAINE) 250-250-65 MG tablet, Take by mouth every 6 (six) hours as needed for headache. .  meloxicam (MOBIC) 15 MG tablet, Take 1 tablet (15 mg total) by mouth daily.  Current Outpatient Medications (Hematological):  .  cyanocobalamin (,VITAMIN B-12,) 1000 MCG/ML injection, Inject 1 mL (1,000 mcg total) into the muscle every 30 (thirty) days.  Current Outpatient Medications (Other):  Marland Kitchen  buPROPion (WELLBUTRIN XL) 150 MG 24 hr tablet, TAKE 2 TABLETS BY MOUTH DAILY .  gabapentin (NEURONTIN) 100 MG capsule, Take 2 capsules (200 mg total) by mouth at bedtime. .  phentermine 37.5 MG capsule, Take 1 capsule (37.5 mg total) by mouth every morning. Marland Kitchen  tiZANidine (ZANAFLEX) 4 MG tablet, Take 1 tablet (4 mg total) by mouth at bedtime.   Reviewed prior external information including notes and imaging from  primary care provider As well as notes that were available from care everywhere and other healthcare systems.  Past medical history, social, surgical and family history all reviewed in electronic medical record.  No pertanent information unless stated regarding to the chief complaint.   Review of Systems:  No headache, visual changes, nausea, vomiting, diarrhea, constipation, dizziness, abdominal pain, skin rash, fevers, chills, night sweats, weight loss, swollen lymph nodes, body aches, joint swelling, chest pain, shortness of breath, mood changes.  Any dysuria or hematuria POSITIVE muscle aches  Objective  Blood pressure 120/82, pulse 71, height 5\' 8"  (1.727 m), weight 180 lb (81.6 kg), SpO2 100 %.   General: No apparent distress alert and oriented x3 patient  does appear to be fairly uncomfortable. HEENT: Pupils equal, extraocular movements intact  Respiratory: Patient's speak in full sentences and does not appear short of breath  Cardiovascular: No lower extremity edema, non tender, no erythema  Gait normal with good balance and coordination.  MSK: Low back exam shows the patient does have loss of lordosis and does have voluntary guarding noted.  Positive straight leg test noted with forward flexion of 20 degrees in the L5 and S1 distributions.  Patient seems to be very uncomfortable with the testing.  Neurovascular intact distally no significant weakness of the lower extremity.  Deep tendon reflexes are intact    Impression and Recommendations:     The above documentation has been reviewed and is accurate and complete Lyndal Pulley, DO

## 2020-09-16 ENCOUNTER — Other Ambulatory Visit: Payer: Self-pay | Admitting: Family Medicine

## 2020-09-16 ENCOUNTER — Encounter: Payer: Self-pay | Admitting: Family Medicine

## 2020-09-16 ENCOUNTER — Ambulatory Visit (INDEPENDENT_AMBULATORY_CARE_PROVIDER_SITE_OTHER): Payer: 59 | Admitting: Family Medicine

## 2020-09-16 ENCOUNTER — Other Ambulatory Visit: Payer: Self-pay

## 2020-09-16 VITALS — BP 120/82 | HR 71 | Ht 68.0 in | Wt 180.0 lb

## 2020-09-16 DIAGNOSIS — M5416 Radiculopathy, lumbar region: Secondary | ICD-10-CM | POA: Diagnosis not present

## 2020-09-16 DIAGNOSIS — M545 Low back pain, unspecified: Secondary | ICD-10-CM

## 2020-09-16 DIAGNOSIS — G8929 Other chronic pain: Secondary | ICD-10-CM | POA: Diagnosis not present

## 2020-09-16 MED ORDER — MELOXICAM 15 MG PO TABS: 15.0000 mg | ORAL_TABLET | Freq: Every day | ORAL | 0 refills | Status: DC

## 2020-09-16 NOTE — Assessment & Plan Note (Signed)
Worsening pain again.  I do feel that patient had an exacerbation of her back secondary to the post viral cough.  Patient had done significantly better with the epidural previously and I like to repeat it.  Patient is still having some radicular symptoms left greater than right at this time.  We will order the epidural.  We discussed starting meloxicam and patient will continue with the gabapentin and the muscle relaxer at night.  Patient will discontinue the ibuprofen.  Patient knows if worsening pain to seek medical attention immediately.  Patient does work in [employer].  We discussed potentially holding her from her job which she declined.  Follow-up again in 3 weeks after injection

## 2020-09-16 NOTE — Patient Instructions (Signed)
Good to see you Epidural sent in call [facility] [phone] Meloxicam 15 mg Do not use NSAIDS such as Advil or Aleve when taking Meloxicam It is ok to use Tylenol for additional pain relief We can write a note for work See me again in 3 weeks after injection

## 2020-09-17 ENCOUNTER — Telehealth: Payer: Self-pay

## 2020-09-17 NOTE — Telephone Encounter (Signed)
mychart message sent to patient re: no visitors 

## 2020-09-18 ENCOUNTER — Ambulatory Visit: Payer: 59

## 2020-09-18 ENCOUNTER — Other Ambulatory Visit: Payer: Self-pay

## 2020-09-18 DIAGNOSIS — Z6830 Body mass index (BMI) 30.0-30.9, adult: Secondary | ICD-10-CM

## 2020-09-18 MED ORDER — CYANOCOBALAMIN 1000 MCG/ML IJ SOLN
1000.0000 ug | Freq: Once | INTRAMUSCULAR | Status: AC
Start: 2020-09-18 — End: 2020-09-18
  Administered 2020-09-18: 1000 ug via INTRAMUSCULAR

## 2020-09-18 NOTE — Progress Notes (Unsigned)
Pt presents for  Weight,B/P, B-12 injection. No side effects of medications-Phentermine or B-12. Weight loss 4 lbs. Encourage eating healthy and exercise. WC= 38.5"

## 2020-09-23 ENCOUNTER — Ambulatory Visit
Admission: RE | Admit: 2020-09-23 | Discharge: 2020-09-23 | Disposition: A | Discharge status: Home / Self Care | Payer: 59 | Source: Ambulatory Visit | Attending: Family Medicine | Admitting: Family Medicine

## 2020-09-23 ENCOUNTER — Other Ambulatory Visit: Payer: Self-pay

## 2020-09-23 DIAGNOSIS — M47816 Spondylosis without myelopathy or radiculopathy, lumbar region: Secondary | ICD-10-CM | POA: Diagnosis not present

## 2020-09-23 DIAGNOSIS — G8929 Other chronic pain: Secondary | ICD-10-CM

## 2020-09-23 DIAGNOSIS — M545 Low back pain, unspecified: Secondary | ICD-10-CM

## 2020-09-23 MED ORDER — IOPAMIDOL (ISOVUE-M 200) INJECTION 41%
1.0000 mL | Freq: Once | INTRAMUSCULAR | Status: AC
  Administered 2020-09-23: 1 mL via EPIDURAL

## 2020-09-23 MED ORDER — METHYLPREDNISOLONE ACETATE 40 MG/ML INJ SUSP (RADIOLOG
120.0000 mg | Freq: Once | INTRAMUSCULAR | Status: AC
  Administered 2020-09-23: 120 mg via EPIDURAL

## 2020-09-23 NOTE — Discharge Instructions (Signed)

## 2020-10-22 ENCOUNTER — Other Ambulatory Visit: Payer: Self-pay

## 2020-10-22 ENCOUNTER — Ambulatory Visit (INDEPENDENT_AMBULATORY_CARE_PROVIDER_SITE_OTHER): Payer: 59 | Admitting: Certified Nurse Midwife

## 2020-10-22 VITALS — BP 101/71 | HR 66 | Resp 16 | Ht 68.0 in | Wt 178.5 lb

## 2020-10-22 DIAGNOSIS — R634 Abnormal weight loss: Secondary | ICD-10-CM

## 2020-10-22 DIAGNOSIS — T50905A Adverse effect of unspecified drugs, medicaments and biological substances, initial encounter: Secondary | ICD-10-CM

## 2020-10-22 DIAGNOSIS — Z6827 Body mass index (BMI) 27.0-27.9, adult: Secondary | ICD-10-CM | POA: Diagnosis not present

## 2020-10-22 DIAGNOSIS — Z7689 Persons encountering health services in other specified circumstances: Secondary | ICD-10-CM | POA: Diagnosis not present

## 2020-10-22 MED ORDER — CYANOCOBALAMIN 1000 MCG/ML IJ SOLN
1000.0000 ug | Freq: Once | INTRAMUSCULAR | Status: AC
  Administered 2020-10-22: 1000 ug via INTRAMUSCULAR

## 2020-10-22 NOTE — Patient Instructions (Signed)
Calorie Counting for Weight Loss Calories are units of energy. Your body needs a certain number of calories from food to keep going throughout the day. When you eat or drink more calories than your body needs, your body stores the extra calories mostly as fat. When you eat or drink fewer calories than your body needs, your body burns fat to get the energy it needs. Calorie counting means keeping track of how many calories you eat and drink each day. Calorie counting can be helpful if you need to lose weight. If you eat fewer calories than your body needs, you should lose weight. Ask your health care provider what a healthy weight is for you. For calorie counting to work, you will need to eat the right number of calories each day to lose a healthy amount of weight per week. A dietitian can help you figure out how many calories you need in a day and will suggest ways to reach your calorie goal.  A healthy amount of weight to lose each week is usually 1-2 lb (0.5-0.9 kg). This usually means that your daily calorie intake should be reduced by 500-750 calories.  Eating 1,200-1,500 calories a day can help most women lose weight.  Eating 1,500-1,800 calories a day can help most men lose weight. What do I need to know about calorie counting? Work with your health care provider or dietitian to determine how many calories you should get each day. To meet your daily calorie goal, you will need to:  Find out how many calories are in each food that you would like to eat. Try to do this before you eat.  Decide how much of the food you plan to eat.  Keep a food log. Do this by writing down what you ate and how many calories it had. To successfully lose weight, it is important to balance calorie counting with a healthy lifestyle that includes regular activity. Where do I find calorie information? The number of calories in a food can be found on a Nutrition Facts label. If a food does not have a Nutrition Facts  label, try to look up the calories online or ask your dietitian for help. Remember that calories are listed per serving. If you choose to have more than one serving of a food, you will have to multiply the calories per serving by the number of servings you plan to eat. For example, the label on a package of bread might say that a serving size is 1 slice and that there are 90 calories in a serving. If you eat 1 slice, you will have eaten 90 calories. If you eat 2 slices, you will have eaten 180 calories.   How do I keep a food log? After each time that you eat, record the following in your food log as soon as possible:  What you ate. Be sure to include toppings, sauces, and other extras on the food.  How much you ate. This can be measured in cups, ounces, or number of items.  How many calories were in each food and drink.  The total number of calories in the food you ate. Keep your food log near you, such as in a pocket-sized notebook or on an app or website on your mobile phone. Some programs will calculate calories for you and show you how many calories you have left to meet your daily goal. What are some portion-control tips?  Know how many calories are in a serving. This will   help you know how many servings you can have of a certain food.  Use a measuring cup to measure serving sizes. You could also try weighing out portions on a kitchen scale. With time, you will be able to estimate serving sizes for some foods.  Take time to put servings of different foods on your favorite plates or in your favorite bowls and cups so you know what a serving looks like.  Try not to eat straight from a food's packaging, such as from a bag or box. Eating straight from the package makes it hard to see how much you are eating and can lead to overeating. Put the amount you would like to eat in a cup or on a plate to make sure you are eating the right portion.  Use smaller plates, glasses, and bowls for smaller  portions and to prevent overeating.  Try not to multitask. For example, avoid watching TV or using your computer while eating. If it is time to eat, sit down at a table and enjoy your food. This will help you recognize when you are full. It will also help you be more mindful of what and how much you are eating. What are tips for following this plan? Reading food labels  Check the calorie count compared with the serving size. The serving size may be smaller than what you are used to eating.  Check the source of the calories. Try to choose foods that are high in protein, fiber, and vitamins, and low in saturated fat, trans fat, and sodium. Shopping  Read nutrition labels while you shop. This will help you make healthy decisions about which foods to buy.  Pay attention to nutrition labels for low-fat or fat-free foods. These foods sometimes have the same number of calories or more calories than the full-fat versions. They also often have added sugar, starch, or salt to make up for flavor that was removed with the fat.  Make a grocery list of lower-calorie foods and stick to it. Cooking  Try to cook your favorite foods in a healthier way. For example, try baking instead of frying.  Use low-fat dairy products. Meal planning  Use more fruits and vegetables. One-half of your plate should be fruits and vegetables.  Include lean proteins, such as chicken, turkey, and fish. Lifestyle Each week, aim to do one of the following:  150 minutes of moderate exercise, such as walking.  75 minutes of vigorous exercise, such as running. General information  Know how many calories are in the foods you eat most often. This will help you calculate calorie counts faster.  Find a way of tracking calories that works for you. Get creative. Try different apps or programs if writing down calories does not work for you. What foods should I eat?  Eat nutritious foods. It is better to have a nutritious,  high-calorie food, such as an avocado, than a food with few nutrients, such as a bag of potato chips.  Use your calories on foods and drinks that will fill you up and will not leave you hungry soon after eating. ? Examples of foods that fill you up are nuts and nut butters, vegetables, lean proteins, and high-fiber foods such as whole grains. High-fiber foods are foods with more than 5 g of fiber per serving.  Pay attention to calories in drinks. Low-calorie drinks include water and unsweetened drinks. The items listed above may not be a complete list of foods and beverages you can eat.   Contact a dietitian for more information.   What foods should I limit? Limit foods or drinks that are not good sources of vitamins, minerals, or protein or that are high in unhealthy fats. These include:  Candy.  Other sweets.  Sodas, specialty coffee drinks, alcohol, and juice. The items listed above may not be a complete list of foods and beverages you should avoid. Contact a dietitian for more information. How do I count calories when eating out?  Pay attention to portions. Often, portions are much larger when eating out. Try these tips to keep portions smaller: ? Consider sharing a meal instead of getting your own. ? If you get your own meal, eat only half of it. Before you start eating, ask for a container and put half of your meal into it. ? When available, consider ordering smaller portions from the menu instead of full portions.  Pay attention to your food and drink choices. Knowing the way food is cooked and what is included with the meal can help you eat fewer calories. ? If calories are listed on the menu, choose the lower-calorie options. ? Choose dishes that include vegetables, fruits, whole grains, low-fat dairy products, and lean proteins. ? Choose items that are boiled, broiled, grilled, or steamed. Avoid items that are buttered, battered, fried, or served with cream sauce. Items labeled as  crispy are usually fried, unless stated otherwise. ? Choose water, low-fat milk, unsweetened iced tea, or other drinks without added sugar. If you want an alcoholic beverage, choose a lower-calorie option, such as a glass of wine or light beer. ? Ask for dressings, sauces, and syrups on the side. These are usually high in calories, so you should limit the amount you eat. ? If you want a salad, choose a garden salad and ask for grilled meats. Avoid extra toppings such as bacon, cheese, or fried items. Ask for the dressing on the side, or ask for olive oil and vinegar or lemon to use as dressing.  Estimate how many servings of a food you are given. Knowing serving sizes will help you be aware of how much food you are eating at restaurants. Where to find more information  Centers for Disease Control and Prevention: www.cdc.gov  [place] Department of Agriculture: myplate.gov Summary  Calorie counting means keeping track of how many calories you eat and drink each day. If you eat fewer calories than your body needs, you should lose weight.  A healthy amount of weight to lose per week is usually 1-2 lb (0.5-0.9 kg). This usually means reducing your daily calorie intake by 500-750 calories.  The number of calories in a food can be found on a Nutrition Facts label. If a food does not have a Nutrition Facts label, try to look up the calories online or ask your dietitian for help.  Use smaller plates, glasses, and bowls for smaller portions and to prevent overeating.  Use your calories on foods and drinks that will fill you up and not leave you hungry shortly after a meal. This information is not intended to replace advice given to you by your health care provider. Make sure you discuss any questions you have with your health care provider. Document Revised: 10/03/2019 Document Reviewed: 10/03/2019 Elsevier Patient Education  2021 Elsevier Inc.  

## 2020-10-22 NOTE — Progress Notes (Signed)
Pt presents for  Weight,B/P, B-12 injection. No side effects of medications-Phentermine or B-12. Weight loss 3 lbs. Encourage eating healthy and exercise. WC= 37.5

## 2020-10-23 NOTE — Progress Notes (Signed)
GYN ENCOUNTER NOTE  Subjective:       Jennifer Morse is a 30 y.o. G52P2001 female here for weight management visit.   Reports three (3) pound weight loss. No negative side effects from the medication.    Gynecologic History  No LMP recorded. (Menstrual status: IUD).  Contraception: IUD, Mirena  Last Pap: 02/2018. Results were: normal  Obstetric History  OB History  Gravida Para Term Preterm AB Living  2 2 2  0 0 1  SAB IAB Ectopic Multiple Live Births  0 0 0 0 1    # Outcome Date GA Lbr Len/2nd Weight Sex Delivery Anes PTL Lv  2 Term 07/08/17 [redacted]w[redacted]d 15:15 / 00:28 8 lb 2.9 oz (3.71 kg) M Vag-Spont EPI N LIV     Birth Comments: Dimpled cheek (right)  1 Term 07/01/15 [redacted]w[redacted]d 218:30 / 02:26 8 lb 2.9 oz (3.71 kg) F Vag-Spont EPI  LIV    Obstetric Comments  1st Menstrual Cycle:  15   1st Pregnancy:  24    Past Medical History:  Diagnosis Date  . Anxiety   . Degenerative lumbar disc   . Frequent headaches   . Gestational thrombocytopenia ([place])   . Lumbar herniated disc   . Migraine with aura, intractable, with status migrainosus 1995  . Oligomenorrhea   . Postpartum depression    with first pregnancy    Past Surgical History:  Procedure Laterality Date  . WISDOM TOOTH EXTRACTION  2012    Current Outpatient Medications on File Prior to Visit  Medication Sig Dispense Refill  . aspirin-acetaminophen-caffeine (EXCEDRIN MIGRAINE) 250-250-65 MG tablet Take by mouth every 6 (six) hours as needed for headache.    Marland Kitchen buPROPion (WELLBUTRIN XL) 150 MG 24 hr tablet TAKE 2 TABLETS BY MOUTH DAILY 60 tablet 5  . cyanocobalamin (,VITAMIN B-12,) 1000 MCG/ML injection Inject 1 mL (1,000 mcg total) into the muscle every 30 (thirty) days. 1 mL 2  . gabapentin (NEURONTIN) 100 MG capsule Take 2 capsules (200 mg total) by mouth at bedtime. 180 capsule 3  . levonorgestrel (MIRENA) 20 MCG/24HR IUD 1 each by Intrauterine route once.    . meloxicam (MOBIC) 15 MG tablet Take 1 tablet (15 mg  total) by mouth daily. 30 tablet 0  . phentermine 37.5 MG capsule Take 1 capsule (37.5 mg total) by mouth every morning. 30 capsule 2  . SYNTHROID 25 MCG tablet Take one tab daily 30 tablet 5  . tiZANidine (ZANAFLEX) 4 MG tablet Take 1 tablet (4 mg total) by mouth at bedtime. 30 tablet 1  . [DISCONTINUED] azelastine (ASTELIN) 0.1 % nasal spray Place 1 spray into both nostrils 2 (two) times daily. Use in each nostril as directed (Patient taking differently: No sig reported) 30 mL 12   No current facility-administered medications on file prior to visit.    No Known Allergies  Social History   Socioeconomic History  . Marital status: Married    Spouse name: Edison Nasuti  . Number of children: 2  . Years of education: Not on file  . Highest education level: Not on file  Occupational History  . Occupation: Therapist, sports  Tobacco Use  . Smoking status: Former Smoker    Packs/day: 0.25    Types: Cigarettes    Quit date: 02/26/2015    Years since quitting: 5.6  . Smokeless tobacco: Never Used  Vaping Use  . Vaping Use: Never used  Substance and Sexual Activity  . Alcohol use: Not Currently    Alcohol/week: 1.0  standard drink    Types: 1 Glasses of wine per week  . Drug use: No  . Sexual activity: Yes    Partners: Male    Birth control/protection: I.U.D.  Other Topics Concern  . Not on file  Social History Narrative  . Not on file   Social Determinants of Health   Financial Resource Strain: Not on file  Food Insecurity: Not on file  Transportation Needs: Not on file  Physical Activity: Not on file  Stress: Not on file  Social Connections: Not on file  Intimate Partner Violence: Not on file    Family History  Problem Relation Age of Onset  . Diabetes Mother   . Diabetes Sister   . Diabetes Maternal Grandmother   . Heart disease Maternal Grandmother   . Hypertension Maternal Grandmother   . Kidney cancer Maternal Grandmother   . Diabetes Maternal Grandfather   . Heart disease  Maternal Grandfather   . Hypertension Maternal Grandfather   . Diabetes Maternal Aunt   . Breast cancer Neg Hx   . Ovarian cancer Neg Hx   . Colon cancer Neg Hx     The following portions of the patient's history were reviewed and updated as appropriate: allergies, current medications, past family history, past medical history, past social history, past surgical history and problem list.  Review of Systems  ROS negative except as noted above. Information obtained from patient.   Objective:   BP 101/71   Pulse 66   Resp 16   Ht 5\' 8"  (1.727 m)   Wt 178 lb 8 oz (81 kg)   BMI 27.14 kg/m    CONSTITUTIONAL: Well-developed, well-nourished female in no acute distress.   WAIST CIRCUMFERENCE: 37.5 inches   Assessment:   1. Encounter for weight management  - cyanocobalamin ((VITAMIN B-12)) injection 1,000 mcg  2. Weight loss due to medication  - cyanocobalamin ((VITAMIN B-12)) injection 1,000 mcg  3. BMI 27.0-27.9,adult   Plan:   B-12 injection today, see chart.   Education regarding indication for phentermine, verbalized understanding.   Reviewed red flag symptoms and when to call.   RTC as needed.    Dani Gobble, CNM [facility], [facility]

## 2020-11-27 ENCOUNTER — Ambulatory Visit (INDEPENDENT_AMBULATORY_CARE_PROVIDER_SITE_OTHER): Payer: 59 | Admitting: Certified Nurse Midwife

## 2020-11-27 ENCOUNTER — Other Ambulatory Visit: Payer: Self-pay

## 2020-11-27 ENCOUNTER — Encounter: Payer: Self-pay | Admitting: Certified Nurse Midwife

## 2020-11-27 VITALS — BP 103/69 | HR 81 | Ht 68.0 in | Wt 176.9 lb

## 2020-11-27 DIAGNOSIS — R5383 Other fatigue: Secondary | ICD-10-CM

## 2020-11-27 DIAGNOSIS — Z8742 Personal history of other diseases of the female genital tract: Secondary | ICD-10-CM

## 2020-11-27 DIAGNOSIS — L659 Nonscarring hair loss, unspecified: Secondary | ICD-10-CM

## 2020-11-27 DIAGNOSIS — E042 Nontoxic multinodular goiter: Secondary | ICD-10-CM | POA: Diagnosis not present

## 2020-11-27 NOTE — Patient Instructions (Addendum)
Polycystic Ovary Syndrome  Polycystic ovarian syndrome (PCOS) is a common hormonal disorder among women of reproductive age. In most women with PCOS, small fluid-filled sacs (cysts) grow on the ovaries. PCOS can cause problems with menstrual periods and make it hard to get and stay pregnant. If this condition is not treated, it can lead to serious health problems, such as diabetes and heart disease. What are the causes? The cause of this condition is not known. It may be due to certain factors, such as:  Irregular menstrual cycle.  High levels of certain hormones.  Problems with the hormone that helps to control blood sugar (insulin).  Certain genes. What increases the risk? You are more likely to develop this condition if you:  Have a family history of PCOS or type 2 diabetes.  Are overweight, eat unhealthy foods, and are not active. These factors may cause problems with blood sugar control, which can contribute to PCOS or PCOS symptoms. What are the signs or symptoms? Symptoms of this condition include:  Ovarian cysts and sometimes pelvic pain.  Menstrual periods that are not regular or are too heavy.  Inability to get or stay pregnant.  Increased growth of hair on the face, chest, stomach, back, thumbs, thighs, or toes.  Acne or oily skin. Acne may develop during adulthood, and it may not get better with treatment.  Weight gain or obesity.  Patches of thickened and dark brown or black skin on the neck, arms, breasts, or thighs. How is this diagnosed? This condition is diagnosed based on:  Your medical history.  A physical exam that includes a pelvic exam. Your health care provider may look for areas of increased hair growth on your skin.  Tests, such as: ? An ultrasound to check the ovaries for cysts and to view the lining of the uterus. ? Blood tests to check levels of sugar (glucose), female hormone (testosterone), and female hormones (estrogen and progesterone). How  is this treated? There is no cure for this condition, but treatment can help to manage symptoms and prevent more health problems from developing. Treatment varies depending on your symptoms and if you want to have a baby or if you need birth control. Treatment may include:  Making nutrition and lifestyle changes.  Taking the progesterone hormone to start a menstrual period.  Taking birth control pills to help you have regular menstrual periods.  Taking medicines such as: ? Medicines to make you ovulate, if you want to get pregnant. ? Medicine to reduce extra hair growth.  Having surgery in severe cases. This may involve making small holes in one or both of your ovaries. This decreases the amount of testosterone that your body makes. Follow these instructions at home:  Take over-the-counter and prescription medicines only as told by your health care provider.  Follow a healthy meal plan that includes lean proteins, complex carbohydrates, fresh fruits and vegetables, low-fat dairy products, healthy fats, and fiber.  If you are overweight, lose weight as told by your health care provider. Your health care provider can determine how much weight loss is best for you and can help you lose weight safely.  Keep all follow-up visits. This is important. Contact a health care provider if:  Your symptoms do not get better with medicine.  Your symptoms get worse or you develop new symptoms. Summary  Polycystic ovarian syndrome (PCOS) is a common hormonal disorder among women of reproductive age.  PCOS can cause problems with menstrual periods and make it hard  to get and stay pregnant.  If this condition is not treated, it can lead to serious health problems, such as diabetes and heart disease.  There is no cure for this condition, but treatment can help to manage symptoms and prevent more health problems from developing. This information is not intended to replace advice given to you by your  health care provider. Make sure you discuss any questions you have with your health care provider. Document Revised: 01/30/2020 Document Reviewed: 01/30/2020 Elsevier Patient Education  [place].    Diet for Polycystic Ovary Syndrome Polycystic ovary syndrome (PCOS) is a common hormonal disorder that affects a woman's reproductive system. It can cause problems with menstrual periods and make it hard to get and stay pregnant. Changing what you eat can help your hormones reach normal levels, improve your health, and help you better manage PCOS. Following a balanced diet can help you lose weight and improve the way that your body uses the hormone insulin to control blood sugar. This may include:  Eating low-fat (lean) proteins, complex carbohydrates, fresh fruits and vegetables, low-fat dairy products, healthy fats, and fiber.  Cutting down on calories.  Exercising regularly. What are tips for following this plan?  Follow a balanced diet for meals and snacks. Eat breakfast, lunch, dinner, and one or two snacks every day.  Include protein in each meal and snack.  Choose whole grains instead of products that are made with refined flour.  Eat a variety of foods.  Exercise regularly as told by your health care provider. Aim to do at least 30 minutes of exercise on most days of the week.  If you are overweight or obese: ? Pay attention to how many calories you eat. Cutting down on calories can help you lose weight. ? Work with your health care provider or a dietitian to figure out how many calories you need each day. What foods should I eat? Fruits Include a variety of colors and types. All fruits are helpful for PCOS. Vegetables Include a variety of colors and types. All vegetables are helpful for PCOS. Grains Whole grains, such as whole wheat. Whole-grain breads, crackers, cereals, and pasta. Unsweetened oatmeal. Bulgur, barley, quinoa, and brown rice. Tortillas made from corn  or whole-wheat flour. Meats and other proteins Lean proteins, such as fish, chicken, beans, eggs, and tofu. Dairy Low-fat dairy products, such as skim milk, cheese sticks, and yogurt. Beverages Low-fat or fat-free drinks, such as water, low-fat milk, sugar-free drinks, and small amounts of 100% fruit juice. Seasonings and condiments Ketchup. Mustard. Barbecue sauce. Relish. Low-fat or fat-free mayonnaise. Fats and oils Olive oil or canola oil. Walnuts and almonds. The items listed above may not be a complete list of recommended foods and beverages. Contact a dietitian for more options.   What foods should I avoid? Foods that are high in calories or fat, especially saturated or trans fats. Fried foods. Sweets. Products that are made from refined white flour, including white bread, pastries, white rice, and pasta. The items listed above may not be a complete list of foods and beverages to avoid. Contact a dietitian for more information. Summary  PCOS is a hormonal imbalance that affects a woman's reproductive system. It can cause problems with menstrual periods and make it hard to get and stay pregnant.  You can help to manage your PCOS by exercising regularly and eating a healthy, varied diet of vegetables, fruit, whole grains, lean protein, and low-fat dairy products.  Changing what you eat  can improve the way that your body uses insulin, help your hormones reach normal levels, and help you lose weight. This information is not intended to replace advice given to you by your health care provider. Make sure you discuss any questions you have with your health care provider. Document Revised: 01/30/2020 Document Reviewed: 01/30/2020 Elsevier Patient Education  2021 [employer].

## 2020-11-27 NOTE — Progress Notes (Signed)
GYN ENCOUNTER NOTE  Subjective:       Jennifer Morse is a 30 y.o. G74P2001 female here for evaluation of hair loss and fatigue; requests labs.   History significant for novel COVID-19 virus, PCOS, and multinodular goiter.   Denies difficulty breathing or respiratory distress, chest pain, abdominal pain, vaginal bleeding, dysuria, and leg pain or swelling.    Gynecologic History  No LMP recorded (lmp unknown). (Menstrual status: IUD).  Contraception: IUD, Mirena  Last Pap: 02/2018. Results were: normal  Obstetric History  OB History  Gravida Para Term Preterm AB Living  2 2 2  0 0 1  SAB IAB Ectopic Multiple Live Births  0 0 0 0 1    # Outcome Date GA Lbr Len/2nd Weight Sex Delivery Anes PTL Lv  2 Term 07/08/17 [redacted]w[redacted]d 15:15 / 00:28 8 lb 2.9 oz (3.71 kg) M Vag-Spont EPI N LIV     Birth Comments: Dimpled cheek (right)  1 Term 07/01/15 [redacted]w[redacted]d 218:30 / 02:26 8 lb 2.9 oz (3.71 kg) F Vag-Spont EPI  LIV    Obstetric Comments  1st Menstrual Cycle:  15   1st Pregnancy:  24    Past Medical History:  Diagnosis Date  . Anxiety   . Degenerative lumbar disc   . Frequent headaches   . Gestational thrombocytopenia ([place])   . Lumbar herniated disc   . Migraine with aura, intractable, with status migrainosus 1995  . Oligomenorrhea   . Postpartum depression    with first pregnancy    Past Surgical History:  Procedure Laterality Date  . WISDOM TOOTH EXTRACTION  2012    Current Outpatient Medications on File Prior to Visit  Medication Sig Dispense Refill  . aspirin-acetaminophen-caffeine (EXCEDRIN MIGRAINE) 250-250-65 MG tablet Take by mouth every 6 (six) hours as needed for headache.    Marland Kitchen buPROPion (WELLBUTRIN XL) 150 MG 24 hr tablet TAKE 2 TABLETS BY MOUTH DAILY 60 tablet 5  . gabapentin (NEURONTIN) 100 MG capsule Take 2 capsules (200 mg total) by mouth at bedtime. 180 capsule 3  . levonorgestrel (MIRENA) 20 MCG/24HR IUD 1 each by Intrauterine route once.    . meloxicam (MOBIC)  15 MG tablet Take 1 tablet (15 mg total) by mouth daily. 30 tablet 0  . SYNTHROID 25 MCG tablet Take one tab daily 30 tablet 5  . tiZANidine (ZANAFLEX) 4 MG tablet Take 1 tablet (4 mg total) by mouth at bedtime. 30 tablet 1  . [DISCONTINUED] azelastine (ASTELIN) 0.1 % nasal spray Place 1 spray into both nostrils 2 (two) times daily. Use in each nostril as directed (Patient taking differently: No sig reported) 30 mL 12   No current facility-administered medications on file prior to visit.    No Known Allergies  Social History   Socioeconomic History  . Marital status: Married    Spouse name: Edison Nasuti  . Number of children: 2  . Years of education: Not on file  . Highest education level: Not on file  Occupational History  . Occupation: Therapist, sports  Tobacco Use  . Smoking status: Former Smoker    Packs/day: 0.25    Types: Cigarettes    Quit date: 02/26/2015    Years since quitting: 5.7  . Smokeless tobacco: Never Used  Vaping Use  . Vaping Use: Never used  Substance and Sexual Activity  . Alcohol use: Not Currently    Alcohol/week: 1.0 standard drink    Types: 1 Glasses of wine per week  . Drug use: No  .  Sexual activity: Yes    Partners: Male    Birth control/protection: I.U.D.  Other Topics Concern  . Not on file  Social History Narrative  . Not on file   Social Determinants of Health   Financial Resource Strain: Not on file  Food Insecurity: Not on file  Transportation Needs: Not on file  Physical Activity: Not on file  Stress: Not on file  Social Connections: Not on file  Intimate Partner Violence: Not on file    Family History  Problem Relation Age of Onset  . Diabetes Mother   . Diabetes Sister   . Diabetes Maternal Grandmother   . Heart disease Maternal Grandmother   . Hypertension Maternal Grandmother   . Kidney cancer Maternal Grandmother   . Diabetes Maternal Grandfather   . Heart disease Maternal Grandfather   . Hypertension Maternal Grandfather   . Diabetes  Maternal Aunt   . Breast cancer Neg Hx   . Ovarian cancer Neg Hx   . Colon cancer Neg Hx     The following portions of the patient's history were reviewed and updated as appropriate: allergies, current medications, past family history, past medical history, past social history, past surgical history and problem list.  Review of Systems  ROS negative except as noted above. Information obtained from patient.   Objective:   BP 103/69   Pulse 81   Ht 5\' 8"  (1.727 m)   Wt 176 lb 14.4 oz (80.2 kg)   LMP  (LMP Unknown)   BMI 26.90 kg/m    CONSTITUTIONAL: Well-developed, well-nourished female in no acute distress.   PHYSICAL EXAM: Not indicated.   Assessment:   1. Multinodular goiter  - TSH+T4F+T3Free - VITAMIN D 25 Hydroxy (Vit-D Deficiency, Fractures) - CBC  2. Hair loss  - TSH+T4F+T3Free - VITAMIN D 25 Hydroxy (Vit-D Deficiency, Fractures) - Comprehensive metabolic panel - Testosterone, Free, Total, SHBG - FSH/LH - DHEA-sulfate - Prolactin - CBC - Hemoglobin A1c  3. Other fatigue  - TSH+T4F+T3Free - VITAMIN D 25 Hydroxy (Vit-D Deficiency, Fractures) - Testosterone, Free, Total, SHBG - CBC - Hemoglobin A1c  4. History of PCOS  - Comprehensive metabolic panel - Testosterone, Free, Total, SHBG - FSH/LH - DHEA-sulfate - Prolactin - CBC - Hemoglobin A1c   Plan:   Labs today, see orders.   CNM will contact patient with results and establish plan of care.   Reviewed red flag symptoms and when to call.   RTC as previously scheduled or sooner if needed.    Dani Gobble, CNM [facility], [facility] 11/27/20 4:48 PM

## 2020-11-30 LAB — COMPREHENSIVE METABOLIC PANEL
ALT: 17 IU/L (ref 0–32)
AST: 17 IU/L (ref 0–40)
Albumin/Globulin Ratio: 2.1 (ref 1.2–2.2)
Albumin: 4.4 g/dL (ref 3.8–4.8)
Alkaline Phosphatase: 78 IU/L (ref 44–121)
BUN/Creatinine Ratio: 10 (ref 9–23)
BUN: 10 mg/dL (ref 6–20)
Bilirubin Total: 0.2 mg/dL (ref 0.0–1.2)
CO2: 23 mmol/L (ref 20–29)
Calcium: 9.1 mg/dL (ref 8.7–10.2)
Chloride: 101 mmol/L (ref 96–106)
Creatinine, Ser: 0.96 mg/dL (ref 0.57–1.00)
Globulin, Total: 2.1 g/dL (ref 1.5–4.5)
Glucose: 83 mg/dL (ref 65–99)
Potassium: 4.1 mmol/L (ref 3.5–5.2)
Sodium: 142 mmol/L (ref 134–144)
Total Protein: 6.5 g/dL (ref 6.0–8.5)
eGFR: 81 mL/min/{1.73_m2} (ref >59)

## 2020-11-30 LAB — TESTOSTERONE, FREE, TOTAL, SHBG
Sex Hormone Binding: 54.7 nmol/L (ref 24.6–122.0)
Testosterone, Free: 0.7 pg/mL (ref 0.0–4.2)
Testosterone: 16 ng/dL (ref 8–60)

## 2020-11-30 LAB — PROLACTIN: Prolactin: 12.9 ng/mL (ref 4.8–23.3)

## 2020-11-30 LAB — CBC
Hematocrit: 39.1 % (ref 34.0–46.6)
Hemoglobin: 13 g/dL (ref 11.1–15.9)
MCH: 29.4 pg (ref 26.6–33.0)
MCHC: 33.2 g/dL (ref 31.5–35.7)
MCV: 89 fL (ref 79–97)
Platelets: 146 10*3/uL — ABNORMAL LOW (ref 150–450)
RBC: 4.42 x10E6/uL (ref 3.77–5.28)
RDW: 12.2 % (ref 11.7–15.4)
WBC: 5.5 10*3/uL (ref 3.4–10.8)

## 2020-11-30 LAB — DHEA-SULFATE: DHEA-SO4: 215 ug/dL (ref 84.8–378.0)

## 2020-11-30 LAB — FSH/LH
FSH: 6 m[IU]/mL
LH: 9.7 m[IU]/mL

## 2020-11-30 LAB — TSH+T4F+T3FREE
Free T4: 1.41 ng/dL (ref 0.82–1.77)
T3, Free: 3 pg/mL (ref 2.0–4.4)
TSH: 1.05 u[IU]/mL (ref 0.450–4.500)

## 2020-11-30 LAB — HEMOGLOBIN A1C
Est. average glucose Bld gHb Est-mCnc: 108 mg/dL
Hgb A1c MFr Bld: 5.4 % (ref 4.8–5.6)

## 2020-11-30 LAB — VITAMIN D 25 HYDROXY (VIT D DEFICIENCY, FRACTURES): Vit D, 25-Hydroxy: 31.1 ng/mL (ref 30.0–100.0)

## 2021-01-05 ENCOUNTER — Other Ambulatory Visit: Payer: Self-pay

## 2021-01-05 MED FILL — Bupropion HCl Tab ER 24HR 150 MG: ORAL | 30 days supply | Qty: 60 | Fill #0 | Status: CN

## 2021-01-18 ENCOUNTER — Other Ambulatory Visit: Payer: Self-pay

## 2021-01-27 ENCOUNTER — Other Ambulatory Visit: Payer: Self-pay

## 2021-01-27 MED FILL — Bupropion HCl Tab ER 24HR 150 MG: ORAL | 30 days supply | Qty: 60 | Fill #0 | Status: AC

## 2021-03-04 ENCOUNTER — Other Ambulatory Visit (HOSPITAL_COMMUNITY)
Admission: RE | Admit: 2021-03-04 | Discharge: 2021-03-04 | Disposition: A | Discharge status: Home / Self Care | Payer: 59 | Source: Ambulatory Visit | Attending: Certified Nurse Midwife | Admitting: Certified Nurse Midwife

## 2021-03-04 ENCOUNTER — Encounter: Payer: Self-pay | Admitting: Certified Nurse Midwife

## 2021-03-04 ENCOUNTER — Other Ambulatory Visit: Payer: Self-pay

## 2021-03-04 ENCOUNTER — Ambulatory Visit (INDEPENDENT_AMBULATORY_CARE_PROVIDER_SITE_OTHER): Payer: 59 | Admitting: Certified Nurse Midwife

## 2021-03-04 VITALS — BP 100/61 | HR 78 | Resp 16 | Ht 68.0 in | Wt 180.4 lb

## 2021-03-04 DIAGNOSIS — Z01419 Encounter for gynecological examination (general) (routine) without abnormal findings: Secondary | ICD-10-CM | POA: Diagnosis not present

## 2021-03-04 DIAGNOSIS — Z124 Encounter for screening for malignant neoplasm of cervix: Secondary | ICD-10-CM | POA: Insufficient documentation

## 2021-03-04 DIAGNOSIS — Z975 Presence of (intrauterine) contraceptive device: Secondary | ICD-10-CM | POA: Insufficient documentation

## 2021-03-04 MED ORDER — BUPROPION HCL ER (XL) 150 MG PO TB24
ORAL_TABLET | Freq: Every day | ORAL | 4 refills | Status: DC
  Filled 2021-03-04: qty 180, 90d supply, fill #0

## 2021-03-04 NOTE — Patient Instructions (Signed)
Preventive Care 21-32 Years Old, Female Preventive care refers to lifestyle choices and visits with your health care provider that can promote health and wellness. This includes: A yearly physical exam. This is also called an annual wellness visit. Regular dental and eye exams. Immunizations. Screening for certain conditions. Healthy lifestyle choices, such as: Eating a healthy diet. Getting regular exercise. Not using drugs or products that contain nicotine and tobacco. Limiting alcohol use. What can I expect for my preventive care visit? Physical exam Your health care provider may check your: Height and weight. These may be used to calculate your BMI (body mass index). BMI is a measurement that tells if you are at a healthy weight. Heart rate and blood pressure. Body temperature. Skin for abnormal spots. Counseling Your health care provider may ask you questions about your: Past medical problems. Family's medical history. Alcohol, tobacco, and drug use. Emotional well-being. Home life and relationship well-being. Sexual activity. Diet, exercise, and sleep habits. Work and work environment. Access to firearms. Method of birth control. Menstrual cycle. Pregnancy history. What immunizations do I need?  Vaccines are usually given at various ages, according to a schedule. Your health care provider will recommend vaccines for you based on your age, medicalhistory, and lifestyle or other factors, such as travel or where you work. What tests do I need?  Blood tests Lipid and cholesterol levels. These may be checked every 5 years starting at age 20. Hepatitis C test. Hepatitis B test. Screening Diabetes screening. This is done by checking your blood sugar (glucose) after you have not eaten for a while (fasting). STD (sexually transmitted disease) testing, if you are at risk. BRCA-related cancer screening. This may be done if you have a family history of breast, ovarian, tubal, or  peritoneal cancers. Pelvic exam and Pap test. This may be done every 3 years starting at age 21. Starting at age 30, this may be done every 5 years if you have a Pap test in combination with an HPV test. Talk with your health care provider about your test results, treatment options,and if necessary, the need for more tests. Follow these instructions at home: Eating and drinking  Eat a healthy diet that includes fresh fruits and vegetables, whole grains, lean protein, and low-fat dairy products. Take vitamin and mineral supplements as recommended by your health care provider. Do not drink alcohol if: Your health care provider tells you not to drink. You are pregnant, may be pregnant, or are planning to become pregnant. If you drink alcohol: Limit how much you have to 0-1 drink a day. Be aware of how much alcohol is in your drink. In the [place] one drink equals one 12 oz bottle of beer (355 mL), one 5 oz glass of wine (148 mL), or one 1 oz glass of hard liquor (44 mL).  Lifestyle Take daily care of your teeth and gums. Brush your teeth every morning and night with fluoride toothpaste. Floss one time each day. Stay active. Exercise for at least 30 minutes 5 or more days each week. Do not use any products that contain nicotine or tobacco, such as cigarettes, e-cigarettes, and chewing tobacco. If you need help quitting, ask your health care provider. Do not use drugs. If you are sexually active, practice safe sex. Use a condom or other form of protection to prevent STIs (sexually transmitted infections). If you do not wish to become pregnant, use a form of birth control. If you plan to become pregnant, see your health care   provider for a prepregnancy visit. Find healthy ways to cope with stress, such as: Meditation, yoga, or listening to music. Journaling. Talking to a trusted person. Spending time with friends and family. Safety Always wear your seat belt while driving or riding in a  vehicle. Do not drive: If you have been drinking alcohol. Do not ride with someone who has been drinking. When you are tired or distracted. While texting. Wear a helmet and other protective equipment during sports activities. If you have firearms in your house, make sure you follow all gun safety procedures. Seek help if you have been physically or sexually abused. What's next? Go to your health care provider once a year for an annual wellness visit. Ask your health care provider how often you should have your eyes and teeth checked. Stay up to date on all vaccines. This information is not intended to replace advice given to you by your health care provider. Make sure you discuss any questions you have with your healthcare provider. Document Revised: 04/19/2020 Document Reviewed: 05/03/2018 Elsevier Patient Education  2022 [employer].

## 2021-03-04 NOTE — Progress Notes (Signed)
ANNUAL PREVENTATIVE CARE GYN  ENCOUNTER NOTE  Subjective:       Jennifer Morse is a 30 y.o. G54P2002 female here for a routine annual gynecologic exam. No current complaints or concerns.  Denies difficulty breathing or respiratory distress, chest pain, abdominal pain, excessive vaginal bleeding, dysuria, and leg pain or swelling.    Gynecologic History  Patient's last menstrual period was 02/06/2021 (exact date). Period Duration (Days): 3-4 Period Pattern: (!) Irregular Menstrual Flow: Light Menstrual Control: Panty liner, Thin pad  Contraception: IUD, Mirena inserted 08/2017  Last Pap: 02/2018. Results were: normal  Obstetric History  OB History  Gravida Para Term Preterm AB Living  2 2 2  0 0 2  SAB IAB Ectopic Multiple Live Births  0 0 0 0 2    # Outcome Date GA Lbr Len/2nd Weight Sex Delivery Anes PTL Lv  2 Term 07/08/17 [redacted]w[redacted]d 15:15 / 00:28 8 lb 2.9 oz (3.71 kg) M Vag-Spont EPI N LIV     Birth Comments: Dimpled cheek (right)  1 Term 07/01/15 [redacted]w[redacted]d 218:30 / 02:26 8 lb 2.9 oz (3.71 kg) F Vag-Spont EPI  LIV    Obstetric Comments  1st Menstrual Cycle:  15   1st Pregnancy:  24    Past Medical History:  Diagnosis Date   Anxiety    Degenerative lumbar disc    Frequent headaches    Gestational thrombocytopenia ([facility])    Lumbar herniated disc    Migraine with aura, intractable, with status migrainosus 1995   Oligomenorrhea    Postpartum depression    with first pregnancy    Past Surgical History:  Procedure Laterality Date   WISDOM TOOTH EXTRACTION  2012    Current Outpatient Medications on File Prior to Visit  Medication Sig Dispense Refill   aspirin-acetaminophen-caffeine (EXCEDRIN MIGRAINE) 250-250-65 MG tablet Take by mouth every 6 (six) hours as needed for headache.     buPROPion (WELLBUTRIN XL) 150 MG 24 hr tablet TAKE 2 TABLETS BY MOUTH DAILY 60 tablet 5   gabapentin (NEURONTIN) 100 MG capsule TAKE 2 CAPSULES (200 MG TOTAL) BY MOUTH AT BEDTIME. 180  capsule 3   levonorgestrel (MIRENA) 20 MCG/24HR IUD 1 each by Intrauterine route once.     meloxicam (MOBIC) 15 MG tablet TAKE 1 TABLET (15 MG TOTAL) BY MOUTH DAILY. 30 tablet 0   SYNTHROID 25 MCG tablet Take one tab daily 30 tablet 5   tiZANidine (ZANAFLEX) 4 MG tablet TAKE 1 TABLET BY MOUTH AT BEDTIME. 30 tablet 1   [DISCONTINUED] azelastine (ASTELIN) 0.1 % nasal spray Place 1 spray into both nostrils 2 (two) times daily. Use in each nostril as directed (Patient taking differently: No sig reported) 30 mL 12   [DISCONTINUED] phentermine 37.5 MG capsule Take 1 capsule (37.5 mg total) by mouth every morning. 30 capsule 2   No current facility-administered medications on file prior to visit.    No Known Allergies  Social History   Socioeconomic History   Marital status: Married    Spouse name: Edison Nasuti   Number of children: 2   Years of education: Not on file   Highest education level: Not on file  Occupational History   Occupation: RN  Tobacco Use   Smoking status: Former    Packs/day: 0.25    Pack years: 0.00    Types: Cigarettes    Quit date: 02/26/2015    Years since quitting: 6.0   Smokeless tobacco: Never  Vaping Use   Vaping Use: Never used  Substance and Sexual Activity   Alcohol use: Not Currently    Alcohol/week: 1.0 standard drink    Types: 1 Glasses of wine per week   Drug use: No   Sexual activity: Yes    Partners: Male    Birth control/protection: I.U.D.  Other Topics Concern   Not on file  Social History Narrative   Not on file   Social Determinants of Health   Financial Resource Strain: Not on file  Food Insecurity: Not on file  Transportation Needs: Not on file  Physical Activity: Not on file  Stress: Not on file  Social Connections: Not on file  Intimate Partner Violence: Not on file    Family History  Problem Relation Age of Onset   Diabetes Mother    Diabetes Sister    Diabetes Maternal Grandmother    Heart disease Maternal Grandmother     Hypertension Maternal Grandmother    Kidney cancer Maternal Grandmother    Diabetes Maternal Grandfather    Heart disease Maternal Grandfather    Hypertension Maternal Grandfather    Diabetes Maternal Aunt    Breast cancer Neg Hx    Ovarian cancer Neg Hx    Colon cancer Neg Hx     The following portions of the patient's history were reviewed and updated as appropriate: allergies, current medications, past family history, past medical history, past social history, past surgical history and problem list.  Review of Systems  ROS negative except as noted above. Information obtained from patient.    Objective:   BP 100/61   Pulse 78   Resp 16   Ht 5\' 8"  (1.727 m)   Wt 180 lb 6.4 oz (81.8 kg)   LMP 02/06/2021 (Exact Date)   BMI 27.43 kg/m   CONSTITUTIONAL: Well-developed, well-nourished female in no acute distress.   PSYCHIATRIC: Normal mood and affect. Normal behavior. Normal judgment and thought content.  [place]: Alert and oriented to person, place, and time. Normal muscle tone coordination. No cranial nerve deficit noted.  HENT:  Normocephalic, atraumatic.  EYES: Conjunctivae and EOM are normal.   NECK: Normal range of motion, supple, no masses.  Normal thyroid.   SKIN: Skin is warm and dry. No rash noted. Not diaphoretic. No erythema. No pallor. Professional tattoos present.   CARDIOVASCULAR: Normal heart rate noted, regular rhythm, no murmur.  RESPIRATORY: Clear to auscultation bilaterally. Effort and breath sounds normal, no problems with respiration noted.  BREASTS: Symmetric in size. No masses, skin changes, nipple drainage, or lymphadenopathy.  ABDOMEN: Soft, normal bowel sounds, no distention noted.  No tenderness, rebound or guarding.   PELVIC:  External Genitalia: Normal  Vagina: Normal  Cervix: Normal, IUD strings present, PAP collected  Uterus: Normal  Adnexa: Normal  MUSCULOSKELETAL: Normal range of motion. No tenderness.  No cyanosis, clubbing, or  edema.  2+ distal pulses.  LYMPHATIC: No Axillary, Supraclavicular, or Inguinal Adenopathy.  Depression screen [facility] 2/9 03/04/2021 11/27/2020 10/22/2020 05/26/2020 02/27/2020  Decreased Interest 0 1 0 1 0  Down, Depressed, Hopeless 1 0 1 1 1   PHQ - 2 Score 1 1 1 2 1   Altered sleeping 1 0 0 1 0  Tired, decreased energy 0 0 0 0 0  Change in appetite 0 0 0 0 0  Feeling bad or failure about yourself  1 0 1 0 1  Trouble concentrating 0 0 0 0 0  Moving slowly or fidgety/restless 0 0 0 0 0  Suicidal thoughts - 0 0 0 0  PHQ-9  Score 3 1 2 3 2   Difficult doing work/chores Somewhat difficult Not difficult at all Somewhat difficult Somewhat difficult Somewhat difficult  Some recent data might be hidden     Assessment:   Annual gynecologic examination 30 y.o.  Contraception: IUD, Mirena  Overweight  Problem List Items Addressed This Visit       Other   IUD (intrauterine device) in place   Relevant Orders   Cytology - PAP   Other Visit Diagnoses     Well woman exam    -  Primary   Relevant Orders   Cytology - PAP   Cervical cancer screening       Relevant Orders   Cytology - PAP       Plan:   Pap: Pap Co Test  Labs: Declined  Routine preventative health maintenance measures emphasized: Exercise/Diet/Weight control, Tobacco Warnings, Alcohol/Substance use risks, and Stress Management; see AVS  Reviewed red flag symptoms and when to call  Return to [place] for [employer] or sooner if needed   Dani Gobble, CNM  [facility], [facility] 03/04/21 1:57 PM

## 2021-03-12 LAB — CYTOLOGY - PAP
Comment: NEGATIVE
Diagnosis: NEGATIVE
High risk HPV: NEGATIVE

## 2021-03-15 ENCOUNTER — Other Ambulatory Visit: Payer: Self-pay

## 2021-03-19 ENCOUNTER — Encounter: Payer: 59 | Admitting: Certified Nurse Midwife

## 2021-04-18 ENCOUNTER — Other Ambulatory Visit: Payer: Self-pay | Admitting: Obstetrics and Gynecology

## 2021-04-18 DIAGNOSIS — R1011 Right upper quadrant pain: Secondary | ICD-10-CM

## 2021-04-18 DIAGNOSIS — R110 Nausea: Secondary | ICD-10-CM

## 2021-04-20 ENCOUNTER — Ambulatory Visit
Admission: RE | Admit: 2021-04-20 | Discharge: 2021-04-20 | Disposition: A | Discharge status: Home / Self Care | Payer: 59 | Source: Ambulatory Visit | Attending: Obstetrics and Gynecology | Admitting: Obstetrics and Gynecology

## 2021-04-20 ENCOUNTER — Other Ambulatory Visit: Payer: Self-pay

## 2021-04-20 ENCOUNTER — Other Ambulatory Visit: Payer: Self-pay | Admitting: Obstetrics and Gynecology

## 2021-04-20 DIAGNOSIS — R1011 Right upper quadrant pain: Secondary | ICD-10-CM | POA: Insufficient documentation

## 2021-04-20 DIAGNOSIS — R109 Unspecified abdominal pain: Secondary | ICD-10-CM | POA: Diagnosis not present

## 2021-04-20 DIAGNOSIS — R110 Nausea: Secondary | ICD-10-CM

## 2021-04-20 DIAGNOSIS — R112 Nausea with vomiting, unspecified: Secondary | ICD-10-CM | POA: Diagnosis not present

## 2021-04-20 DIAGNOSIS — Z0000 Encounter for general adult medical examination without abnormal findings: Secondary | ICD-10-CM

## 2021-05-19 ENCOUNTER — Other Ambulatory Visit: Payer: Self-pay

## 2021-06-16 DIAGNOSIS — H5203 Hypermetropia, bilateral: Secondary | ICD-10-CM | POA: Diagnosis not present

## 2021-07-06 ENCOUNTER — Other Ambulatory Visit: Payer: Self-pay

## 2021-07-06 ENCOUNTER — Other Ambulatory Visit: Payer: Self-pay | Admitting: Obstetrics and Gynecology

## 2021-07-06 DIAGNOSIS — Z1371 Encounter for nonprocreative screening for genetic disease carrier status: Secondary | ICD-10-CM

## 2021-07-06 HISTORY — DX: Encounter for nonprocreative screening for genetic disease carrier status: Z13.71

## 2021-07-06 MED ORDER — BUPROPION HCL ER (XL) 150 MG PO TB24
150.0000 mg | ORAL_TABLET | Freq: Every day | ORAL | 2 refills | Status: DC
Start: 2021-07-06 — End: 2021-07-22
  Filled 2021-07-06: qty 30, 30d supply, fill #0

## 2021-07-09 ENCOUNTER — Ambulatory Visit: Payer: 59

## 2021-07-09 ENCOUNTER — Other Ambulatory Visit: Payer: Self-pay

## 2021-07-09 DIAGNOSIS — Z8041 Family history of malignant neoplasm of ovary: Secondary | ICD-10-CM | POA: Diagnosis not present

## 2021-07-22 ENCOUNTER — Ambulatory Visit: Payer: 59 | Admitting: Obstetrics and Gynecology

## 2021-07-22 ENCOUNTER — Encounter: Payer: Self-pay | Admitting: Obstetrics and Gynecology

## 2021-07-22 ENCOUNTER — Other Ambulatory Visit: Payer: Self-pay

## 2021-07-22 ENCOUNTER — Ambulatory Visit (INDEPENDENT_AMBULATORY_CARE_PROVIDER_SITE_OTHER): Payer: 59 | Admitting: Obstetrics and Gynecology

## 2021-07-22 DIAGNOSIS — F411 Generalized anxiety disorder: Secondary | ICD-10-CM | POA: Diagnosis not present

## 2021-07-22 MED ORDER — TRAZODONE HCL 50 MG PO TABS
50.0000 mg | ORAL_TABLET | Freq: Every evening | ORAL | 2 refills | Status: DC | PRN
  Filled 2021-07-22: qty 30, 30d supply, fill #0

## 2021-07-22 MED ORDER — BUPROPION HCL ER (XL) 300 MG PO TB24
300.0000 mg | ORAL_TABLET | Freq: Every day | ORAL | 2 refills | Status: DC
  Filled 2021-07-22: qty 30, 30d supply, fill #0

## 2021-07-22 MED ORDER — HYDROXYZINE HCL 25 MG PO TABS
25.0000 mg | ORAL_TABLET | Freq: Four times a day (QID) | ORAL | 2 refills | Status: DC | PRN
  Filled 2021-07-22: qty 30, 8d supply, fill #0

## 2021-07-22 NOTE — Progress Notes (Signed)
I connected with Jennifer Morse on 07/22/2021 at  9:50 AM EST by telephone and verified that I am speaking with the correct person using two identifiers.   I discussed the limitations, risks, security and privacy concerns of performing an evaluation and management service by telephone and the availability of in person appointments. I also discussed with the patient that there may be a patient responsible charge related to this service. The patient expressed understanding and agreed to proceed.  The patient was at home I spoke with the patient from my workstation phone The names of people involved in this encounter were: Jennifer Morse , and [facility] Visit   Chief Complaint:  Chief Complaint  Patient presents with   Follow-up    Phone visit - medication follow. Need Rx for 376m.    History of Present Illness: The patient is a 30y.o. female presenting follow up for symptoms of anxiety.  The patient is currently taking Wellbutrin XL 1583m has increased to 30075mher previous dose before discontinuing)  for the management of her symptoms.  She has had a recent situational stressors.  Her mom was diagnosed with epithelial ovarian cancer recently underwent staging and now has started chemotherapy.  She herself is awaiting results of her BRCA testing.  She reports symptoms of anhedonia, insomnia, irritability, social anxiety, and feelings of guilt.  She denies risk taking behavior, agorophobia, feelings of worthlessness, suicidal ideation, homicidal ideation, auditory hallucinations, and visual hallucinations. Symptoms have improved since  initial onset .     The patient does have a pre-existing history of depression and anxiety.  She  does not a prior history of suicide attempts.  Previous treatment tied include Welbutrin  Review of Systems:  Review of Systems  Constitutional: Negative.   Gastrointestinal:  Negative for nausea.   Neurological:  Negative for headaches.  Psychiatric/Behavioral:  Negative for depression, hallucinations, memory loss, substance abuse and suicidal ideas. The patient is nervous/anxious. The patient does not have insomnia.     Past Medical History:  Past Medical History:  Diagnosis Date   Anxiety    Degenerative lumbar disc    Frequent headaches    Gestational thrombocytopenia (HCC)    Lumbar herniated disc    Migraine with aura, intractable, with status migrainosus 1995   Oligomenorrhea    Postpartum depression    with first pregnancy    Past Surgical History:  Past Surgical History:  Procedure Laterality Date   WISDOM TOOTH EXTRACTION  2012    Gynecologic History: No LMP recorded. (Menstrual status: IUD).  Obstetric History: [id number] History:  Family History  Problem Relation Age of Onset   Diabetes Mother    Diabetes Sister    Diabetes Maternal Grandmother    Heart disease Maternal Grandmother    Hypertension Maternal Grandmother    Kidney cancer Maternal Grandmother    Diabetes Maternal Grandfather    Heart disease Maternal Grandfather    Hypertension Maternal Grandfather    Diabetes Maternal Aunt    Breast cancer Neg Hx    Ovarian cancer Neg Hx    Colon cancer Neg Hx     Social History:  Social History   Socioeconomic History   Marital status: Married    Spouse name: JacEdison NasutiNumber of children: 2   Years of education: Not on file   Highest education level: Not on file  Occupational History   Occupation: RN Therapist, [employer] Use  Smoking status: Former    Packs/day: 0.25    Types: Cigarettes    Quit date: 02/26/2015    Years since quitting: 6.4   Smokeless tobacco: Never  Vaping Use   Vaping Use: Never used  Substance and Sexual Activity   Alcohol use: Not Currently    Alcohol/week: 1.0 standard drink    Types: 1 Glasses of wine per week   Drug use: No   Sexual activity: Yes    Partners: Male    Birth control/protection: I.U.D.  Other  Topics Concern   Not on file  Social History Narrative   Not on file   Social Determinants of Health   Financial Resource Strain: Not on file  Food Insecurity: Not on file  Transportation Needs: Not on file  Physical Activity: Not on file  Stress: Not on file  Social Connections: Not on file  Intimate Partner Violence: Not on file    Allergies:  No Known Allergies  Medications: Prior to Admission medications   Medication Sig Start Date End Date Taking? Authorizing Provider  SYNTHROID 25 MCG tablet Take one tab daily 12/18/18  Yes Dalia Heading, CNM  aspirin-acetaminophen-caffeine (EXCEDRIN MIGRAINE) [phone] MG tablet Take by mouth every 6 (six) hours as needed for headache.    [provider]  buPROPion (WELLBUTRIN XL) 150 MG 24 hr tablet Take 1 tablet (150 mg total) by mouth daily. 07/06/21   Malachy Mood, MD  gabapentin (NEURONTIN) 100 MG capsule TAKE 2 CAPSULES (200 MG TOTAL) BY MOUTH AT BEDTIME. 05/13/20 05/13/21  Lyndal Pulley, DO  levonorgestrel (MIRENA) 20 MCG/24HR IUD 1 each by Intrauterine route once.    [provider]  meloxicam (MOBIC) 15 MG tablet TAKE 1 TABLET (15 MG TOTAL) BY MOUTH DAILY. 09/16/20 09/16/21  Lyndal Pulley, DO  azelastine (ASTELIN) 0.1 % nasal spray Place 1 spray into both nostrils 2 (two) times daily. Use in each nostril as directed Patient taking differently: No sig reported 07/31/18 05/04/20  Darlin Priestly, PA-C  phentermine 37.5 MG capsule Take 1 capsule (37.5 mg total) by mouth every morning. 06/22/20 11/27/20  Diona Fanti, CNM    Physical Exam Vitals: There were no vitals filed for this visit. No LMP recorded. (Menstrual status: IUD).  No physical exam as this was a remote telephone visit to promote social distancing during the current COVID-19 Pandemic   GAD 7 : Generalized Anxiety Score 10/22/2020 05/26/2020 12/02/2019 11/04/2019  Nervous, Anxious, on Edge '1 1 1 3  ' Control/stop worrying 2 0 1 2   Worry too much - different things '2 1 1 2  ' Trouble relaxing 0 1 0 1  Restless 0 0 0 1  Easily annoyed or irritable 2 0 1 3  Afraid - awful might happen 1 0 1 3  Total GAD 7 Score '8 3 5 15  ' Anxiety Difficulty Somewhat difficult - Not difficult at all Somewhat difficult    Depression screen [facility] 2/9 03/04/2021 03/04/2021 11/27/2020  Decreased Interest 0 0 1  Down, Depressed, Hopeless 1 1 0  PHQ - 2 Score '1 1 1  ' Altered sleeping 1 1 0  Tired, decreased energy 0 0 0  Change in appetite 0 0 0  Feeling bad or failure about yourself  1 1 0  Trouble concentrating 0 0 0  Moving slowly or fidgety/restless 0 0 0  Suicidal thoughts 0 - 0  PHQ-9 Score '3 3 1  ' Difficult doing work/chores Somewhat difficult Somewhat difficult Not difficult at all  Some recent data might be hidden    Depression screen [facility] 2/9 03/04/2021 03/04/2021 11/27/2020 10/22/2020 05/26/2020  Decreased Interest 0 0 1 0 1  Down, Depressed, Hopeless 1 1 0 1 1  PHQ - 2 Score '1 1 1 1 2  ' Altered sleeping 1 1 0 0 1  Tired, decreased energy 0 0 0 0 0  Change in appetite 0 0 0 0 0  Feeling bad or failure about yourself  1 1 0 1 0  Trouble concentrating 0 0 0 0 0  Moving slowly or fidgety/restless 0 0 0 0 0  Suicidal thoughts 0 - 0 0 0  PHQ-9 Score '3 3 1 2 3  ' Difficult doing work/chores Somewhat difficult Somewhat difficult Not difficult at all Somewhat difficult Somewhat difficult  Some recent data might be hidden     Assessment: 30 y.o. [id number] follow up anxiety  Plan: Problem List Items Addressed This Visit   None Visit Diagnoses     Generalized anxiety disorder    -  Primary   Relevant Medications   traZODone (DESYREL) 50 MG tablet   buPROPion (WELLBUTRIN XL) 300 MG 24 hr tablet   hydrOXYzine (ATARAX/VISTARIL) 25 MG tablet       1) Anxiety - wellbutrin rx 37m - vistaril prn   2) Thyroid and B12 screen has not been obtained previously  3) Family history of ovarian cancer - MyRisk results pending  4)  Telephone Time 15:00 min  5) Return in about 3 weeks (around 08/12/2021) for medication follow up .    AMalachy Mood MD, FWoodvilleOB/GYN, CRiver RougeGroup 07/22/2021, 10:25 AM

## 2021-08-02 ENCOUNTER — Encounter: Payer: Self-pay | Admitting: Obstetrics and Gynecology

## 2021-08-02 DIAGNOSIS — Z8041 Family history of malignant neoplasm of ovary: Secondary | ICD-10-CM | POA: Insufficient documentation

## 2021-08-05 ENCOUNTER — Other Ambulatory Visit: Payer: Self-pay

## 2021-08-05 ENCOUNTER — Ambulatory Visit (INDEPENDENT_AMBULATORY_CARE_PROVIDER_SITE_OTHER): Payer: 59 | Admitting: Obstetrics and Gynecology

## 2021-08-05 DIAGNOSIS — F32A Depression, unspecified: Secondary | ICD-10-CM | POA: Diagnosis not present

## 2021-08-05 DIAGNOSIS — F419 Anxiety disorder, unspecified: Secondary | ICD-10-CM | POA: Diagnosis not present

## 2021-08-05 MED ORDER — BUSPIRONE HCL 7.5 MG PO TABS
7.5000 mg | ORAL_TABLET | Freq: Two times a day (BID) | ORAL | 2 refills | Status: DC
Start: 2021-08-05 — End: 2021-11-19
  Filled 2021-08-05: qty 60, 30d supply, fill #0

## 2021-08-05 NOTE — Progress Notes (Signed)
I connected with Jennifer Morse  on 08/05/21 at  8:30 AM EST by telephone and verified that I am speaking with the correct person using two identifiers.   I discussed the limitations, risks, security and privacy concerns of performing an evaluation and management service by telephone and the availability of in person appointments. I also discussed with the patient that there may be a patient responsible charge related to this service. The patient expressed understanding and agreed to proceed.  The patient was at home I spoke with the patient from my workstation phone The names of people involved in this encounter were: Jennifer Morse , and [facility] Visit   Chief Complaint:  Chief Complaint  Patient presents with   Follow-up    Phone visit - no concerns.    History of Present Illness: The patient is a 30 y.o. female presenting follow up for symptoms of anxiety and depression.  The patient is currently taking Wellbutrin XR 117m for the management of her symptoms.  She has had recent situational stressors, mom undergoing treatment for epithelial ovarian cancer (Patient's MyRisk testing came back negative).  She reports symptoms of overall good improvement in symptoms but still some continued baseline anxiety.  She denies agorophobia, suicidal ideation, homicidal ideation, auditory hallucinations, and visual hallucinations. Symptoms have improved since last visit.      Review of Systems: Review of Systems  Constitutional: Negative.   Genitourinary: Negative.   Psychiatric/Behavioral:  Negative for depression, hallucinations, substance abuse and suicidal ideas. The patient is nervous/anxious.     Past Medical History:  Past Medical History:  Diagnosis Date   Anxiety    BRCA negative 07/2021   MyRisk neg; IBIS=11.0%.riskscore=15.8%   Degenerative lumbar disc    Frequent headaches    Gestational thrombocytopenia (HCC)    Lumbar  herniated disc    Migraine with aura, intractable, with status migrainosus 1995   Oligomenorrhea    Postpartum depression    with first pregnancy    Past Surgical History:  Past Surgical History:  Procedure Laterality Date   WISDOM TOOTH EXTRACTION  2012    Gynecologic History: No LMP recorded. (Menstrual status: IUD).  Obstetric History: G2P2002  Family History:  Family History  Problem Relation Age of Onset   Diabetes Mother    Diabetes Sister    Diabetes Maternal Grandmother    Heart disease Maternal Grandmother    Hypertension Maternal Grandmother    Kidney cancer Maternal Grandmother    Diabetes Maternal Grandfather    Heart disease Maternal Grandfather    Hypertension Maternal Grandfather    Diabetes Maternal Aunt    Breast cancer Neg Hx    Ovarian cancer Neg Hx    Colon cancer Neg Hx     Social History:  Social History   Socioeconomic History   Marital status: Married    Spouse name: JEdison Nasuti  Number of children: 2   Years of education: Not on file   Highest education level: Not on file  Occupational History   Occupation: RN  Tobacco Use   Smoking status: Former    Packs/day: 0.25    Types: Cigarettes    Quit date: 02/26/2015    Years since quitting: 6.4   Smokeless tobacco: Never  Vaping Use   Vaping Use: Never used  Substance and Sexual Activity   Alcohol use: Not Currently    Alcohol/week: 1.0 standard drink    Types: 1 Glasses of wine  per week   Drug use: No   Sexual activity: Yes    Partners: Male    Birth control/protection: I.U.D.  Other Topics Concern   Not on file  Social History Narrative   Not on file   Social Determinants of Health   Financial Resource Strain: Not on file  Food Insecurity: Not on file  Transportation Needs: Not on file  Physical Activity: Not on file  Stress: Not on file  Social Connections: Not on file  Intimate Partner Violence: Not on file    Allergies:  No Known Allergies  Medications: Prior to  Admission medications   Medication Sig Start Date End Date Taking? Authorizing Provider  buPROPion (WELLBUTRIN XL) 300 MG 24 hr tablet Take 1 tablet (300 mg total) by mouth daily. 07/22/21  Yes Malachy Mood, MD  SYNTHROID 25 MCG tablet Take one tab daily 12/18/18  Yes Dalia Heading, CNM  traZODone (DESYREL) 50 MG tablet Take 1 tablet (50 mg total) by mouth at bedtime as needed for sleep. 07/22/21  Yes Malachy Mood, MD  aspirin-acetaminophen-caffeine (EXCEDRIN MIGRAINE) [phone] MG tablet Take by mouth every 6 (six) hours as needed for headache.    [provider]  gabapentin (NEURONTIN) 100 MG capsule TAKE 2 CAPSULES (200 MG TOTAL) BY MOUTH AT BEDTIME. 05/13/20 05/13/21  Lyndal Pulley, DO  hydrOXYzine (ATARAX/VISTARIL) 25 MG tablet Take 1 tablet (25 mg total) by mouth every 6 (six) hours as needed for anxiety. 07/22/21   Malachy Mood, MD  levonorgestrel (MIRENA) 20 MCG/24HR IUD 1 each by Intrauterine route once.    [provider]  meloxicam (MOBIC) 15 MG tablet TAKE 1 TABLET (15 MG TOTAL) BY MOUTH DAILY. 09/16/20 09/16/21  Lyndal Pulley, DO  azelastine (ASTELIN) 0.1 % nasal spray Place 1 spray into both nostrils 2 (two) times daily. Use in each nostril as directed Patient taking differently: No sig reported 07/31/18 05/04/20  Darlin Priestly, PA-C  phentermine 37.5 MG capsule Take 1 capsule (37.5 mg total) by mouth every morning. 06/22/20 11/27/20  Diona Fanti, CNM    Physical Exam Vitals: There were no vitals filed for this visit. No LMP recorded. (Menstrual status: IUD).  No physical exam as this was a remote telephone visit to promote social distancing during the current COVID-19 Pandemic   GAD 7 : Generalized Anxiety Score 08/05/2021 10/22/2020 05/26/2020 12/02/2019  Nervous, Anxious, on Edge _0 Control/stop worrying 1 2 0 1  Worry too much - different things _1 Trouble relaxing 0 0 1 0  Restless 0 0 0 0  Easily annoyed or  irritable 1 2 0 1  Afraid - awful might happen 1 1 0 1  Total GAD 7 Score _2 Anxiety Difficulty Somewhat difficult Somewhat difficult - Not difficult at all    Depression screen [facility] 2/9 08/05/2021 03/04/2021 03/04/2021  Decreased Interest 1 0 0  Down, Depressed, Hopeless _3 PHQ - 2 Score _4 Altered sleeping 0 1 1  Tired, decreased energy 1 0 0  Change in appetite 1 0 0  Feeling bad or failure about yourself  _5 Trouble concentrating 0 0 0  Moving slowly or fidgety/restless 0 0 0  Suicidal thoughts 0 0 -  PHQ-9 Score _6 Difficult doing work/chores Somewhat difficult Somewhat difficult Somewhat difficult  Some recent data might be hidden    Depression screen [facility] 2/9 08/05/2021  03/04/2021 03/04/2021 11/27/2020 10/22/2020  Decreased Interest 1 0 0 1 0  Down, Depressed, Hopeless _0 0 1  PHQ - 2 Score _1 Altered sleeping 0 1 1 0 0  Tired, decreased energy 1 0 0 0 0  Change in appetite 1 0 0 0 0  Feeling bad or failure about yourself  _2 0 1  Trouble concentrating 0 0 0 0 0  Moving slowly or fidgety/restless 0 0 0 0 0  Suicidal thoughts 0 0 - 0 0  PHQ-9 Score _3 Difficult doing work/chores Somewhat difficult Somewhat difficult Somewhat difficult Not difficult at all Somewhat difficult  Some recent data might be hidden     Assessment: 30 y.o. [id number] medication follow up anxiety/depression  Plan: Problem List Items Addressed This Visit   None Visit Diagnoses     Anxiety and depression    -  Primary   Relevant Medications   busPIRone (BUSPAR) 7.5 MG tablet       1) Anxiety/Depression - good improvement on scale but still has some anxiety - will add Buspar 7.29m bid in addition to Wellbutrin XL 301m 2) Thyroid and B12 screen has not been obtained previously  3) Telephone Time: 8:1524m 4) Return in about 4 weeks (around 09/02/2021) for ROB.    AndMalachy MoodD, FACLoura Pardon/GYN, Con[facility] 08/05/2021, 8:51 AM

## 2021-08-11 ENCOUNTER — Ambulatory Visit: Payer: 59 | Admitting: Obstetrics and Gynecology

## 2021-08-13 ENCOUNTER — Ambulatory Visit: Payer: 59 | Admitting: Nurse Practitioner

## 2021-08-13 ENCOUNTER — Other Ambulatory Visit: Payer: Self-pay

## 2021-08-13 ENCOUNTER — Encounter: Payer: Self-pay | Admitting: Nurse Practitioner

## 2021-08-13 VITALS — BP 98/66 | HR 60 | Temp 98.2°F | Ht 66.0 in | Wt 196.8 lb

## 2021-08-13 DIAGNOSIS — Z975 Presence of (intrauterine) contraceptive device: Secondary | ICD-10-CM | POA: Diagnosis not present

## 2021-08-13 DIAGNOSIS — E039 Hypothyroidism, unspecified: Secondary | ICD-10-CM

## 2021-08-13 DIAGNOSIS — Z136 Encounter for screening for cardiovascular disorders: Secondary | ICD-10-CM

## 2021-08-13 DIAGNOSIS — F419 Anxiety disorder, unspecified: Secondary | ICD-10-CM

## 2021-08-13 DIAGNOSIS — Z1159 Encounter for screening for other viral diseases: Secondary | ICD-10-CM

## 2021-08-13 DIAGNOSIS — F32A Depression, unspecified: Secondary | ICD-10-CM

## 2021-08-13 DIAGNOSIS — G43009 Migraine without aura, not intractable, without status migrainosus: Secondary | ICD-10-CM | POA: Diagnosis not present

## 2021-08-13 DIAGNOSIS — M5416 Radiculopathy, lumbar region: Secondary | ICD-10-CM | POA: Diagnosis not present

## 2021-08-13 DIAGNOSIS — Z87442 Personal history of urinary calculi: Secondary | ICD-10-CM

## 2021-08-13 DIAGNOSIS — E6609 Other obesity due to excess calories: Secondary | ICD-10-CM

## 2021-08-13 DIAGNOSIS — Z8041 Family history of malignant neoplasm of ovary: Secondary | ICD-10-CM

## 2021-08-13 DIAGNOSIS — Z13220 Encounter for screening for lipoid disorders: Secondary | ICD-10-CM | POA: Diagnosis not present

## 2021-08-13 DIAGNOSIS — Z6831 Body mass index (BMI) 31.0-31.9, adult: Secondary | ICD-10-CM

## 2021-08-13 DIAGNOSIS — Z833 Family history of diabetes mellitus: Secondary | ICD-10-CM

## 2021-08-13 DIAGNOSIS — Z7689 Persons encountering health services in other specified circumstances: Secondary | ICD-10-CM

## 2021-08-13 NOTE — Patient Instructions (Signed)

## 2021-08-13 NOTE — Assessment & Plan Note (Signed)
BMI 31.76.  She is going to discuss restarting Phentermine with her GYN.  Could also consider Ozempic or Wegovy in future, check A1c and labs.  Recommended eating smaller high protein, low fat meals more frequently and exercising 30 mins a day 5 times a week with a goal of 10-15lb weight loss in the next 3 months. Patient voiced their understanding and motivation to adhere to these recommendations.

## 2021-08-13 NOTE — Assessment & Plan Note (Signed)
Chronic, stable without medication.  Recheck levels today and place new referral to endo, as patient would like to visit with another endo provider for second opinion.

## 2021-08-13 NOTE — Assessment & Plan Note (Signed)
Chronic, stable with PRN Gabapentin.  Continue collaboration with ortho as needed. History of epidurals.

## 2021-08-13 NOTE — Assessment & Plan Note (Signed)
Chronic, stable without SI/HI.  Currently her mother has ovarian cancer.  Will maintain Wellbutrin and Buspar + Vistaril.  Trazodone as needed for sleep.  Monitor mood closely and adjust regimen as needed.

## 2021-08-13 NOTE — Assessment & Plan Note (Signed)
Last episode one year ago during Covid, monitor closely for recurrence.

## 2021-08-13 NOTE — Assessment & Plan Note (Signed)
Chronic, ongoing.  Poorly tolerated Propranolol in past due to at baseline lower resting HR.  Did not tolerate Topamax. Have recommended she trial Gabapentin every night vs PRN, which she takes for degenerative disc disease.  Educated on this and migraine prevention.  Continue Excedrin as needed.  May trial Effexor in future, which would offer benefit for mood and migraines, however weight gain is a concern for patient.  Return in 3 months.

## 2021-08-13 NOTE — Assessment & Plan Note (Signed)
Continue collaboration with GYN.

## 2021-08-13 NOTE — Progress Notes (Signed)
New Patient Office Visit  Subjective:  Patient ID: Jennifer Morse, female    DOB: 11-05-1990  Age: 30 y.o. MRN: [id number]  CC:  Chief Complaint  Patient presents with   Establish Care    Patient is here to establish care. Patient states she stopped going to her Endocrinologist because he was a "weirdo" and she stopped her prescription of Synthroid. Patient states she has not taken it in about 2-3 months. Patient states she is not sure if she needs the medication anymore. Patient states she would like to discuss a new Endocrinologist.     HPI Jennifer Morse presents for new patient visit to establish care.  Introduced to Designer, jewellery role and practice setting.  All questions answered.  Discussed provider/patient relationship and expectations.  Was followed by Dr. Georgianne Fick.  Has chronic lower back pain for degenerative disc disease -- takes Meloxicam and Gabapentin + Tizanidine as needed.  Has had epidural injections, last one was at beginning of year -- Dr. Tamala Julian.  These are overall beneficial for her.  History of kidney stones, last had one year ago -- same day she tested for Covid -- had two she passed.  Mother has had kidney stones.    MIGRAINES Currently takes Excedrin only.  Typically gets headache 2-3 times a week.  Has tried Propranolol in past, which lowered heart rate more then it is already is (HR 38) + Topamax in past -- still got headaches. Took Imitrex and Maxalt as needed.   Duration: chronic Onset: gradual Severity: 10/10 Quality: dull, aching, and throbbing Frequency: intermittent Location: temples all the way back into neck Headache duration: Radiation: no Time of day headache occurs: varies Alleviating factors: Excedrin Aggravating factors: rainy days Headache status at time of visit: asymptomatic Treatments attempted: as above   Aura: no Nausea:  no Vomiting: no Photophobia:  yes Phonophobia:  yes Effect on social functioning:  yes Numbers  of missed days of school/work each month:  Confusion:  no Gait disturbance/ataxia:  no Behavioral changes:  no Fevers:  no   HYPOTHYROIDISM After having son in 2018 she was diagnosed with nodules on thyroid, saw endocrinology -- they did ultrasound in his office and wanted to do biopsy -- attempted but did not obtain. Dr. Ronnald Collum, last saw long while. She took herself off Levothyroxine 2-3 months ago. Last TSH 1.050 in March 2022.  On review of ultrasound there were no nodules that met criteria for biopsies.   Thyroid control status:stable Fatigue: yes Cold intolerance: no Heat intolerance: no Weight gain: yes due to depressed mood -- is planning on restarting Phentermine with GYN Weight loss: no Constipation: yes Diarrhea/loose stools: no Palpitations: no Lower extremity edema: no Anxiety/depressed mood: yes   ANXIETY/STRESS Initially treated with medications after birth of her son, after incident of him stopping breathing when she was home alone with him.   Back in summer took herself off mental health medication, but restarted after her mom's recent diagnosis. Her mom was diagnosed with Stage 3 ovarian cancer in September.  Patient had genetic testing done which was negative.  Currently taking Wellbutrin, Buspar == PRN Trazodone and Vistaril.   Duration:stable Anxious mood: yes  Excessive worrying: yes Irritability: no  Sweating: no Nausea: no Palpitations:no Hyperventilation: no Panic attacks:  on Saturday when her mother's hair started falling out Agoraphobia: no  Obscessions/compulsions: no Depressed mood: yes Depression screen [facility] 2/9 08/13/2021 08/05/2021 03/04/2021 03/04/2021 11/27/2020  Decreased Interest 1 1 0 0 1  Down,  Depressed, Hopeless _0 0  PHQ - 2 Score _1 Altered sleeping 1 0 1 1 0  Tired, decreased energy 1 1 0 0 0  Change in appetite 1 1 0 0 0  Feeling bad or failure about yourself  _2 0  Trouble concentrating 0 0 0 0 0  Moving slowly  or fidgety/restless 0 0 0 0 0  Suicidal thoughts 0 0 0 - 0  PHQ-9 Score _3 Difficult doing work/chores Somewhat difficult Somewhat difficult Somewhat difficult Somewhat difficult Not difficult at all  Some recent data might be hidden  Anhedonia: no Weight changes: yes Insomnia: yes hard to fall asleep  Hypersomnia: no Fatigue/loss of energy: yes Feelings of worthlessness: no Feelings of guilt: no Impaired concentration/indecisiveness: no Suicidal ideations: no  Crying spells: yes Recent Stressors/Life Changes: yes   Relationship problems: no   Family stress: yes     Financial stress: no    Job stress: no    Recent death/loss: no  GAD 7 : Generalized Anxiety Score 08/13/2021 08/05/2021 10/22/2020 05/26/2020  Nervous, Anxious, on Edge _4 Control/stop worrying _5 0  Worry too much - different things _6 Trouble relaxing 1 0 0 1  Restless 0 0 0 0  Easily annoyed or irritable _7 0  Afraid - awful might happen _8 0  Total GAD 7 Score _9 Anxiety Difficulty Somewhat difficult Somewhat difficult Somewhat difficult -     Past Medical History:  Diagnosis Date   Anxiety    BRCA negative 07/2021   MyRisk neg; IBIS=11.0%.riskscore=15.8%   Degenerative lumbar disc    Frequent headaches    Gestational thrombocytopenia (HCC)    Lumbar herniated disc    Migraine with aura, intractable, with status migrainosus 1995   Oligomenorrhea    Postpartum depression    with first pregnancy    Past Surgical History:  Procedure Laterality Date   WISDOM TOOTH EXTRACTION  2012    Family History  Problem Relation Age of Onset   Diabetes Mother    Diabetes Sister    Diabetes Maternal Grandmother    Heart disease Maternal Grandmother    Hypertension Maternal Grandmother    Kidney cancer Maternal Grandmother    Diabetes Maternal Grandfather    Heart disease Maternal Grandfather    Hypertension Maternal Grandfather    Diabetes Maternal Aunt    Breast cancer  Neg Hx    Ovarian cancer Neg Hx    Colon cancer Neg Hx     Social History   Socioeconomic History   Marital status: Married    Spouse name: Edison Nasuti   Number of children: 2   Years of education: Not on file   Highest education level: Not on file  Occupational History   Occupation: RN  Tobacco Use   Smoking status: Former    Packs/day: 0.25    Types: Cigarettes    Quit date: 02/26/2015    Years since quitting: 6.4   Smokeless tobacco: Never  Vaping Use   Vaping Use: Never used  Substance and Sexual Activity   Alcohol use: Not Currently    Alcohol/week: 1.0 standard drink    Types: 1 Glasses of wine per week   Drug use: No   Sexual activity: Yes    Partners: Male    Birth control/protection: I.U.D.  Other  Topics Concern   Not on file  Social History Narrative   Not on file   Social Determinants of Health   Financial Resource Strain: Low Risk    Difficulty of Paying Living Expenses: Not hard at all  Food Insecurity: No Food Insecurity   Worried About Charity fundraiser in the Last Year: Never true   [place] in the Last Year: Never true  Transportation Needs: No Transportation Needs   Lack of Transportation (Medical): No   Lack of Transportation (Non-Medical): No  Physical Activity: Insufficiently Active   Days of Exercise per Week: 3 days   Minutes of Exercise per Session: 30 min  Stress: No Stress Concern Present   Feeling of Stress : Only a little  Social Connections: Moderately Isolated   Frequency of Communication with Friends and Family: More than three times a week   Frequency of Social Gatherings with Friends and Family: More than three times a week   Attends Religious Services: Never   Marine scientist or Organizations: No   Attends Music therapist: Never   Marital Status: Married  Human resources officer Violence: Not At Risk   Fear of Current or Ex-Partner: No   Emotionally Abused: No   Physically Abused: No   Sexually Abused: No     ROS Review of Systems  Constitutional:  Negative for activity change, appetite change, diaphoresis, fatigue and fever.  Respiratory:  Negative for cough, chest tightness and shortness of breath.   Cardiovascular:  Negative for chest pain, palpitations and leg swelling.  Gastrointestinal: Negative.   Endocrine: Negative.   Neurological: Negative.   Psychiatric/Behavioral: Negative.     Objective:   Today's Vitals: BP 98/66   Pulse 60   Temp 98.2 F (36.8 C) (Oral)   Ht _0  (1.676 m)   Wt 196 lb 12.8 oz (89.3 kg)   SpO2 98%   BMI 31.76 kg/m   Physical Exam Vitals and nursing note reviewed.  Constitutional:      General: She is awake. She is not in acute distress.    Appearance: She is well-developed and well-groomed. She is obese. She is not ill-appearing or toxic-appearing.  HENT:     Head: Normocephalic.     Right Ear: Hearing normal.     Left Ear: Hearing normal.  Eyes:     General: Lids are normal.        Right eye: No discharge.        Left eye: No discharge.     Conjunctiva/sclera: Conjunctivae normal.     Pupils: Pupils are equal, round, and reactive to light.  Neck:     Thyroid: No thyromegaly.     Vascular: No carotid bruit or JVD.  Cardiovascular:     Rate and Rhythm: Normal rate and regular rhythm.     Heart sounds: Normal heart sounds. No murmur heard.   No gallop.  Pulmonary:     Effort: Pulmonary effort is normal. No accessory muscle usage or respiratory distress.     Breath sounds: Normal breath sounds.  Abdominal:     General: Bowel sounds are normal.     Palpations: Abdomen is soft. There is no hepatomegaly or splenomegaly.  Musculoskeletal:     Cervical back: Normal range of motion and neck supple.     Right lower leg: No edema.     Left lower leg: No edema.  Skin:    General: Skin is warm and dry.  Neurological:  Mental Status: She is alert and oriented to person, place, and time.     Deep Tendon Reflexes: Reflexes are normal and  symmetric.     Reflex Scores:      Brachioradialis reflexes are 2+ on the right side and 2+ on the left side.      Patellar reflexes are 2+ on the right side and 2+ on the left side. Psychiatric:        Attention and Perception: Attention normal.        Mood and Affect: Mood normal.        Speech: Speech normal.        Behavior: Behavior normal. Behavior is cooperative.        Thought Content: Thought content normal.    Assessment & Plan:   Problem List Items Addressed This Visit       Cardiovascular and Mediastinum   Migraine    Chronic, ongoing.  Poorly tolerated Propranolol in past due to at baseline lower resting HR.  Did not tolerate Topamax. Have recommended she trial Gabapentin every night vs PRN, which she takes for degenerative disc disease.  Educated on this and migraine prevention.  Continue Excedrin as needed.  May trial Effexor in future, which would offer benefit for mood and migraines, however weight gain is a concern for patient.  Return in 3 months.      Relevant Medications   tiZANidine-Liniment (TIZANIDINE COMFORT PAC) 4 MG MISC   Other Relevant Orders   CBC with Differential/Platelet   Comprehensive metabolic panel     Endocrine   Hypothyroidism - Primary    Chronic, stable without medication.  Recheck levels today and place new referral to endo, as patient would like to visit with another endo provider for second opinion.        Relevant Orders   TSH   T4, free   Thyroid peroxidase antibody   Ambulatory referral to Endocrinology     Nervous and Auditory   Lumbar radiculopathy    Chronic, stable with PRN Gabapentin.  Continue collaboration with ortho as needed. History of epidurals.      Relevant Medications   tiZANidine-Liniment (TIZANIDINE COMFORT PAC) 4 MG MISC     Other   Anxiety and depression    Chronic, stable without SI/HI.  Currently her mother has ovarian cancer.  Will maintain Wellbutrin and Buspar + Vistaril.  Trazodone as needed for  sleep.  Monitor mood closely and adjust regimen as needed.        Family history of ovarian cancer    In mother, she has gone through genetic testing which was negative.        History of kidney stones    Last episode one year ago during Covid, monitor closely for recurrence.      IUD (intrauterine device) in place    Continue collaboration with GYN.      Obesity    BMI 31.76.  She is going to discuss restarting Phentermine with her GYN.  Could also consider Ozempic or Wegovy in future, check A1c and labs.  Recommended eating smaller high protein, low fat meals more frequently and exercising 30 mins a day 5 times a week with a goal of 10-15lb weight loss in the next 3 months. Patient voiced their understanding and motivation to adhere to these recommendations.       Other Visit Diagnoses     Family history of diabetes mellitus       Relevant Orders   HgB  A1c   Need for hepatitis C screening test       Hep C screening on labs today, discussed with patient.   Relevant Orders   Hepatitis C antibody   Encounter for lipid screening for cardiovascular disease       Lipid panel today -- is fasting.   Relevant Orders   Lipid Panel w/o Chol/HDL Ratio   Encounter to establish care       Establish care today.       Outpatient Encounter Medications as of 08/13/2021  Medication Sig   aspirin-acetaminophen-caffeine (EXCEDRIN MIGRAINE) 250-250-65 MG tablet Take by mouth every 6 (six) hours as needed for headache.   buPROPion (WELLBUTRIN XL) 300 MG 24 hr tablet Take 1 tablet (300 mg total) by mouth daily.   busPIRone (BUSPAR) 7.5 MG tablet Take 1 tablet (7.5 mg total) by mouth 2 (two) times daily.   gabapentin (NEURONTIN) 100 MG capsule TAKE 2 CAPSULES (200 MG TOTAL) BY MOUTH AT BEDTIME.   hydrOXYzine (ATARAX/VISTARIL) 25 MG tablet Take 1 tablet (25 mg total) by mouth every 6 (six) hours as needed for anxiety.   levonorgestrel (MIRENA) 20 MCG/24HR IUD 1 each by Intrauterine route once.    meloxicam (MOBIC) 15 MG tablet TAKE 1 TABLET (15 MG TOTAL) BY MOUTH DAILY.   tiZANidine-Liniment (TIZANIDINE COMFORT PAC) 4 MG MISC    traZODone (DESYREL) 50 MG tablet Take 1 tablet (50 mg total) by mouth at bedtime as needed for sleep.   SYNTHROID 25 MCG tablet Take one tab daily (Patient not taking: Reported on 08/13/2021)   [DISCONTINUED] azelastine (ASTELIN) 0.1 % nasal spray Place 1 spray into both nostrils 2 (two) times daily. Use in each nostril as directed (Patient taking differently: No sig reported)   [DISCONTINUED] phentermine 37.5 MG capsule Take 1 capsule (37.5 mg total) by mouth every morning.   No facility-administered encounter medications on file as of 08/13/2021.    Follow-up: Return in about 3 months (around 11/11/2021) for Migraines and Mood.   Venita Lick, NP

## 2021-08-13 NOTE — Assessment & Plan Note (Signed)
In mother, she has gone through genetic testing which was negative.

## 2021-08-14 LAB — CBC WITH DIFFERENTIAL/PLATELET
Basophils Absolute: 0 10*3/uL (ref 0.0–0.2)
Basos: 1 %
EOS (ABSOLUTE): 0.2 10*3/uL (ref 0.0–0.4)
Eos: 4 %
Hematocrit: 42 % (ref 34.0–46.6)
Hemoglobin: 14.1 g/dL (ref 11.1–15.9)
Immature Grans (Abs): 0 10*3/uL (ref 0.0–0.1)
Immature Granulocytes: 0 %
Lymphocytes Absolute: 1.7 10*3/uL (ref 0.7–3.1)
Lymphs: 38 %
MCH: 29.7 pg (ref 26.6–33.0)
MCHC: 33.6 g/dL (ref 31.5–35.7)
MCV: 89 fL (ref 79–97)
Monocytes Absolute: 0.4 10*3/uL (ref 0.1–0.9)
Monocytes: 8 %
Neutrophils Absolute: 2.3 10*3/uL (ref 1.4–7.0)
Neutrophils: 49 %
Platelets: 153 10*3/uL (ref 150–450)
RBC: 4.74 x10E6/uL (ref 3.77–5.28)
RDW: 12.2 % (ref 11.7–15.4)
WBC: 4.6 10*3/uL (ref 3.4–10.8)

## 2021-08-14 LAB — HEPATITIS C ANTIBODY: Hep C Virus Ab: <0.1 s/co ratio (ref 0.0–0.9)

## 2021-08-14 LAB — LIPID PANEL W/O CHOL/HDL RATIO
Cholesterol, Total: 167 mg/dL (ref 100–199)
HDL: 62 mg/dL (ref >39)
LDL Chol Calc (NIH): 96 mg/dL (ref 0–99)
Triglycerides: 45 mg/dL (ref 0–149)
VLDL Cholesterol Cal: 9 mg/dL (ref 5–40)

## 2021-08-14 LAB — COMPREHENSIVE METABOLIC PANEL
ALT: 13 IU/L (ref 0–32)
AST: 14 IU/L (ref 0–40)
Albumin/Globulin Ratio: 1.8 (ref 1.2–2.2)
Albumin: 4.6 g/dL (ref 3.8–4.8)
Alkaline Phosphatase: 69 IU/L (ref 44–121)
BUN/Creatinine Ratio: 17 (ref 9–23)
BUN: 15 mg/dL (ref 6–20)
Bilirubin Total: 0.2 mg/dL (ref 0.0–1.2)
CO2: 22 mmol/L (ref 20–29)
Calcium: 9.6 mg/dL (ref 8.7–10.2)
Chloride: 104 mmol/L (ref 96–106)
Creatinine, Ser: 0.88 mg/dL (ref 0.57–1.00)
Globulin, Total: 2.5 g/dL (ref 1.5–4.5)
Glucose: 79 mg/dL (ref 70–99)
Potassium: 4.3 mmol/L (ref 3.5–5.2)
Sodium: 142 mmol/L (ref 134–144)
Total Protein: 7.1 g/dL (ref 6.0–8.5)
eGFR: 89 mL/min/{1.73_m2} (ref >59)

## 2021-08-14 LAB — THYROID PEROXIDASE ANTIBODY: Thyroperoxidase Ab SerPl-aCnc: <9 IU/mL (ref 0–34)

## 2021-08-14 LAB — HEMOGLOBIN A1C
Est. average glucose Bld gHb Est-mCnc: 111 mg/dL
Hgb A1c MFr Bld: 5.5 % (ref 4.8–5.6)

## 2021-08-14 LAB — TSH: TSH: 1.91 u[IU]/mL (ref 0.450–4.500)

## 2021-08-14 LAB — T4, FREE: Free T4: 1.5 ng/dL (ref 0.82–1.77)

## 2021-08-15 NOTE — Progress Notes (Signed)
Contacted via MyChart   Good evening Jennifer Morse, your labs have returned.  Overall they look fantastic, including thyroid labs.  A1c shows no diabetes or prediabetes, for weight I would look into insurance and see if they will cover Wegovy or Ozempic -- what out of pocket cost may be.  One thing we could consider before you return to endocrinology is repeating your thyroid ultrasound.  If this is normal without nodules, then we could monitor thyroid in office regularly vs having you return to endocrinology.  What are your thoughts on this?  Let me know.  Have a great evening!! Keep being awesome!!  Thank you for allowing me to participate in your care.  I appreciate you. Kindest regards, Caresse Sedivy

## 2021-08-17 ENCOUNTER — Encounter: Payer: Self-pay | Admitting: Nurse Practitioner

## 2021-08-17 ENCOUNTER — Other Ambulatory Visit: Payer: Self-pay

## 2021-08-17 DIAGNOSIS — E039 Hypothyroidism, unspecified: Secondary | ICD-10-CM

## 2021-08-17 MED ORDER — WEGOVY 0.25 MG/0.5ML ~~LOC~~ SOAJ
SUBCUTANEOUS | 4 refills | Status: DC
  Filled 2021-08-17: qty 4.5, fill #0
  Filled 2021-08-26 – 2021-08-27 (×2): qty 2, 28d supply, fill #0
  Filled 2021-09-21: qty 2, 28d supply, fill #1

## 2021-08-26 ENCOUNTER — Encounter: Payer: Self-pay | Admitting: Nurse Practitioner

## 2021-08-26 ENCOUNTER — Other Ambulatory Visit: Payer: Self-pay

## 2021-08-26 ENCOUNTER — Ambulatory Visit
Admission: RE | Admit: 2021-08-26 | Discharge: 2021-08-26 | Disposition: A | Discharge status: Home / Self Care | Payer: 59 | Source: Ambulatory Visit | Attending: Nurse Practitioner | Admitting: Nurse Practitioner

## 2021-08-26 DIAGNOSIS — E039 Hypothyroidism, unspecified: Secondary | ICD-10-CM | POA: Diagnosis not present

## 2021-08-26 DIAGNOSIS — E041 Nontoxic single thyroid nodule: Secondary | ICD-10-CM

## 2021-08-26 NOTE — Progress Notes (Signed)
Contacted via MyChart   Good evening Jennifer Morse, your thyroid imaging has returned and compared to the 2018 one in chart there is indeed a nodule that meets criteria for biopsy.  I would recommend for this we send you to local ENT who will perform this.  I also have the referral into [facility] endocrinology, has you heard from them?  Let me know and let me know if you would like to see ENT for a biopsy of area?  Keep being amazing!!  Thank you for allowing me to participate in your care.  I appreciate you. Kindest regards, Reighan Hipolito

## 2021-08-27 ENCOUNTER — Other Ambulatory Visit: Payer: Self-pay

## 2021-08-31 ENCOUNTER — Other Ambulatory Visit: Payer: Self-pay

## 2021-09-07 ENCOUNTER — Ambulatory Visit: Payer: 59 | Admitting: Nurse Practitioner

## 2021-09-08 ENCOUNTER — Ambulatory Visit: Payer: 59 | Admitting: Obstetrics and Gynecology

## 2021-09-08 IMAGING — CR DG SACRUM/COCCYX 2+V
3 series · 3 of 3 positions shown · non-contrast
Comparison: None.

CLINICAL DATA: 28-year-old female with acute sacrum/coccyx pain. No
known injury.

EXAM:
SACRUM AND COCCYX - 2+ VIEW

[sacrum ap]
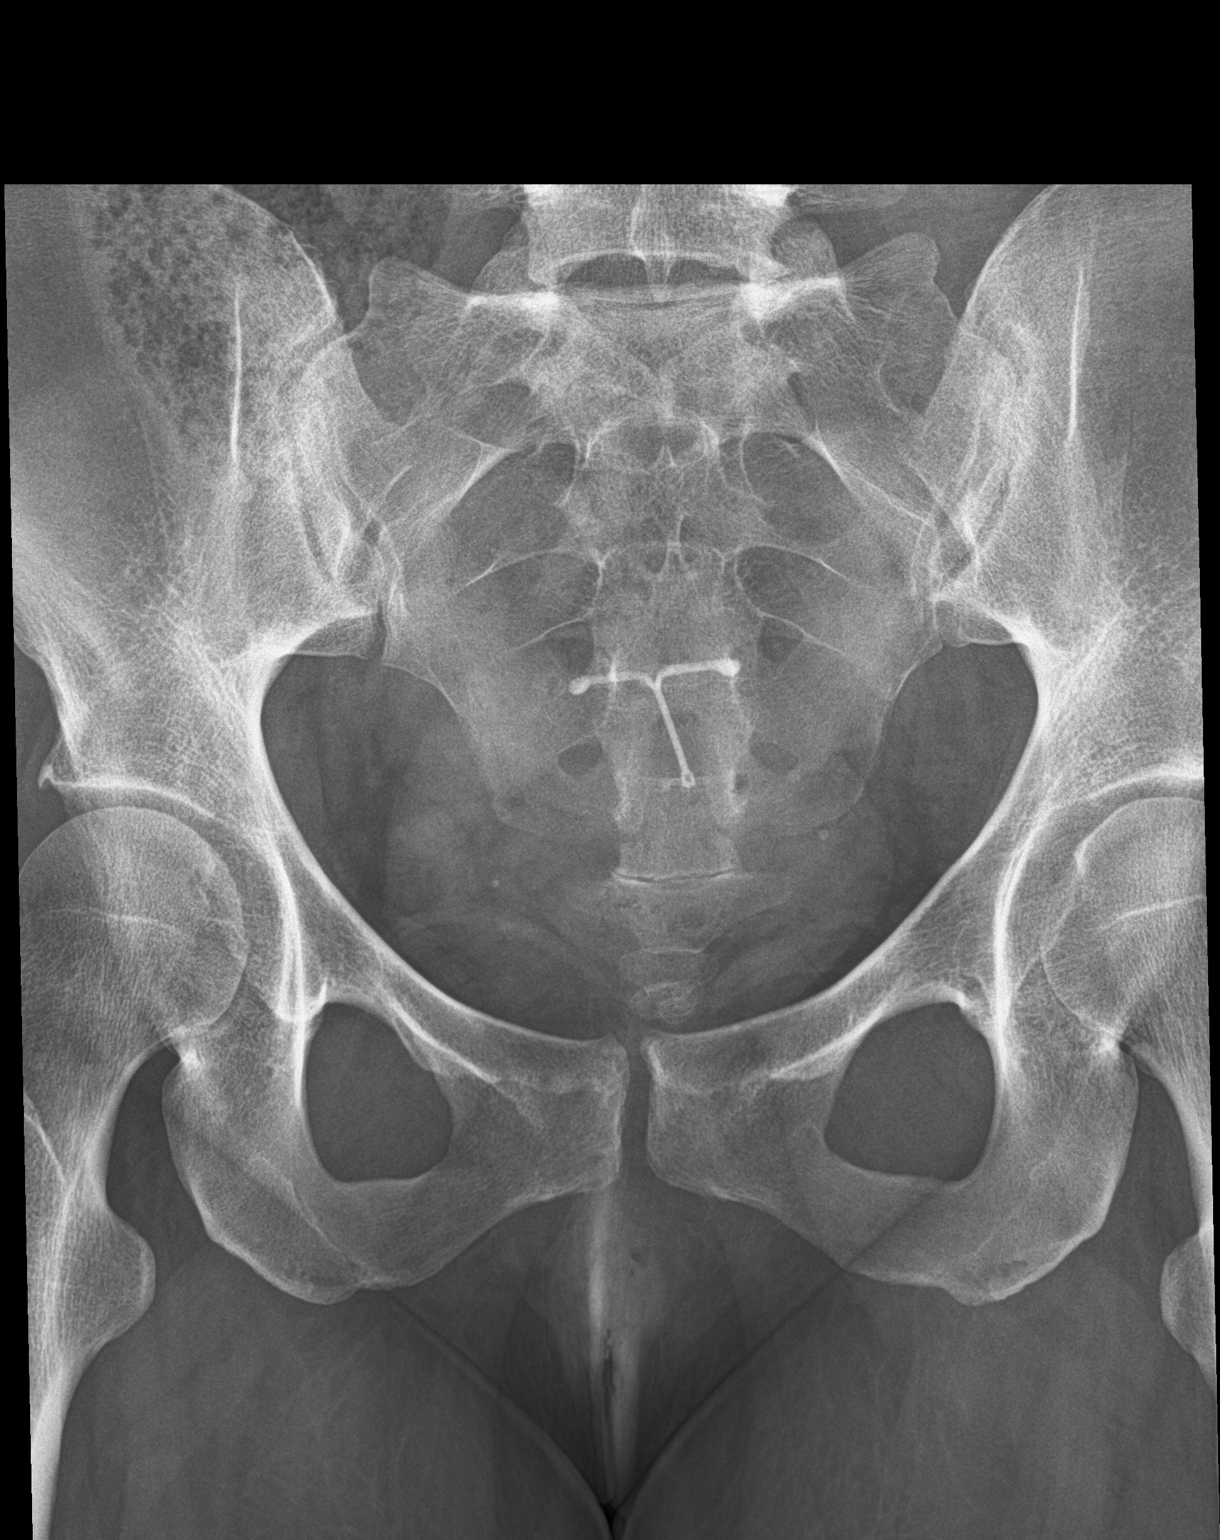

[coccyx ap]
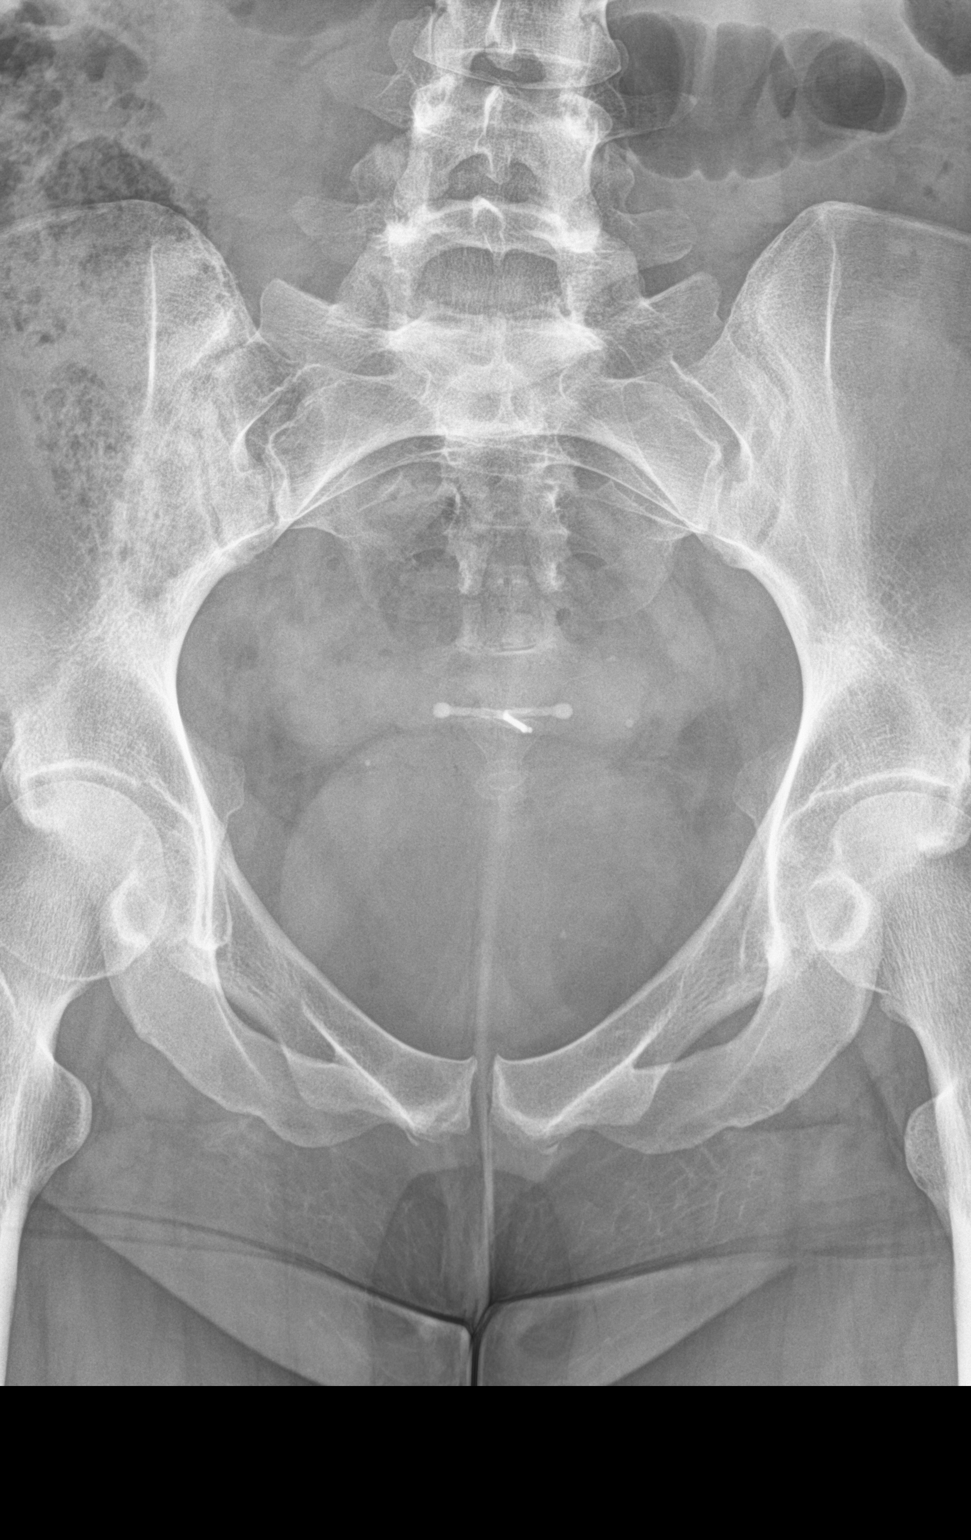

[sacrum lat]
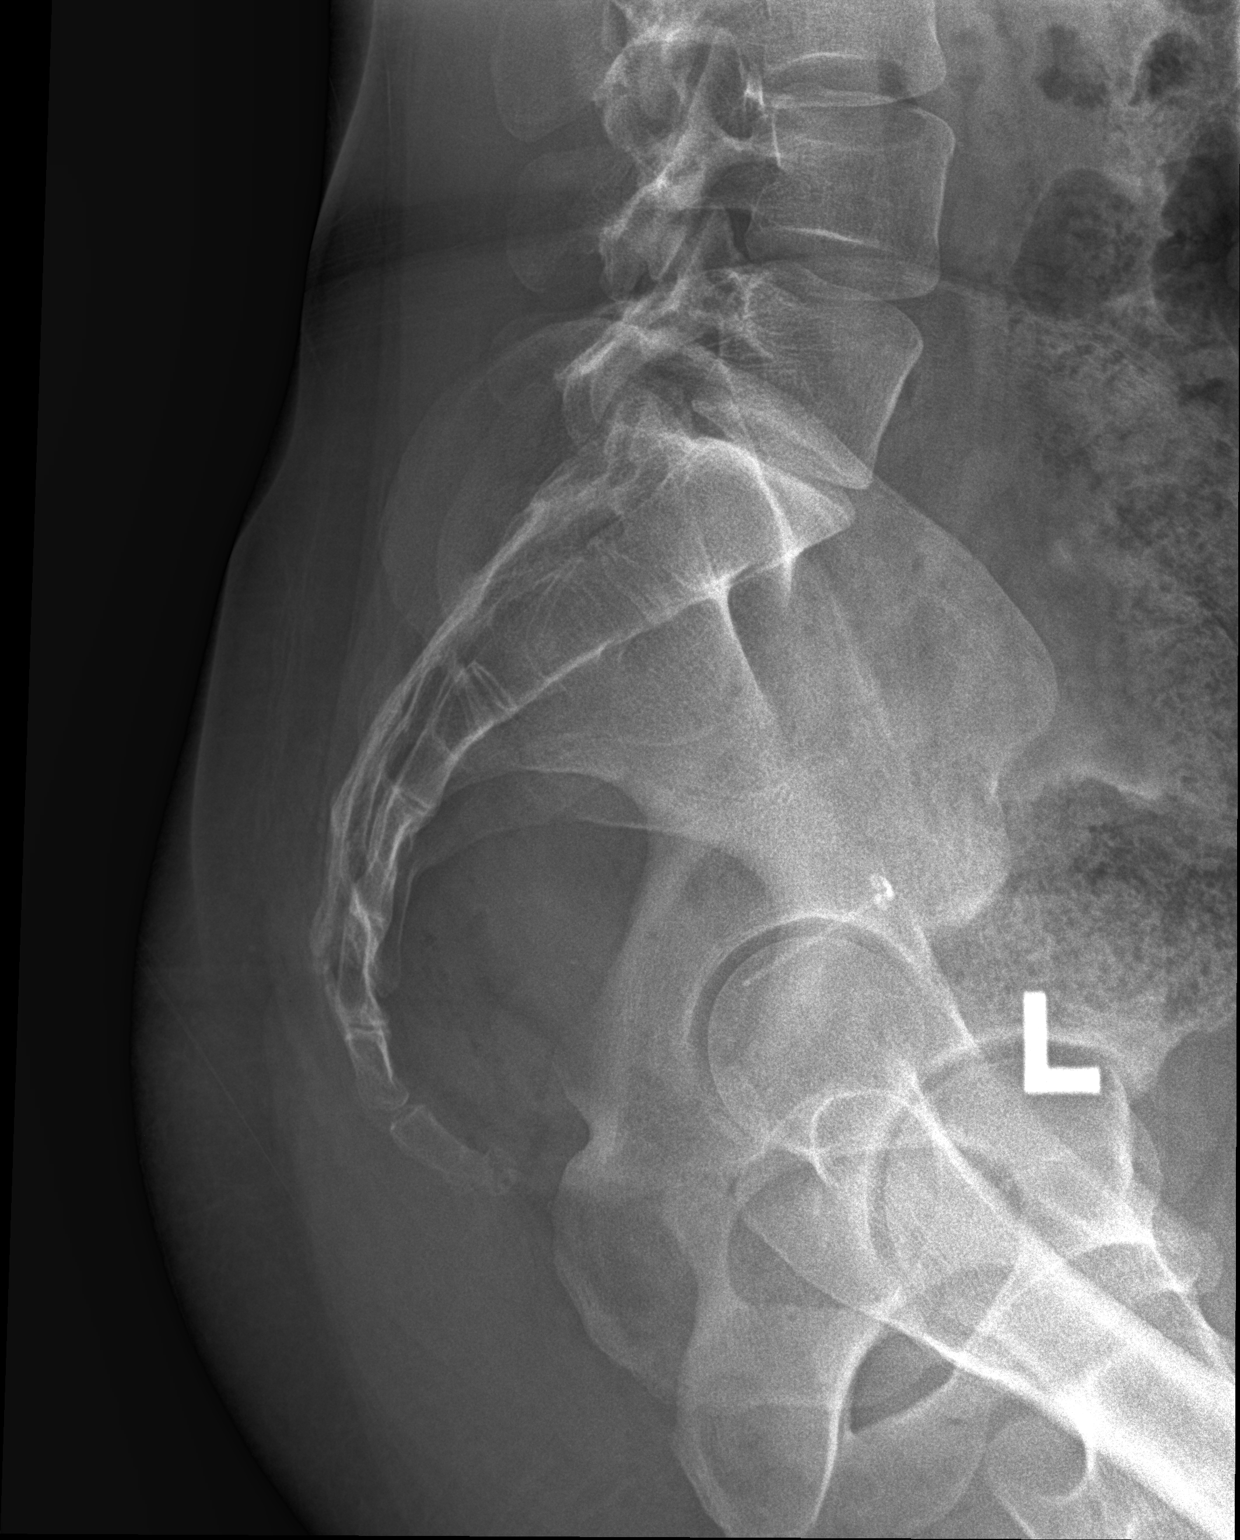

[3 of 3 positions shown; findings below may reference images not displayed]

FINDINGS: There is no evidence of fracture or other focal bone lesions. An IUD
is noted.
IMPRESSION: Negative.

## 2021-09-16 ENCOUNTER — Ambulatory Visit: Payer: 59 | Admitting: Obstetrics and Gynecology

## 2021-09-21 ENCOUNTER — Other Ambulatory Visit: Payer: Self-pay | Admitting: Nurse Practitioner

## 2021-09-21 ENCOUNTER — Other Ambulatory Visit: Payer: Self-pay

## 2021-09-21 MED ORDER — WEGOVY 0.5 MG/0.5ML ~~LOC~~ SOAJ
0.5000 mg | SUBCUTANEOUS | 4 refills | Status: DC
  Filled 2021-09-21: qty 2, 28d supply, fill #0

## 2021-09-24 ENCOUNTER — Other Ambulatory Visit: Payer: Self-pay | Admitting: Unknown Physician Specialty

## 2021-09-24 DIAGNOSIS — E041 Nontoxic single thyroid nodule: Secondary | ICD-10-CM

## 2021-09-25 IMAGING — MR MR LUMBAR SPINE W/O CM
4 of 5 series · 27 of 48 positions shown · non-contrast
Comparison: Lumbar radiographs 05/04/2020.

CLINICAL DATA: 28-year-old female with persistent low back pain
radiating to the buttocks.

EXAM:
MRI LUMBAR SPINE WITHOUT CONTRAST
TECHNIQUE: Multiplanar, multisequence MR imaging of the lumbar spine was
performed. No intravenous contrast was administered.

[Series 2: T2 · sagittal · 4.0mm · 1.09mm/px · 6 of 16 slices shown (1 of 2)]
[im 1/16]
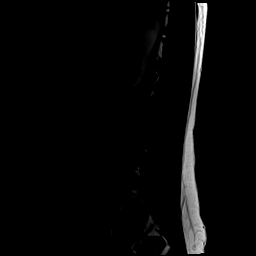
[im 4/16]
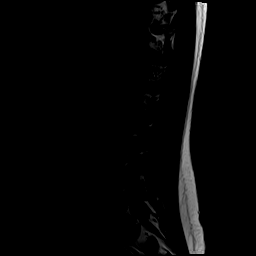
[im 7/16]
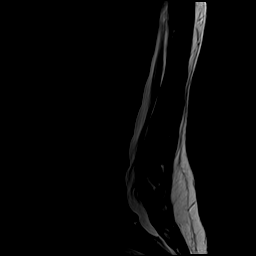
[im 10/16]
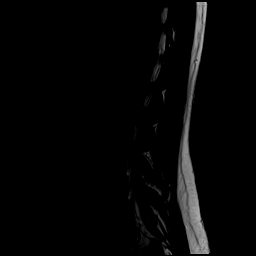
[im 13/16]
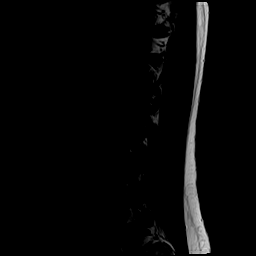
[im 16/16]
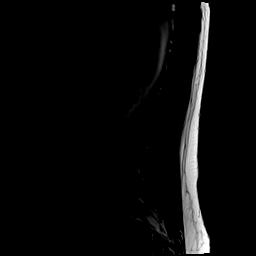

[Series 4: T1 · sagittal · 4.0mm · 1.09mm/px · 6 of 16 slices shown (1 of 2)]
[im 1/16]
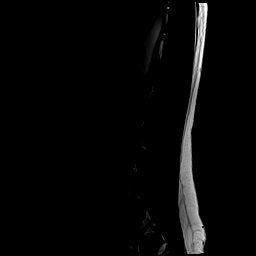
[im 4/16]
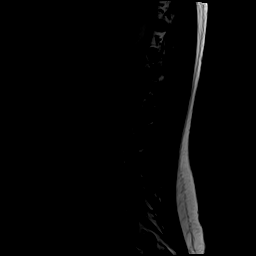
[im 7/16]
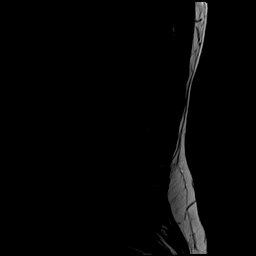
[im 10/16]
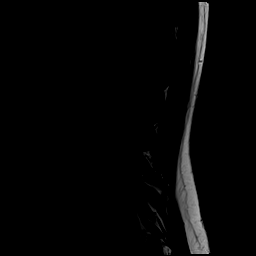
[im 13/16]
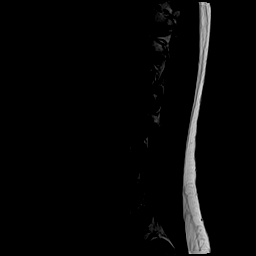
[im 16/16]
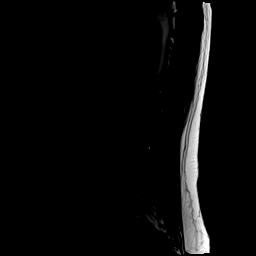

[Series 6: T1 · axial · 4.0mm · 0.39mm/px · z∈[-95,+80]mm · 6 of 42 slices shown (2 of 2)]
[im 1/42]
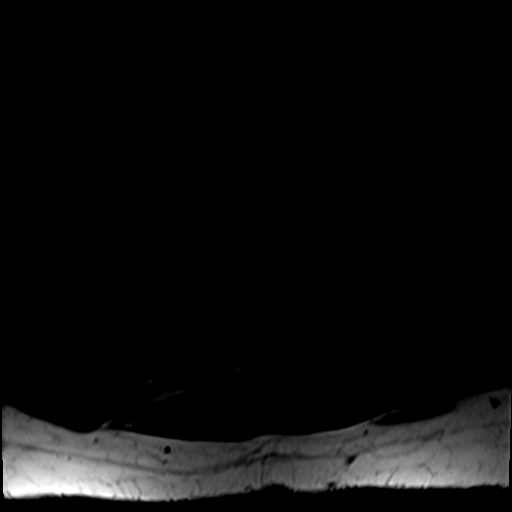
[im 6/42]
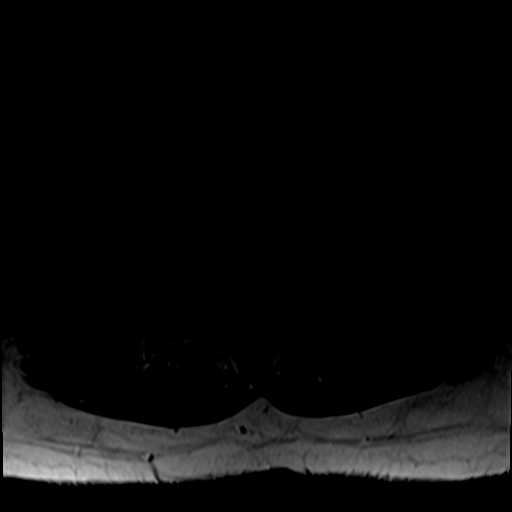
[im 12/42]
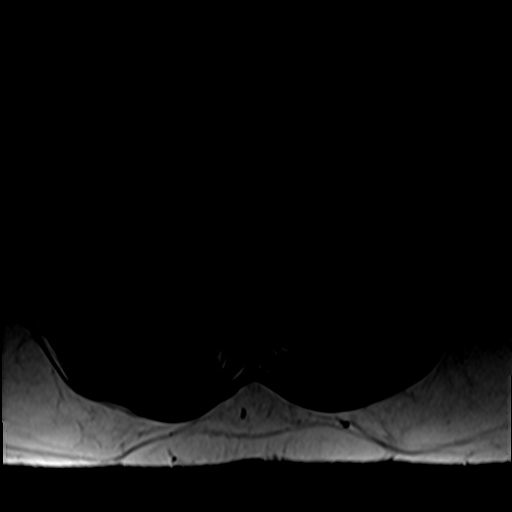
[im 18/42]
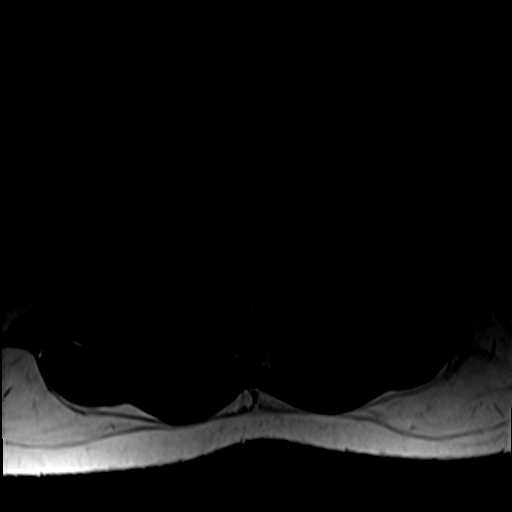
[im 21/42]
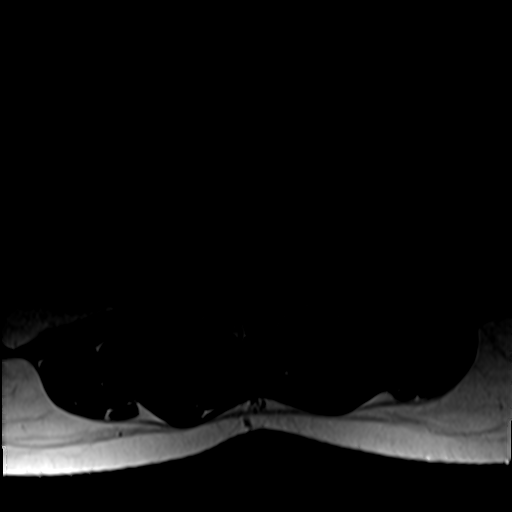
[im 36/42]
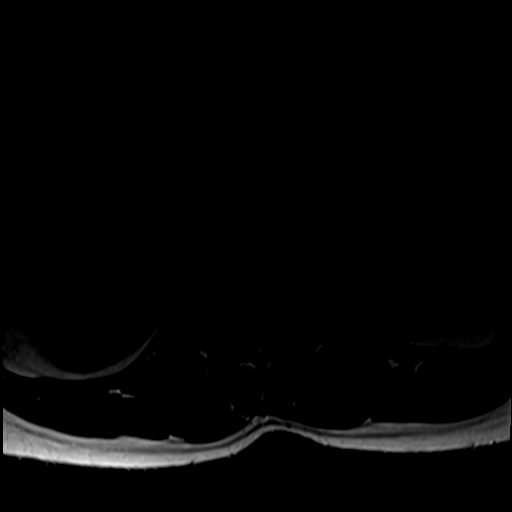

[Series 7: T2 · axial · 4.0mm · 0.39mm/px · z∈[-95,+108]mm · 9 of 42 slices shown (2 of 2)]
[im 1/42]
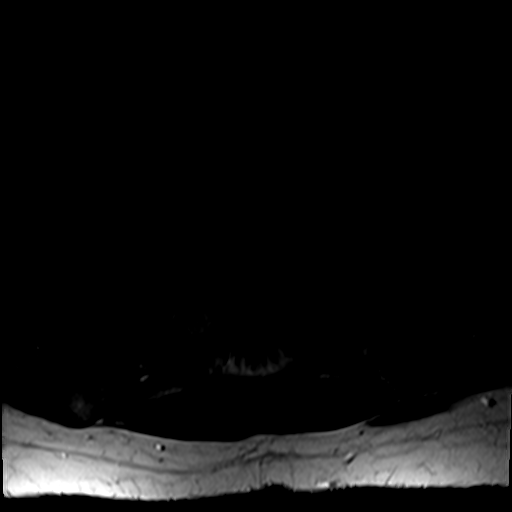
[im 6/42]
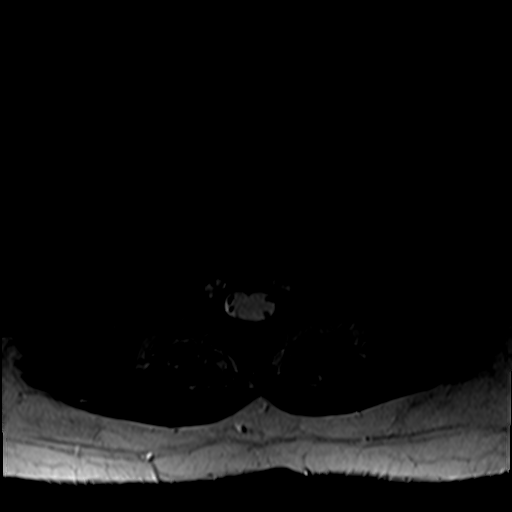
[im 12/42]
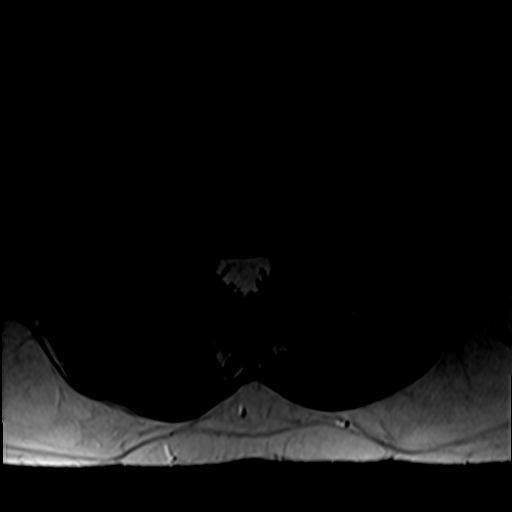
[im 18/42]
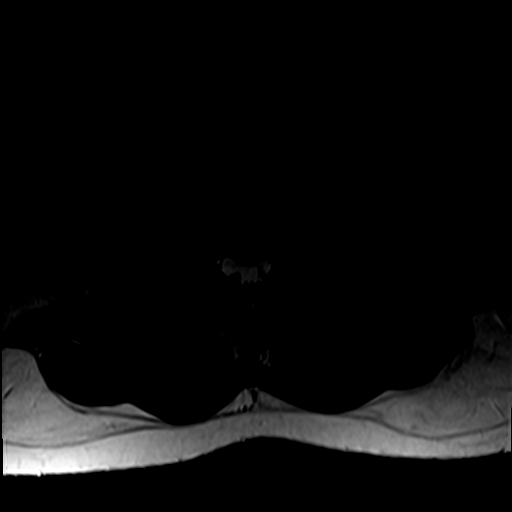
[im 21/42]
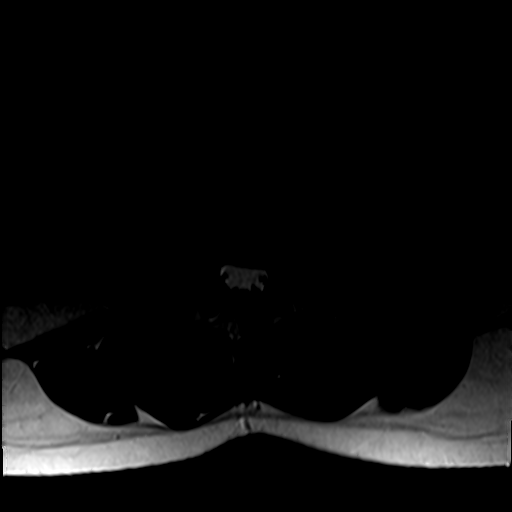
[im 24/42]
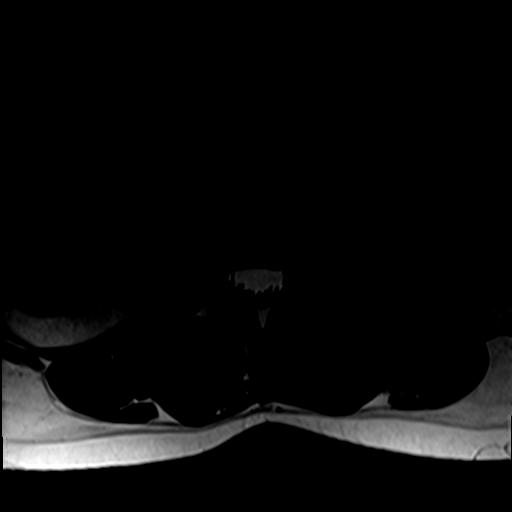
[im 30/42]
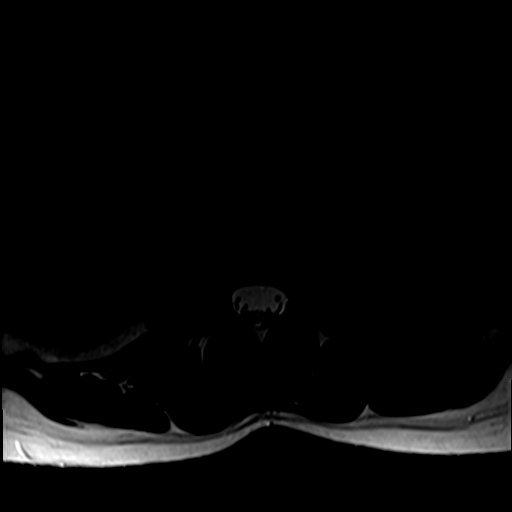
[im 36/42]
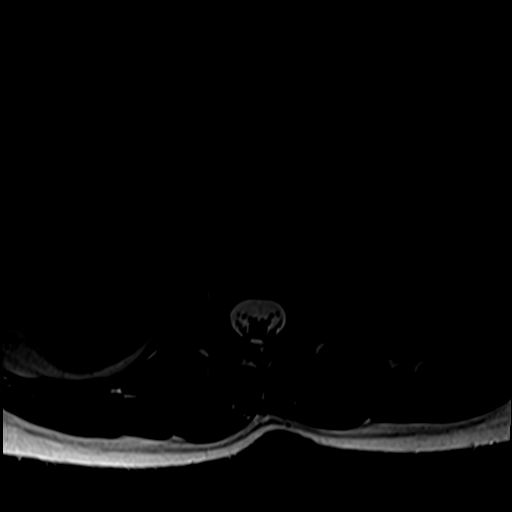
[im 42/42]
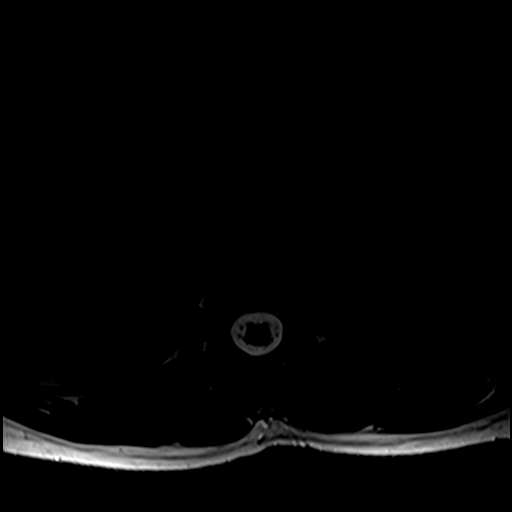

[27 of 48 positions shown; findings below may reference images not displayed]

FINDINGS: Segmentation:  Normal on the comparison.

Alignment:  Stable lumbar lordosis.  No spondylolisthesis.

Vertebrae: Chronic degenerative endplate marrow signal changes at
L5-S1. Normal background bone marrow signal. No marrow edema or
evidence of acute osseous abnormality. Intact visible sacrum and SI
joints.

Conus medullaris and cauda equina: Conus extends to the L1 level. No
lower spinal cord or conus signal abnormality.

Paraspinal and other soft tissues: Negative.

Disc levels:

The visible lower thoracic levels through L2-L3 are negative.

L3-L4: Mild disc desiccation and broad-based but small posterior
disc protrusion (series 7, image 24) mild facet hypertrophy. No
significant stenosis.

L4-L5: Disc desiccation. Subtle posterior annular fissure of the
disc. Mild facet hypertrophy. No stenosis.

L5-S1: Disc desiccation and disc space loss. Small to moderate
broad-based central to slightly left paracentral disc extrusion
(series 2, image 10 and series 7, image 36). This abuts the
descending S1 nerve roots in the lateral recess. No significant
spinal stenosis. No definite foraminal involvement.
IMPRESSION: Lower lumbar disc degeneration, with small disc herniations at L3-L4
and L5-S1.
Disc at L5-S1 abuts the descending left S1 nerve roots in the
lateral recesses. No significant spinal stenosis.

## 2021-10-04 ENCOUNTER — Ambulatory Visit
Admission: RE | Admit: 2021-10-04 | Discharge: 2021-10-04 | Disposition: A | Discharge status: Home / Self Care | Payer: No Typology Code available for payment source | Source: Ambulatory Visit | Attending: Unknown Physician Specialty | Admitting: Unknown Physician Specialty

## 2021-10-04 DIAGNOSIS — D34 Benign neoplasm of thyroid gland: Secondary | ICD-10-CM | POA: Diagnosis not present

## 2021-10-04 DIAGNOSIS — E041 Nontoxic single thyroid nodule: Secondary | ICD-10-CM

## 2021-10-04 NOTE — Procedures (Signed)
Successful US guided FNA of isthmus thyroid nodule No complications. See PACS for full report.  Hedy Jacob, PA-C 10/04/2021, 1:42 PM

## 2021-10-05 ENCOUNTER — Other Ambulatory Visit (HOSPITAL_COMMUNITY): Payer: Self-pay | Admitting: Unknown Physician Specialty

## 2021-10-05 ENCOUNTER — Other Ambulatory Visit: Payer: Self-pay | Admitting: Unknown Physician Specialty

## 2021-10-05 DIAGNOSIS — E041 Nontoxic single thyroid nodule: Secondary | ICD-10-CM

## 2021-10-05 LAB — CYTOLOGY - NON PAP

## 2021-10-20 ENCOUNTER — Other Ambulatory Visit: Payer: Self-pay

## 2021-10-20 MED ORDER — WEGOVY 1 MG/0.5ML ~~LOC~~ SOAJ
1.0000 mg | SUBCUTANEOUS | 4 refills | Status: DC
  Filled 2021-10-20: qty 2, 28d supply, fill #0

## 2021-10-20 NOTE — Telephone Encounter (Signed)
Lmom for patient to call back to reschedule appointment to 3/8.

## 2021-11-10 ENCOUNTER — Ambulatory Visit: Payer: 59 | Admitting: Nurse Practitioner

## 2021-11-11 ENCOUNTER — Ambulatory Visit: Payer: 59 | Admitting: Nurse Practitioner

## 2021-11-14 NOTE — Patient Instructions (Signed)

## 2021-11-17 ENCOUNTER — Other Ambulatory Visit: Payer: Self-pay

## 2021-11-17 MED ORDER — WEGOVY 1.7 MG/0.75ML ~~LOC~~ SOAJ
1.7000 mg | SUBCUTANEOUS | 4 refills | Status: DC
  Filled 2021-11-17: qty 3, 28d supply, fill #0

## 2021-11-17 NOTE — Addendum Note (Signed)
Addended by: Marnee Guarneri T on: 11/17/2021 12:00 PM ? ? Modules accepted: Orders ? ?

## 2021-11-18 ENCOUNTER — Other Ambulatory Visit: Payer: Self-pay

## 2021-11-19 ENCOUNTER — Ambulatory Visit (INDEPENDENT_AMBULATORY_CARE_PROVIDER_SITE_OTHER): Payer: No Typology Code available for payment source | Admitting: Nurse Practitioner

## 2021-11-19 ENCOUNTER — Other Ambulatory Visit: Payer: Self-pay

## 2021-11-19 ENCOUNTER — Encounter: Payer: Self-pay | Admitting: Nurse Practitioner

## 2021-11-19 VITALS — BP 100/70 | HR 57 | Temp 97.6°F | Wt 185.2 lb

## 2021-11-19 DIAGNOSIS — Z6829 Body mass index (BMI) 29.0-29.9, adult: Secondary | ICD-10-CM

## 2021-11-19 DIAGNOSIS — G43009 Migraine without aura, not intractable, without status migrainosus: Secondary | ICD-10-CM | POA: Diagnosis not present

## 2021-11-19 DIAGNOSIS — F419 Anxiety disorder, unspecified: Secondary | ICD-10-CM | POA: Diagnosis not present

## 2021-11-19 DIAGNOSIS — E039 Hypothyroidism, unspecified: Secondary | ICD-10-CM

## 2021-11-19 DIAGNOSIS — F32A Depression, unspecified: Secondary | ICD-10-CM

## 2021-11-19 MED ORDER — BUPROPION HCL ER (XL) 300 MG PO TB24
300.0000 mg | ORAL_TABLET | Freq: Every day | ORAL | 4 refills | Status: DC
  Filled 2021-11-19: qty 90, 90d supply, fill #0
  Filled 2022-04-14: qty 17, 17d supply, fill #1
  Filled 2022-04-14: qty 73, 73d supply, fill #1
  Filled 2022-07-27: qty 90, 90d supply, fill #2
  Filled 2022-09-30: qty 90, 90d supply, fill #3

## 2021-11-19 MED ORDER — BUSPIRONE HCL 7.5 MG PO TABS
7.5000 mg | ORAL_TABLET | Freq: Two times a day (BID) | ORAL | 4 refills | Status: DC
  Filled 2021-11-19: qty 180, 90d supply, fill #0
  Filled 2022-04-14: qty 180, 90d supply, fill #1
  Filled 2022-07-27: qty 180, 90d supply, fill #2
  Filled 2022-09-30: qty 180, 90d supply, fill #3

## 2021-11-19 MED ORDER — GABAPENTIN 600 MG PO TABS
300.0000 mg | ORAL_TABLET | Freq: Every day | ORAL | 4 refills | Status: DC
  Filled 2021-11-19: qty 45, 90d supply, fill #0
  Filled 2022-04-14: qty 45, 90d supply, fill #1
  Filled 2022-07-27: qty 45, 90d supply, fill #2
  Filled 2022-11-02: qty 45, 90d supply, fill #3

## 2021-11-19 NOTE — Assessment & Plan Note (Signed)
Chronic, stable without medication.  Recent levels stable.  Thyroid biopsy negative.  Is scheduled to see endo upcoming. ?

## 2021-11-19 NOTE — Progress Notes (Signed)
? ?BP 100/70   Pulse (!) 57   Temp 97.6 ?F (36.4 ?C) (Oral)   Wt 185 lb 3.2 oz (84 kg)   SpO2 99%   BMI 29.89 kg/m?   ? ?Subjective:  ? ? Patient ID: Jennifer Morse, female    DOB: 1991-05-02, 31 y.o.   MRN: [id number] ? ?HPI: ?Jennifer Morse is a 31 y.o. female ? ?Chief Complaint  ?Patient presents with  ? Migraine  ? Mood  ? ?MIGRAINES ?Currently takes Excedrin only.  Typically gets headache 1-2 times a week.  Has tried Propranolol in past, which lowered heart rate + Topamax in past -- still got headaches. Took Imitrex and Maxalt as needed.  Currently is taking Gabapentin nightly, which she also takes for degenerative disc. ?Duration: chronic ?Onset: gradual ?Severity: 10/10 ?Quality: dull, aching, and throbbing ?Frequency: intermittent ?Location: temples all the way back into neck ?Headache duration: ?Radiation: no ?Time of day headache occurs: varies ?Alleviating factors: Excedrin ?Aggravating factors: rainy days ?Headache status at time of visit: dull one this morning ?Treatments attempted: as above   ?Aura: no ?Nausea:  no ?Vomiting: no ?Photophobia:  yes ?Phonophobia:  yes ?Effect on social functioning:  yes ?Numbers of missed days of school/work each month:  ?Confusion:  no ?Gait disturbance/ataxia:  no ?Behavioral changes:  no ?Fevers:  no  ?  ?HYPOTHYROIDISM ?After having son in 2018 she was diagnosed with nodules on thyroid, saw endocrinology at the time.  Her recent labs have been stable, but imaging on 08/26/21 did show need for biopsy, which was performed by ENT and returned negative on 10/04/21. ? ?Currently at 1.7 MG Wegovy, just increased, and is tolerating well for weight loss.  Has lost 11 pounds thus far. ?Thyroid control status:stable ?Fatigue: yes ?Cold intolerance: no ?Heat intolerance: no ?Weight gain: none ?Weight loss: no ?Constipation: yes ?Diarrhea/loose stools: no ?Palpitations: no ?Lower extremity edema: no ?Anxiety/depressed mood: yes  ?  ?ANXIETY/STRESS ?Her mom was  diagnosed with Stage 3 ovarian cancer in September. Patient had genetic testing done which was negative. ?  ?Currently taking Wellbutrin and Buspar == PRN Trazodone and Vistaril.   ?Duration:stable ?Anxious mood: mild at times ?Excessive worrying: yes ?Irritability: no  ?Sweating: no ?Nausea: no ?Palpitations:no ?Hyperventilation: no ?Panic attacks: none ?Agoraphobia: no  ?Obscessions/compulsions: no ?Depressed mood: yes ?Depression screen [facility] 2/9 11/19/2021 08/13/2021 08/05/2021 03/04/2021 03/04/2021  ?Decreased Interest '1 1 1 '$ 0 0  ?Down, Depressed, Hopeless 0 '1 1 1 1  '$ ?PHQ - 2 Score '1 2 2 1 1  '$ ?Altered sleeping 0 1 0 1 1  ?Tired, decreased energy '1 1 1 '$ 0 0  ?Change in appetite 0 1 1 0 0  ?Feeling bad or failure about yourself  '1 1 1 1 1  '$ ?Trouble concentrating 0 0 0 0 0  ?Moving slowly or fidgety/restless 0 0 0 0 0  ?Suicidal thoughts 0 0 0 0 -  ?PHQ-9 Score '3 6 5 3 3  '$ ?Difficult doing work/chores - Somewhat difficult Somewhat difficult Somewhat difficult Somewhat difficult  ?Some recent data might be hidden  ?  ?GAD 7 : Generalized Anxiety Score 11/19/2021 08/13/2021 08/05/2021 10/22/2020  ?Nervous, Anxious, on Edge '1 1 1 1  '$ ?Control/stop worrying 0 '1 1 2  '$ ?Worry too much - different things '1 1 1 2  '$ ?Trouble relaxing 0 1 0 0  ?Restless 0 0 0 0  ?Easily annoyed or irritable '1 2 1 2  '$ ?Afraid - awful might happen 0 '1 1 1  '$ ?Total  GAD 7 Score '3 7 5 8  '$ ?Anxiety Difficulty Not difficult at all Somewhat difficult Somewhat difficult Somewhat difficult  ? ?  ? ?Relevant past medical, surgical, family and social history reviewed and updated as indicated. Interim medical history since our last visit reviewed. ?Allergies and medications reviewed and updated. ? ?Review of Systems  ?Constitutional:  Negative for activity change, appetite change, diaphoresis, fatigue and fever.  ?Respiratory:  Negative for cough, chest tightness and shortness of breath.   ?Cardiovascular:  Negative for chest pain, palpitations and leg swelling.   ?Gastrointestinal: Negative.   ?Endocrine: Negative.   ?Neurological: Negative.   ?Psychiatric/Behavioral: Negative.    ? ?Per HPI unless specifically indicated above ? ?   ?Objective:  ?  ?BP 100/70   Pulse (!) 57   Temp 97.6 ?F (36.4 ?C) (Oral)   Wt 185 lb 3.2 oz (84 kg)   SpO2 99%   BMI 29.89 kg/m?   ?Wt Readings from Last 3 Encounters:  ?11/19/21 185 lb 3.2 oz (84 kg)  ?08/13/21 196 lb 12.8 oz (89.3 kg)  ?03/04/21 180 lb 6.4 oz (81.8 kg)  ?  ?Physical Exam ?Vitals and nursing note reviewed.  ?Constitutional:   ?   General: She is awake. She is not in acute distress. ?   Appearance: She is well-developed, well-groomed and overweight. She is not ill-appearing or toxic-appearing.  ?HENT:  ?   Head: Normocephalic.  ?   Right Ear: Hearing normal.  ?   Left Ear: Hearing normal.  ?Eyes:  ?   General: Lids are normal.     ?   Right eye: No discharge.     ?   Left eye: No discharge.  ?   Conjunctiva/sclera: Conjunctivae normal.  ?   Pupils: Pupils are equal, round, and reactive to light.  ?Neck:  ?   Thyroid: No thyromegaly.  ?   Vascular: No carotid bruit.  ?Cardiovascular:  ?   Rate and Rhythm: Normal rate and regular rhythm.  ?   Heart sounds: Normal heart sounds. No murmur heard. ?  No gallop.  ?Pulmonary:  ?   Effort: Pulmonary effort is normal. No accessory muscle usage or respiratory distress.  ?   Breath sounds: Normal breath sounds.  ?Abdominal:  ?   General: Bowel sounds are normal.  ?   Palpations: Abdomen is soft. There is no hepatomegaly or splenomegaly.  ?Musculoskeletal:  ?   Cervical back: Normal range of motion and neck supple.  ?   Right lower leg: No edema.  ?   Left lower leg: No edema.  ?Lymphadenopathy:  ?   Cervical: No cervical adenopathy.  ?Skin: ?   General: Skin is warm and dry.  ?Neurological:  ?   Mental Status: She is alert and oriented to person, place, and time.  ?   Deep Tendon Reflexes: Reflexes are normal and symmetric.  ?   Reflex Scores: ?     Brachioradialis reflexes are 2+ on  the right side and 2+ on the left side. ?     Patellar reflexes are 2+ on the right side and 2+ on the left side. ?Psychiatric:     ?   Attention and Perception: Attention normal.     ?   Mood and Affect: Mood normal.     ?   Speech: Speech normal.     ?   Behavior: Behavior normal. Behavior is cooperative.     ?   Thought Content: Thought content normal.  ? ?  Results for orders placed or performed during the hospital encounter of 10/04/21  ?Cytology - Non PAP;  ?Result Value Ref Range  ? CYTOLOGY - NON GYN    ?  CYTOLOGY - NON PAP ?CASE: [id number] ?PATIENT: Sher Chisum ?Non-Gynecological Cytology Report ? ? ? ? ?Specimen Submitted: ?A. Thyroid, midline isthmus nodule ? ?Clinical History: None provided ? ? ? ?DIAGNOSIS: ?A. THYROID GLAND, MIDLINE ISTHMUS; ULTRASOUND-GUIDED FINE-NEEDLE ?ASPIRATION: ?- BENIGN (BETHESDA CATEGORY II). ? ?Comment: ?Smears display an adequately cellular specimen, comprised of clusters of ?benign appearing follicular epithelial cells in a background of blood ?and few inflammatory cells.  Follicular cells are without significant ?nuclear crowding or overlap.  There are no nuclear grooves or ?inclusions.  Colloid material is present.  There are numerous ?macrophages.  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 ? ?Slides reviewed: 1 ThinPrep, 3 Diff-Quik, 3 Pap stain ? ?GROSS DESCRIPTION: ?A. Site: Midline isthmus thyroid nodule ?Procedure: Ultrasound-guided FNA ?Cytotechnologist(s):  Boyce Medici ? ?Specimen material collected and submitted for: ?3 diff Quik stained slides ?3 Pap stained slides ?Material for ThyroSeq if needed, labeled with the patient?s name, date ?of birth, specimen site, and collection date ?ThinPrep: 1 ? ?Specimen description: ?Fixative: CytoLyt ?Volume: Approximately 20 mL ?Color: Pink ?Transparency: Clear ?Tissue fragments present: No ? ?CM 10/04/2021 ? ? ?Final Diagnosis performed  by Allena Napoleon, MD.   Electronically signed ?10/05/2021 2:43:31PM ?The electronic signature indicates that the named Attending Pathologist ?has evaluated the specimen ?Technical component performed at [facility], [address]

## 2021-11-19 NOTE — Assessment & Plan Note (Signed)
BMI 29.89.  Has lost 11 pounds thus far with Wegovy, just increased to 1.7 MG.  Recommended eating smaller high protein, low fat meals more frequently and exercising 30 mins a day 5 times a week with a goal of 10-15lb weight loss in the next 3 months. Patient voiced their understanding and motivation to adhere to these recommendations. ?

## 2021-11-19 NOTE — Assessment & Plan Note (Signed)
Chronic, ongoing.  Poorly tolerated Propranolol in past due to at baseline lower resting HR.  Did not tolerate Topamax. Will increase Gabapentin to 300 MG nightly.  Educated on this and migraine prevention.  Continue Excedrin as needed.  May trial Effexor in future, which would offer benefit for mood and migraines, however weight gain is a concern for patient.  Return in 6 months. ?

## 2021-11-19 NOTE — Assessment & Plan Note (Signed)
Chronic, stable without SI/HI.  Currently her mother has ovarian cancer.  Will maintain Wellbutrin and Buspar + Vistaril.  Trazodone as needed for sleep.  Monitor mood closely and adjust regimen as needed.   ?

## 2021-11-21 ENCOUNTER — Ambulatory Visit
Admission: EM | Admit: 2021-11-21 | Discharge: 2021-11-21 | Disposition: A | Discharge status: Home / Self Care | Payer: No Typology Code available for payment source | Attending: Emergency Medicine | Admitting: Emergency Medicine

## 2021-11-21 ENCOUNTER — Ambulatory Visit (INDEPENDENT_AMBULATORY_CARE_PROVIDER_SITE_OTHER): Payer: No Typology Code available for payment source

## 2021-11-21 ENCOUNTER — Other Ambulatory Visit: Payer: Self-pay

## 2021-11-21 DIAGNOSIS — N760 Acute vaginitis: Secondary | ICD-10-CM

## 2021-11-21 DIAGNOSIS — R109 Unspecified abdominal pain: Secondary | ICD-10-CM | POA: Diagnosis not present

## 2021-11-21 DIAGNOSIS — B9689 Other specified bacterial agents as the cause of diseases classified elsewhere: Secondary | ICD-10-CM

## 2021-11-21 DIAGNOSIS — B3731 Acute candidiasis of vulva and vagina: Secondary | ICD-10-CM

## 2021-11-21 LAB — COMPREHENSIVE METABOLIC PANEL
ALT: 14 U/L (ref 0–44)
AST: 16 U/L (ref 15–41)
Albumin: 4.9 g/dL (ref 3.5–5.0)
Alkaline Phosphatase: 49 U/L (ref 38–126)
Anion gap: 8 (ref 5–15)
BUN: 12 mg/dL (ref 6–20)
CO2: 25 mmol/L (ref 22–32)
Calcium: 9.2 mg/dL (ref 8.9–10.3)
Chloride: 104 mmol/L (ref 98–111)
Creatinine, Ser: 0.9 mg/dL (ref 0.44–1.00)
GFR, Estimated: >60 mL/min (ref >60)
Glucose, Bld: 82 mg/dL (ref 70–99)
Potassium: 3.8 mmol/L (ref 3.5–5.1)
Sodium: 137 mmol/L (ref 135–145)
Total Bilirubin: 0.8 mg/dL (ref 0.3–1.2)
Total Protein: 7.9 g/dL (ref 6.5–8.1)

## 2021-11-21 LAB — URINALYSIS, ROUTINE W REFLEX MICROSCOPIC
Bilirubin Urine: NEGATIVE
Glucose, UA: NEGATIVE mg/dL
Hgb urine dipstick: NEGATIVE
Ketones, ur: NEGATIVE mg/dL
Nitrite: NEGATIVE
Protein, ur: NEGATIVE mg/dL
Specific Gravity, Urine: 1.025 (ref 1.005–1.030)
pH: 6.5 (ref 5.0–8.0)

## 2021-11-21 LAB — CBC WITH DIFFERENTIAL/PLATELET
Abs Immature Granulocytes: 0.01 10*3/uL (ref 0.00–0.07)
Basophils Absolute: 0.1 10*3/uL (ref 0.0–0.1)
Basophils Relative: 1 %
Eosinophils Absolute: 0.2 10*3/uL (ref 0.0–0.5)
Eosinophils Relative: 4 %
HCT: 41.7 % (ref 36.0–46.0)
Hemoglobin: 14.2 g/dL (ref 12.0–15.0)
Immature Granulocytes: 0 %
Lymphocytes Relative: 46 %
Lymphs Abs: 2 10*3/uL (ref 0.7–4.0)
MCH: 30 pg (ref 26.0–34.0)
MCHC: 34.1 g/dL (ref 30.0–36.0)
MCV: 88 fL (ref 80.0–100.0)
Monocytes Absolute: 0.3 10*3/uL (ref 0.1–1.0)
Monocytes Relative: 8 %
Neutro Abs: 1.8 10*3/uL (ref 1.7–7.7)
Neutrophils Relative %: 41 %
Platelets: 150 10*3/uL (ref 150–400)
RBC: 4.74 MIL/uL (ref 3.87–5.11)
RDW: 12.6 % (ref 11.5–15.5)
WBC: 4.4 10*3/uL (ref 4.0–10.5)
nRBC: 0 % (ref 0.0–0.2)

## 2021-11-21 LAB — URINALYSIS, MICROSCOPIC (REFLEX)

## 2021-11-21 LAB — WET PREP, GENITAL
Sperm: NONE SEEN
Trich, Wet Prep: NONE SEEN
WBC, Wet Prep HPF POC: <10 (ref <10)

## 2021-11-21 LAB — LIPASE, BLOOD: Lipase: 34 U/L (ref 11–51)

## 2021-11-21 MED ORDER — FLUCONAZOLE 200 MG PO TABS: 200.0000 mg | ORAL_TABLET | Freq: Every day | ORAL | 1 refills | Status: AC

## 2021-11-21 MED ORDER — METRONIDAZOLE 500 MG PO TABS: 500.0000 mg | ORAL_TABLET | Freq: Two times a day (BID) | ORAL | 0 refills | Status: DC

## 2021-11-21 NOTE — Discharge Instructions (Addendum)
Take the Flagyl (metronidazole) 500 mg twice daily for treatment of your bacterial vaginosis. ? ?Avoid alcohol while on the metronidazole as taken together will cause of vomiting. ? ?Bacterial vaginosis is often caused by a imbalance of bacteria in your vaginal vault.  This is sometimes a result of using tampons or hormonal fluctuations during her menstrual cycle. ? ?You if your symptoms are recurrent you can try using a boric acid suppository twice weekly to help maintain the acid-base balance in your vagina vault which could prevent further infection. ? ?You can also try vaginal probiotics to help return normal bacterial balance.  ? ?Take the Diflucan for your vaginal yeast infection.  Take 1 tablet now and repeat in 7 days if you are still symptomatic. ?

## 2021-11-21 NOTE — ED Triage Notes (Signed)
Pt c/o abdominal pain x2-3 days.  ? ?Pt states that it is painful to move, bend over, and cough. Pain is located on the left side of abdomen and when it begins to hurt, it extends to the lower right abdomen near the groin.  ? ?Pt has taken OTC ibuprofen and Tizanidine at 10pm.  ? ?Pt states that she is constipated and does not remember her last bowel movement. She has not used the restroom in the last 2 days and she states that she is not regular and has difficult bowel movements.  ?

## 2021-11-21 NOTE — ED Provider Notes (Signed)
?[facility] ? ? ? ?CSN: [id number] ?Arrival date & time: 11/21/21  1962 ? ? ?  ? ?History   ?Chief Complaint ?Chief Complaint  ?Patient presents with  ? Abdominal Pain  ? Constipation  ? ? ?HPI ?Jennifer Morse is a 31 y.o. female.  ? ?HPI ? ?31 year old female here for evaluation of abdominal pain. ? ?Patient reports that she has been experiencing abdominal pain for last 2 to 3 days that is mostly located in the left upper quadrant.  The pain does increase with movement, bending, and coughing.  She states that sometimes when she has a flare of pain in her left upper quadrant it will radiate to her right lower quadrant.  This is also caused her to have a decrease in appetite.  She has taken ibuprofen without any relief of her symptoms.  She denies any fever, vomiting, abdominal stanchion, dysuria, urgency, or frequency of urination.  She also denies any vaginal signs symptoms.  She does report that she has a history of constipation and typically goes 2 or 3 days without having a bowel movement.  She not real clear on when her last bowel movement was. ? ?Past Medical History:  ?Diagnosis Date  ? Anxiety   ? BRCA negative 07/2021  ? MyRisk neg; IBIS=11.0%.riskscore=15.8%  ? Degenerative lumbar disc   ? Frequent headaches   ? Gestational thrombocytopenia ([place])   ? Lumbar herniated disc   ? Migraine with aura, intractable, with status migrainosus 1995  ? Oligomenorrhea   ? Postpartum depression   ? with first pregnancy  ? ? ?Patient Active Problem List  ? Diagnosis Date Noted  ? Family history of ovarian cancer 08/02/2021  ? History of kidney stones 08/19/2020  ? Lumbar radiculopathy 05/13/2020  ? IUD (intrauterine device) in place 02/27/2020  ? BMI 29.0-29.9,adult 08/06/2018  ? Hypothyroidism 09/08/2017  ? External thrombosed hemorrhoids 05/04/2017  ? 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 Anxiety and depression 08/12/2015  ? Migraine 12/13/2013  ? ? ?Past Surgical History:  ?Procedure Laterality Date  ?  [place] EXTRACTION  2012  ? ? ?OB History   ? ? Gravida  ?2  ? Para  ?2  ? Term  ?2  ? Preterm  ?0  ? AB  ?0  ? Living  ?2  ?  ? ? SAB  ?0  ? IAB  ?0  ? Ectopic  ?0  ? Multiple  ?0  ? Live Births  ?2  ?   ?  ? Obstetric Comments  ?1st Menstrual Cycle:  15  ?1st Pregnancy:  24 ?  ?  ? ?  ? ? ? ?Home Medications   ? ?Prior to Admission medications   ?Medication Sig Start Date End Date Taking? Authorizing Provider  ?aspirin-acetaminophen-caffeine (EXCEDRIN MIGRAINE) 250-250-65 MG tablet Take by mouth every 6 (six) hours as needed for headache.   Yes [provider]  ?buPROPion (WELLBUTRIN XL) 300 MG 24 hr tablet Take 1 tablet (300 mg total) by mouth daily. 11/19/21  Yes Cannady, Jolene T, NP  ?busPIRone (BUSPAR) 7.5 MG tablet Take 1 tablet (7.5 mg total) by mouth 2 (two) times daily. 11/19/21  Yes Cannady, Henrine Screws T, NP  ?fluconazole (DIFLUCAN) 200 MG tablet Take 1 tablet (200 mg total) by mouth daily for 2 doses. 11/21/21 11/23/21 Yes Margarette Canada, NP  ?gabapentin (NEURONTIN) 600 MG tablet Take 0.5 tablets (300 mg total) by mouth at bedtime. 11/19/21  Yes Marnee Guarneri T, NP  ?hydrOXYzine (ATARAX/VISTARIL) 25  MG tablet Take 1 tablet (25 mg total) by mouth every 6 (six) hours as needed for anxiety. 07/22/21  Yes Malachy Mood, MD  ?levonorgestrel (MIRENA) 20 MCG/24HR IUD 1 each by Intrauterine route once.   Yes [provider]  ?metroNIDAZOLE (FLAGYL) 500 MG tablet Take 1 tablet (500 mg total) by mouth 2 (two) times daily. 11/21/21  Yes Margarette Canada, NP  ?Semaglutide-Weight Management (WEGOVY) 1.7 MG/0.75ML SOAJ Inject 1.7 mg into the skin once a week. 11/17/21  Yes Marnee Guarneri T, NP  ?tiZANidine-Liniment (TIZANIDINE COMFORT PAC) 4 MG MISC  08/18/20  Yes [provider]  ?traZODone (DESYREL) 50 MG tablet Take 1 tablet (50 mg total) by mouth at bedtime as needed for sleep. 07/22/21  Yes Malachy Mood, MD  ?azelastine (ASTELIN) 0.1 % nasal spray Place 1 spray into both nostrils 2  (two) times daily. Use in each nostril as directed ?Patient taking differently: No sig reported 07/31/18 05/04/20  Darlin Priestly, PA-C  ?phentermine 37.5 MG capsule Take 1 capsule (37.5 mg total) by mouth every morning. 06/22/20 11/27/20  Diona Fanti, CNM  ? ? ?Family History ?Family History  ?Problem Relation Age of Onset  ? Diabetes Mother   ? Diabetes Sister   ? Diabetes Maternal Grandmother   ? Heart disease Maternal Grandmother   ? Hypertension Maternal Grandmother   ? Kidney cancer Maternal Grandmother   ? Diabetes Maternal Grandfather   ? Heart disease Maternal Grandfather   ? Hypertension Maternal Grandfather   ? Diabetes Maternal Aunt   ? Breast cancer Neg Hx   ? Ovarian cancer Neg Hx   ? Colon cancer Neg Hx   ? ? ?Social History ?Social History  ? ?Tobacco Use  ? Smoking status: Former  ?  Packs/day: 0.25  ?  Types: Cigarettes  ?  Quit date: 02/26/2015  ?  Years since quitting: 6.7  ? Smokeless tobacco: Never  ?Vaping Use  ? Vaping Use: Never used  ?Substance Use Topics  ? Alcohol use: Yes  ?  Alcohol/week: 1.0 standard drink  ?  Types: 1 Glasses of wine per week  ? Drug use: No  ? ? ? ?Allergies   ?Patient has no known allergies. ? ? ?Review of Systems ?Review of Systems  ?Constitutional:  Positive for appetite change. Negative for activity change and fever.  ?Gastrointestinal:  Positive for abdominal pain and constipation. Negative for abdominal distention, diarrhea, nausea and vomiting.  ?Genitourinary:  Negative for dysuria, frequency, hematuria, urgency, vaginal bleeding, vaginal discharge and vaginal pain.  ?Skin:  Negative for rash.  ?Hematological: Negative.   ?Psychiatric/Behavioral: Negative.    ? ? ?Physical Exam ?Triage Vital Signs ?ED Triage Vitals  ?Enc Vitals Group  ?   BP 11/21/21 0854 113/75  ?   Pulse Rate 11/21/21 0854 75  ?   Resp 11/21/21 0854 18  ?   Temp 11/21/21 0854 98.6 ?F (37 ?C)  ?   Temp Source 11/21/21 0854 Oral  ?   SpO2 11/21/21 0854 98 %  ?   Weight 11/21/21  0852 185 lb (83.9 kg)  ?   Height 11/21/21 0852 '5\' 8"'  (1.727 m)  ?   Head Circumference --   ?   Peak Flow --   ?   Pain Score 11/21/21 0848 6  ?   Pain Loc --   ?   Pain Edu? --   ?   Excl. in [place]? --   ? ?No data found. ? ?Updated Vital Signs ?BP  113/75 (BP Location: Left Arm)   Pulse 75   Temp 98.6 ?F (37 ?C) (Oral)   Resp 18   Ht '5\' 8"'  (1.727 m)   Wt 185 lb (83.9 kg)   SpO2 98%   BMI 28.13 kg/m?  ? ?Visual Acuity ?Right Eye Distance:   ?Left Eye Distance:   ?Bilateral Distance:   ? ?Right Eye Near:   ?Left Eye Near:    ?Bilateral Near:    ? ?Physical Exam ?Vitals and nursing note reviewed.  ?Constitutional:   ?   Appearance: Normal appearance. She is not ill-appearing.  ?HENT:  ?   Head: Normocephalic and atraumatic.  ?Cardiovascular:  ?   Rate and Rhythm: Normal rate and regular rhythm.  ?   Pulses: Normal pulses.  ?   Heart sounds: Normal heart sounds. No murmur heard. ?  No friction rub. No gallop.  ?Pulmonary:  ?   Effort: Pulmonary effort is normal.  ?   Breath sounds: Normal breath sounds. No wheezing, rhonchi or rales.  ?Abdominal:  ?   General: Abdomen is flat. Bowel sounds are normal.  ?   Palpations: Abdomen is soft.  ?   Tenderness: There is abdominal tenderness. There is no guarding or rebound.  ?Skin: ?   General: Skin is warm and dry.  ?   Capillary Refill: Capillary refill takes less than 2 seconds.  ?   Findings: No erythema or rash.  ?Neurological:  ?   General: No focal deficit present.  ?   Mental Status: She is alert and oriented to person, place, and time.  ?Psychiatric:     ?   Mood and Affect: Mood normal.     ?   Behavior: Behavior normal.     ?   Thought Content: Thought content normal.     ?   Judgment: Judgment normal.  ? ? ? ?UC Treatments / Results  ?Labs ?(all labs ordered are listed, but only abnormal results are displayed) ?Labs Reviewed  ?WET PREP, GENITAL - Abnormal; Notable for the following components:  ?    Result Value  ? Yeast Wet Prep HPF POC PRESENT (*)   ? Clue  Cells Wet Prep HPF POC PRESENT (*)   ? All other components within normal limits  ?URINALYSIS, ROUTINE W REFLEX MICROSCOPIC - Abnormal; Notable for the following components:  ? APPearance HAZY (*)   ? Leukocytes,Ua SMA

## 2021-11-22 ENCOUNTER — Other Ambulatory Visit: Payer: Self-pay

## 2021-12-15 ENCOUNTER — Other Ambulatory Visit: Payer: Self-pay

## 2021-12-15 ENCOUNTER — Other Ambulatory Visit: Payer: Self-pay | Admitting: Nurse Practitioner

## 2021-12-15 MED ORDER — WEGOVY 2.4 MG/0.75ML ~~LOC~~ SOAJ
2.4000 mg | SUBCUTANEOUS | 4 refills | Status: DC
  Filled 2021-12-15 – 2021-12-20 (×2): qty 3, 28d supply, fill #0
  Filled 2022-01-12: qty 3, 28d supply, fill #1
  Filled 2022-02-08: qty 3, 28d supply, fill #2
  Filled 2022-03-10: qty 3, 28d supply, fill #3
  Filled 2022-04-05: qty 3, 28d supply, fill #4

## 2021-12-15 NOTE — Progress Notes (Signed)
[place] increase ?

## 2021-12-20 ENCOUNTER — Other Ambulatory Visit: Payer: Self-pay

## 2021-12-21 ENCOUNTER — Other Ambulatory Visit: Payer: Self-pay

## 2022-01-12 ENCOUNTER — Other Ambulatory Visit: Payer: Self-pay

## 2022-01-17 ENCOUNTER — Encounter: Payer: Self-pay | Admitting: Internal Medicine

## 2022-01-17 ENCOUNTER — Encounter: Payer: Self-pay | Admitting: Nurse Practitioner

## 2022-01-17 ENCOUNTER — Ambulatory Visit: Payer: No Typology Code available for payment source | Admitting: Internal Medicine

## 2022-01-17 VITALS — BP 120/78 | HR 66 | Ht 68.0 in | Wt 176.0 lb

## 2022-01-17 DIAGNOSIS — Z87442 Personal history of urinary calculi: Secondary | ICD-10-CM | POA: Diagnosis not present

## 2022-01-17 DIAGNOSIS — R5382 Chronic fatigue, unspecified: Secondary | ICD-10-CM | POA: Diagnosis not present

## 2022-01-17 DIAGNOSIS — E041 Nontoxic single thyroid nodule: Secondary | ICD-10-CM | POA: Insufficient documentation

## 2022-01-17 LAB — VITAMIN D 25 HYDROXY (VIT D DEFICIENCY, FRACTURES): VITD: 26.66 ng/mL — ABNORMAL LOW (ref 30.00–100.00)

## 2022-01-17 LAB — T4, FREE: Free T4: 0.87 ng/dL (ref 0.60–1.60)

## 2022-01-17 LAB — TSH: TSH: 0.88 u[IU]/mL (ref 0.35–5.50)

## 2022-01-17 LAB — VITAMIN B12: Vitamin B-12: 305 pg/mL (ref 211–911)

## 2022-01-17 NOTE — Progress Notes (Signed)
? ? ?Name: Jennifer Morse  ?MRN/ DOB: [id number], [birth year]    ?Age/ Sex: 31 y.o., female   ? ?PCP: Venita Lick, NP   ?Reason for Endocrinology Evaluation: Thyroid nodule  ?   ?Date of Initial Endocrinology Evaluation: 01/17/2022   ? ? ?HPI: ?Ms. Jennifer Morse is a 31 y.o. female with a past medical history of migraine headaches, hypothyroid. The patient presented for initial endocrinology clinic visit on 01/17/2022 for consultative assistance with her thyroid nodule.  ? ?She has a Hx of hypothyroidism in 2018 . She was on LT-4 replacement for a couple years until she self discontinued  ? ? ?She has been diagnosed with an isthmic nodule at 1 cm in diameter in 2018.  Repeat ultrasound in December 2022 showed increase in size to 1.7 cm meeting FNA criteria.  She is s/p FNA of the isthmic nodule on 10/04/2021 with benign cytology ? ? ?She has fatigue  ?She has restless sleep at night due to back pain  ?Dees not snore  ?She has constipation  ?Denies local neck symptoms, but endorses dysphagia to certain pill and chewy food  ?Denies heartburn  ?She is on [facility] for weight loss  ? ? ?No FH of thyroid disease  ? ?She has 2 kids, 4 and 6 weeks  ?  ?No plans of conception  ?She is on MVI  ?Has hx of renal stones  ? ? ? ? ?HISTORY:  ?Past Medical History:  ?Past Medical History:  ?Diagnosis Date  ? Anxiety   ? BRCA negative 07/2021  ? MyRisk neg; IBIS=11.0%.riskscore=15.8%  ? Degenerative lumbar disc   ? Frequent headaches   ? Gestational thrombocytopenia ([place])   ? Lumbar herniated disc   ? Migraine with aura, intractable, with status migrainosus 1995  ? Oligomenorrhea   ? Postpartum depression   ? with first pregnancy  ? ?Past Surgical History:  ?Past Surgical History:  ?Procedure Laterality Date  ? [place] EXTRACTION  2012  ?  ?Social History:  reports that she quit smoking about 6 years ago. Her smoking use included cigarettes. She smoked an average of .25 packs per day. She has never used smokeless  tobacco. She reports current alcohol use of about 1.0 standard drink per week. She reports that she does not use drugs. ?Family History: family history includes Diabetes in her maternal aunt, maternal grandfather, maternal grandmother, mother, and sister; Heart disease in her maternal grandfather and maternal grandmother; Hypertension in her maternal grandfather and maternal grandmother; Kidney cancer in her maternal grandmother. ? ? ?HOME MEDICATIONS: ?Allergies as of 01/17/2022   ?No Known Allergies ?  ? ?  ?Medication List  ?  ? ?  ? Accurate as of Jan 17, 2022  7:15 AM. If you have any questions, ask your nurse or doctor.  ?  ?  ? ?  ? ?aspirin-acetaminophen-caffeine 250-250-65 MG tablet ?Commonly known as: [place] ?Take by mouth every 6 (six) hours as needed for headache. ?  ?buPROPion 300 MG 24 hr tablet ?Commonly known as: Wellbutrin XL ?Take 1 tablet (300 mg total) by mouth daily. ?  ?busPIRone 7.5 MG tablet ?Commonly known as: BUSPAR ?Take 1 tablet (7.5 mg total) by mouth 2 (two) times daily. ?  ?gabapentin 600 MG tablet ?Commonly known as: NEURONTIN ?Take 1/2  tablet (300 mg) by mouth at bedtime. ?(Take 0.5 tablets (300 mg total) by mouth at bedtime.) ?  ?hydrOXYzine 25 MG tablet ?Commonly known as: ATARAX ?Take 1 tablet (25 mg  total) by mouth every 6 (six) hours as needed for anxiety. ?  ?levonorgestrel 20 MCG/24HR IUD ?Commonly known as: MIRENA ?1 each by Intrauterine route once. ?  ?metroNIDAZOLE 500 MG tablet ?Commonly known as: FLAGYL ?Take 1 tablet (500 mg total) by mouth 2 (two) times daily. ?  ?tiZANidine Comfort Pac 4 MG Misc ?  ?traZODone 50 MG tablet ?Commonly known as: DESYREL ?Take 1 tablet (50 mg total) by mouth at bedtime as needed for sleep. ?  ?Wegovy 2.4 MG/0.75ML Soaj ?Generic drug: Semaglutide-Weight Management ?Inject 2.4 mg into the skin once a week. ?  ? ?  ?  ? ? ?REVIEW OF SYSTEMS: ?A comprehensive ROS was conducted with the patient and is negative except as per HPI and  below:  ?ROS  ? ? ? ?OBJECTIVE:  ?VS: There were no vitals taken for this visit.  ? ?Wt Readings from Last 3 Encounters:  ?11/21/21 185 lb (83.9 kg)  ?11/19/21 185 lb 3.2 oz (84 kg)  ?08/13/21 196 lb 12.8 oz (89.3 kg)  ? ? ? ?EXAM: ?General: Pt appears well and is in NAD  ?Neck: General: Supple without adenopathy. ?Thyroid: Thyroid nodule appreciated.   ?Lungs: Clear with good BS bilat with no rales, rhonchi, or wheezes  ?Heart: Auscultation: RRR.  ?Abdomen: Normoactive bowel sounds, soft, nontender, without masses or organomegaly palpable  ?Extremities:  ?BL LE: No pretibial edema normal ROM and strength.  ?Mental Status: Judgment, insight: Intact ?Orientation: Oriented to time, place, and person ?Mood and affect: No depression, anxiety, or agitation  ? ? ? ?DATA REVIEWED: ? Latest Reference Range & Units 01/17/22 11:26  ?TSH 0.35 - 5.50 uIU/mL 0.88  ?T4,Free(Direct) 0.60 - 1.60 ng/dL 0.87  ? ?  ? Latest Reference Range & Units 01/17/22 11:26  ?VITD 30.00 - 100.00 ng/mL 26.66 (L)  ? ? ? ? ?Thyroid ultrasound 08/26/2021 ?Estimated total number of nodules >/= 1 cm: 1 ?  ?Number of spongiform nodules >/=  2 cm not described below (TR1): 0 ?  ?Number of mixed cystic and solid nodules >/= 1.5 cm not described ?below ([place]): 0 ?  ?_________________________________________________________ ?  ?Nodule # 1: ?  ?Prior biopsy: No ?  ?Location: Isthmus; [place] ?  ?Maximum size: 1.7 cm; Other 2 dimensions: 1.4 x 1.1 cm, previously, ?1.0 x 0.7 x 0.5 cm ?  ?Composition: solid/almost completely solid (2) ?  ?Echogenicity: hypoechoic (2) ?  ?Shape: not taller-than-wide (0) ?  ?Margins: smooth (0) ?  ?Echogenic foci: none (0) ?  ?ACR TI-RADS total points: 4. ?  ?ACR TI-RADS risk category:  TR4 (4-6 points). ?  ?Significant change in size (>/= 20% in two dimensions and minimal ?increase of 2 mm): Yes ?  ?Change in features: Yes - nodule now appears hypoechoic, previously, ?isoechoic ?  ?Change in ACR TI-RADS risk category: Yes -  previously a TR3 nodule, ?presently a TR4 nodule ?  ?ACR TI-RADS recommendations: ?  ?**Given size (>/= 1.5 cm) and appearance, fine needle aspiration of ?this moderately suspicious nodule should be considered based on ?TI-RADS criteria. ?  ?_________________________________________________________ ?  ?Previously noted punctate (0.6 cm) anechoic cyst within the left ?lobe of the thyroid is not seen on the present examination. ?  ?IMPRESSION: ?Solitary isthmic nodule has increased in size and now appears ?hypoechoic and as such has been up graded from a TR3 nodule to a TR4 ?nodule and meets imaging criteria to recommend percutaneous sampling ?as indicated. ?  ? ? ?FNA isthmic nodule 10/04/2021 ?Specimen Submitted:  ?A.  Thyroid, midline isthmus nodule  ? ?Clinical History: None provided  ? ? ? ?DIAGNOSIS:  ?A. THYROID GLAND, MIDLINE ISTHMUS; ULTRASOUND-GUIDED FINE-NEEDLE  ?ASPIRATION:  ?- BENIGN ([place] CATEGORY II).  ? ? ? ? ?ASSESSMENT/PLAN/RECOMMENDATIONS:  ? ?Thyroid nodule: ? ? ?-Patient is biochemically euthyroid ?-No local neck symptoms ?-She is s/p FNA of isthmic nodule with benign cytology ?-We will repeat ultrasound in a year ? ? ? ?2.  History of renal stones ? ? ?-She was on vitamin D3 but developed renal stones, she is concerned that the vitamin D caused it ?-I did discuss the benefit of vitamin D supplementation, we also discussed the risk of hypocalcemia which will lead to renal stones with excessive vitamin D intake ?-But it does not appear that she was on excessive amounts ? ? ? ?3.  Fatigue: ? ?-Vitamin D slightly low we will replenish ?-Vitamin B12 normal ? ?Medications : ?Vitamin D3 800 IU daily ? ? ? ?Follow-up in 1 year ?Signed electronically by: ?Abby Nena Jordan, MD ? ?[facility]  ?[facility] ?[address] ?[place], [place] [address] ?Phone: [phone] ?FAX: [phone] ? ? ?CC: ?Venita Lick, NP ?[address] ?[place] [address] ?Phone:  [phone] ?Fax: [phone] ? ? ?Return to Endocrinology clinic as below: ?Future Appointments  ?Date Time Provider [place]  ?01/17/2022 10:50 AM Tyshia Fenter, Melanie Crazier, MD [facility] None  ?7/28/2

## 2022-01-18 LAB — PTH, INTACT AND CALCIUM
Calcium: 8.7 mg/dL (ref 8.6–10.2)
PTH: 32 pg/mL (ref 16–77)

## 2022-01-21 ENCOUNTER — Other Ambulatory Visit: Payer: Self-pay

## 2022-01-21 ENCOUNTER — Encounter: Payer: Self-pay | Admitting: Nurse Practitioner

## 2022-01-21 ENCOUNTER — Ambulatory Visit (INDEPENDENT_AMBULATORY_CARE_PROVIDER_SITE_OTHER): Payer: No Typology Code available for payment source | Admitting: Nurse Practitioner

## 2022-01-21 VITALS — BP 95/65 | HR 74 | Temp 97.9°F | Ht 68.0 in | Wt 173.0 lb

## 2022-01-21 DIAGNOSIS — E663 Overweight: Secondary | ICD-10-CM | POA: Diagnosis not present

## 2022-01-21 DIAGNOSIS — M25511 Pain in right shoulder: Secondary | ICD-10-CM | POA: Diagnosis not present

## 2022-01-21 DIAGNOSIS — K641 Second degree hemorrhoids: Secondary | ICD-10-CM | POA: Insufficient documentation

## 2022-01-21 DIAGNOSIS — K645 Perianal venous thrombosis: Secondary | ICD-10-CM

## 2022-01-21 DIAGNOSIS — K601 Chronic anal fissure: Secondary | ICD-10-CM

## 2022-01-21 MED ORDER — HYDROCORTISONE (PERIANAL) 2.5 % EX CREA
1.0000 "application " | TOPICAL_CREAM | Freq: Two times a day (BID) | CUTANEOUS | 0 refills | Status: AC
  Filled 2022-01-21: qty 28, 10d supply, fill #0

## 2022-01-21 MED ORDER — MELOXICAM 7.5 MG PO TABS
7.5000 mg | ORAL_TABLET | Freq: Every day | ORAL | 0 refills | Status: DC
  Filled 2022-01-21: qty 30, 30d supply, fill #0

## 2022-01-21 MED ORDER — LIDOCAINE HCL URETHRAL/MUCOSAL 2 % EX PRSY
1.0000 "application " | PREFILLED_SYRINGE | Freq: Three times a day (TID) | CUTANEOUS | 1 refills | Status: DC | PRN
  Filled 2022-01-21: qty 20, 7d supply, fill #0

## 2022-01-21 MED ORDER — NITROGLYCERIN 0.4 % RE OINT
TOPICAL_OINTMENT | RECTAL | 0 refills | Status: DC
  Filled 2022-01-21: qty 30, 7d supply, fill #0

## 2022-01-21 MED ORDER — METHOCARBAMOL 500 MG PO TABS
500.0000 mg | ORAL_TABLET | Freq: Three times a day (TID) | ORAL | 0 refills | Status: DC | PRN
  Filled 2022-01-21: qty 40, 14d supply, fill #0

## 2022-01-21 NOTE — Progress Notes (Signed)
BP 95/65   Pulse 74   Temp 97.9 F (36.6 C)   Ht '5\' 8"'$  (1.727 m)   Wt 173 lb (78.5 kg)   SpO2 98%   BMI 26.30 kg/m    Subjective:    Patient ID: Jennifer Morse, female    DOB: 01-Jul-1991, 31 y.o.   MRN: [id number]  HPI: Jennifer Morse is a 31 y.o. female  Chief Complaint  Patient presents with   Shoulder Pain    Patient is here to discuss shoulder pain in her R shoulder. 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 Patient states she has stayed out of the gym for a week and it feels like a pulled.    Hemorrhoids    Patient states she would like to discuss the next treatment options for a external hemorrhoids. Patient states she has tried to "doctor" on it on her own and tried everything possible. Patient states it bothers her to walk and stand.    WEIGHT CHECK: Continues on [place] for weight loss, currently 2.4 MG weekly.  Is tolerating well.  Currently down 23 pounds since December 2022.  Continues to tolerate [place] well and is working on exercise and diet regimen.    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  Heard a pop at that time.  Has been resting and not lifting heavy -- as pulls under arm. Duration: weeks Involved shoulder: right Mechanism of injury:  holding up leg on L&D Location: posterior Onset:sudden Severity: 6/10  Quality:  pulling and throbbing Frequency: intermittent Radiation: yes Aggravating factors: sleep  Alleviating factors: nothing  Status: fluctuating Treatments attempted:  ice, heat Epsom salt, Ibuprofen  Relief with NSAIDs?:  mild Weakness: no Numbness: no Decreased grip strength: no Redness: no Swelling: no Bruising: no Fevers: no   HEMORRHOIDS Has had issues with hemorrhoids since giving birth to daughter, were large.  They did decrease in size for  period.  Went to Dr. Bary Castilla, who did not want to perform surgery -- GI provider did perform banding, which did not fix it completely.  Currently has a flared one, has been doctoring at home -- very painful. Duration: weeks Anal fullness: yes Perianal itching/irritation: no Perianal pain: yes Bright red rectal bleeding: no Amount of blood: severe Frequency:  none Constipation: no Hard stools: no Chronic straining/valsava: no Alleviating factors: nothing Aggravating factors: bowel movement Status: worse Treatments attempted: OTC medicated wipes and hydrocortisone cream  Previous hemorrhoids: yes  Colonoscopy: no.  Relevant past medical, surgical, family and social history reviewed and updated as indicated. Interim medical history since our last visit reviewed. Allergies and medications reviewed and updated.  Review of Systems  Constitutional:  Negative for activity change, appetite change, diaphoresis, fatigue and fever.  Respiratory:  Negative for cough, chest tightness and shortness of breath.   Cardiovascular:  Negative for chest pain, palpitations and leg swelling.  Gastrointestinal:  Positive for rectal pain. Negative for anal bleeding, blood in stool, constipation, diarrhea and vomiting.  Musculoskeletal:  Positive for arthralgias.  Neurological: Negative.   Psychiatric/Behavioral: Negative.     Per HPI unless specifically indicated above     Objective:    BP 95/65   Pulse 74   Temp 97.9 F (36.6 C)   Ht '5\' 8"'$  (1.727 m)  Wt 173 lb (78.5 kg)   SpO2 98%   BMI 26.30 kg/m   Wt Readings from Last 3 Encounters:  01/21/22 173 lb (78.5 kg)  01/17/22 176 lb (79.8 kg)  11/21/21 185 lb (83.9 kg)    Physical Exam Vitals and nursing note reviewed. Exam conducted with a chaperone present.  Constitutional:      General: She is awake. She is not in acute distress.    Appearance: She is well-developed and well-groomed. She is not ill-appearing or toxic-appearing.  HENT:      Head: Normocephalic.     Right Ear: Hearing normal.     Left Ear: Hearing normal.  Eyes:     General: Lids are normal.        Right eye: No discharge.        Left eye: No discharge.     Conjunctiva/sclera: Conjunctivae normal.     Pupils: Pupils are equal, round, and reactive to light.  Neck:     Thyroid: No thyromegaly.     Vascular: No carotid bruit.  Cardiovascular:     Rate and Rhythm: Normal rate and regular rhythm.     Heart sounds: Normal heart sounds. No murmur heard.   No gallop.  Pulmonary:     Effort: Pulmonary effort is normal. No accessory muscle usage or respiratory distress.     Breath sounds: Normal breath sounds.  Abdominal:     General: Bowel sounds are normal.     Palpations: Abdomen is soft. There is no hepatomegaly or splenomegaly.  Genitourinary:    Rectum: Tenderness and external hemorrhoid (thrombosed and tender to approx 7 o'clock position) present. No mass.  Musculoskeletal:     Right shoulder: Tenderness (to posterior aspect along lower rotator cuff) present. No swelling, laceration, bony tenderness or crepitus. Decreased range of motion. Decreased strength. Normal pulse.     Left shoulder: Normal.     Cervical back: Normal range of motion and neck supple.     Right lower leg: No edema.     Left lower leg: No edema.     Comments: Positive Neer and Hawkins testing present.  Skin:    General: Skin is warm and dry.  Neurological:     Mental Status: She is alert and oriented to person, place, and time.     Deep Tendon Reflexes: Reflexes are normal and symmetric.     Reflex Scores:      Brachioradialis reflexes are 2+ on the right side and 2+ on the left side.      Patellar reflexes are 2+ on the right side and 2+ on the left side. Psychiatric:        Attention and Perception: Attention normal.        Mood and Affect: Mood normal.        Speech: Speech normal.        Behavior: Behavior normal. Behavior is cooperative.        Thought Content: Thought  content normal.    Results for orders placed or performed in visit on 01/17/22  PTH, intact and calcium  Result Value Ref Range   PTH 32 16 - 77 pg/mL   Calcium 8.7 8.6 - 10.2 mg/dL  Vitamin B12  Result Value Ref Range   Vitamin B-12 305 211 - 911 pg/mL  VITAMIN D 25 Hydroxy (Vit-D Deficiency, Fractures)  Result Value Ref Range   VITD 26.66 (L) 30.00 - 100.00 ng/mL  TSH  Result Value Ref Range   TSH  0.88 0.35 - 5.50 uIU/mL  T4, free  Result Value Ref Range   Free T4 0.87 0.60 - 1.60 ng/dL      Assessment & Plan:   Problem List Items Addressed This Visit       Cardiovascular and Mediastinum   External thrombosed hemorrhoids    Chronic issue with current acute flare.  Will refill Anusol and Lidocaine gel.  She is in significant discomfort, referral to general surgery to discuss alternate options as had banding in past without benefit.       Relevant Orders   Ambulatory referral to General Surgery     Digestive   Chronic anal fissure - Primary    Currently with thrombosed external hemorrhoid, will get her into general surgery for assessment.       Relevant Orders   Ambulatory referral to General Surgery     Other   Acute pain of right shoulder    Acute since injury holding patient leg while pushing on L&D.  Suspect impingement and possible rotator cuff irritation.  Does have decreased mobility and strength.  Will get her into ortho for further evaluation and start Robaxin as needed + Meloxicam for pain. Recommend continue to rest and gently stretch to avoid frozen shoulder + alternate ice and heat to area.  Voltaren gel as needed.         Relevant Orders   Ambulatory referral to Orthopedics   Overweight (BMI 25.0-29.9)    BMI 26.30.  Has lost 23 pounds thus far with Wegovy.  Will continue 2.4 MG weekly dosing as is tolerating and noting benefit with this.  Recommended eating smaller high protein, low fat meals more frequently and exercising 30 mins a day 5 times a  week with a goal of 10-15lb weight loss in the next 3 months. Patient voiced their understanding and motivation to adhere to these recommendations.         Follow up plan: Return in about 4 weeks (around 02/18/2022) for Hemorrhoids and shoulder pain.

## 2022-01-21 NOTE — Assessment & Plan Note (Signed)
BMI 26.30.  Has lost 23 pounds thus far with Wegovy.  Will continue 2.4 MG weekly dosing as is tolerating and noting benefit with this.  Recommended eating smaller high protein, low fat meals more frequently and exercising 30 mins a day 5 times a week with a goal of 10-15lb weight loss in the next 3 months. Patient voiced their understanding and motivation to adhere to these recommendations.

## 2022-01-21 NOTE — Assessment & Plan Note (Signed)
Acute since injury holding patient leg while pushing on L&D.  Suspect impingement and possible rotator cuff irritation.  Does have decreased mobility and strength.  Will get her into ortho for further evaluation and start Robaxin as needed + Meloxicam for pain. Recommend continue to rest and gently stretch to avoid frozen shoulder + alternate ice and heat to area.  Voltaren gel as needed.

## 2022-01-21 NOTE — Patient Instructions (Signed)
Hemorrhoids Hemorrhoids are swollen veins that may develop: In the butt (rectum). These are called internal hemorrhoids. Around the opening of the butt (anus). These are called external hemorrhoids. Hemorrhoids can cause pain, itching, or bleeding. Most of the time, they do not cause serious problems. They usually get better with diet changes, lifestyle changes, and other home treatments. What are the causes? This condition may be caused by: Having trouble pooping (constipation). Pushing hard (straining) to poop. Watery poop (diarrhea). Pregnancy. Being very overweight (obese). Sitting for long periods of time. Heavy lifting or other activity that causes you to strain. Anal sex. Riding a bike for a long period of time. What are the signs or symptoms? Symptoms of this condition include: Pain. Itching or soreness in the butt. Bleeding from the butt. Leaking poop. Swelling in the area. One or more lumps around the opening of your butt. How is this diagnosed? A doctor can often diagnose this condition by looking at the affected area. The doctor may also: Do an exam that involves feeling the area with a gloved hand (digital rectal exam). Examine the area inside your butt using a small tube (anoscope). Order blood tests. This may be done if you have lost a lot of blood. Have you get a test that involves looking inside the colon using a flexible tube with a camera on the end (sigmoidoscopy or colonoscopy). How is this treated? This condition can usually be treated at home. Your doctor may tell you to change what you eat, make lifestyle changes, or try home treatments. If these do not help, procedures can be done to remove the hemorrhoids or make them smaller. These may involve: Placing rubber bands at the base of the hemorrhoids to cut off their blood supply. Injecting medicine into the hemorrhoids to shrink them. Shining a type of light energy onto the hemorrhoids to cause them to fall  off. Doing surgery to remove the hemorrhoids or cut off their blood supply. Follow these instructions at home: Eating and drinking  Eat foods that have a lot of fiber in them. These include whole grains, beans, nuts, fruits, and vegetables. Ask your doctor about taking products that have added fiber (fibersupplements). Reduce the amount of fat in your diet. You can do this by: Eating low-fat dairy products. Eating less red meat. Avoiding processed foods. Drink enough fluid to keep your pee (urine) pale yellow. Managing pain and swelling  Take a warm-water bath (sitz bath) for 20 minutes to ease pain. Do this 3-4 times a day. You may do this in a bathtub or using a portable sitz bath that fits over the toilet. If told, put ice on the painful area. It may be helpful to use ice between your warm baths. Put ice in a plastic bag. Place a towel between your skin and the bag. Leave the ice on for 20 minutes, 2-3 times a day. General instructions Take over-the-counter and prescription medicines only as told by your doctor. Medicated creams and medicines may be used as told. Exercise often. Ask your doctor how much and what kind of exercise is best for you. Go to the bathroom when you have the urge to poop. Do not wait. Avoid pushing too hard when you poop. Keep your butt dry and clean. Use wet toilet paper or moist towelettes after pooping. Do not sit on the toilet for a long time. Keep all follow-up visits as told by your doctor. This is important. Contact a doctor if you: Have pain and   swelling that do not get better with treatment or medicine. Have trouble pooping. Cannot poop. Have pain or swelling outside the area of the hemorrhoids. Get help right away if you have: Bleeding that will not stop. Summary Hemorrhoids are swollen veins in the butt or around the opening of the butt. They can cause pain, itching, or bleeding. Eat foods that have a lot of fiber in them. These include  whole grains, beans, nuts, fruits, and vegetables. Take a warm-water bath (sitz bath) for 20 minutes to ease pain. Do this 3-4 times a day. This information is not intended to replace advice given to you by your health care provider. Make sure you discuss any questions you have with your health care provider. Document Revised: 03/03/2021 Document Reviewed: 03/03/2021 Elsevier Patient Education  2023 Elsevier Inc.  

## 2022-01-21 NOTE — Assessment & Plan Note (Signed)
Chronic issue with current acute flare.  Will refill Anusol and Lidocaine gel.  She is in significant discomfort, referral to general surgery to discuss alternate options as had banding in past without benefit.

## 2022-01-21 NOTE — Assessment & Plan Note (Signed)
Currently with thrombosed external hemorrhoid, will get her into general surgery for assessment.

## 2022-01-25 ENCOUNTER — Other Ambulatory Visit: Payer: Self-pay

## 2022-02-03 ENCOUNTER — Ambulatory Visit: Payer: No Typology Code available for payment source | Admitting: Surgery

## 2022-02-03 ENCOUNTER — Encounter: Payer: Self-pay | Admitting: Surgery

## 2022-02-03 VITALS — BP 103/72 | HR 74 | Temp 98.0°F | Ht 69.0 in | Wt 166.4 lb

## 2022-02-03 DIAGNOSIS — K5900 Constipation, unspecified: Secondary | ICD-10-CM | POA: Diagnosis not present

## 2022-02-03 DIAGNOSIS — K601 Chronic anal fissure: Secondary | ICD-10-CM

## 2022-02-03 DIAGNOSIS — K645 Perianal venous thrombosis: Secondary | ICD-10-CM | POA: Diagnosis not present

## 2022-02-03 DIAGNOSIS — K641 Second degree hemorrhoids: Secondary | ICD-10-CM

## 2022-02-03 NOTE — Patient Instructions (Addendum)
Advised to pursue a goal of 25 to 30 g of fiber daily.  Made aware that the majority of this may be through natural sources, but advised to be aware of actual consumption and to ensure minimal consumption by daily supplementation.  Various forms of supplements discussed. Recommended Whole Psyllium husk, that mixes well with applesauce, or the powder which goes down well shaken with chocolate milk.  Strongly advised to consume more fluids to ensure adequate hydration, instructed to watch color of urine to determine adequacy of hydration. Clarity is pursued in urine output, and bowel activity that correlates to significant meal intake.   We need to avoid deferring having bowel movements, advised to take the time at the first sign of sensation, typically following meals, and in the morning.   Subsequent utilization of MiraLAX may be needed ensure at least daily movement, ideally twice daily bowel movements.  If multiple doses of MiraLAX are necessary utilize them. Never skip a day...  To be regular, we must do the above EVERY day.   Continue to do sitz baths to help with healing. It may take a couple more weeks for healing.   We will have you follow up here in 1 month.

## 2022-02-03 NOTE — Progress Notes (Signed)
Patient ID: Jennifer Morse, female   DOB: 1991/01/13, 31 y.o.   MRN: [id number]  Chief Complaint: Hemorrhoids/anal pain.  History of Present Illness Jennifer Morse is a 31 y.o. female with a longer than 8-year history of anal issues/anal pain.  History of anal fissure, history of banding of internal hemorrhoids.  Unfortunately she had a bad experience with the banding.  Seems most of her issues began with her last pregnancy where she had large external hemorrhoids present.  A lot of this has improved, she used to have significant constipation, is currently utilizing the [facility] and has her bowel movements every other day which have been easy to move and she is taking a stool softener to help with that.  She has had prior experience with fiber capsules, but did not seem to make an impact on her bowel activity.  Despite actually doing well without straining, with every other day movements she still developed a recent thrombosis about 2 weeks ago.  This was enucleated at another facility and she did have some improvement of her pain from a 15 to a 7.  She has seen blood, bright red blood with her stools.  Her issue is primarily pain and has had no success using the topicals, of which she has had many.  She had made use of sitz bath's.  She denies any family history of colon cancer and has had no colonoscopy. Though she does like to work out at [employer], she does avoid marked weightbearing. She uses a mirror to evaluate the region.  She reports observing the turning inside out of the distal rectum and anal process during bowel activity.  She does report that the insides immediately return internally, without any lingering or need to manually reduce them.  Past Medical History Past Medical History:  Diagnosis Date   Anxiety    BRCA negative 07/2021   MyRisk neg; IBIS=11.0%.riskscore=15.8%   Degenerative lumbar disc    Frequent headaches    Gestational thrombocytopenia (HCC)    Lumbar herniated disc     Migraine with aura, intractable, with status migrainosus 1995   Oligomenorrhea    Postpartum depression    with first pregnancy      Past Surgical History:  Procedure Laterality Date   WISDOM TOOTH EXTRACTION  2012    No Known Allergies  Current Outpatient Medications  Medication Sig Dispense Refill   aspirin-acetaminophen-caffeine (EXCEDRIN MIGRAINE) 250-250-65 MG tablet Take by mouth every 6 (six) hours as needed for headache.     buPROPion (WELLBUTRIN XL) 300 MG 24 hr tablet Take 1 tablet (300 mg total) by mouth daily. 90 tablet 4   busPIRone (BUSPAR) 7.5 MG tablet Take 1 tablet (7.5 mg total) by mouth 2 (two) times daily. 180 tablet 4   gabapentin (NEURONTIN) 600 MG tablet Take 0.5 tablets (300 mg total) by mouth at bedtime. 45 tablet 4   hydrocortisone (ANUSOL-HC) 2.5 % rectal cream Place 1 application. rectally 2 (two) times daily. 28 g 0   hydrOXYzine (ATARAX/VISTARIL) 25 MG tablet Take 1 tablet (25 mg total) by mouth every 6 (six) hours as needed for anxiety. 30 tablet 2   levonorgestrel (MIRENA) 20 MCG/24HR IUD 1 each by Intrauterine route once.     meloxicam (MOBIC) 7.5 MG tablet Take 1 tablet (7.5 mg total) by mouth daily. 30 tablet 0   methocarbamol (ROBAXIN) 500 MG tablet Take 1 tablet (500 mg total) by mouth every 8 (eight) hours as needed for muscle spasms. 40 tablet  0   Semaglutide-Weight Management (WEGOVY) 2.4 MG/0.75ML SOAJ Inject 2.4 mg into the skin once a week. 3 mL 4   traZODone (DESYREL) 50 MG tablet Take 1 tablet (50 mg total) by mouth at bedtime as needed for sleep. 30 tablet 2   No current facility-administered medications for this visit.    Family History Family History  Problem Relation Age of Onset   Diabetes Mother    Diabetes Sister    Diabetes Maternal Grandmother    Heart disease Maternal Grandmother    Hypertension Maternal Grandmother    Kidney cancer Maternal Grandmother    Diabetes Maternal Grandfather    Heart disease Maternal  Grandfather    Hypertension Maternal Grandfather    Diabetes Maternal Aunt    Breast cancer Neg Hx    Ovarian cancer Neg Hx    Colon cancer Neg Hx       Social History Social History   Tobacco Use   Smoking status: Former    Packs/day: 0.25    Types: Cigarettes    Quit date: 02/26/2015    Years since quitting: 6.9   Smokeless tobacco: Never  Vaping Use   Vaping Use: Never used  Substance Use Topics   Alcohol use: Yes    Alcohol/week: 1.0 standard drink    Types: 1 Glasses of wine per week   Drug use: No      Review of Systems  Constitutional: Negative.   HENT: Negative.    Eyes: Negative.   Respiratory: Negative.    Cardiovascular: Negative.   Gastrointestinal:  Positive for blood in stool.  Genitourinary: Negative.   Skin: Negative.   Neurological: Negative.   Psychiatric/Behavioral: Negative.       Physical Exam Blood pressure 103/72, pulse 74, temperature 98 F (36.7 C), height _0  (1.753 m), weight 166 lb 6.4 oz (75.5 kg), SpO2 99 %. Last Weight  Most recent update: 02/03/2022  9:29 AM    Weight  75.5 kg (166 lb 6.4 oz)             CONSTITUTIONAL: Well developed, and nourished, appropriately responsive and aware without distress.   EYES: Sclera non-icteric.   EARS, NOSE, MOUTH AND THROAT:  The oropharynx is clear. Oral mucosa is pink and moist.  Hearing is intact to voice.  NECK: Trachea is midline, and there is no jugular venous distension.  LYMPH NODES:  Lymph nodes in the neck are not enlarged. RESPIRATORY:  Lungs are clear, and breath sounds are equal bilaterally. Normal respiratory effort without pathologic use of accessory muscles. CARDIOVASCULAR: Heart is regular in rate and rhythm. GI: The abdomen is soft, nontender, and nondistended. There were no palpable masses. I did not appreciate hepatosplenomegaly. There were normal bowel sounds. GU: [name] is present as chaperone.  There is a residual 5 mm external hemorrhoid at 4:00 on the patient's  anterior right, there is a small opening present there consistent with the recent enucleation site.  It is tender to touch, but without any evidence of adjacent inflammation or induration or fluctuance.  There is no other remarkable external hemorrhoidal pathology present.  On initial examination I reached the anal verge only to feel a transition of a very hypertrophied internal sphincter.  There may be a great degree of voluntary anal spasm present with initiation of the exam.  There appears to be circumferential internal hemorrhoids which take up additional intraluminal space.  Although she has named 1 internal hemorrhoid, I was not able to appreciate it.  MUSCULOSKELETAL:  Symmetrical muscle tone appreciated in all four extremities.    SKIN: Skin turgor is normal. No pathologic skin lesions appreciated.  NEUROLOGIC:  Motor and sensation appear grossly normal.  Cranial nerves are grossly without defect. PSYCH:  Alert and oriented to person, place and time. Affect is appropriate for situation.  Data Reviewed I have personally reviewed what is currently available of the patient's imaging, recent labs and medical records.   Labs:     Latest Ref Rng & Units 11/21/2021   10:04 AM 08/13/2021    9:15 AM 11/27/2020    4:15 PM  CBC  WBC 4.0 - 10.5 K/uL 4.4   4.6   5.5    Hemoglobin 12.0 - 15.0 g/dL 14.2   14.1   13.0    Hematocrit 36.0 - 46.0 % 41.7   42.0   39.1    Platelets 150 - 400 K/uL 150   153   146        Latest Ref Rng & Units 01/17/2022   11:26 AM 11/21/2021   10:04 AM 08/13/2021    9:15 AM  CMP  Glucose 70 - 99 mg/dL  82   79    BUN 6 - 20 mg/dL  12   15    Creatinine 0.44 - 1.00 mg/dL  0.90   0.88    Sodium 135 - 145 mmol/L  137   142    Potassium 3.5 - 5.1 mmol/L  3.8   4.3    Chloride 98 - 111 mmol/L  104   104    CO2 22 - 32 mmol/L  25   22    Calcium 8.6 - 10.2 mg/dL 8.7   9.2   9.6    Total Protein 6.5 - 8.1 g/dL  7.9   7.1    Total Bilirubin 0.3 - 1.2 mg/dL  0.8   0.2     Alkaline Phos 38 - 126 U/L  49   69    AST 15 - 41 U/L  16   14    ALT 0 - 44 U/L  14   13        Imaging:  Within last 24 hrs: No results found.  Assessment    Recent thrombosed external hemorrhoid.  Status post enucleation. History of anal fissure, and internal hemorrhoidal banding. Though much improved, significant history of constipation. 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  Made aware that the majority of this may be through natural sources, but advised to be aware of actual consumption and to ensure minimal consumption by daily supplementation.  Various forms of supplements discussed.  Recommended Psyllium husk, that mixes well with applesauce, or the powder which goes down well shaken with chocolate milk.  Strongly advised to consume more fluids to ensure adequate hydration, instructed to watch color of urine to determine adequacy of hydration.  Clarity is pursued in urine  output, and bowel activity that correlates to significant meal intake.   We need to avoid deferring having bowel movements, advised to take the time at the first sign of sensation, typically following meals, and in the morning.   Subsequent utilization of MiraLAX may be needed ensure at least daily movement, ideally twice daily bowel movements.  If multiple doses of MiraLAX are necessary utilize them. Never skip a day...  To be regular, we must do the above  EVERY day.    Face-to-face time spent with the patient and accompanying care providers(if present) was 40 minutes, with more than 50% of the time spent counseling, educating, and coordinating care of the patient.    These notes generated with voice recognition software. I apologize for typographical errors.  Ronny Bacon M.D., FACS 02/03/2022, 10:22 AM

## 2022-02-07 ENCOUNTER — Other Ambulatory Visit: Payer: Self-pay

## 2022-02-08 ENCOUNTER — Other Ambulatory Visit: Payer: Self-pay

## 2022-02-09 ENCOUNTER — Other Ambulatory Visit: Payer: Self-pay

## 2022-02-20 NOTE — Patient Instructions (Incomplete)
Hemorrhoids Hemorrhoids are swollen veins that may develop: In the butt (rectum). These are called internal hemorrhoids. Around the opening of the butt (anus). These are called external hemorrhoids. Hemorrhoids can cause pain, itching, or bleeding. Most of the time, they do not cause serious problems. They usually get better with diet changes, lifestyle changes, and other home treatments. What are the causes? This condition may be caused by: Having trouble pooping (constipation). Pushing hard (straining) to poop. Watery poop (diarrhea). Pregnancy. Being very overweight (obese). Sitting for long periods of time. Heavy lifting or other activity that causes you to strain. Anal sex. Riding a bike for a long period of time. What are the signs or symptoms? Symptoms of this condition include: Pain. Itching or soreness in the butt. Bleeding from the butt. Leaking poop. Swelling in the area. One or more lumps around the opening of your butt. How is this diagnosed? A doctor can often diagnose this condition by looking at the affected area. The doctor may also: Do an exam that involves feeling the area with a gloved hand (digital rectal exam). Examine the area inside your butt using a small tube (anoscope). Order blood tests. This may be done if you have lost a lot of blood. Have you get a test that involves looking inside the colon using a flexible tube with a camera on the end (sigmoidoscopy or colonoscopy). How is this treated? This condition can usually be treated at home. Your doctor may tell you to change what you eat, make lifestyle changes, or try home treatments. If these do not help, procedures can be done to remove the hemorrhoids or make them smaller. These may involve: Placing rubber bands at the base of the hemorrhoids to cut off their blood supply. Injecting medicine into the hemorrhoids to shrink them. Shining a type of light energy onto the hemorrhoids to cause them to fall  off. Doing surgery to remove the hemorrhoids or cut off their blood supply. Follow these instructions at home: Eating and drinking  Eat foods that have a lot of fiber in them. These include whole grains, beans, nuts, fruits, and vegetables. Ask your doctor about taking products that have added fiber (fibersupplements). Reduce the amount of fat in your diet. You can do this by: Eating low-fat dairy products. Eating less red meat. Avoiding processed foods. Drink enough fluid to keep your pee (urine) pale yellow. Managing pain and swelling  Take a warm-water bath (sitz bath) for 20 minutes to ease pain. Do this 3-4 times a day. You may do this in a bathtub or using a portable sitz bath that fits over the toilet. If told, put ice on the painful area. It may be helpful to use ice between your warm baths. Put ice in a plastic bag. Place a towel between your skin and the bag. Leave the ice on for 20 minutes, 2-3 times a day. General instructions Take over-the-counter and prescription medicines only as told by your doctor. Medicated creams and medicines may be used as told. Exercise often. Ask your doctor how much and what kind of exercise is best for you. Go to the bathroom when you have the urge to poop. Do not wait. Avoid pushing too hard when you poop. Keep your butt dry and clean. Use wet toilet paper or moist towelettes after pooping. Do not sit on the toilet for a long time. Keep all follow-up visits as told by your doctor. This is important. Contact a doctor if you: Have pain and   swelling that do not get better with treatment or medicine. Have trouble pooping. Cannot poop. Have pain or swelling outside the area of the hemorrhoids. Get help right away if you have: Bleeding that will not stop. Summary Hemorrhoids are swollen veins in the butt or around the opening of the butt. They can cause pain, itching, or bleeding. Eat foods that have a lot of fiber in them. These include  whole grains, beans, nuts, fruits, and vegetables. Take a warm-water bath (sitz bath) for 20 minutes to ease pain. Do this 3-4 times a day. This information is not intended to replace advice given to you by your health care provider. Make sure you discuss any questions you have with your health care provider. Document Revised: 03/03/2021 Document Reviewed: 03/03/2021 Elsevier Patient Education  2023 Elsevier Inc.  

## 2022-02-25 ENCOUNTER — Ambulatory Visit (INDEPENDENT_AMBULATORY_CARE_PROVIDER_SITE_OTHER): Payer: No Typology Code available for payment source | Admitting: Nurse Practitioner

## 2022-02-25 ENCOUNTER — Encounter: Payer: Self-pay | Admitting: Nurse Practitioner

## 2022-02-25 VITALS — BP 92/62 | HR 61 | Temp 97.7°F | Ht 69.0 in | Wt 164.8 lb

## 2022-02-25 DIAGNOSIS — K645 Perianal venous thrombosis: Secondary | ICD-10-CM | POA: Diagnosis not present

## 2022-02-25 DIAGNOSIS — M25511 Pain in right shoulder: Secondary | ICD-10-CM | POA: Diagnosis not present

## 2022-02-25 NOTE — Progress Notes (Signed)
BP 92/62   Pulse 61   Temp 97.7 F (36.5 C) (Oral)   Ht 5\' 9"  (1.753 m)   Wt 164 lb 12.8 oz (74.8 kg)   SpO2 99%   BMI 24.34 kg/m    Subjective:    Patient ID: Jennifer Morse, female    DOB: Oct 01, 1990, 31 y.o.   MRN: [id number]  HPI: Jennifer Morse is a 31 y.o. female  Chief Complaint  Patient presents with   Hemorrhoids    Patient states the hemorrhoids is better and says she was in the ER that night. Patient says the area was cut and the blood clot was released. Patient says she was told that she needed to eat more fiber before they would consider surgery.    Shoulder Pain    Patient states she was referral to PT at [facility] and says she would not get in until August. Patient says she had a referral sent to [facility] and says she has yet to hear from them and she says they just sent referral during this week. Patient says she is still experiencing the discomfort no worse or better.    SHOULDER PAIN Follow-up for right shoulder, Labor and Delivery RN, was helping hold patient leg while pushing on 01/04/22, heard a pop at that time.  Has seen [facility], they have placed a referral for PT -- she is waiting to hear on this.  Continues to have discomfort -- not better or worse.  [facility] wants her to do 3 weeks of PT and then order MRI if no improvement. Duration: weeks Involved shoulder: right Mechanism of injury:  holding up leg on L&D Location: posterior Onset:sudden Severity: 4-5/10 Quality:  pulling and throbbing Frequency: intermittent Radiation: radiates towards deltoid Aggravating factors: sleep , movement or stretch wrong way Alleviating factors: nothing  Status: fluctuating Treatments attempted:  ice, heat, Epsom salt, Ibuprofen  Relief with NSAIDs?:  mild Weakness: no Numbness: no Decreased grip strength: no Redness: no Swelling: no Bruising: no Fevers: no   HEMORRHOIDS Follow-up for hemorrhoids, went to ER and they lanced it.  Saw  general surgery on 02/03/22 -- no procedures done.  They recommended she increased fiber with goal of bowel movement two times a day without straining, she is following up with them upcoming. Duration: weeks Anal fullness: no Perianal itching/irritation: no Perianal pain: yes Bright red rectal bleeding: no Amount of blood: none Frequency:  none Constipation: no Hard stools: no Chronic straining/valsava: no Alleviating factors: lancing of hemorrhoid Aggravating factors: bowel movement Status: worse Treatments attempted: OTC medicated wipes and hydrocortisone cream , lanced in ER setting Previous hemorrhoids: yes  Colonoscopy: no.  Relevant past medical, surgical, family and social history reviewed and updated as indicated. Interim medical history since our last visit reviewed. Allergies and medications reviewed and updated.  Review of Systems  Constitutional:  Negative for activity change, appetite change, diaphoresis, fatigue and fever.  Respiratory:  Negative for cough, chest tightness and shortness of breath.   Cardiovascular:  Negative for chest pain, palpitations and leg swelling.  Gastrointestinal:  Negative for anal bleeding, blood in stool, constipation, diarrhea, rectal pain and vomiting.  Musculoskeletal:  Positive for arthralgias.  Neurological: Negative.   Psychiatric/Behavioral: Negative.      Per HPI unless specifically indicated above     Objective:    BP 92/62   Pulse 61   Temp 97.7 F (36.5 C) (Oral)   Ht 5\' 9"  (1.753 m)   Wt  164 lb 12.8 oz (74.8 kg)   SpO2 99%   BMI 24.34 kg/m   Wt Readings from Last 3 Encounters:  02/25/22 164 lb 12.8 oz (74.8 kg)  02/03/22 166 lb 6.4 oz (75.5 kg)  01/21/22 173 lb (78.5 kg)    Physical Exam Vitals and nursing note reviewed.  Constitutional:      General: She is awake. She is not in acute distress.    Appearance: She is well-developed and well-groomed. She is not ill-appearing or toxic-appearing.  HENT:     Head:  Normocephalic.     Right Ear: Hearing normal.     Left Ear: Hearing normal.  Eyes:     General: Lids are normal.        Right eye: No discharge.        Left eye: No discharge.     Conjunctiva/sclera: Conjunctivae normal.     Pupils: Pupils are equal, round, and reactive to light.  Neck:     Thyroid: No thyromegaly.     Vascular: No carotid bruit.  Cardiovascular:     Rate and Rhythm: Normal rate and regular rhythm.     Heart sounds: Normal heart sounds. No murmur heard.    No gallop.  Pulmonary:     Effort: Pulmonary effort is normal. No accessory muscle usage or respiratory distress.     Breath sounds: Normal breath sounds.  Abdominal:     General: Bowel sounds are normal.     Palpations: Abdomen is soft.  Musculoskeletal:     Cervical back: Normal range of motion and neck supple.     Right lower leg: No edema.     Left lower leg: No edema.  Skin:    General: Skin is warm and dry.  Neurological:     Mental Status: She is alert and oriented to person, place, and time.     Deep Tendon Reflexes: Reflexes are normal and symmetric.     Reflex Scores:      Brachioradialis reflexes are 2+ on the right side and 2+ on the left side.      Patellar reflexes are 2+ on the right side and 2+ on the left side. Psychiatric:        Attention and Perception: Attention normal.        Mood and Affect: Mood normal.        Speech: Speech normal.        Behavior: Behavior normal. Behavior is cooperative.        Thought Content: Thought content normal.    Results for orders placed or performed in visit on 01/17/22  PTH, intact and calcium  Result Value Ref Range   PTH 32 16 - 77 pg/mL   Calcium 8.7 8.6 - 10.2 mg/dL  Vitamin L87  Result Value Ref Range   Vitamin B-12 305 211 - 911 pg/mL  VITAMIN D 25 Hydroxy (Vit-D Deficiency, Fractures)  Result Value Ref Range   VITD 26.66 (L) 30.00 - 100.00 ng/mL  TSH  Result Value Ref Range   TSH 0.88 0.35 - 5.50 uIU/mL  T4, free  Result Value Ref  Range   Free T4 0.87 0.60 - 1.60 ng/dL      Assessment & Plan:   Problem List Items Addressed This Visit       Cardiovascular and Mediastinum   External thrombosed hemorrhoids    Improved at this time, continue focus on diet changes and ensure plenty of fiber daily.  Will continue collaboration with  general surgery.        Other   Acute pain of right shoulder - Primary    Acute, stable -- no worsening.  Followed by [facility] at this time, continue this collaboration, is to start PT upcoming.        Follow up plan: Return in about 3 months (around 05/25/2022) for MOOD AND WEIGHT CHECK.

## 2022-02-25 NOTE — Assessment & Plan Note (Signed)
Acute, stable -- no worsening.  Followed by [facility] at this time, continue this collaboration, is to start PT upcoming.

## 2022-03-03 ENCOUNTER — Ambulatory Visit: Payer: No Typology Code available for payment source | Admitting: Surgery

## 2022-03-03 ENCOUNTER — Other Ambulatory Visit: Payer: Self-pay

## 2022-03-03 ENCOUNTER — Encounter: Payer: Self-pay | Admitting: Surgery

## 2022-03-03 VITALS — BP 104/70 | HR 56 | Temp 98.0°F | Ht 68.0 in | Wt 161.0 lb

## 2022-03-03 DIAGNOSIS — K5900 Constipation, unspecified: Secondary | ICD-10-CM | POA: Diagnosis not present

## 2022-03-03 DIAGNOSIS — K641 Second degree hemorrhoids: Secondary | ICD-10-CM | POA: Diagnosis not present

## 2022-03-03 NOTE — Progress Notes (Signed)
Patient ID: Jennifer Morse, female   DOB: [birth year], 31 y.o.   MRN: [id number]  Chief Complaint: Hemorrhoids/anal pain.  History of Present Illness Overall she is done better with the attempts at increasing her fiber intake through fiber supplementation.  She is found to many fiber products intolerable.  I have encouraged her to add soluble fiber through Gummies, and other methods to get the fiber into her system.  It seems the MiraLAX has been helpful, but she only uses it as needed to maintain a daily movement.  I believe it still wise to aim for twice daily. Previously Jennifer Morse is a 31 y.o. female with a longer than 8-year history of anal issues/anal pain.  History of anal fissure, history of banding of internal hemorrhoids.  Unfortunately she had a bad experience with the banding.  Seems most of her issues began with her last pregnancy where she had large external hemorrhoids present.  A lot of this has improved, she used to have significant constipation, is currently utilizing the [facility] and has her bowel movements every other day which have been easy to move and she is taking a stool softener to help with that.  She has had prior experience with fiber capsules, but did not seem to make an impact on her bowel activity.  Despite actually doing well without straining, with every other day movements she still developed a recent thrombosis about 2 weeks ago.  This was enucleated at another facility and she did have some improvement of her pain from a 15 to a 7.  She has seen blood, bright red blood with her stools.  Her issue is primarily pain and has had no success using the topicals, of which she has had many.  She had made use of sitz bath's.  She denies any family history of colon cancer and has had no colonoscopy. Though she does like to work out at [employer], she does avoid marked weightbearing. She uses a mirror to evaluate the region.  She reports observing the turning inside out of the  distal rectum and anal process during bowel activity.  She does report that the insides immediately return internally, without any lingering or need to manually reduce them.  Past Medical History Past Medical History:  Diagnosis Date   Anxiety    BRCA negative 07/2021   MyRisk neg; IBIS=11.0%.riskscore=15.8%   Degenerative lumbar disc    Frequent headaches    Gestational thrombocytopenia (HCC)    Lumbar herniated disc    Migraine with aura, intractable, with status migrainosus 1995   Oligomenorrhea    Postpartum depression    with first pregnancy      Past Surgical History:  Procedure Laterality Date   WISDOM TOOTH EXTRACTION  2012    No Known Allergies  Current Outpatient Medications  Medication Sig Dispense Refill   aspirin-acetaminophen-caffeine (EXCEDRIN MIGRAINE) 250-250-65 MG tablet Take by mouth every 6 (six) hours as needed for headache.     buPROPion (WELLBUTRIN XL) 300 MG 24 hr tablet Take 1 tablet (300 mg total) by mouth daily. 90 tablet 4   busPIRone (BUSPAR) 7.5 MG tablet Take 1 tablet (7.5 mg total) by mouth 2 (two) times daily. 180 tablet 4   gabapentin (NEURONTIN) 600 MG tablet Take 0.5 tablets (300 mg total) by mouth at bedtime. 45 tablet 4   hydrocortisone (ANUSOL-HC) 2.5 % rectal cream Place 1 application. rectally 2 (two) times daily. 28 g 0   hydrOXYzine (ATARAX/VISTARIL) 25 MG tablet Take  1 tablet (25 mg total) by mouth every 6 (six) hours as needed for anxiety. 30 tablet 2   levonorgestrel (MIRENA) 20 MCG/24HR IUD 1 each by Intrauterine route once.     meloxicam (MOBIC) 7.5 MG tablet Take 1 tablet (7.5 mg total) by mouth daily. 30 tablet 0   methocarbamol (ROBAXIN) 500 MG tablet Take 1 tablet (500 mg total) by mouth every 8 (eight) hours as needed for muscle spasms. 40 tablet 0   Semaglutide-Weight Management (WEGOVY) 2.4 MG/0.75ML SOAJ Inject 2.4 mg into the skin once a week. 3 mL 4   traZODone (DESYREL) 50 MG tablet Take 1 tablet (50 mg total) by mouth  at bedtime as needed for sleep. 30 tablet 2   No current facility-administered medications for this visit.    Family History Family History  Problem Relation Age of Onset   Diabetes Mother    Diabetes Sister    Diabetes Maternal Grandmother    Heart disease Maternal Grandmother    Hypertension Maternal Grandmother    Kidney cancer Maternal Grandmother    Diabetes Maternal Grandfather    Heart disease Maternal Grandfather    Hypertension Maternal Grandfather    Diabetes Maternal Aunt    Breast cancer Neg Hx    Ovarian cancer Neg Hx    Colon cancer Neg Hx       Social History Social History   Tobacco Use   Smoking status: Former    Packs/day: 0.25    Types: Cigarettes    Quit date: 02/26/2015    Years since quitting: 7.0   Smokeless tobacco: Never  Vaping Use   Vaping Use: Never used  Substance Use Topics   Alcohol use: Yes    Alcohol/week: 1.0 standard drink of alcohol    Types: 1 Glasses of wine per week   Drug use: No      Review of Systems  Constitutional: Negative.   HENT: Negative.    Eyes: Negative.   Respiratory: Negative.    Cardiovascular: Negative.   Gastrointestinal:  Positive for blood in stool.  Genitourinary: Negative.   Skin: Negative.   Neurological: Negative.   Psychiatric/Behavioral: Negative.        Physical Exam Blood pressure 104/70, pulse (!) 56, temperature 98 F (36.7 C), temperature source Oral, height 5' 8" (1.727 m), weight 161 lb (73 kg), SpO2 99 %. Last Weight  Most recent update: 03/03/2022  8:31 AM    Weight  73 kg (161 lb)             CONSTITUTIONAL: Well developed, and nourished, appropriately responsive and aware without distress.   EYES: Sclera non-icteric.   EARS, NOSE, MOUTH AND THROAT:  The oropharynx is clear. Oral mucosa is pink and moist.  Hearing is intact to voice.  NECK: Trachea is midline, and there is no jugular venous distension.  LYMPH NODES:  Lymph nodes in the neck are not enlarged. RESPIRATORY:   Lungs are clear, and breath sounds are equal bilaterally. Normal respiratory effort without pathologic use of accessory muscles. CARDIOVASCULAR: Heart is regular in rate and rhythm. GI: The abdomen is soft, nontender, and nondistended. There were no palpable masses. I did not appreciate hepatosplenomegaly. There were normal bowel sounds. GU: Freda Munro is present as chaperone.  There no remarkable residual external hemorrhoids present, despite her prior enucleation of an external thrombosed hemorrhoid.  On initial examination I reached the anal verge only to feel a transition of a very hypertrophied internal sphincter.  There may be a  great degree of voluntary anal spasm present with initiation of the exam.  There appears to be circumferential internal hemorrhoids which take up additional intraluminal space.  There were no distinct piles or redundancy aside from a fluid-filled ring. MUSCULOSKELETAL:  Symmetrical muscle tone appreciated in all four extremities.    SKIN: Skin turgor is normal. No pathologic skin lesions appreciated.  NEUROLOGIC:  Motor and sensation appear grossly normal.  Cranial nerves are grossly without defect. PSYCH:  Alert and oriented to person, place and time. Affect is appropriate for situation.  Data Reviewed I have personally reviewed what is currently available of the patient's imaging, recent labs and medical records.   Labs:     Latest Ref Rng & Units 11/21/2021   10:04 AM 08/13/2021    9:15 AM 11/27/2020    4:15 PM  CBC  WBC 4.0 - 10.5 K/uL 4.4  4.6  5.5   Hemoglobin 12.0 - 15.0 g/dL 14.2  14.1  13.0   Hematocrit 36.0 - 46.0 % 41.7  42.0  39.1   Platelets 150 - 400 K/uL 150  153  146       Latest Ref Rng & Units 01/17/2022   11:26 AM 11/21/2021   10:04 AM 08/13/2021    9:15 AM  CMP  Glucose 70 - 99 mg/dL  82  79   BUN 6 - 20 mg/dL  12  15   Creatinine 0.44 - 1.00 mg/dL  0.90  0.88   Sodium 135 - 145 mmol/L  137  142   Potassium 3.5 - 5.1 mmol/L  3.8  4.3    Chloride 98 - 111 mmol/L  104  104   CO2 22 - 32 mmol/L  25  22   Calcium 8.6 - 10.2 mg/dL 8.7  9.2  9.6   Total Protein 6.5 - 8.1 g/dL  7.9  7.1   Total Bilirubin 0.3 - 1.2 mg/dL  0.8  0.2   Alkaline Phos 38 - 126 U/L  49  69   AST 15 - 41 U/L  16  14   ALT 0 - 44 U/L  14  13       Imaging:  Within last 24 hrs: No results found.  Assessment    Recent thrombosed external hemorrhoid.  Status post enucleation.  No residual external hemorrhoid on today's exam. History of anal fissure, and internal hemorrhoidal banding. Though much improved, significant history of constipation. 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  Though she has not found something acceptable at this time, utilization of soluble fiber Gummies may be a satisfactory alternative.  I encouraged her that surgical options have there are downsides.  And I believe a fear of regression is the biggest motivator to wanting something more definitive done at this time.  We will remain focused on regulating her bowel activity and have her return in 3 months time.  We did discuss there may be alternative hemorrhoidal procedures that may be available through colorectal specialist. Previously: Advised to pursue a goal of 25 to 30 g of fiber daily.  Made aware that the majority of this may be through natural sources, but advised to  be aware of actual consumption and to ensure minimal consumption by daily supplementation.  Various forms of supplements discussed.  Recommended Psyllium husk, that mixes well with applesauce, or the powder which goes down well shaken with chocolate milk.  Strongly advised to consume more fluids to ensure adequate hydration, instructed to watch color of urine to determine adequacy of hydration.  Clarity is pursued in urine output, and bowel activity that correlates to significant meal intake.   We need to avoid deferring having bowel movements, advised to take the time at the first sign of sensation, typically following meals, and in the morning.   Subsequent utilization of MiraLAX may be needed ensure at least daily movement, ideally twice daily bowel movements.  If multiple doses of MiraLAX are necessary utilize them. Never skip a day...  To be regular, we must do the above EVERY day.    Face-to-face time spent with the patient and accompanying care providers(if present) was 25 minutes, with more than 50% of the time spent counseling, educating, and coordinating care of the patient.    These notes generated with voice recognition software. I apologize for typographical errors.  Ronny Bacon M.D., FACS 03/03/2022, 10:18 AM

## 2022-03-03 NOTE — Patient Instructions (Addendum)
Please call with any questions or concerns.  Continue with the Fiber. Aim for more frequent bowel movements daily.   Fiber Content in Foods Fiber is a substance that is found in plant foods, such as fruits, vegetables, whole grains, nuts, seeds, and beans. As part of your treatment and recovery plan, your health care provider may recommend that you eat foods that have specific amounts of dietary fiber. Some conditions may require a high-fiber diet while others may require a low-fiber diet. This sheet gives you information about the dietary fiber content of some common foods. Your health care provider will tell you how much fiber you need in your diet. If you have problems or questions, contact your health care provider or dietitian. What foods are high in fiber?  Fruits Blackberries or raspberries (fresh) --  cup (75 g) has 4 g of fiber. Pear (fresh) -- 1 medium (180 g) has 5.5 g of fiber. Prunes (dried) -- 6 to 8 pieces (57-76 g) has 5 g of fiber. Apple with skin -- 1 medium (182 g) has 4.8 g of fiber. Guava -- 1 cup (128 g) has 8.9 g of fiber. Vegetables Peas (frozen) --  cup (80 g) has 4.4 g of fiber. Potato with skin (baked) -- 1 medium (173 g) has 4.4 g of fiber. Pumpkin (canned) --  cup (122 g) has 5 g of fiber. Brussels sprouts (cooked) --  cup (78 g) has 4 g of fiber. Sweet potato --  cup mashed (124 g) has 4 g of fiber. Winter squash -- 1 cup cooked (205 g) has 5.7 g of fiber. Grains Bran cereal --  cup (31 g) has 8.6 g of fiber. Bulgur (cooked) --  cup (70 g) has 4 g of fiber. Quinoa (cooked) -- 1 cup (185 g) has 5.2 g of fiber. Popcorn -- 3 cups (375 g) popped has 5.8 g of fiber. Spaghetti, whole wheat -- 1 cup (140 g) has 6 g of fiber. Meats and other proteins Pinto beans (cooked) --  cup (90 g) has 7.7 g of fiber. Lentils (cooked) --  cup (90 g) has 7.8 g of fiber. Kidney beans (canned) --  cup (92.5 g) has 5.7 g of fiber. Soybeans (canned, frozen, or fresh) --   cup (92.5 g) has 5.2 g of fiber. Baked beans, plain or vegetarian (canned) --  cup (130 g) has 5.2 g of fiber. Garbanzo beans or chickpeas (canned) --  cup (90 g) has 6.6 g of fiber. Black beans (cooked) --  cup (86 g) has 7.5 g of fiber. White beans or navy beans (cooked) --  cup (91 g) has 9.3 g of fiber. The items listed above may not be a complete list of foods with high fiber. Actual amounts of fiber may be different depending on processing. Contact a dietitian for more information. What foods are moderate in fiber?  Fruits Banana -- 1 medium (126 g) has 3.2 g of fiber. Melon -- 1 cup (155 g) has 1.4 g of fiber. Orange -- 1 small (154 g) has 3.7 g of fiber. Raisins --  cup (40 g) has 1.8 g of fiber. Applesauce, sweetened --  cup (125 g) has 1.5 g of fiber. Blueberries (fresh) --  cup (75 g) has 1.8 g of fiber. Strawberries (fresh, sliced) -- 1 cup (161 g) has 3 g of fiber. Cherries -- 1 cup (140 g) has 2.9 g of fiber. Vegetables Broccoli (cooked) --  cup (77.5 g) has 2.1 g of fiber.  Carrots (cooked) --  cup (77.5 g) has 2.2 g of fiber. Corn (canned or frozen) --  cup (82.5 g) has 2.1 g of fiber. Potatoes, mashed --  cup (105 g) has 1.6 g of fiber. Tomato -- 1 medium (62 g) has 1.5 g of fiber. Green beans (canned) --  cup (83 g) has 2 g of fiber. Squash, winter --  cup (58 g) has 1 g of fiber. Sweet potato, baked -- 1 medium (150 g) has 3 g of fiber. Cauliflower (cooked) -- 1/2 cup (90 g) has 2.3 g of fiber. Grains Long-grain brown rice (cooked) -- 1 cup (196 g) has 3.5 g of fiber. Bagel, plain -- one 4-inch (10 cm) bagel has 2 g of fiber. Instant oatmeal --  cup (120 g) has about 2 g of fiber. Macaroni noodles, enriched (cooked) -- 1 cup (140 g) has 2.5 g of fiber. Multigrain cereal --  cup (15 g) has about 2-4 g of fiber. Whole-wheat bread -- 1 slice (26 g) has 2 g of fiber. Whole-wheat spaghetti noodles --  cup (70 g) has 3.2 g of fiber. Corn tortilla --  one 6-inch (15 cm) tortilla has 1.5 g of fiber. Meats and other proteins Almonds --  cup or 1 oz (28 g) has 3.5 g of fiber. Sunflower seeds in shell --  cup or  oz (11.5 g) has 1.1 g of fiber. Vegetable or soy patty -- 1 patty (70 g) has 3.4 g of fiber. Walnuts --  cup or 1 oz (30 g) has 2 g of fiber. Flax seed -- 1 Tbsp (7 g) has 2.8 g of fiber. The items listed above may not be a complete list of foods that have moderate amounts of fiber. Actual amounts of fiber may be different depending on processing. Contact a dietitian for more information. What foods are low in fiber?  Low-fiber foods contain less than 1 g of fiber per serving. They include: Fruits Fruit juice --  cup or 4 fl oz (118 mL) has 0.5 g of fiber. Vegetables Lettuce -- 1 cup (35 g) has 0.5 g of fiber. Cucumber (slices) --  cup (60 g) has 0.3 g of fiber. Celery -- 1 stalk (40 g) has 0.1 g of fiber. Grains Flour tortilla -- one 6-inch (15 cm) tortilla has 0.5 g of fiber. White rice (cooked) --  cup (81.5 g) has 0.3 g of fiber. Meats and other proteins Egg -- 1 large (50 g) has 0 g of fiber. Meat, poultry, or fish -- 3 oz (85 g) has 0 g of fiber. Dairy Milk -- 1 cup or 8 fl oz (237 mL) has 0 g of fiber. Yogurt -- 1 cup (245 g) has 0 g of fiber. The items listed above may not be a complete list of foods that are low in fiber. Actual amounts of fiber may be different depending on processing. Contact a dietitian for more information. Summary Fiber is a substance that is found in plant foods, such as fruits, vegetables, whole grains, nuts, seeds, and beans. As part of your treatment and recovery plan, your health care provider may recommend that you eat foods that have specific amounts of dietary fiber. This information is not intended to replace advice given to you by your health care provider. Make sure you discuss any questions you have with your health care provider. Document Revised: 12/26/2019 Document Reviewed:  12/26/2019 Elsevier Patient Education  [place].   Hemorrhoids Hemorrhoids are swollen veins in  and around the rectum or anus. There are two types of hemorrhoids: Internal hemorrhoids. These occur in the veins that are just inside the rectum. They may poke through to the outside and become irritated and painful. External hemorrhoids. These occur in the veins that are outside the anus and can be felt as a painful swelling or hard lump near the anus. Most hemorrhoids do not cause serious problems, and they can be managed with home treatments such as diet and lifestyle changes. If home treatments do not help the symptoms, procedures can be done to shrink or remove the hemorrhoids. What are the causes? This condition is caused by increased pressure in the anal area. This pressure may result from various things, including: Constipation. Straining to have a bowel movement. Diarrhea. Pregnancy. Obesity. Sitting for long periods of time. Heavy lifting or other activity that causes you to strain. Anal sex. Riding a bike for a long period of time. What are the signs or symptoms? Symptoms of this condition include: Pain. Anal itching or irritation. Rectal bleeding. Leakage of stool (feces). Anal swelling. One or more lumps around the anus. How is this diagnosed? This condition can often be diagnosed through a visual exam. Other exams or tests may also be done, such as: An exam that involves feeling the rectal area with a gloved hand (digital rectal exam). An exam of the anal canal that is done using a small tube (anoscope). A blood test, if you have lost a significant amount of blood. A test to look inside the colon using a flexible tube with a camera on the end (sigmoidoscopy or colonoscopy). How is this treated? This condition can usually be treated at home. However, various procedures may be done if dietary changes, lifestyle changes, and other home treatments do not help your  symptoms. These procedures can help make the hemorrhoids smaller or remove them completely. Some of these procedures involve surgery, and others do not. Common procedures include: Rubber band ligation. Rubber bands are placed at the base of the hemorrhoids to cut off their blood supply. Sclerotherapy. Medicine is injected into the hemorrhoids to shrink them. Infrared coagulation. A type of light energy is used to get rid of the hemorrhoids. Hemorrhoidectomy surgery. The hemorrhoids are surgically removed, and the veins that supply them are tied off. Stapled hemorrhoidopexy surgery. The surgeon staples the base of the hemorrhoid to the rectal wall. Follow these instructions at home: Eating and drinking  Eat foods that have a lot of fiber in them, such as whole grains, beans, nuts, fruits, and vegetables. Ask your health care provider about taking products that have added fiber (fiber supplements). Reduce the amount of fat in your diet. You can do this by eating low-fat dairy products, eating less red meat, and avoiding processed foods. Drink enough fluid to keep your urine pale yellow. Managing pain and swelling  Take warm sitz baths for 20 minutes, 3-4 times a day to ease pain and discomfort. You may do this in a bathtub or using a portable sitz bath that fits over the toilet. If directed, apply ice to the affected area. Using ice packs between sitz baths may be helpful. Put ice in a plastic bag. Place a towel between your skin and the bag. Leave the ice on for 20 minutes, 2-3 times a day. General instructions Take over-the-counter and prescription medicines only as told by your health care provider. Use medicated creams or suppositories as told. Get regular exercise. Ask your health care provider how  much and what kind of exercise is best for you. In general, you should do moderate exercise for at least 30 minutes on most days of the week (150 minutes each week). This can include activities  such as walking, biking, or yoga. Go to the bathroom when you have the urge to have a bowel movement. Do not wait. Avoid straining to have bowel movements. Keep the anal area dry and clean. Use wet toilet paper or moist towelettes after a bowel movement. Do not sit on the toilet for long periods of time. This increases blood pooling and pain. Keep all follow-up visits as told by your health care provider. This is important. Contact a health care provider if you have: Increasing pain and swelling that are not controlled by treatment or medicine. Difficulty having a bowel movement, or you are unable to have a bowel movement. Pain or inflammation outside the area of the hemorrhoids. Get help right away if you have: Uncontrolled bleeding from your rectum. Summary Hemorrhoids are swollen veins in and around the rectum or anus. Most hemorrhoids can be managed with home treatments such as diet and lifestyle changes. Taking warm sitz baths can help ease pain and discomfort. In severe cases, procedures or surgery can be done to shrink or remove the hemorrhoids. This information is not intended to replace advice given to you by your health care provider. Make sure you discuss any questions you have with your health care provider. Document Revised: 03/03/2021 Document Reviewed: 03/03/2021 Elsevier Patient Education  [place].

## 2022-03-10 ENCOUNTER — Other Ambulatory Visit: Payer: Self-pay

## 2022-04-01 ENCOUNTER — Ambulatory Visit: Payer: No Typology Code available for payment source

## 2022-04-05 ENCOUNTER — Other Ambulatory Visit: Payer: Self-pay

## 2022-04-14 ENCOUNTER — Other Ambulatory Visit: Payer: Self-pay

## 2022-04-20 ENCOUNTER — Other Ambulatory Visit: Payer: Self-pay

## 2022-04-20 MED ORDER — ALPRAZOLAM 0.5 MG PO TABS
ORAL_TABLET | ORAL | 0 refills | Status: DC
  Filled 2022-04-20: qty 30, 15d supply, fill #0

## 2022-04-21 ENCOUNTER — Other Ambulatory Visit: Payer: Self-pay

## 2022-05-22 NOTE — Patient Instructions (Signed)

## 2022-05-23 ENCOUNTER — Ambulatory Visit: Payer: No Typology Code available for payment source | Admitting: Nurse Practitioner

## 2022-05-24 ENCOUNTER — Ambulatory Visit: Payer: No Typology Code available for payment source | Admitting: Surgery

## 2022-05-24 ENCOUNTER — Encounter: Payer: Self-pay | Admitting: Surgery

## 2022-05-24 VITALS — BP 110/73 | HR 67 | Temp 98.0°F | Wt 147.8 lb

## 2022-05-24 DIAGNOSIS — K594 Anal spasm: Secondary | ICD-10-CM | POA: Diagnosis not present

## 2022-05-24 DIAGNOSIS — K601 Chronic anal fissure: Secondary | ICD-10-CM

## 2022-05-24 NOTE — Progress Notes (Signed)
She reports she is doing well with the exception of some pressure pain that seems to occur after her bowel movements.  She reports her bowel movements are daily, without straining, without spending time on the commode.  She denies any blood, is using all forms of medication aside from witch hazel topically.  She reports taking gummy fibers and a fiber rich diet.  She does not want to take more aggressive nonsoluble fiber.  She does not feel that she will be able to move her bowels more frequently, and we discussed perhaps adding a separated time of a nonsoluble fiber intake. She does not want an exam today, says she does not want her hemorrhoids exacerbated prior to her anticipated travel later this week.  She does not want to address this any further until she returns from her cruise.  Previously on her second visit Overall she is done better with the attempts at increasing her fiber intake through fiber supplementation.  She is found to many fiber products intolerable.  I have encouraged her to add soluble fiber through Gummies, and other methods to get the fiber into her system.  It seems the MiraLAX has been helpful, but she only uses it as needed to maintain a daily movement.  I believe it still wise to aim for twice daily. Previously on our first visit Jennifer Morse is a 31 y.o. female with a longer than 8-year history of anal issues/anal pain.  History of anal fissure, history of banding of internal hemorrhoids.  Unfortunately she had a bad experience with the banding.  Seems most of her issues began with her last pregnancy where she had large external hemorrhoids present.  A lot of this has improved, she used to have significant constipation, is currently utilizing the [facility] and has her bowel movements every other day which have been easy to move and she is taking a stool softener to help with that.  She has had prior experience with fiber capsules, but did not seem to make an impact on her bowel  activity.  Despite actually doing well without straining, with every other day movements she still developed a recent thrombosis about 2 weeks ago.  This was enucleated at another facility and she did have some improvement of her pain from a 15 to a 7.  She has seen blood, bright red blood with her stools.  Her issue is primarily pain and has had no success using the topicals, of which she has had many.  She had made use of sitz bath's.  She denies any family history of colon cancer and has had no colonoscopy. Though she does like to work out at [employer], she does avoid marked weightbearing. She uses a mirror to evaluate the region.  She reports observing the turning inside out of the distal rectum and anal process during bowel activity.  She does report that the insides immediately return internally, without any lingering or need to manually reduce them.   Exam deferred.  Jennifer Morse present as chaperone.  We discussed the readiness to see her when she returns and wants to pursue further evaluation.  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  We will evaluate this further on her next visit/exam.

## 2022-05-24 NOTE — Patient Instructions (Addendum)
If you have any concerns or questions, please feel free to call our office.   Hemorrhoids Hemorrhoids are swollen veins that may develop: In the butt (rectum). These are called internal hemorrhoids. Around the opening of the butt (anus). These are called external hemorrhoids. Hemorrhoids can cause pain, itching, or bleeding. Most of the time, they do not cause serious problems. They usually get better with diet changes, lifestyle changes, and other home treatments. What are the causes? This condition may be caused by: Having trouble pooping (constipation). Pushing hard (straining) to poop. Watery poop (diarrhea). Pregnancy. Being very overweight (obese). Sitting for long periods of time. Heavy lifting or other activity that causes you to strain. Anal sex. Riding a bike for a long period of time. What are the signs or symptoms? Symptoms of this condition include: Pain. Itching or soreness in the butt. Bleeding from the butt. Leaking poop. Swelling in the area. One or more lumps around the opening of your butt. How is this diagnosed? A doctor can often diagnose this condition by looking at the affected area. The doctor may also: Do an exam that involves feeling the area with a gloved hand (digital rectal exam). Examine the area inside your butt using a small tube (anoscope). Order blood tests. This may be done if you have lost a lot of blood. Have you get a test that involves looking inside the colon using a flexible tube with a camera on the end (sigmoidoscopy or colonoscopy). How is this treated? This condition can usually be treated at home. Your doctor may tell you to change what you eat, make lifestyle changes, or try home treatments. If these do not help, procedures can be done to remove the hemorrhoids or make them smaller. These may involve: Placing rubber bands at the base of the hemorrhoids to cut off their blood supply. Injecting medicine into the hemorrhoids to shrink  them. Shining a type of light energy onto the hemorrhoids to cause them to fall off. Doing surgery to remove the hemorrhoids or cut off their blood supply. Follow these instructions at home: Eating and drinking  Eat foods that have a lot of fiber in them. These include whole grains, beans, nuts, fruits, and vegetables. Ask your doctor about taking products that have added fiber (fibersupplements). Reduce the amount of fat in your diet. You can do this by: Eating low-fat dairy products. Eating less red meat. Avoiding processed foods. Drink enough fluid to keep your pee (urine) pale yellow. Managing pain and swelling  Take a warm-water bath (sitz bath) for 20 minutes to ease pain. Do this 3-4 times a day. You may do this in a bathtub or using a portable sitz bath that fits over the toilet. If told, put ice on the painful area. It may be helpful to use ice between your warm baths. Put ice in a plastic bag. Place a towel between your skin and the bag. Leave the ice on for 20 minutes, 2-3 times a day. General instructions Take over-the-counter and prescription medicines only as told by your doctor. Medicated creams and medicines may be used as told. Exercise often. Ask your doctor how much and what kind of exercise is best for you. Go to the bathroom when you have the urge to poop. Do not wait. Avoid pushing too hard when you poop. Keep your butt dry and clean. Use wet toilet paper or moist towelettes after pooping. Do not sit on the toilet for a long time. Keep all follow-up visits  as told by your doctor. This is important. Contact a doctor if you: Have pain and swelling that do not get better with treatment or medicine. Have trouble pooping. Cannot poop. Have pain or swelling outside the area of the hemorrhoids. Get help right away if you have: Bleeding that will not stop. Summary Hemorrhoids are swollen veins in the butt or around the opening of the butt. They can cause pain,  itching, or bleeding. Eat foods that have a lot of fiber in them. These include whole grains, beans, nuts, fruits, and vegetables. Take a warm-water bath (sitz bath) for 20 minutes to ease pain. Do this 3-4 times a day. This information is not intended to replace advice given to you by your health care provider. Make sure you discuss any questions you have with your health care provider. Document Revised: 03/03/2021 Document Reviewed: 03/03/2021 Elsevier Patient Education  [place].

## 2022-05-25 ENCOUNTER — Ambulatory Visit: Payer: No Typology Code available for payment source | Admitting: Nurse Practitioner

## 2022-05-27 ENCOUNTER — Encounter: Payer: Self-pay | Admitting: Nurse Practitioner

## 2022-05-27 ENCOUNTER — Ambulatory Visit (INDEPENDENT_AMBULATORY_CARE_PROVIDER_SITE_OTHER): Payer: No Typology Code available for payment source | Admitting: Nurse Practitioner

## 2022-05-27 ENCOUNTER — Other Ambulatory Visit: Payer: Self-pay

## 2022-05-27 VITALS — BP 92/59 | HR 68 | Temp 98.0°F | Ht 68.0 in | Wt 145.6 lb

## 2022-05-27 DIAGNOSIS — F419 Anxiety disorder, unspecified: Secondary | ICD-10-CM | POA: Diagnosis not present

## 2022-05-27 DIAGNOSIS — E663 Overweight: Secondary | ICD-10-CM

## 2022-05-27 DIAGNOSIS — F32A Depression, unspecified: Secondary | ICD-10-CM

## 2022-05-27 MED ORDER — WEGOVY 1.7 MG/0.75ML ~~LOC~~ SOAJ
1.7000 mg | SUBCUTANEOUS | 5 refills | Status: DC
  Filled 2022-05-27: qty 3, 28d supply, fill #0
  Filled 2022-07-07: qty 3, 28d supply, fill #1
  Filled 2022-07-27: qty 3, 28d supply, fill #2
  Filled 2022-08-30: qty 3, 28d supply, fill #3
  Filled 2022-09-30: qty 3, 28d supply, fill #4
  Filled 2022-11-02: qty 3, 28d supply, fill #5

## 2022-05-27 NOTE — Assessment & Plan Note (Signed)
BMI today 22.14 -- she has reached goal on Wegovy and lost total of 51 lbs.  We will initiate reducing dosing to a maintenance dose, start with reduction to 1.7 MG. Discussed with patient and she is agreeable to this change.  Plan on weight recheck January.

## 2022-05-27 NOTE — Assessment & Plan Note (Signed)
Chronic, exacerbated at this time but stable.  Denies SI/HI.  Currently her mother has ovarian cancer and recently found out husband cheated on her.  Will maintain Wellbutrin and Buspar + Vistaril.  Trazodone as needed for sleep + Xanax as ordered by GYN.  Monitor mood closely and adjust regimen as needed.

## 2022-05-27 NOTE — Progress Notes (Signed)
BP (!) 92/59   Pulse 68   Temp 98 F (36.7 C) (Oral)   Ht '5\' 8"'$  (1.727 m)   Wt 145 lb 9.6 oz (66 kg)   SpO2 98%   BMI 22.14 kg/m    Subjective:    Patient ID: Jennifer Morse, female    DOB: 1991/02/18, 31 y.o.   MRN: [id number]  HPI: Jennifer Morse is a 31 y.o. female  Chief Complaint  Patient presents with   Weight Check    Patient is here    Mood    Patient says her mood could be better, as she recently going through something personal at home. Husband was caught in an affair. Patient says she is not sure any medication at this time can help what she is going through.    WEIGHT CHECK: Continues on [place] for weight loss, currently 2.4 MG weekly.  Is tolerating well.  Currently down 51 pounds since December 2022, was 196 lbs. Continues to tolerate [place] well and is working on exercise and diet regimen.     ANXIETY/STRESS Her mom was diagnosed with Stage 3 ovarian cancer in September 2022. Patient had genetic testing done which was negative.  Currently increased stressors as found out her husband is having an affair.  She is currently doing individual therapy and joint counseling + a group at church.   Currently taking Wellbutrin and Buspar == PRN Trazodone and Vistaril.   Duration:stable Anxious mood: yes Excessive worrying: yes Irritability: occasional with her husband Sweating: no Nausea: no Palpitations:no Hyperventilation: no Panic attacks: yes -- was provided Xanax by OB/GYN Agoraphobia: no  Obscessions/compulsions: no Depressed mood: yes    05/27/2022   10:01 AM 02/25/2022    4:22 PM 01/21/2022   10:47 AM 11/19/2021   10:23 AM 08/13/2021    8:34 AM  Depression screen PHQ 2/9  Decreased Interest 0 0 0 1 1  Down, Depressed, Hopeless 1 0 0 0 1  PHQ - 2 Score 1 0 0 1 2  Altered sleeping 1 0 1 0 1  Tired, decreased energy '1 1 2 1 1  '$ Change in appetite 0 0 0 0 1  Feeling bad or failure about yourself  1 0 0 1 1  Trouble concentrating 1 0 0 0 0   Moving slowly or fidgety/restless 0 0 0 0 0  Suicidal thoughts 0 0 0 0 0  PHQ-9 Score '5 1 3 3 6  '$ Difficult doing work/chores Somewhat difficult Not difficult at all   Somewhat difficult       05/27/2022   10:01 AM 02/25/2022    4:22 PM 01/21/2022   10:48 AM 11/19/2021   10:24 AM  GAD 7 : Generalized Anxiety Score  Nervous, Anxious, on Edge '1 1 1 1  '$ Control/stop worrying 2 0 0 0  Worry too much - different things '2 1 1 1  '$ Trouble relaxing 1 0 0 0  Restless 0 0 0 0  Easily annoyed or irritable 1 1 0 1  Afraid - awful might happen 1 0 0 0  Total GAD 7 Score '8 3 2 3  '$ Anxiety Difficulty Somewhat difficult Not difficult at all Not difficult at all Not difficult at all   Relevant past medical, surgical, family and social history reviewed and updated as indicated. Interim medical history since our last visit reviewed. Allergies and medications reviewed and updated.  Review of Systems  Constitutional:  Negative for activity change, appetite change, diaphoresis, fatigue and fever.  Respiratory:  Negative for cough, chest tightness and shortness of breath.   Cardiovascular:  Negative for chest pain, palpitations and leg swelling.  Gastrointestinal: Negative.   Endocrine: Negative.   Neurological: Negative.   Psychiatric/Behavioral: Negative.     Per HPI unless specifically indicated above     Objective:    BP (!) 92/59   Pulse 68   Temp 98 F (36.7 C) (Oral)   Ht '5\' 8"'$  (1.727 m)   Wt 145 lb 9.6 oz (66 kg)   SpO2 98%   BMI 22.14 kg/m   Wt Readings from Last 3 Encounters:  05/27/22 145 lb 9.6 oz (66 kg)  05/24/22 147 lb 12.8 oz (67 kg)  03/03/22 161 lb (73 kg)    Physical Exam Vitals and nursing note reviewed.  Constitutional:      General: She is awake. She is not in acute distress.    Appearance: She is well-developed, well-groomed and overweight. She is not ill-appearing or toxic-appearing.  HENT:     Head: Normocephalic.     Right Ear: Hearing normal.     Left Ear:  Hearing normal.  Eyes:     General: Lids are normal.        Right eye: No discharge.        Left eye: No discharge.     Conjunctiva/sclera: Conjunctivae normal.     Pupils: Pupils are equal, round, and reactive to light.  Neck:     Thyroid: No thyromegaly.     Vascular: No carotid bruit.  Cardiovascular:     Rate and Rhythm: Normal rate and regular rhythm.     Heart sounds: Normal heart sounds. No murmur heard.    No gallop.  Pulmonary:     Effort: Pulmonary effort is normal. No accessory muscle usage or respiratory distress.     Breath sounds: Normal breath sounds.  Abdominal:     General: Bowel sounds are normal.     Palpations: Abdomen is soft. There is no hepatomegaly or splenomegaly.  Musculoskeletal:     Cervical back: Normal range of motion and neck supple.     Right lower leg: No edema.     Left lower leg: No edema.  Lymphadenopathy:     Cervical: No cervical adenopathy.  Skin:    General: Skin is warm and dry.  Neurological:     Mental Status: She is alert and oriented to person, place, and time.     Deep Tendon Reflexes: Reflexes are normal and symmetric.     Reflex Scores:      Brachioradialis reflexes are 2+ on the right side and 2+ on the left side.      Patellar reflexes are 2+ on the right side and 2+ on the left side. Psychiatric:        Attention and Perception: Attention normal.        Mood and Affect: Mood normal.        Speech: Speech normal.        Behavior: Behavior normal. Behavior is cooperative.        Thought Content: Thought content normal.    Results for orders placed or performed in visit on 01/17/22  PTH, intact and calcium  Result Value Ref Range   PTH 32 16 - 77 pg/mL   Calcium 8.7 8.6 - 10.2 mg/dL  Vitamin B12  Result Value Ref Range   Vitamin B-12 305 211 - 911 pg/mL  VITAMIN D 25 Hydroxy (Vit-D Deficiency, Fractures)  Result  Value Ref Range   VITD 26.66 (L) 30.00 - 100.00 ng/mL  TSH  Result Value Ref Range   TSH 0.88 0.35 -  5.50 uIU/mL  T4, free  Result Value Ref Range   Free T4 0.87 0.60 - 1.60 ng/dL      Assessment & Plan:   Problem List Items Addressed This Visit       Other   Anxiety and depression - Primary    Chronic, exacerbated at this time but stable.  Denies SI/HI.  Currently her mother has ovarian cancer and recently found out husband cheated on her.  Will maintain Wellbutrin and Buspar + Vistaril.  Trazodone as needed for sleep + Xanax as ordered by GYN.  Monitor mood closely and adjust regimen as needed.        Overweight (BMI 25.0-29.9)    BMI today 22.14 -- she has reached goal on Wegovy and lost total of 51 lbs.  We will initiate reducing dosing to a maintenance dose, start with reduction to 1.7 MG. Discussed with patient and she is agreeable to this change.  Plan on weight recheck January.        Follow up plan: Return in about 4 months (around 09/21/2022) for Annual physical.

## 2022-07-04 ENCOUNTER — Other Ambulatory Visit: Payer: Self-pay

## 2022-07-04 ENCOUNTER — Encounter: Payer: Self-pay | Admitting: Nurse Practitioner

## 2022-07-04 MED ORDER — SUMATRIPTAN SUCCINATE 25 MG PO TABS
25.0000 mg | ORAL_TABLET | ORAL | 12 refills | Status: DC | PRN
  Filled 2022-07-04: qty 9, 15d supply, fill #0
  Filled 2022-07-27: qty 9, 15d supply, fill #1
  Filled 2022-08-30: qty 9, 15d supply, fill #2
  Filled 2022-09-30: qty 9, 15d supply, fill #3

## 2022-07-07 ENCOUNTER — Other Ambulatory Visit: Payer: Self-pay

## 2022-07-27 ENCOUNTER — Other Ambulatory Visit: Payer: Self-pay

## 2022-07-29 ENCOUNTER — Other Ambulatory Visit: Payer: Self-pay

## 2022-08-26 ENCOUNTER — Encounter: Payer: Self-pay | Admitting: Nurse Practitioner

## 2022-08-30 ENCOUNTER — Other Ambulatory Visit: Payer: Self-pay

## 2022-08-30 NOTE — Telephone Encounter (Signed)
Pt scheduled  

## 2022-08-31 ENCOUNTER — Encounter: Payer: Self-pay | Admitting: Nurse Practitioner

## 2022-08-31 ENCOUNTER — Telehealth (INDEPENDENT_AMBULATORY_CARE_PROVIDER_SITE_OTHER): Payer: No Typology Code available for payment source | Admitting: Nurse Practitioner

## 2022-08-31 ENCOUNTER — Other Ambulatory Visit: Payer: No Typology Code available for payment source

## 2022-08-31 ENCOUNTER — Other Ambulatory Visit: Payer: Self-pay

## 2022-08-31 DIAGNOSIS — J101 Influenza due to other identified influenza virus with other respiratory manifestations: Secondary | ICD-10-CM | POA: Diagnosis not present

## 2022-08-31 DIAGNOSIS — R0981 Nasal congestion: Secondary | ICD-10-CM

## 2022-08-31 LAB — VERITOR FLU A/B WAIVED
Influenza A: NEGATIVE
Influenza B: POSITIVE — AB

## 2022-08-31 MED ORDER — OSELTAMIVIR PHOSPHATE 75 MG PO CAPS
75.0000 mg | ORAL_CAPSULE | Freq: Two times a day (BID) | ORAL | 0 refills | Status: AC
  Filled 2022-08-31: qty 10, 5d supply, fill #0

## 2022-08-31 MED ORDER — LIDOCAINE VISCOUS HCL 2 % MT SOLN
15.0000 mL | OROMUCOSAL | 0 refills | Status: DC | PRN
  Filled 2022-08-31: qty 100, 5d supply, fill #0

## 2022-08-31 MED ORDER — HYDROCOD POLI-CHLORPHE POLI ER 10-8 MG/5ML PO SUER
5.0000 mL | Freq: Every evening | ORAL | 0 refills | Status: DC | PRN
  Filled 2022-08-31: qty 70, 14d supply, fill #0

## 2022-08-31 NOTE — Progress Notes (Signed)
There were no vitals taken for this visit.   Subjective:    Patient ID: Jennifer Morse, female    DOB: December 17, 1990, 31 y.o.   MRN: [id number]  HPI: Jennifer Morse is a 31 y.o. female  Chief Complaint  Patient presents with   Cough    Sore throat, headache, fever, body aches. Was covid tested at work came back negative. Was swabbed here earlier for flu. Started yesterday   This visit was completed via video visit through MyChart due to the restrictions of the COVID-19 pandemic. All issues as above were discussed and addressed. Physical exam was done as above through visual confirmation on video through MyChart. If it was felt that the patient should be evaluated in the office, they were directed there. The patient verbally consented to this visit. Location of the patient: home Location of the provider: work Those involved with this call:  Provider: Marnee Guarneri, DNP CMA: Frazier Butt, [place] Desk/Registration: [employer]  Time spent on call:  21 minutes with patient face to face via video conference. More than 50% of this time was spent in counseling and coordination of care. 15 minutes total spent in review of patient's record and preparation of their chart.  I verified patient identity using two factors (patient name and date of birth). Patient consents verbally to being seen via telemedicine visit today.     UPPER RESPIRATORY TRACT INFECTION Started with symptoms yesterday, everyone in her family has had the flu and patients at work.  Testing today notes influenza B. Fever: yes Cough: yes Shortness of breath: no Wheezing: no Chest pain: no Chest tightness: yes Chest congestion: no Nasal congestion: yes Runny nose: yes Post nasal drip: yes Sneezing: no Sore throat: yes Swollen glands: no Sinus pressure: yes Headache: yes Face pain: no Toothache: no Ear pain: none Ear pressure: none Eyes red/itching:no Eye drainage/crusting: no  Vomiting: no Rash:  no Fatigue: yes Sick contacts: yes Strep contacts: no  Context: fluctuating Recurrent sinusitis: no Relief with OTC cold/cough medications: yes  Treatments attempted: Excedrin migraine    Relevant past medical, surgical, family and social history reviewed and updated as indicated. Interim medical history since our last visit reviewed. Allergies and medications reviewed and updated.  Review of Systems  Constitutional:  Positive for chills, fatigue and fever. Negative for activity change, appetite change and diaphoresis.  HENT:  Positive for congestion, postnasal drip, rhinorrhea, sinus pressure and sore throat. Negative for ear discharge, ear pain, facial swelling, sinus pain, sneezing and voice change.   Eyes:  Negative for pain and visual disturbance.  Respiratory:  Positive for cough and chest tightness. Negative for shortness of breath and wheezing.   Cardiovascular:  Negative for chest pain, palpitations and leg swelling.  Gastrointestinal: Negative.   Musculoskeletal:  Positive for myalgias.  Neurological:  Positive for headaches. Negative for dizziness and numbness.  Psychiatric/Behavioral: Negative.      Per HPI unless specifically indicated above     Objective:    There were no vitals taken for this visit.  Wt Readings from Last 3 Encounters:  05/27/22 145 lb 9.6 oz (66 kg)  05/24/22 147 lb 12.8 oz (67 kg)  03/03/22 161 lb (73 kg)    Physical Exam Vitals and nursing note reviewed.  Constitutional:      General: She is awake. She is not in acute distress.    Appearance: She is well-developed and well-groomed. She is ill-appearing. She is not toxic-appearing.  HENT:  Head: Normocephalic.     Right Ear: Hearing normal.     Left Ear: Hearing normal.  Eyes:     General: Lids are normal.        Right eye: No discharge.        Left eye: No discharge.     Conjunctiva/sclera: Conjunctivae normal.  Pulmonary:     Effort: Pulmonary effort is normal. No accessory  muscle usage or respiratory distress.  Musculoskeletal:     Cervical back: Normal range of motion.  Neurological:     Mental Status: She is alert and oriented to person, place, and time.  Psychiatric:        Attention and Perception: Attention normal.        Mood and Affect: Mood normal.        Behavior: Behavior normal. Behavior is cooperative.        Thought Content: Thought content normal.        Judgment: Judgment normal.     Results for orders placed or performed in visit on 08/31/22  Veritor Flu A/B Waived  Result Value Ref Range   Influenza A Negative Negative   Influenza B Positive (A) Negative      Assessment & Plan:   Problem List Items Addressed This Visit       Respiratory   Influenza B - Primary    Acute for 24 hours, start Tamiflu BID for 5 days.  Will send in Tussionex and viscous lidocaine for symptom relief.  Educated her on these medications and use.  Recommend: - Increased rest - Increasing Fluids - Acetaminophen / ibuprofen as needed for fever/pain.  - Salt water gargling, chloraseptic spray and throat lozenges - Mucinex.  - Humidifying the air.       Relevant Medications   oseltamivir (TAMIFLU) 75 MG capsule    I discussed the assessment and treatment plan with the patient. The patient was provided an opportunity to ask questions and all were answered. The patient agreed with the plan and demonstrated an understanding of the instructions.   The patient was advised to call back or seek an in-person evaluation if the symptoms worsen or if the condition fails to improve as anticipated.   I provided 21+ minutes of time during this encounter.    Follow up plan: No follow-ups on file.

## 2022-08-31 NOTE — Patient Instructions (Signed)

## 2022-08-31 NOTE — Assessment & Plan Note (Signed)
Acute for 24 hours, start Tamiflu BID for 5 days.  Will send in Tussionex and viscous lidocaine for symptom relief.  Educated her on these medications and use.  Recommend: - Increased rest - Increasing Fluids - Acetaminophen / ibuprofen as needed for fever/pain.  - Salt water gargling, chloraseptic spray and throat lozenges - Mucinex.  - Humidifying the air.

## 2022-09-19 ENCOUNTER — Encounter: Payer: Self-pay | Admitting: Internal Medicine

## 2022-09-19 ENCOUNTER — Ambulatory Visit: Payer: 59 | Admitting: Internal Medicine

## 2022-09-19 VITALS — BP 110/70 | HR 60 | Ht 68.0 in | Wt 155.0 lb

## 2022-09-19 DIAGNOSIS — E041 Nontoxic single thyroid nodule: Secondary | ICD-10-CM

## 2022-09-19 LAB — TSH: TSH: 1.25 u[IU]/mL (ref 0.35–5.50)

## 2022-09-19 LAB — T4, FREE: Free T4: 0.92 ng/dL (ref 0.60–1.60)

## 2022-09-19 NOTE — Progress Notes (Unsigned)
Name: Jennifer Morse  MRN/ DOB: [id number], 06/28/1991    Age/ Sex: 32 y.o., female    PCP: Venita Lick, NP   Reason for Endocrinology Evaluation: Thyroid nodule     Date of Initial Endocrinology Evaluation: 01/17/2022    HPI: Jennifer Morse is a 32 y.o. female with a past medical history of migraine headaches, hypothyroid. The patient presented for initial endocrinology clinic visit on 01/17/2022 for consultative assistance with her thyroid nodule.   She has a Hx of hypothyroidism in 2018 . She was on LT-4 replacement for a couple years until she self discontinued    She has been diagnosed with an isthmic nodule at 1 cm in diameter in 2018.  Repeat ultrasound in December 2022 showed increase in size to 1.7 cm meeting FNA criteria.  She is s/p FNA of the isthmic nodule on 10/04/2021 with benign cytology    No FH of thyroid disease   She has 2 kids, 5 and 1 yr      SUBJECTIVE:    Today (09/19/22:  Jennifer Morse is here for a follow up on thyroid nodule.    She has been noted with weight gain  Denies local neck swelling  Constipation has been improving  Denies palpitations  Denies tremors  NO biotin   No plans of conception  She is on MVI     HISTORY:  Past Medical History:  Past Medical History:  Diagnosis Date  . Anxiety   . BRCA negative 07/2021   MyRisk neg; IBIS=11.0%.riskscore=15.8%  . Degenerative lumbar disc   . Frequent headaches   . Gestational thrombocytopenia ([place])   . Lumbar herniated disc   . Migraine with aura, intractable, with status migrainosus 1995  . Oligomenorrhea   . Postpartum depression    with first pregnancy   Past Surgical History:  Past Surgical History:  Procedure Laterality Date  . WISDOM TOOTH EXTRACTION  2012    Social History:  reports that she quit smoking about 7 years ago. Her smoking use included cigarettes. She smoked an average of .25 packs per day. She has never used smokeless tobacco. She reports  current alcohol use of about 1.0 standard drink of alcohol per week. She reports that she does not use drugs. Family History: family history includes Diabetes in her maternal aunt, maternal grandfather, maternal grandmother, mother, and sister; Heart disease in her maternal grandfather and maternal grandmother; Hypertension in her maternal grandfather and maternal grandmother; Kidney cancer in her maternal grandmother.   HOME MEDICATIONS: Allergies as of 09/19/2022   No Known Allergies      Medication List        Accurate as of September 19, 2022  7:20 AM. If you have any questions, ask your nurse or doctor.          ALPRAZolam 0.5 MG tablet Commonly known as: XANAX Take 1/2-1 tablet (0.25-0.5 mg total) by mouth 2 (two) times daily as needed (Anxiety) (Take 0.5-1 tablets (0.25-0.5 mg total) by mouth 2 (two) times daily as needed (Anxiety))   aspirin-acetaminophen-caffeine 250-250-65 MG tablet Commonly known as: EXCEDRIN MIGRAINE Take by mouth every 6 (six) hours as needed for headache.   buPROPion 300 MG 24 hr tablet Commonly known as: Wellbutrin XL Take 1 tablet (300 mg total) by mouth daily.   busPIRone 7.5 MG tablet Commonly known as: BUSPAR Take 1 tablet (7.5 mg total) by mouth 2 (two) times daily.   chlorpheniramine-HYDROcodone 10-8 MG/5ML Commonly known as: TUSSIONEX  Take 5 mLs by mouth at bedtime as needed for cough.   gabapentin 600 MG tablet Commonly known as: NEURONTIN Take 1/2  tablet (300 mg) by mouth at bedtime. (Take 0.5 tablets (300 mg total) by mouth at bedtime.)   hydrOXYzine 25 MG tablet Commonly known as: ATARAX Take 1 tablet (25 mg total) by mouth every 6 (six) hours as needed for anxiety.   levonorgestrel 20 MCG/24HR IUD Commonly known as: MIRENA 1 each by Intrauterine route once.   lidocaine 2 % solution Commonly known as: XYLOCAINE Use as directed 15 mLs in the mouth or throat as needed for mouth pain.   meloxicam 7.5 MG tablet Commonly  known as: MOBIC Take 1 tablet (7.5 mg total) by mouth daily.   methocarbamol 500 MG tablet Commonly known as: ROBAXIN Take 1 tablet (500 mg total) by mouth every 8 (eight) hours as needed for muscle spasms.   Procto-Med HC 2.5 % rectal cream Generic drug: hydrocortisone Place 1 application rectally 2 (two) times daily. (Place 1 application. rectally 2 (two) times daily.)   SUMAtriptan 25 MG tablet Commonly known as: Imitrex Take 1 tablet (25 mg total) by mouth every 2 (two) hours as needed for migraine. May repeat in 2 hours if headache persists or recurs.   traZODone 50 MG tablet Commonly known as: DESYREL Take 1 tablet (50 mg total) by mouth at bedtime as needed for sleep.   Wegovy 1.7 MG/0.75ML Soaj Generic drug: Semaglutide-Weight Management Inject 1.7 mg into the skin once a week.          REVIEW OF SYSTEMS: A comprehensive ROS was conducted with the patient and is negative except as per HPI    OBJECTIVE:  VS: There were no vitals taken for this visit.   Wt Readings from Last 3 Encounters:  05/27/22 145 lb 9.6 oz (66 kg)  05/24/22 147 lb 12.8 oz (67 kg)  03/03/22 161 lb (73 kg)     EXAM: General: Pt appears well and is in NAD  Neck: General: Supple without adenopathy. Thyroid: Thyroid nodule appreciated.   Lungs: Clear with good BS bilat with no rales, rhonchi, or wheezes  Heart: Auscultation: RRR.  Abdomen: Normoactive bowel sounds, soft, nontender, without masses or organomegaly palpable  Extremities:  BL LE: No pretibial edema normal ROM and strength.  Mental Status: Judgment, insight: Intact Orientation: Oriented to time, place, and person Mood and affect: No depression, anxiety, or agitation     DATA REVIEWED:  Latest Reference Range & Units 01/17/22 11:26  TSH 0.35 - 5.50 uIU/mL 0.88  T4,Free(Direct) 0.60 - 1.60 ng/dL 0.87      Latest Reference Range & Units 01/17/22 11:26  VITD 30.00 - 100.00 ng/mL 26.66 (L)      Thyroid ultrasound  08/26/2021 Estimated total number of nodules >/= 1 cm: 1   Number of spongiform nodules >/=  2 cm not described below (TR1): 0   Number of mixed cystic and solid nodules >/= 1.5 cm not described below ([place]): 0   _________________________________________________________   Nodule # 1:   Prior biopsy: No   Location: Isthmus; Superior   Maximum size: 1.7 cm; Other 2 dimensions: 1.4 x 1.1 cm, previously, 1.0 x 0.7 x 0.5 cm   Composition: solid/almost completely solid (2)   Echogenicity: hypoechoic (2)   Shape: not taller-than-wide (0)   Margins: smooth (0)   Echogenic foci: none (0)   ACR TI-RADS total points: 4.   ACR TI-RADS risk category:  TR4 (4-6 points).  Significant change in size (>/= 20% in two dimensions and minimal increase of 2 mm): Yes   Change in features: Yes - nodule now appears hypoechoic, previously, isoechoic   Change in ACR TI-RADS risk category: Yes - previously a TR3 nodule, presently a TR4 nodule   ACR TI-RADS recommendations:   **Given size (>/= 1.5 cm) and appearance, fine needle aspiration of this moderately suspicious nodule should be considered based on TI-RADS criteria.   _________________________________________________________   Previously noted punctate (0.6 cm) anechoic cyst within the left lobe of the thyroid is not seen on the present examination.   IMPRESSION: Solitary isthmic nodule has increased in size and now appears hypoechoic and as such has been up graded from a TR3 nodule to a TR4 nodule and meets imaging criteria to recommend percutaneous sampling as indicated.     FNA isthmic nodule 10/04/2021 Specimen Submitted:  A. Thyroid, midline isthmus nodule   Clinical History: None provided     DIAGNOSIS:  A. THYROID GLAND, MIDLINE ISTHMUS; ULTRASOUND-GUIDED FINE-NEEDLE  ASPIRATION:  - BENIGN (BETHESDA CATEGORY II).      ASSESSMENT/PLAN/RECOMMENDATIONS:   Thyroid nodule:   -Patient is biochemically  euthyroid -No local neck symptoms -She is s/p FNA of isthmic nodule with benign cytology -We will repeat ultrasound in a year    2.  History of renal stones   -She was on vitamin D3 but developed renal stones, she is concerned that the vitamin D caused it -I did discuss the benefit of vitamin D supplementation, we also discussed the risk of hypocalcemia which will lead to renal stones with excessive vitamin D intake -But it does not appear that she was on excessive amounts    3.  Fatigue:  -Vitamin D slightly low we will replenish -Vitamin B12 normal  Medications : Vitamin D3 800 IU daily    Follow-up in 1 year Signed electronically by: Mack Guise, MD  [facility]  [facility] [address], [place] [address] Phone: [phone] FAX: [phone]   CC: Venita Lick, NP [place] [address] Phone: [phone] Fax: [phone]   Return to Endocrinology clinic as below: Future Appointments  Date Time Provider [place]  09/19/2022  9:50 AM Maleki Hippe, Melanie Crazier, MD [facility] None  10/05/2022  9:00 AM Venita Lick, NP [facility]

## 2022-09-23 ENCOUNTER — Ambulatory Visit
Admission: RE | Admit: 2022-09-23 | Discharge: 2022-09-23 | Disposition: A | Discharge status: Home / Self Care | Payer: 59 | Source: Ambulatory Visit | Attending: Internal Medicine | Admitting: Internal Medicine

## 2022-09-23 DIAGNOSIS — E041 Nontoxic single thyroid nodule: Secondary | ICD-10-CM

## 2022-09-30 ENCOUNTER — Other Ambulatory Visit: Payer: Self-pay

## 2022-10-05 ENCOUNTER — Encounter: Payer: No Typology Code available for payment source | Admitting: Nurse Practitioner

## 2022-10-16 DIAGNOSIS — E559 Vitamin D deficiency, unspecified: Secondary | ICD-10-CM | POA: Insufficient documentation

## 2022-10-16 NOTE — Patient Instructions (Signed)

## 2022-10-19 ENCOUNTER — Other Ambulatory Visit: Payer: Self-pay

## 2022-10-19 ENCOUNTER — Encounter: Payer: Self-pay | Admitting: Nurse Practitioner

## 2022-10-19 ENCOUNTER — Ambulatory Visit (INDEPENDENT_AMBULATORY_CARE_PROVIDER_SITE_OTHER): Payer: 59 | Admitting: Nurse Practitioner

## 2022-10-19 VITALS — BP 97/62 | HR 67 | Temp 98.2°F | Ht 67.99 in | Wt 153.3 lb

## 2022-10-19 DIAGNOSIS — Z8041 Family history of malignant neoplasm of ovary: Secondary | ICD-10-CM | POA: Diagnosis not present

## 2022-10-19 DIAGNOSIS — F32A Depression, unspecified: Secondary | ICD-10-CM | POA: Diagnosis not present

## 2022-10-19 DIAGNOSIS — G43009 Migraine without aura, not intractable, without status migrainosus: Secondary | ICD-10-CM | POA: Diagnosis not present

## 2022-10-19 DIAGNOSIS — F419 Anxiety disorder, unspecified: Secondary | ICD-10-CM | POA: Diagnosis not present

## 2022-10-19 DIAGNOSIS — E039 Hypothyroidism, unspecified: Secondary | ICD-10-CM

## 2022-10-19 DIAGNOSIS — K5909 Other constipation: Secondary | ICD-10-CM | POA: Diagnosis not present

## 2022-10-19 DIAGNOSIS — Z0000 Encounter for general adult medical examination without abnormal findings: Secondary | ICD-10-CM

## 2022-10-19 DIAGNOSIS — E041 Nontoxic single thyroid nodule: Secondary | ICD-10-CM | POA: Diagnosis not present

## 2022-10-19 DIAGNOSIS — K601 Chronic anal fissure: Secondary | ICD-10-CM | POA: Diagnosis not present

## 2022-10-19 DIAGNOSIS — Z136 Encounter for screening for cardiovascular disorders: Secondary | ICD-10-CM | POA: Diagnosis not present

## 2022-10-19 DIAGNOSIS — E559 Vitamin D deficiency, unspecified: Secondary | ICD-10-CM | POA: Diagnosis not present

## 2022-10-19 DIAGNOSIS — E663 Overweight: Secondary | ICD-10-CM | POA: Diagnosis not present

## 2022-10-19 DIAGNOSIS — Z13220 Encounter for screening for lipoid disorders: Secondary | ICD-10-CM

## 2022-10-19 MED ORDER — SUMATRIPTAN SUCCINATE 100 MG PO TABS
100.0000 mg | ORAL_TABLET | ORAL | 12 refills | Status: DC | PRN
  Filled 2022-10-19: qty 9, 30d supply, fill #0
  Filled 2022-12-08: qty 9, 30d supply, fill #1
  Filled 2023-01-18: qty 9, 30d supply, fill #2
  Filled 2023-03-29: qty 9, 30d supply, fill #3
  Filled 2023-04-25 (×3): qty 9, 30d supply, fill #4
  Filled 2023-05-24: qty 9, 30d supply, fill #5
  Filled 2023-08-01: qty 9, 30d supply, fill #6
  Filled 2023-10-09: qty 9, 30d supply, fill #7

## 2022-10-19 MED ORDER — LUBIPROSTONE 24 MCG PO CAPS
24.0000 ug | ORAL_CAPSULE | Freq: Every day | ORAL | 12 refills | Status: DC
  Filled 2022-10-19: qty 30, 30d supply, fill #0
  Filled 2022-11-15: qty 30, 30d supply, fill #1

## 2022-10-19 NOTE — Assessment & Plan Note (Signed)
Chronic and ongoing.  Currently working on regulating bowel pattern to 2 BM a day as general surgery recommended to her.  She has tried multiple over the counter medications.  Will trial Lubiprostone 24 MCG daily.  Return in 4 weeks for follow-up.

## 2022-10-19 NOTE — Assessment & Plan Note (Signed)
In mother, she has gone through genetic testing which was negative.  Continue to collaborate with GYN.

## 2022-10-19 NOTE — Assessment & Plan Note (Signed)
Ongoing, noted past labs.  Recommend Vitamin D3 2000 units daily.  Recheck level today.

## 2022-10-19 NOTE — Assessment & Plan Note (Signed)
Chronic, return to general surgery as needed.  Currently working on regulating bowel pattern to 2 BM a day as general surgery recommended to her.  She has tried multiple over the counter medications.  Will trial Lubiprostone 24 MCG daily.  Return in 4 weeks for follow-up.

## 2022-10-19 NOTE — Assessment & Plan Note (Signed)
Chronic, ongoing.  Poorly tolerated Propranolol in past due to at baseline lower resting HR and did not tolerate Topamax. Will continue Gabapentin 300 MG nightly.  Educated on this and migraine prevention.  Continue Excedrin as needed.  May trial Effexor in future, which would offer benefit for mood and migraines, however weight gain is a concern for patient.  Increase Maxalt to 100 MG as needed, took this dosing in past with benefit.  Return in 6 months.

## 2022-10-19 NOTE — Assessment & Plan Note (Signed)
Chronic, stable without medication.  Recent levels stable.  Thyroid biopsy negative.

## 2022-10-19 NOTE — Assessment & Plan Note (Signed)
BMI today 23.31 (maintaining) -- she has reached goal on Wegovy and lost total of > 51 lbs.  We will continue on reduced dosing Wegovy at this time, 1.7 MG. Discussed with patient and she is agreeable to this change.  Could consider further reduction in future.

## 2022-10-19 NOTE — Assessment & Plan Note (Addendum)
Followed by endo with recent visit noting stable thyroid labs and nodule reducing in size.  Continue this collaboration.

## 2022-10-19 NOTE — Assessment & Plan Note (Signed)
Chronic, stable.  Denies SI/HI.  Currently her mother has ovarian cancer and struggles with spouse.  Will maintain Wellbutrin and Buspar + Vistaril.  Trazodone as needed for sleep + Xanax as ordered by GYN.  Monitor mood closely and adjust regimen as needed.

## 2022-10-19 NOTE — Progress Notes (Signed)
BP 97/62   Pulse 67   Temp 98.2 F (36.8 C) (Oral)   Ht 5' 7.99" (1.727 m)   Wt 153 lb 4.8 oz (69.5 kg)   SpO2 97%   BMI 23.31 kg/m    Subjective:    Patient ID: Jennifer Morse, female    DOB: 04/04/1991, 32 y.o.   MRN: [id number]  HPI: SOHINI Morse is a 32 y.o. female presenting on 10/19/2022 for comprehensive medical examination. 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  Continues to have irregular BM and when has them it is uncomfortable and she bleeds.  Has tried Metamucil, Miralax, Colace.  MIGRAINES Having more migraines recently due to stressors and weather changes.  Currently takes Excedrin Migraine and Imitrex -- used to take 100 MG Imitrex, would like to return to this as worked better.  Typically gets headache 2-3 times a week.  Has tried Propranolol in past, which lowered heart rate + Topamax in past -- still got headaches. Currently is taking Gabapentin nightly, which she also takes for degenerative disc.  Currently working at [employer].  Duration: chronic Onset: gradual Severity: 8/10 Quality: dull, aching, and throbbing Frequency: intermittent Location: temples all the way back into neck Headache duration: Radiation: no Time of day headache occurs: varies Alleviating factors: Excedrin Aggravating factors: rainy days Headache status at time of visit: present currently with weather changes Treatments attempted: as above   Aura: no Nausea:  no Vomiting: no Photophobia:  yes Phonophobia:  yes Effect on social functioning:  yes Numbers of missed days of school/work each month: 0 Confusion:  no Gait disturbance/ataxia:  no Behavioral changes:  no Fevers:  no    HYPOTHYROIDISM Saw endo on 09/19/22 and nodule was smaller  on imaging + normal thyroid labs.     Continues on [place] for weight loss, currently 1.7 MG weekly.  Is tolerating well.  Currently down 51 pounds since December 2022, was 196 lbs. Continues to tolerate [place] well and is working on exercise and diet regimen.    Thyroid control status:stable Fatigue: yes Cold intolerance: no Heat intolerance: no Weight gain: none Weight loss: no Constipation: yes Diarrhea/loose stools: no Palpitations: no Lower extremity edema: no Anxiety/depressed mood: yes    ANXIETY/STRESS Her mom was diagnosed with Stage 3 ovarian cancer in September 2022. Patient had genetic testing done which was negative. Currently patient and her husband are working things out after her husband had affair.   Currently taking Wellbutrin and Buspar == PRN Trazodone and Vistaril.   Duration:stable Anxious mood: mild at times Excessive worrying: yes Irritability: no  Sweating: no Nausea: no Palpitations:no Hyperventilation: no Panic attacks: none Agoraphobia: no  Obscessions/compulsions: no Depressed mood: yes    10/19/2022    3:32 PM 05/27/2022   10:01 AM 02/25/2022    4:22 PM 01/21/2022   10:47 AM 11/19/2021   10:23 AM  Depression screen PHQ 2/9  Decreased Interest 0 0 0 0 1  Down, Depressed, Hopeless 1 1 0 0 0  PHQ - 2 Score 1 1 0 0 1  Altered sleeping 0 1 0 1 0  Tired, decreased energy 1 1 1 2 1  $ Change in appetite 0 0 0 0 0  Feeling bad or failure about yourself  1 1 0 0  1  Trouble concentrating 0 1 0 0 0  Moving slowly or fidgety/restless 0 0 0 0 0  Suicidal thoughts 0 0 0 0 0  PHQ-9 Score 3 5 1 3 3  $ Difficult doing work/chores Not difficult at all Somewhat difficult Not difficult at all         10/19/2022    3:32 PM 05/27/2022   10:01 AM 02/25/2022    4:22 PM 01/21/2022   10:48 AM  GAD 7 : Generalized Anxiety Score  Nervous, Anxious, on Edge 1 1 1 1  $ Control/stop worrying 1 2 0 0  Worry too much - different things 1 2 1 1  $ Trouble relaxing 0 1 0 0   Restless 0 0 0 0  Easily annoyed or irritable 0 1 1 0  Afraid - awful might happen 0 1 0 0  Total GAD 7 Score 3 8 3 2  $ Anxiety Difficulty Not difficult at all Somewhat difficult Not difficult at all Not difficult at all      01/21/2022   10:47 AM 02/25/2022    4:22 PM 03/03/2022    8:28 AM 05/27/2022   10:01 AM 10/19/2022    3:32 PM  Fall Risk  Falls in the past year? 0 0 0 0 0  Was there an injury with Fall? 0 0  0 0  Fall Risk Category Calculator 0 0  0 0  Fall Risk Category (Retired) Low Low  Low   (RETIRED) Patient Fall Risk Level Low fall risk Low fall risk  Low fall risk   Patient at Risk for Falls Due to No Fall Risks No Fall Risks  No Fall Risks   Fall risk Follow up Falls evaluation completed Falls evaluation completed  Falls evaluation completed     Past Medical History:  Past Medical History:  Diagnosis Date   Anxiety    BRCA negative 07/2021   MyRisk neg; IBIS=11.0%.riskscore=15.8%   Degenerative lumbar disc    Frequent headaches    Gestational thrombocytopenia (HCC)    Lumbar herniated disc    Migraine with aura, intractable, with status migrainosus 1995   Oligomenorrhea    Postpartum depression    with first pregnancy    Surgical History:  Past Surgical History:  Procedure Laterality Date   WISDOM TOOTH EXTRACTION  2012    Medications:  Current Outpatient Medications on File Prior to Visit  Medication Sig   ALPRAZolam (XANAX) 0.5 MG tablet Take 0.5-1 tablets (0.25-0.5 mg total) by mouth 2 (two) times daily as needed (Anxiety)   aspirin-acetaminophen-caffeine (EXCEDRIN MIGRAINE) 250-250-65 MG tablet Take by mouth every 6 (six) hours as needed for headache.   buPROPion (WELLBUTRIN XL) 300 MG 24 hr tablet Take 1 tablet (300 mg total) by mouth daily.   busPIRone (BUSPAR) 7.5 MG tablet Take 1 tablet (7.5 mg total) by mouth 2 (two) times daily.   gabapentin (NEURONTIN) 600 MG tablet Take 0.5 tablets (300 mg total) by mouth at bedtime.   hydrocortisone  (ANUSOL-HC) 2.5 % rectal cream Place 1 application. rectally 2 (two) times daily.   hydrOXYzine (ATARAX/VISTARIL) 25 MG tablet Take 1 tablet (25 mg total) by mouth every 6 (six) hours as needed for anxiety.   levonorgestrel (MIRENA) 20 MCG/24HR IUD 1 each by Intrauterine route once.   meloxicam (MOBIC) 7.5 MG tablet Take 1 tablet (7.5 mg total) by mouth daily.   methocarbamol (ROBAXIN) 500 MG tablet Take 1 tablet (500 mg total) by mouth every 8 (eight) hours as needed for  muscle spasms.   Semaglutide-Weight Management (WEGOVY) 1.7 MG/0.75ML SOAJ Inject 1.7 mg into the skin once a week.   traZODone (DESYREL) 50 MG tablet Take 1 tablet (50 mg total) by mouth at bedtime as needed for sleep.   [DISCONTINUED] azelastine (ASTELIN) 0.1 % nasal spray Place 1 spray into both nostrils 2 (two) times daily. Use in each nostril as directed (Patient taking differently: No sig reported)   [DISCONTINUED] phentermine 37.5 MG capsule Take 1 capsule (37.5 mg total) by mouth every morning.   No current facility-administered medications on file prior to visit.    Allergies:  No Known Allergies  Social History:  Social History   Socioeconomic History   Marital status: Married    Spouse name: Edison Nasuti   Number of children: 2   Years of education: Not on file   Highest education level: Not on file  Occupational History   Occupation: RN  Tobacco Use   Smoking status: Former    Packs/day: 0.25    Types: Cigarettes    Quit date: 02/26/2015    Years since quitting: 7.6   Smokeless tobacco: Never  Vaping Use   Vaping Use: Never used  Substance and Sexual Activity   Alcohol use: Yes    Alcohol/week: 1.0 standard drink of alcohol    Types: 1 Glasses of wine per week   Drug use: No   Sexual activity: Yes    Partners: Male    Birth control/protection: I.U.D.  Other Topics Concern   Not on file  Social History Narrative   Not on file   Social Determinants of Health   Financial Resource Strain: Low Risk   (08/13/2021)   Overall Financial Resource Strain (CARDIA)    Difficulty of Paying Living Expenses: Not hard at all  Food Insecurity: No Food Insecurity (08/13/2021)   Hunger Vital Sign    Worried About Running Out of Food in the Last Year: Never true    Ran Out of Food in the Last Year: Never true  Transportation Needs: No Transportation Needs (08/13/2021)   PRAPARE - Hydrologist (Medical): No    Lack of Transportation (Non-Medical): No  Physical Activity: Insufficiently Active (08/13/2021)   Exercise Vital Sign    Days of Exercise per Week: 3 days    Minutes of Exercise per Session: 30 min  Stress: No Stress Concern Present (08/13/2021)   [place]    Feeling of Stress : Only a little  Social Connections: Moderately Isolated (08/13/2021)   Social Connection and Isolation Panel [NHANES]    Frequency of Communication with Friends and Family: More than three times a week    Frequency of Social Gatherings with Friends and Family: More than three times a week    Attends Religious Services: Never    Marine scientist or Organizations: No    Attends Archivist Meetings: Never    Marital Status: Married  Human resources officer Violence: Not At Risk (08/13/2021)   Humiliation, Afraid, Rape, and Kick questionnaire    Fear of Current or Ex-Partner: No    Emotionally Abused: No    Physically Abused: No    Sexually Abused: No   Social History   Tobacco Use  Smoking Status Former   Packs/day: 0.25   Types: Cigarettes   Quit date: 02/26/2015   Years since quitting: 7.6  Smokeless Tobacco Never   Social History   Substance and Sexual Activity  Alcohol Use Yes   Alcohol/week: 1.0 standard drink of alcohol   Types: 1 Glasses of wine per week    Family History:  Family History  Problem Relation Age of Onset   Diabetes Mother    Diabetes Sister    Diabetes Maternal Grandmother     Heart disease Maternal Grandmother    Hypertension Maternal Grandmother    Kidney cancer Maternal Grandmother    Diabetes Maternal Grandfather    Heart disease Maternal Grandfather    Hypertension Maternal Grandfather    Diabetes Maternal Aunt    Breast cancer Neg Hx    Ovarian cancer Neg Hx    Colon cancer Neg Hx     Past medical history, surgical history, medications, allergies, family history and social history reviewed with patient today and changes made to appropriate areas of the chart.   ROS All other ROS negative except what is listed above and in the HPI.      Objective:    BP 97/62   Pulse 67   Temp 98.2 F (36.8 C) (Oral)   Ht 5' 7.99" (1.727 m)   Wt 153 lb 4.8 oz (69.5 kg)   SpO2 97%   BMI 23.31 kg/m   Wt Readings from Last 3 Encounters:  10/19/22 153 lb 4.8 oz (69.5 kg)  09/19/22 155 lb (70.3 kg)  05/27/22 145 lb 9.6 oz (66 kg)    Physical Exam Vitals and nursing note reviewed. Exam conducted with a chaperone present.  Constitutional:      General: She is awake. She is not in acute distress.    Appearance: She is well-developed and well-groomed. She is not ill-appearing or toxic-appearing.  HENT:     Head: Normocephalic and atraumatic.     Right Ear: Hearing, tympanic membrane, ear canal and external ear normal. No drainage.     Left Ear: Hearing, tympanic membrane, ear canal and external ear normal. No drainage.     Nose: Nose normal.     Right Sinus: No maxillary sinus tenderness or frontal sinus tenderness.     Left Sinus: No maxillary sinus tenderness or frontal sinus tenderness.     Mouth/Throat:     Mouth: Mucous membranes are moist.     Pharynx: Oropharynx is clear. Uvula midline. No pharyngeal swelling, oropharyngeal exudate or posterior oropharyngeal erythema.  Eyes:     General: Lids are normal.        Right eye: No discharge.        Left eye: No discharge.     Extraocular Movements: Extraocular movements intact.     Conjunctiva/sclera:  Conjunctivae normal.     Pupils: Pupils are equal, round, and reactive to light.     Visual Fields: Right eye visual fields normal and left eye visual fields normal.  Neck:     Thyroid: No thyromegaly.     Vascular: No carotid bruit.     Trachea: Trachea normal.  Cardiovascular:     Rate and Rhythm: Normal rate and regular rhythm.     Heart sounds: Normal heart sounds. No murmur heard.    No gallop.  Pulmonary:     Effort: Pulmonary effort is normal. No accessory muscle usage or respiratory distress.     Breath sounds: Normal breath sounds.  Chest:  Breasts:    Right: Normal.     Left: Normal.  Abdominal:     General: Bowel sounds are normal.     Palpations: Abdomen is soft. There is no hepatomegaly or splenomegaly.  Tenderness: There is no abdominal tenderness.  Musculoskeletal:        General: Normal range of motion.     Cervical back: Normal range of motion and neck supple.     Right lower leg: No edema.     Left lower leg: No edema.  Lymphadenopathy:     Head:     Right side of head: No submental, submandibular, tonsillar, preauricular or posterior auricular adenopathy.     Left side of head: No submental, submandibular, tonsillar, preauricular or posterior auricular adenopathy.     Cervical: No cervical adenopathy.     Upper Body:     Right upper body: No supraclavicular, axillary or pectoral adenopathy.     Left upper body: No supraclavicular, axillary or pectoral adenopathy.  Skin:    General: Skin is warm and dry.     Capillary Refill: Capillary refill takes less than 2 seconds.     Findings: No rash.  Neurological:     Mental Status: She is alert and oriented to person, place, and time.     Gait: Gait is intact.     Deep Tendon Reflexes: Reflexes are normal and symmetric.     Reflex Scores:      Brachioradialis reflexes are 2+ on the right side and 2+ on the left side.      Patellar reflexes are 2+ on the right side and 2+ on the left side. Psychiatric:         Attention and Perception: Attention normal.        Mood and Affect: Mood normal.        Speech: Speech normal.        Behavior: Behavior normal. Behavior is cooperative.        Thought Content: Thought content normal.        Judgment: Judgment normal.     Results for orders placed or performed in visit on 09/19/22  TSH  Result Value Ref Range   TSH 1.25 0.35 - 5.50 uIU/mL  T4, free  Result Value Ref Range   Free T4 0.92 0.60 - 1.60 ng/dL      Assessment & Plan:   Problem List Items Addressed This Visit       Cardiovascular and Mediastinum   Migraine    Chronic, ongoing.  Poorly tolerated Propranolol in past due to at baseline lower resting HR and did not tolerate Topamax. Will continue Gabapentin 300 MG nightly.  Educated on this and migraine prevention.  Continue Excedrin as needed.  May trial Effexor in future, which would offer benefit for mood and migraines, however weight gain is a concern for patient.  Increase Maxalt to 100 MG as needed, took this dosing in past with benefit.  Return in 6 months.      Relevant Medications   SUMAtriptan (IMITREX) 100 MG tablet   Other Relevant Orders   CBC with Differential/Platelet   Comprehensive metabolic panel     Digestive   Chronic anal fissure    Chronic, return to general surgery as needed.  Currently working on regulating bowel pattern to 2 BM a day as general surgery recommended to her.  She has tried multiple over the counter medications.  Will trial Lubiprostone 24 MCG daily.  Return in 4 weeks for follow-up.      Chronic constipation    Chronic and ongoing.  Currently working on regulating bowel pattern to 2 BM a day as general surgery recommended to her.  She has tried multiple over  the counter medications.  Will trial Lubiprostone 24 MCG daily.  Return in 4 weeks for follow-up.        Endocrine   Hypothyroidism - Primary    Chronic, stable without medication.  Recent levels stable.  Thyroid biopsy negative.         Thyroid nodule    Followed by endo with recent visit noting stable thyroid labs and nodule reducing in size.  Continue this collaboration.        Other   Anxiety and depression    Chronic, stable.  Denies SI/HI.  Currently her mother has ovarian cancer and struggles with spouse.  Will maintain Wellbutrin and Buspar + Vistaril.  Trazodone as needed for sleep + Xanax as ordered by GYN.  Monitor mood closely and adjust regimen as needed.        Family history of ovarian cancer    In mother, she has gone through genetic testing which was negative.  Continue to collaborate with GYN.      Overweight (BMI 25.0-29.9)    BMI today 23.31 (maintaining) -- she has reached goal on Wegovy and lost total of > 51 lbs.  We will continue on reduced dosing Wegovy at this time, 1.7 MG. Discussed with patient and she is agreeable to this change.  Could consider further reduction in future.      Vitamin D deficiency    Ongoing, noted past labs.  Recommend Vitamin D3 2000 units daily.  Recheck level today.      Relevant Orders   Vitamin D (25 hydroxy)   Other Visit Diagnoses     Encounter for lipid screening for cardiovascular disease       Lipid panel on labs today.   Relevant Orders   Comprehensive metabolic panel   Lipid Panel w/o Chol/HDL Ratio   Encounter for annual physical exam       Annual physical today with labs and health maintenance reviewed, discussed with patient.        Follow up plan: Return in about 4 weeks (around 11/16/2022) for Constipation --started Lubiprostone.   LABORATORY TESTING:  - Pap smear: Up to date  IMMUNIZATIONS:   - Tdap: Tetanus vaccination status reviewed: last tetanus booster within 10 years. - Influenza: Up to date - Pneumovax: Not applicable - Prevnar: Not applicable - COVID: Refused - HPV: Not applicable - Shingrix vaccine: Not applicable  SCREENING: -Mammogram: Not applicable  - Colonoscopy: Not applicable  - Bone Density: Not applicable   -Hearing Test: Not applicable  -Spirometry: Not applicable   PATIENT COUNSELING:   Advised to take 1 mg of folate supplement per day if capable of pregnancy.   Sexuality: Discussed sexually transmitted diseases, partner selection, use of condoms, avoidance of unintended pregnancy  and contraceptive alternatives.   Advised to avoid cigarette smoking.  I discussed with the patient that most people either abstain from alcohol or drink within safe limits (<=14/week and <=4 drinks/occasion for males, <=7/weeks and <= 3 drinks/occasion for females) and that the risk for alcohol disorders and other health effects rises proportionally with the number of drinks per week and how often a drinker exceeds daily limits.  Discussed cessation/primary prevention of drug use and availability of treatment for abuse.   Diet: Encouraged to adjust caloric intake to maintain  or achieve ideal body weight, to reduce intake of dietary saturated fat and total fat, to limit sodium intake by avoiding high sodium foods and not adding table salt, and to maintain adequate dietary potassium  and calcium preferably from fresh fruits, vegetables, and low-fat dairy products.    Stressed the importance of regular exercise  Injury prevention: Discussed safety belts, safety helmets, smoke detector, smoking near bedding or upholstery.   Dental health: Discussed importance of regular tooth brushing, flossing, and dental visits.    NEXT PREVENTATIVE PHYSICAL DUE IN 1 YEAR. Return in about 4 weeks (around 11/16/2022) for Constipation --started Lubiprostone.

## 2022-10-20 LAB — CBC WITH DIFFERENTIAL/PLATELET
Basophils Absolute: 0 10*3/uL (ref 0.0–0.2)
Basos: 1 %
EOS (ABSOLUTE): 0.1 10*3/uL (ref 0.0–0.4)
Eos: 2 %
Hematocrit: 37.2 % (ref 34.0–46.6)
Hemoglobin: 12.6 g/dL (ref 11.1–15.9)
Immature Grans (Abs): 0 10*3/uL (ref 0.0–0.1)
Immature Granulocytes: 0 %
Lymphocytes Absolute: 1.5 10*3/uL (ref 0.7–3.1)
Lymphs: 40 %
MCH: 30.1 pg (ref 26.6–33.0)
MCHC: 33.9 g/dL (ref 31.5–35.7)
MCV: 89 fL (ref 79–97)
Monocytes Absolute: 0.3 10*3/uL (ref 0.1–0.9)
Monocytes: 9 %
Neutrophils Absolute: 1.9 10*3/uL (ref 1.4–7.0)
Neutrophils: 48 %
Platelets: 121 10*3/uL — ABNORMAL LOW (ref 150–450)
RBC: 4.18 x10E6/uL (ref 3.77–5.28)
RDW: 12.2 % (ref 11.7–15.4)
WBC: 3.9 10*3/uL (ref 3.4–10.8)

## 2022-10-20 LAB — LIPID PANEL W/O CHOL/HDL RATIO
Cholesterol, Total: 152 mg/dL (ref 100–199)
HDL: 61 mg/dL (ref >39)
LDL Chol Calc (NIH): 77 mg/dL (ref 0–99)
Triglycerides: 69 mg/dL (ref 0–149)
VLDL Cholesterol Cal: 14 mg/dL (ref 5–40)

## 2022-10-20 LAB — COMPREHENSIVE METABOLIC PANEL
ALT: 15 IU/L (ref 0–32)
AST: 14 IU/L (ref 0–40)
Albumin/Globulin Ratio: 2.4 — ABNORMAL HIGH (ref 1.2–2.2)
Albumin: 4.6 g/dL (ref 3.9–4.9)
Alkaline Phosphatase: 55 IU/L (ref 44–121)
BUN/Creatinine Ratio: 12 (ref 9–23)
BUN: 12 mg/dL (ref 6–20)
Bilirubin Total: 0.4 mg/dL (ref 0.0–1.2)
CO2: 22 mmol/L (ref 20–29)
Calcium: 9.2 mg/dL (ref 8.7–10.2)
Chloride: 104 mmol/L (ref 96–106)
Creatinine, Ser: 1.04 mg/dL — ABNORMAL HIGH (ref 0.57–1.00)
Globulin, Total: 1.9 g/dL (ref 1.5–4.5)
Glucose: 85 mg/dL (ref 70–99)
Potassium: 4.1 mmol/L (ref 3.5–5.2)
Sodium: 140 mmol/L (ref 134–144)
Total Protein: 6.5 g/dL (ref 6.0–8.5)
eGFR: 73 mL/min/{1.73_m2} (ref >59)

## 2022-10-20 LAB — VITAMIN D 25 HYDROXY (VIT D DEFICIENCY, FRACTURES): Vit D, 25-Hydroxy: 30.7 ng/mL (ref 30.0–100.0)

## 2022-10-20 NOTE — Progress Notes (Signed)
Contacted via MyChart   Good evening Jennifer Morse, your labs have returned and overall they look great with exception of mild low platelet count which we will recheck next visit to see if continues or improves.  Overall stable labs, no changes needed. Any questions? Keep being amazing!!  Thank you for allowing me to participate in your care.  I appreciate you. Kindest regards, Lisa-Marie Rueger

## 2022-11-02 ENCOUNTER — Telehealth: Payer: Self-pay

## 2022-11-02 NOTE — Telephone Encounter (Signed)
Paperwork faxed back to [employer]

## 2022-11-03 ENCOUNTER — Other Ambulatory Visit: Payer: Self-pay

## 2022-11-13 NOTE — Patient Instructions (Signed)

## 2022-11-15 ENCOUNTER — Encounter: Payer: Self-pay | Admitting: Emergency Medicine

## 2022-11-15 ENCOUNTER — Ambulatory Visit (INDEPENDENT_AMBULATORY_CARE_PROVIDER_SITE_OTHER): Payer: 59

## 2022-11-15 ENCOUNTER — Ambulatory Visit
Admission: EM | Admit: 2022-11-15 | Discharge: 2022-11-15 | Disposition: A | Discharge status: Home / Self Care | Payer: 59 | Attending: Emergency Medicine | Admitting: Emergency Medicine

## 2022-11-15 ENCOUNTER — Other Ambulatory Visit: Payer: Self-pay

## 2022-11-15 DIAGNOSIS — R109 Unspecified abdominal pain: Secondary | ICD-10-CM | POA: Diagnosis not present

## 2022-11-15 DIAGNOSIS — B3731 Acute candidiasis of vulva and vagina: Secondary | ICD-10-CM | POA: Insufficient documentation

## 2022-11-15 DIAGNOSIS — N760 Acute vaginitis: Secondary | ICD-10-CM | POA: Insufficient documentation

## 2022-11-15 DIAGNOSIS — B9689 Other specified bacterial agents as the cause of diseases classified elsewhere: Secondary | ICD-10-CM | POA: Diagnosis not present

## 2022-11-15 LAB — URINALYSIS, MICROSCOPIC (REFLEX): RBC / HPF: NONE SEEN RBC/hpf (ref 0–5)

## 2022-11-15 LAB — WET PREP, GENITAL
Sperm: NONE SEEN
Trich, Wet Prep: NONE SEEN
WBC, Wet Prep HPF POC: >10 — AB (ref <10)

## 2022-11-15 LAB — URINALYSIS, ROUTINE W REFLEX MICROSCOPIC
Bilirubin Urine: NEGATIVE
Glucose, UA: NEGATIVE mg/dL
Hgb urine dipstick: NEGATIVE
Ketones, ur: NEGATIVE mg/dL
Nitrite: NEGATIVE
Protein, ur: NEGATIVE mg/dL
Specific Gravity, Urine: 1.025 (ref 1.005–1.030)
pH: 6.5 (ref 5.0–8.0)

## 2022-11-15 MED ORDER — METRONIDAZOLE 500 MG PO TABS
500.0000 mg | ORAL_TABLET | Freq: Two times a day (BID) | ORAL | 0 refills | Status: DC
  Filled 2022-11-15: qty 14, 7d supply, fill #0

## 2022-11-15 MED ORDER — FLUCONAZOLE 150 MG PO TABS
150.0000 mg | ORAL_TABLET | Freq: Every day | ORAL | 0 refills | Status: AC
  Filled 2022-11-15: qty 2, 2d supply, fill #0

## 2022-11-15 MED ORDER — KETOROLAC TROMETHAMINE 30 MG/ML IJ SOLN
30.0000 mg | Freq: Once | INTRAMUSCULAR | Status: AC
  Administered 2022-11-15: 30 mg via INTRAMUSCULAR

## 2022-11-15 NOTE — ED Triage Notes (Signed)
Pt c/o RUQ pain. Started about 11 today. She states it started randomly. She also has nausea. No vomiting, or diarrhea. She states she has had kidney stones in the past but had urinary symptoms with it. Denies urinary symptoms.

## 2022-11-15 NOTE — Discharge Instructions (Addendum)
Today you are being treated for  Bacterial vaginosis and yeast  CT abdomen is negative  Take Metronidazole 500 mg twice a day for 7 days, do not drink alcohol while using medication, this will make you feel sick   Take one diflucan tablet when you receive medication then take second dose after completion of antibiotic  Bacterial vaginosis which results from an overgrowth of one on several organisms that are normally present in your vagina. Vaginosis is an inflammation of the vagina that can result in discharge, itching and pain.  Yeast infections which are caused by a naturally occurring fungus called candida. Vaginosis is an inflammation of the vagina that can result in discharge, itching and pain. The cause is usually a change in the normal balance of vaginal bacteria or an infection. Vaginosis can also result from reduced estrogen levels after menopause.   In addition: Avoid baths, hot tubs and whirlpool spas.  Don't use scented or harsh soaps Avoid irritants. These include scented tampons and pads. Wipe from front to back after using the toilet. Don't douche. Your vagina doesn't require cleansing other than normal bathing.  Use a condom.  Wear cotton underwear, this fabric absorbs some moisture.

## 2022-11-15 NOTE — ED Provider Notes (Signed)
[facility]    CSN: [id number] Arrival date & time: 11/15/22  1241      History   Chief Complaint Chief Complaint  Patient presents with   Abdominal Pain    HPI Jennifer Morse is a 32 y.o. female.   Patient presents for evaluation of right upper quadrant abdominal pain beginning at approximat in a.m.  Pain has been constant, rated 8 out of 10, described as sharp, worsened by movement.  Associated nausea but denies vomiting.  Has not attempted to eat.  Has been experiencing cough and congestion prior to new symptoms beginning.  Denies urinary or vaginal symptoms.  History of kidney stones.  Has attempted use of Motrin which has been ineffective.  Denies abdominal bloating, increased gas production, diarrhea.  Has all organs.     Past Medical History:  Diagnosis Date   Anxiety    BRCA negative 07/2021   MyRisk neg; IBIS=11.0%.riskscore=15.8%   Degenerative lumbar disc    Frequent headaches    Gestational thrombocytopenia (HCC)    Lumbar herniated disc    Migraine with aura, intractable, with status migrainosus 1995   Oligomenorrhea    Postpartum depression    with first pregnancy    Patient Active Problem List   Diagnosis Date Noted   Chronic constipation 10/19/2022   Vitamin D deficiency 10/16/2022   Anal sphincter spasm 05/24/2022   Grade II internal hemorrhoids 01/21/2022   Thyroid nodule 01/17/2022   Family history of ovarian cancer 08/02/2021   History of kidney stones 08/19/2020   Lumbar radiculopathy 05/13/2020   IUD (intrauterine device) in place 02/27/2020   Overweight (BMI 25.0-29.9) 08/06/2018   Hypothyroidism 09/08/2017   External thrombosed hemorrhoids 05/04/2017   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 Authorizing Provider  buPROPion (WELLBUTRIN XL) 300 MG 24 hr tablet Take 1 tablet (300 mg total) by mouth daily. 11/19/21  Yes Cannady, Jolene T, NP  busPIRone (BUSPAR) 7.5 MG tablet Take 1 tablet (7.5 mg total) by mouth 2 (two) times daily. 11/19/21  Yes Cannady, Jolene T, NP  gabapentin (NEURONTIN) 600 MG tablet Take 0.5 tablets (300 mg total) by mouth at bedtime. 11/19/21  Yes Cannady, Jolene T, NP  hydrOXYzine (ATARAX/VISTARIL) 25 MG tablet Take 1 tablet (25 mg total) by mouth every 6 (six) hours as needed for anxiety. 07/22/21  Yes Malachy Mood, MD  lubiprostone (AMITIZA) 24 MCG capsule Take 1 capsule (24 mcg total) by mouth daily with breakfast. 10/19/22  Yes Cannady, Jolene T, NP  meloxicam (MOBIC) 7.5 MG tablet Take 1 tablet (7.5 mg total) by mouth daily. 01/21/22  Yes Cannady, Jolene T, NP  methocarbamol (ROBAXIN) 500 MG tablet Take 1 tablet (500  mg total) by mouth every 8 (eight) hours as needed for muscle spasms. 01/21/22  Yes Cannady, Jolene T, NP  SUMAtriptan (IMITREX) 100 MG tablet Take 1 tablet (100 mg total) by mouth every 2 (two) hours as needed for migraine. May repeat in 2 hours if headache persists or recurs. 10/19/22  Yes Cannady, Henrine Screws T, NP  ALPRAZolam (XANAX) 0.5 MG tablet Take 0.5-1 tablets (0.25-0.5 mg total) by mouth 2 (two) times daily as needed (Anxiety) 04/20/22     aspirin-acetaminophen-caffeine (EXCEDRIN MIGRAINE) 250-250-65 MG tablet Take by mouth every 6 (six) hours as needed for headache.    [provider]  hydrocortisone (ANUSOL-HC) 2.5 % rectal cream Place 1 application. rectally 2 (two) times daily. 01/21/22   Marnee Guarneri T, NP  levonorgestrel (MIRENA) 20 MCG/24HR IUD 1 each by  Intrauterine route once.    [provider]  Semaglutide-Weight Management (WEGOVY) 1.7 MG/0.75ML SOAJ Inject 1.7 mg into the skin once a week. 05/27/22   Cannady, Henrine Screws T, NP  traZODone (DESYREL) 50 MG tablet Take 1 tablet (50 mg total) by mouth at bedtime as needed for sleep. 07/22/21   Malachy Mood, MD  azelastine (ASTELIN) 0.1 % nasal spray Place 1 spray into both nostrils 2 (two) times daily. Use in each nostril as directed Patient taking differently: No sig reported 07/31/18 05/04/20  Darlin Priestly, PA-C  phentermine 37.5 MG capsule Take 1 capsule (37.5 mg total) by mouth every morning. 06/22/20 11/27/20  Diona Fanti, CNM    Family History Family History  Problem Relation Age of Onset   Diabetes Mother    Diabetes Sister    Diabetes Maternal Grandmother    Heart disease Maternal Grandmother    Hypertension Maternal Grandmother    Kidney cancer Maternal Grandmother    Diabetes Maternal Grandfather    Heart disease Maternal Grandfather    Hypertension Maternal Grandfather    Diabetes Maternal Aunt    Breast cancer Neg Hx    Ovarian cancer Neg Hx    Colon cancer Neg Hx     Social History Social History   Tobacco Use   Smoking status: Former    Packs/day: 0.25    Types: Cigarettes    Quit date: 02/26/2015    Years since quitting: 7.7   Smokeless tobacco: Never  Vaping Use   Vaping Use: Never used  Substance Use Topics   Alcohol use: Yes    Alcohol/week: 1.0 standard drink of alcohol    Types: 1 Glasses of wine per week   Drug use: No     Allergies   Patient has no known allergies.   Review of Systems Review of Systems  Constitutional: Negative.   HENT: Negative.    Respiratory: Negative.    Cardiovascular: Negative.   Gastrointestinal:  Positive for abdominal pain and nausea. Negative for abdominal distention, anal bleeding, blood in stool, constipation, diarrhea, rectal pain and vomiting.  Skin: Negative.      Physical  Exam Triage Vital Signs ED Triage Vitals  Enc Vitals Group     BP 11/15/22 1339 109/71     Pulse Rate 11/15/22 1339 73     Resp 11/15/22 1339 16     Temp 11/15/22 1339 98 F (36.7 C)     Temp Source 11/15/22 1339 Oral     SpO2 11/15/22 1339 99 %     Weight 11/15/22 1336 153 lb 3.5 oz (69.5 kg)     Height 11/15/22 1336 '5\' 7"'$  (1.702 m)  Head Circumference --      Peak Flow --      Pain Score 11/15/22 1336 8     Pain Loc --      Pain Edu? --      Excl. in [place]? --    No data found.  Updated Vital Signs BP 109/71 (BP Location: Right Arm)   Pulse 73   Temp 98 F (36.7 C) (Oral)   Resp 16   Ht '5\' 7"'$  (1.702 m)   Wt 153 lb 3.5 oz (69.5 kg)   SpO2 99%   BMI 24.00 kg/m   Visual Acuity Right Eye Distance:   Left Eye Distance:   Bilateral Distance:    Right Eye Near:   Left Eye Near:    Bilateral Near:     Physical Exam Constitutional:      Appearance: Normal appearance. She is well-developed.  Eyes:     Extraocular Movements: Extraocular movements intact.  Pulmonary:     Effort: Pulmonary effort is normal.  Abdominal:     General: Abdomen is flat. Bowel sounds are normal.     Palpations: Abdomen is soft.     Tenderness: There is abdominal tenderness in the right upper quadrant.  Neurological:     Mental Status: She is alert and oriented to person, place, and time. Mental status is at baseline.      UC Treatments / Results  Labs (all labs ordered are listed, but only abnormal results are displayed) Labs Reviewed  WET PREP, GENITAL - Abnormal; Notable for the following components:      Result Value   Yeast Wet Prep HPF POC PRESENT (*)    Clue Cells Wet Prep HPF POC PRESENT (*)    WBC, Wet Prep HPF POC >10 (*)    All other components within normal limits  URINALYSIS, ROUTINE W REFLEX MICROSCOPIC - Abnormal; Notable for the following components:   Leukocytes,Ua SMALL (*)    All other components within normal limits  URINALYSIS, MICROSCOPIC (REFLEX) -  Abnormal; Notable for the following components:   Bacteria, UA MANY (*)    All other components within normal limits    EKG   Radiology No results found.  Procedures Procedures (including critical care time)  Medications Ordered in UC Medications - No data to display  Initial Impression / Assessment and Plan / UC Course  I have reviewed the triage vital signs and the nursing notes.  Pertinent labs & imaging results that were available during my care of the patient were reviewed by me and considered in my medical decision making (see chart for details).  Bacterial vaginosis, yeast vaginitis  Confirmed on wet prep, urinalysis negative, CT of the abdomen negative, completed due to to significant pain to the right upper quadrant on exam, prescribed metronidazole and Diflucan, discussed administration, Toradol injection given in office, given written handout of additional supportive measures, may follow-up with urgent care if symptoms persist or worsen Final Clinical Impressions(s) / UC Diagnoses   Final diagnoses:  None   Discharge Instructions   None    ED Prescriptions   None    PDMP not reviewed this encounter.   Hans [place], [place] 11/15/22 [phone]

## 2022-11-16 ENCOUNTER — Other Ambulatory Visit: Payer: Self-pay

## 2022-11-16 ENCOUNTER — Ambulatory Visit (INDEPENDENT_AMBULATORY_CARE_PROVIDER_SITE_OTHER): Payer: 59 | Admitting: Nurse Practitioner

## 2022-11-16 ENCOUNTER — Encounter: Payer: Self-pay | Admitting: Nurse Practitioner

## 2022-11-16 VITALS — BP 92/59 | HR 65 | Temp 97.8°F | Ht 67.99 in | Wt 155.6 lb

## 2022-11-16 DIAGNOSIS — K601 Chronic anal fissure: Secondary | ICD-10-CM | POA: Diagnosis not present

## 2022-11-16 DIAGNOSIS — K5909 Other constipation: Secondary | ICD-10-CM

## 2022-11-16 MED ORDER — LUBIPROSTONE 24 MCG PO CAPS
24.0000 ug | ORAL_CAPSULE | Freq: Two times a day (BID) | ORAL | 4 refills | Status: DC
  Filled 2022-11-16 – 2022-11-17 (×2): qty 180, 90d supply, fill #0
  Filled 2023-08-01: qty 180, 90d supply, fill #1
  Filled 2023-11-03: qty 180, 90d supply, fill #2

## 2022-11-16 NOTE — Assessment & Plan Note (Signed)
Chronic, return to general surgery as needed.  Currently working on regulating bowel pattern to 2 BM a day as general surgery recommended to her.  Refer to constipation plan of care.

## 2022-11-16 NOTE — Progress Notes (Signed)
BP (!) 92/59   Pulse 65   Temp 97.8 F (36.6 C) (Oral)   Ht 5' 7.99" (1.727 m)   Wt 155 lb 9.6 oz (70.6 kg)   SpO2 97%   BMI 23.66 kg/m    Subjective:    Patient ID: Jennifer Morse, female    DOB: 1991/06/10, 32 y.o.   MRN: [id number]  HPI: Jennifer Morse is a 32 y.o. female  Chief Complaint  Patient presents with   Constipation    Follow up   CONSTIPATION Started on Lubiprostone last visit, a GI doctor at work recommend taking twice a day this has worked better for her, was ordered once a day. Chronic anal fissure, saw general surgery on 05/24/22 -- was told that needs to have 2 bowel movements a day prior to any surgery being performed.    Currently having BM once a day. Duration:months Episode duration:  Alleviating factors: Lubiprostone Aggravating factors: nothing Status: better Treatments attempted: OTC medications Fever: no Nausea: a little yesterday with illness Vomiting: no Weight loss: no Decreased appetite: no Diarrhea: no Constipation: no Blood in stool: no Heartburn: no Jaundice: no  Relevant past medical, surgical, family and social history reviewed and updated as indicated. Interim medical history since our last visit reviewed. Allergies and medications reviewed and updated.  Review of Systems  Constitutional:  Negative for activity change, appetite change, diaphoresis, fatigue and fever.  Respiratory:  Negative for cough, chest tightness and shortness of breath.   Cardiovascular:  Negative for chest pain, palpitations and leg swelling.  Gastrointestinal: Negative.   Endocrine: Negative.   Neurological: Negative.   Psychiatric/Behavioral: Negative.     Per HPI unless specifically indicated above     Objective:    BP (!) 92/59   Pulse 65   Temp 97.8 F (36.6 C) (Oral)   Ht 5' 7.99" (1.727 m)   Wt 155 lb 9.6 oz (70.6 kg)   SpO2 97%   BMI 23.66 kg/m   Wt Readings from Last 3 Encounters:  11/16/22 155 lb 9.6 oz (70.6 kg)   11/15/22 153 lb 3.5 oz (69.5 kg)  10/19/22 153 lb 4.8 oz (69.5 kg)    Physical Exam Vitals and nursing note reviewed.  Constitutional:      General: She is awake. She is not in acute distress.    Appearance: She is well-developed, well-groomed and overweight. She is not ill-appearing or toxic-appearing.  HENT:     Head: Normocephalic.     Right Ear: Hearing normal.     Left Ear: Hearing normal.  Eyes:     General: Lids are normal.        Right eye: No discharge.        Left eye: No discharge.     Conjunctiva/sclera: Conjunctivae normal.     Pupils: Pupils are equal, round, and reactive to light.  Neck:     Thyroid: No thyromegaly.     Vascular: No carotid bruit.  Cardiovascular:     Rate and Rhythm: Normal rate and regular rhythm.     Heart sounds: Normal heart sounds. No murmur heard.    No gallop.  Pulmonary:     Effort: Pulmonary effort is normal. No accessory muscle usage or respiratory distress.     Breath sounds: Normal breath sounds.  Abdominal:     General: Bowel sounds are normal.     Palpations: Abdomen is soft. There is no hepatomegaly or splenomegaly.  Musculoskeletal:     Cervical back:  Normal range of motion and neck supple.     Right lower leg: No edema.     Left lower leg: No edema.  Lymphadenopathy:     Cervical: No cervical adenopathy.  Skin:    General: Skin is warm and dry.  Neurological:     Mental Status: She is alert and oriented to person, place, and time.     Deep Tendon Reflexes: Reflexes are normal and symmetric.     Reflex Scores:      Brachioradialis reflexes are 2+ on the right side and 2+ on the left side.      Patellar reflexes are 2+ on the right side and 2+ on the left side. Psychiatric:        Attention and Perception: Attention normal.        Mood and Affect: Mood normal.        Speech: Speech normal.        Behavior: Behavior normal. Behavior is cooperative.        Thought Content: Thought content normal.    Results for  orders placed or performed during the hospital encounter of 11/15/22  Wet prep, genital  Result Value Ref Range   Yeast Wet Prep HPF POC PRESENT (A) NONE SEEN   Trich, Wet Prep NONE SEEN NONE SEEN   Clue Cells Wet Prep HPF POC PRESENT (A) NONE SEEN   WBC, Wet Prep HPF POC >10 (A) <10   Sperm NONE SEEN   Urinalysis, Routine w reflex microscopic -Urine, Clean Catch  Result Value Ref Range   Color, Urine YELLOW YELLOW   APPearance CLEAR CLEAR   Specific Gravity, Urine 1.025 1.005 - 1.030   pH 6.5 5.0 - 8.0   Glucose, UA NEGATIVE NEGATIVE mg/dL   Hgb urine dipstick NEGATIVE NEGATIVE   Bilirubin Urine NEGATIVE NEGATIVE   Ketones, ur NEGATIVE NEGATIVE mg/dL   Protein, ur NEGATIVE NEGATIVE mg/dL   Nitrite NEGATIVE NEGATIVE   Leukocytes,Ua SMALL (A) NEGATIVE  Urinalysis, Microscopic (reflex)  Result Value Ref Range   RBC / HPF NONE SEEN 0 - 5 RBC/hpf   WBC, UA 0-5 0 - 5 WBC/hpf   Bacteria, UA MANY (A) NONE SEEN   Squamous Epithelial / HPF 0-5 0 - 5 /HPF   Budding Yeast PRESENT       Assessment & Plan:   Problem List Items Addressed This Visit       Digestive   Chronic anal fissure    Chronic, return to general surgery as needed.  Currently working on regulating bowel pattern to 2 BM a day as general surgery recommended to her.  Refer to constipation plan of care.      Chronic constipation - Primary    Chronic and ongoing.  Currently working on regulating bowel pattern to 2 BM a day as general surgery recommended to her.  She has tried multiple over the counter medications.  Will continue Lubiprostone 24 MCG BID, which is offering benefit.  Refills sent in.        Follow up plan: Return in about 6 months (around 05/19/2023) for [place].

## 2022-11-16 NOTE — Assessment & Plan Note (Signed)
Chronic and ongoing.  Currently working on regulating bowel pattern to 2 BM a day as general surgery recommended to her.  She has tried multiple over the counter medications.  Will continue Lubiprostone 24 MCG BID, which is offering benefit.  Refills sent in.

## 2022-11-17 ENCOUNTER — Other Ambulatory Visit: Payer: Self-pay

## 2022-11-21 ENCOUNTER — Encounter: Payer: Self-pay | Admitting: Nurse Practitioner

## 2022-11-22 ENCOUNTER — Encounter: Payer: Self-pay | Admitting: Unknown Physician Specialty

## 2022-11-22 ENCOUNTER — Other Ambulatory Visit: Payer: Self-pay

## 2022-11-22 ENCOUNTER — Telehealth (INDEPENDENT_AMBULATORY_CARE_PROVIDER_SITE_OTHER): Payer: 59 | Admitting: Unknown Physician Specialty

## 2022-11-22 DIAGNOSIS — R051 Acute cough: Secondary | ICD-10-CM | POA: Diagnosis not present

## 2022-11-22 MED ORDER — BUDESONIDE-FORMOTEROL FUMARATE 160-4.5 MCG/ACT IN AERO
2.0000 | INHALATION_SPRAY | RESPIRATORY_TRACT | 3 refills | Status: AC | PRN
  Filled 2022-11-22: qty 10.2, 30d supply, fill #0

## 2022-11-22 NOTE — Progress Notes (Unsigned)
There were no vitals taken for this visit.   Subjective:    Patient ID: Jennifer Morse, female    DOB: 1991/01/27, 32 y.o.   MRN: [id number]  HPI: Jennifer Morse is a 32 y.o. female  Chief Complaint  Patient presents with   Cough    With some sinus pressure, seen [facility] while out of town and was prescribed Amoxicillin, prednisone and benzonatate perles.   This visit was completed via audio and visual contact via [place]. All issues as above were discussed and addressed. Physical exam was done as above through visual confirmation on [facility]. If it was felt that the patient should be evaluated in the office, they were directed there. The patient verbally consented to this visit. Location of the patient: in car Location of the provider: work I verified patient identity using two factors (patient name and date of birth). Patient consents verbally to being seen via telemedicine visit today.  Cough Chronicity: x 1-1/2 weeks. Progression since onset: Given tessallon perles and Axil. The problem occurs constantly. The cough is Non-productive. Pertinent negatives include no chest pain, chills, ear congestion, ear pain, fever, headaches, heartburn, hemoptysis, myalgias, nasal congestion, postnasal drip, rash, rhinorrhea, sore throat, shortness of breath, sweats, weight loss or wheezing. Exacerbated by: overheated and lying down. Treatments tried: Received Amoxil, prednisone, and tessalon perles at urgent care.    Relevant past medical, surgical, family and social history reviewed and updated as indicated. Interim medical history since our last visit reviewed. Allergies and medications reviewed and updated.  Review of Systems  Constitutional:  Negative for chills, fever and weight loss.  HENT:  Negative for ear pain, postnasal drip, rhinorrhea and sore throat.   Respiratory:  Positive for cough. Negative for hemoptysis, shortness of breath and wheezing.   Cardiovascular:   Negative for chest pain.  Gastrointestinal:  Negative for heartburn.  Musculoskeletal:  Negative for myalgias.  Skin:  Negative for rash.  Neurological:  Negative for headaches.    Per HPI unless specifically indicated above     Objective:    There were no vitals taken for this visit.  Wt Readings from Last 3 Encounters:  11/16/22 155 lb 9.6 oz (70.6 kg)  11/15/22 153 lb 3.5 oz (69.5 kg)  10/19/22 153 lb 4.8 oz (69.5 kg)    Physical Exam Constitutional:      General: She is not in acute distress.    Appearance: Normal appearance. She is well-developed.  HENT:     Head: Normocephalic and atraumatic.  Eyes:     General: Lids are normal. No scleral icterus.       Right eye: No discharge.        Left eye: No discharge.     Conjunctiva/sclera: Conjunctivae normal.  Cardiovascular:     Rate and Rhythm: Normal rate.  Pulmonary:     Effort: Pulmonary effort is normal.  Abdominal:     Palpations: There is no hepatomegaly or splenomegaly.  Musculoskeletal:        General: Normal range of motion.  Skin:    Coloration: Skin is not pale.     Findings: No rash.  Neurological:     Mental Status: She is alert and oriented to person, place, and time.  Psychiatric:        Behavior: Behavior normal.        Thought Content: Thought content normal.        Judgment: Judgment normal.     Results for orders placed or  performed during the hospital encounter of 11/15/22  Wet prep, genital  Result Value Ref Range   Yeast Wet Prep HPF POC PRESENT (A) NONE SEEN   Trich, Wet Prep NONE SEEN NONE SEEN   Clue Cells Wet Prep HPF POC PRESENT (A) NONE SEEN   WBC, Wet Prep HPF POC >10 (A) <10   Sperm NONE SEEN   Urinalysis, Routine w reflex microscopic -Urine, Clean Catch  Result Value Ref Range   Color, Urine YELLOW YELLOW   APPearance CLEAR CLEAR   Specific Gravity, Urine 1.025 1.005 - 1.030   pH 6.5 5.0 - 8.0   Glucose, UA NEGATIVE NEGATIVE mg/dL   Hgb urine dipstick NEGATIVE NEGATIVE    Bilirubin Urine NEGATIVE NEGATIVE   Ketones, ur NEGATIVE NEGATIVE mg/dL   Protein, ur NEGATIVE NEGATIVE mg/dL   Nitrite NEGATIVE NEGATIVE   Leukocytes,Ua SMALL (A) NEGATIVE  Urinalysis, Microscopic (reflex)  Result Value Ref Range   RBC / HPF NONE SEEN 0 - 5 RBC/hpf   WBC, UA 0-5 0 - 5 WBC/hpf   Bacteria, UA MANY (A) NONE SEEN   Squamous Epithelial / HPF 0-5 0 - 5 /HPF   Budding Yeast PRESENT       Assessment & Plan:   Problem List Items Addressed This Visit   None Visit Diagnoses     Acute cough    -  Primary   Post viral cough. Feels better but cough persistant.  Rx for Symbicort prn not to exceed 12 puffs/day.  Instructions on use to ask pharm or an industry [employer]        Follow up plan: Return if symptoms worsen or fail to improve.

## 2022-12-08 ENCOUNTER — Other Ambulatory Visit: Payer: Self-pay

## 2022-12-08 ENCOUNTER — Other Ambulatory Visit: Payer: Self-pay | Admitting: Nurse Practitioner

## 2022-12-08 MED ORDER — WEGOVY 1.7 MG/0.75ML ~~LOC~~ SOAJ
1.7000 mg | SUBCUTANEOUS | 1 refills | Status: DC
  Filled 2022-12-08: qty 3, 28d supply, fill #0
  Filled 2023-01-18: qty 3, 28d supply, fill #1

## 2022-12-08 NOTE — Telephone Encounter (Signed)
Requested Prescriptions  Pending Prescriptions Disp Refills   Semaglutide-Weight Management (WEGOVY) 1.7 MG/0.75ML SOAJ 3 mL 1    Sig: Inject 1.7 mg into the skin once a week.     Endocrinology:  Diabetes - GLP-1 Receptor Agonists - semaglutide Failed - 12/08/2022  7:42 AM      Failed - HBA1C in normal range and within 180 days    Hgb A1c MFr Bld  Date Value Ref Range Status  08/13/2021 5.5 4.8 - 5.6 % Final    Comment:             Prediabetes: 5.7 - 6.4          Diabetes: >6.4          Glycemic control for adults with diabetes: <7.0          Failed - Cr in normal range and within 360 days    Creat  Date Value Ref Range Status  05/13/2020 1.02 0.50 - 1.10 mg/dL Final   Creatinine, Ser  Date Value Ref Range Status  10/19/2022 1.04 (H) 0.57 - 1.00 mg/dL Final         Passed - Valid encounter within last 6 months    Recent Outpatient Visits           2 weeks ago Acute cough   [place] Kathrine Haddock, NP   3 weeks ago Chronic constipation   [place], Henrine Screws T, NP   1 month ago Hypothyroidism, unspecified type   [place], Barbaraann Faster, NP   3 months ago [place], [place] T, NP   6 months ago Anxiety and depression   [place], Barbaraann Faster, NP

## 2023-01-12 ENCOUNTER — Other Ambulatory Visit: Payer: Self-pay

## 2023-01-18 ENCOUNTER — Other Ambulatory Visit: Payer: Self-pay | Admitting: Nurse Practitioner

## 2023-01-19 ENCOUNTER — Other Ambulatory Visit: Payer: Self-pay

## 2023-01-19 ENCOUNTER — Other Ambulatory Visit: Payer: Self-pay | Admitting: Nurse Practitioner

## 2023-01-19 MED FILL — Bupropion HCl Tab ER 24HR 300 MG: ORAL | 90 days supply | Qty: 90 | Fill #0 | Status: AC

## 2023-01-19 MED FILL — Buspirone HCl Tab 7.5 MG: ORAL | 90 days supply | Qty: 180 | Fill #0 | Status: AC

## 2023-01-19 MED FILL — Gabapentin Tab 600 MG: ORAL | 90 days supply | Qty: 45 | Fill #0 | Status: AC

## 2023-01-19 NOTE — Telephone Encounter (Signed)
Requested medication (s) are due for refill today - expired Rx  Requested medication (s) are on the active medication list -yes  Future visit scheduled -no  Last refill: all- 11/19/21 - 1 year Rx  Notes to clinic: expired Rx- last OV 10/19/22  Requested Prescriptions  Pending Prescriptions Disp Refills   buPROPion (WELLBUTRIN XL) 300 MG 24 hr tablet 90 tablet 4    Sig: Take 1 tablet (300 mg total) by mouth daily.     Psychiatry: Antidepressants - bupropion Failed - 01/19/2023  8:18 AM      Failed - Cr in normal range and within 360 days    Creat  Date Value Ref Range Status  05/13/2020 1.02 0.50 - 1.10 mg/dL Final   Creatinine, Ser  Date Value Ref Range Status  10/19/2022 1.04 (H) 0.57 - 1.00 mg/dL Final         Passed - AST in normal range and within 360 days    AST  Date Value Ref Range Status  10/19/2022 14 0 - 40 IU/L Final         Passed - ALT in normal range and within 360 days    ALT  Date Value Ref Range Status  10/19/2022 15 0 - 32 IU/L Final         Passed - Completed PHQ-2 or PHQ-9 in the last 360 days      Passed - Last BP in normal range    BP Readings from Last 1 Encounters:  11/16/22 (!) 92/59         Passed - Valid encounter within last 6 months    Recent Outpatient Visits           1 month ago Acute cough   [facility] Gabriel Cirri, NP   2 months ago Chronic constipation   [place] [facility] [place], Dawson T, NP   3 months ago Hypothyroidism, unspecified type   [place] [facility] [place], [place] T, NP   4 months ago Influenza B   [place] [facility] [place], [place] T, NP   7 months ago Anxiety and depression   [place] [facility] [place], [place] T, NP               busPIRone (BUSPAR) 7.5 MG tablet 180 tablet 4    Sig: Take 1 tablet (7.5 mg total) by mouth 2 (two) times daily.     Psychiatry: Anxiolytics/Hypnotics - Non-controlled Passed  - 01/19/2023  8:18 AM      Passed - Valid encounter within last 12 months    Recent Outpatient Visits           1 month ago Acute cough   [place] [facility] Gabriel Cirri, NP   2 months ago Chronic constipation   [place] [facility] [place], Foster T, NP   3 months ago Hypothyroidism, unspecified type   [place] [facility] [place], Dorie Rank, NP   4 months ago Influenza B   [place] [facility] [place], [place] T, NP   7 months ago Anxiety and depression    [facility] [place], [place] T, NP               gabapentin (NEURONTIN) 600 MG tablet 45 tablet 4    Sig: Take 0.5 tablets (300 mg total) by mouth at bedtime.     Neurology: Anticonvulsants - gabapentin Failed - 01/19/2023  8:18 AM      Failed - Cr in normal range and within 360 days    Creat  Date Value Ref Range Status  05/13/2020 1.02 0.50 - 1.10 mg/dL Final   Creatinine, Ser  Date Value Ref Range Status  10/19/2022 1.04 (H) 0.57 - 1.00 mg/dL Final         Passed - Completed PHQ-2 or PHQ-9 in the last 360 days      Passed - Valid encounter within last 12 months    Recent Outpatient Visits           1 month ago Acute cough   [place] [facility] Gabriel Cirri, NP   2 months ago Chronic constipation   [place] [facility] [place], [place] T, NP   3 months ago Hypothyroidism, unspecified type   [place] [facility] [place], Dorie Rank, NP   4 months ago Influenza B   [place] [facility] [place], [place] T, NP   7 months ago Anxiety and depression   [place] [facility] [place], [place] T, NP                 Requested Prescriptions  Pending Prescriptions Disp Refills   buPROPion (WELLBUTRIN XL) 300 MG 24 hr tablet 90 tablet 4    Sig: Take 1 tablet (300 mg total) by mouth daily.     Psychiatry: Antidepressants - bupropion Failed -  01/19/2023  8:18 AM      Failed - Cr in normal range and within 360 days    Creat  Date Value Ref Range Status  05/13/2020 1.02 0.50 - 1.10 mg/dL Final   Creatinine, Ser  Date Value Ref Range Status  10/19/2022 1.04 (H) 0.57 - 1.00 mg/dL Final         Passed - AST in normal range and within 360 days    AST  Date Value Ref Range Status  10/19/2022 14 0 - 40 IU/L Final         Passed - ALT in normal range and within 360 days    ALT  Date Value Ref Range Status  10/19/2022 15 0 - 32 IU/L Final         Passed - Completed PHQ-2 or PHQ-9 in the last 360 days      Passed - Last BP in normal range    BP Readings from Last 1 Encounters:  11/16/22 (!) 92/59         Passed - Valid encounter within last 6 months    Recent Outpatient Visits           1 month ago Acute cough   [place] [facility] Gabriel Cirri, NP   2 months ago Chronic constipation   [facility] [place], [place] T, NP   3 months ago Hypothyroidism, unspecified type   [place] [facility] [place], [place] T, NP   4 months ago Influenza B   [place] [facility] [place], [place] T, NP   7 months ago Anxiety and depression   [place] [facility] [place], [place] T, NP               busPIRone (BUSPAR) 7.5 MG tablet 180 tablet 4    Sig: Take 1 tablet (7.5 mg total) by mouth 2 (two) times daily.     Psychiatry: Anxiolytics/Hypnotics - Non-controlled Passed - 01/19/2023  8:18 AM  Passed - Valid encounter within last 12 months    Recent Outpatient Visits           1 month ago Acute cough   [place] [facility] Gabriel Cirri, NP   2 months ago Chronic constipation   [place] [facility] [place], [place] T, NP   3 months ago Hypothyroidism, unspecified type   [place] [facility] [place], Dorie Rank, NP   4 months ago Influenza B   [place] [facility]  [place], [place] T, NP   7 months ago Anxiety and depression   [place] [facility] [place], [place] T, NP               gabapentin (NEURONTIN) 600 MG tablet 45 tablet 4    Sig: Take 0.5 tablets (300 mg total) by mouth at bedtime.     Neurology: Anticonvulsants - gabapentin Failed - 01/19/2023  8:18 AM      Failed - Cr in normal range and within 360 days    Creat  Date Value Ref Range Status  05/13/2020 1.02 0.50 - 1.10 mg/dL Final   Creatinine, Ser  Date Value Ref Range Status  10/19/2022 1.04 (H) 0.57 - 1.00 mg/dL Final         Passed - Completed PHQ-2 or PHQ-9 in the last 360 days      Passed - Valid encounter within last 12 months    Recent Outpatient Visits           1 month ago Acute cough   [facility] Gabriel Cirri, NP   2 months ago Chronic constipation   [place] [facility] [place], [place] T, NP   3 months ago Hypothyroidism, unspecified type   [place] [facility] [place], Dorie Rank, NP   4 months ago Influenza B   [place] [facility] [place], [place] T, NP   7 months ago Anxiety and depression   [place] [facility] [place], Dorie Rank, NP

## 2023-01-20 ENCOUNTER — Other Ambulatory Visit: Payer: Self-pay

## 2023-03-29 ENCOUNTER — Other Ambulatory Visit: Payer: Self-pay

## 2023-03-30 ENCOUNTER — Other Ambulatory Visit: Payer: Self-pay

## 2023-04-04 DIAGNOSIS — L821 Other seborrheic keratosis: Secondary | ICD-10-CM | POA: Diagnosis not present

## 2023-04-04 DIAGNOSIS — L814 Other melanin hyperpigmentation: Secondary | ICD-10-CM | POA: Diagnosis not present

## 2023-04-04 DIAGNOSIS — D2361 Other benign neoplasm of skin of right upper limb, including shoulder: Secondary | ICD-10-CM | POA: Diagnosis not present

## 2023-04-25 ENCOUNTER — Other Ambulatory Visit: Payer: Self-pay

## 2023-05-12 ENCOUNTER — Ambulatory Visit: Payer: 59 | Admitting: Nurse Practitioner

## 2023-05-15 ENCOUNTER — Ambulatory Visit
Admission: EM | Admit: 2023-05-15 | Discharge: 2023-05-15 | Disposition: A | Discharge status: Home / Self Care | Payer: 59 | Attending: Emergency Medicine | Admitting: Emergency Medicine

## 2023-05-15 ENCOUNTER — Encounter: Payer: Self-pay | Admitting: Emergency Medicine

## 2023-05-15 DIAGNOSIS — Z1152 Encounter for screening for COVID-19: Secondary | ICD-10-CM | POA: Insufficient documentation

## 2023-05-15 DIAGNOSIS — B349 Viral infection, unspecified: Secondary | ICD-10-CM

## 2023-05-15 LAB — RESP PANEL BY RT-PCR (RSV, FLU A&B, COVID)  RVPGX2
Influenza A by PCR: NEGATIVE
Influenza B by PCR: NEGATIVE
Resp Syncytial Virus by PCR: NEGATIVE
SARS Coronavirus 2 by RT PCR: NEGATIVE

## 2023-05-15 LAB — GROUP A STREP BY PCR: Group A Strep by PCR: NOT DETECTED

## 2023-05-15 MED ORDER — IBUPROFEN 600 MG PO TABS
600.0000 mg | ORAL_TABLET | Freq: Once | ORAL | Status: AC
  Administered 2023-05-15: 600 mg via ORAL

## 2023-05-15 NOTE — Discharge Instructions (Addendum)
Your respiratory panel and strep test are both negative. Most likely you have a viral illness: no antibiotic is indicated at this time, May treat with OTC meds of choice(tylenol,ibuprofen, chloraseptic throat lozenges,etc as label directed). Make sure to drink plenty of fluids to stay hydrated(gatorade, water, popsicles,jello,etc), avoid caffeine products. Follow up with PCP. Go to Er if headache does not improve or you have worsening symptoms.

## 2023-05-15 NOTE — ED Provider Notes (Signed)
[facility]    CSN: [id number] Arrival date & time: 05/15/23  1610      History   Chief Complaint Chief Complaint  Patient presents with   Fever   Headache   Generalized Body Aches   Nasal Congestion    HPI Jennifer Morse is a 32 y.o. female.   32 year old female, Jennifer Morse, presents to urgent care for evaluation of nasal congestion, bodyaches, headache and cough that started yesterday. Took Excedrin for symptoms today.   The history is provided by the patient. No language interpreter was used.    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/24/2022   Grade II internal hemorrhoids 01/21/2022   Thyroid nodule 01/17/2022   Family history of ovarian cancer 08/02/2021   History of kidney stones 08/19/2020   Lumbar radiculopathy 05/13/2020   IUD (intrauterine device) in place 02/27/2020   Overweight (BMI 25.0-29.9) 08/06/2018   Hypothyroidism 09/08/2017   External thrombosed hemorrhoids 05/04/2017   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 Authorizing Provider  ALPRAZolam Prudy Feeler) 0.5 MG tablet Take 0.5-1 tablets (0.25-0.5 mg total) by mouth 2 (two) times daily as needed (Anxiety) 04/20/22     aspirin-acetaminophen-caffeine (EXCEDRIN MIGRAINE) 250-250-65 MG tablet Take by mouth every 6 (six) hours as needed for headache.    [provider]  budesonide-formoterol (SYMBICORT) 160-4.5 MCG/ACT inhaler Inhale 2 puffs into the lungs as needed (Do not take over 12 puffs/day). 11/22/22   Gabriel Cirri, NP  buPROPion (WELLBUTRIN XL) 300 MG 24 hr tablet Take 1 tablet (300 mg total) by mouth daily. 01/19/23   Cannady, Corrie Dandy T, NP  busPIRone (BUSPAR) 7.5 MG tablet Take 1 tablet (7.5 mg total) by mouth 2 (two) times daily. 01/19/23   Cannady, Corrie Dandy T, NP  gabapentin (NEURONTIN) 600 MG tablet Take 0.5 tablets (300 mg total) by mouth at bedtime. 01/19/23   Cannady, Corrie Dandy T, NP  hydrocortisone (ANUSOL-HC) 2.5 % rectal cream Place 1 application. rectally 2 (two) times daily. 01/21/22   Cannady, Corrie Dandy T, NP  hydrOXYzine (ATARAX/VISTARIL) 25 MG tablet Take 1 tablet (25 mg total) by mouth every  6 (six) hours as needed for anxiety. 07/22/21   Vena Austria, MD  levonorgestrel (MIRENA) 20 MCG/24HR IUD 1 each by Intrauterine route once.    [provider]  lubiprostone (AMITIZA) 24 MCG capsule Take 1 capsule (24 mcg total) by mouth 2 (two) times daily with a meal. 11/16/22   Cannady, Jolene T, NP  SUMAtriptan (IMITREX) 100 MG tablet Take 1 tablet (100 mg total) by mouth every 2 (two) hours as needed for migraine. May repeat in 2 hours if headache persists or recurs. 10/19/22   Cannady, Corrie Dandy T, NP  azelastine (ASTELIN) 0.1 % nasal spray Place 1 spray into both nostrils 2 (two) times daily. Use in each nostril as directed Patient taking differently: No sig reported 07/31/18 05/04/20  Janalyn Harder, PA-C  phentermine 37.5 MG  capsule Take 1 capsule (37.5 mg total) by mouth every morning. 06/22/20 11/27/20  Gunnar Bulla, CNM    Family History Family History  Problem Relation Age of Onset   Diabetes Mother    Diabetes Sister    Diabetes Maternal Grandmother    Heart disease Maternal Grandmother    Hypertension Maternal Grandmother    Kidney cancer Maternal Grandmother    Diabetes Maternal Grandfather    Heart disease Maternal Grandfather    Hypertension Maternal Grandfather    Diabetes Maternal Aunt    Breast cancer Neg Hx    Ovarian cancer Neg Hx    Colon cancer Neg Hx     Social History Social History   Tobacco Use   Smoking status: Former    Current packs/day: 0.00    Types: Cigarettes    Quit date: 02/26/2015    Years since quitting: 8.2   Smokeless tobacco: Never  Vaping Use   Vaping status: Never Used  Substance Use Topics   Alcohol use: Yes    Alcohol/week: 1.0 standard drink of alcohol    Types: 1 Glasses of wine per week   Drug use: No     Allergies   Patient has no known allergies.   Review of Systems Review of Systems  Constitutional:  Positive for fever.  HENT:  Positive for congestion.   Respiratory:  Positive for cough.   Musculoskeletal:  Positive for myalgias.  Neurological:  Positive for headaches.  All other systems reviewed and are negative.    Physical Exam Triage Vital Signs ED Triage Vitals [05/15/23 1641]  Encounter Vitals Group     BP      Systolic BP Percentile      Diastolic BP Percentile      Pulse      Resp      Temp (!) 100.9 F (38.3 C)     Temp Source Oral     SpO2      Weight      Height      Head Circumference      Peak Flow      Pain Score 10     Pain Loc      Pain Education      Exclude from Growth Chart    No data found.  Updated Vital Signs BP 105/68 (BP Location: Right Arm)   Pulse 97   Temp (!) 100.9 F (38.3 C) (Oral)   Resp 20   SpO2 99%   Visual Acuity Right Eye Distance:   Left Eye Distance:    Bilateral Distance:    Right Eye Near:   Left Eye Near:    Bilateral Near:  Physical Exam Vitals and nursing note reviewed.  Constitutional:      General: She is not in acute distress.    Appearance: She is well-developed.  HENT:     Head: Normocephalic.     Right Ear: Tympanic membrane is retracted.     Left Ear: Tympanic membrane is retracted.     Nose: Congestion present.     Mouth/Throat:     Lips: Pink.     Mouth: Mucous membranes are moist.     Pharynx: Oropharynx is clear.  Eyes:     General: Lids are normal.     Extraocular Movements: Extraocular movements intact.     Conjunctiva/sclera: Conjunctivae normal.     Pupils: Pupils are equal, round, and reactive to light.  Neck:     Trachea: No tracheal deviation.  Cardiovascular:     Rate and Rhythm: Normal rate and regular rhythm.     Pulses: Normal pulses.     Heart sounds: Normal heart sounds. No murmur heard. Pulmonary:     Effort: Pulmonary effort is normal.     Breath sounds: Normal breath sounds and air entry.  Abdominal:     General: Bowel sounds are normal.     Palpations: Abdomen is soft.     Tenderness: There is no abdominal tenderness.  Musculoskeletal:        General: Normal range of motion.     Cervical back: Normal range of motion.  Lymphadenopathy:     Cervical: No cervical adenopathy.  Skin:    General: Skin is warm and dry.     Findings: No rash.  Neurological:     General: No focal deficit present.     Mental Status: She is alert and oriented to person, place, and time.     GCS: GCS eye subscore is 4. GCS verbal subscore is 5. GCS motor subscore is 6.  Psychiatric:        Speech: Speech normal.        Behavior: Behavior normal. Behavior is cooperative.      UC Treatments / Results  Labs (all labs ordered are listed, but only abnormal results are displayed) Labs Reviewed  GROUP A STREP BY PCR  RESP PANEL BY RT-PCR (RSV, FLU A&B, COVID)  [id number]    EKG   Radiology No  results found.  Procedures Procedures (including critical care time)  Medications Ordered in UC Medications  ibuprofen (ADVIL) tablet 600 mg (600 mg Oral Given 05/15/23 1721)    Initial Impression / Assessment and Plan / UC Course  I have reviewed the triage vital signs and the nursing notes.  Pertinent labs & imaging results that were available during my care of the patient were reviewed by me and considered in my medical decision making (see chart for details).  Clinical Course as of 05/15/23 2041  Mon May 15, 2023  1723 Strep negative, resp panel pending, ibuprofen 600 mg ordered for pain and low grade temp. [JD]    Clinical Course User Index [JD] Akua Blethen, Para March, NP   Discussed exam findings and plan of care with patient, strict go to ER precautions given.   Patient verbalized understanding to this provider.  Ddx: Viral illness, allergies Final Clinical Impressions(s) / UC Diagnoses   Final diagnoses:  Nonspecific syndrome suggestive of viral illness     Discharge Instructions      Your respiratory panel and strep test are both negative. Most likely you have a viral illness: no antibiotic is indicated at this  time, May treat with OTC meds of choice(tylenol,ibuprofen, chloraseptic throat lozenges,etc as label directed). Make sure to drink plenty of fluids to stay hydrated(gatorade, water, popsicles,jello,etc), avoid caffeine products. Follow up with PCP. Go to Er if headache does not improve or you have worsening symptoms.      ED Prescriptions   None    PDMP not reviewed this encounter.   Clancy Gourd, NP 05/15/23 2042

## 2023-05-15 NOTE — ED Triage Notes (Addendum)
Pt presents with nasal congestion, bodyaches, headache and cough since yesterday. Pt Excedrin for symptoms today.

## 2023-05-21 NOTE — Patient Instructions (Signed)
Migraine Headache A migraine headache is a very strong throbbing pain on one or both sides of your head. This type of headache can also cause other symptoms. It can last from 4 hours to 3 days. Talk with your doctor about what things may bring on (trigger) this condition. What are the causes? The exact cause of a migraine is not known. This condition may be brought on or caused by: Smoking. Medicines, such as: Medicine used to treat chest pain (nitroglycerin). Birth control pills. Estrogen. Some blood pressure medicines. Certain substances in some foods or drinks. Foods and drinks, such as: Cheese. Chocolate. Alcohol. Caffeine. Doing physical activity that is very hard. Other things that may trigger a migraine headache include: Periods. Pregnancy. Hunger. Stress. Getting too much or too little sleep. Weather changes. Feeling tired (fatigue). What increases the risk? Being 56-65 years old. Being female. Having a family history of migraine headaches. Being Caucasian. Having a mental health condition, such as being sad (depressed) or feeling worried or nervous (anxious). Being very overweight (obese). What are the signs or symptoms? A throbbing pain. This pain may: Happen in any area of the head, such as on one or both sides. Make it hard to do daily activities. Get worse with physical activity. Get worse around bright lights, loud noises, or smells. Other symptoms may include: Feeling like you may vomit (nauseous). Vomiting. Dizziness. Before a migraine headache starts, you may get warning signs (an aura). An aura may include: Seeing flashing lights or having blind spots. Seeing bright spots, halos, or zigzag lines. Having tunnel vision or blurred vision. Having numbness or a tingling feeling. Having trouble talking. Having weak muscles. After a migraine ends, you may have symptoms. These may include: Tiredness. Trouble thinking (concentrating). How is this  treated? Taking medicines that: Relieve pain. Relieve the feeling like you may vomit. Prevent migraine headaches. Treatment may also include: Acupuncture. Lifestyle changes like avoiding foods that bring on migraine headaches. Learning ways to control your body functions (biofeedback). Therapy to help you know and deal with negative thoughts (cognitive behavioral therapy). Follow these instructions at home: Medicines Take over-the-counter and prescription medicines only as told by your doctor. If told, take steps to prevent problems with pooping (constipation). You may need to: Drink enough fluid to keep your pee (urine) pale yellow. Take medicines. You will be told what medicines to take. Eat foods that are high in fiber. These include beans, whole grains, and fresh fruits and vegetables. Limit foods that are high in fat and sugar. These include fried or sweet foods. Ask your doctor if you should avoid driving or using machines while you are taking your medicine. Lifestyle  Do not drink alcohol. Do not smoke or use any products that contain nicotine or tobacco. If you need help quitting, ask your doctor. Get 7-9 hours of sleep each night, or the amount recommended by your doctor. Find ways to deal with stress, such as meditation, deep breathing, or yoga. Try to exercise often. This can help lessen how bad and how often your migraines happen. General instructions Keep a journal to find out what may bring on your migraine headaches. This can help you avoid those things. For example, write down: What you eat and drink. How much sleep you get. Any change to your medicines or diet. If you have a migraine headache: Avoid things that make your symptoms worse, such as bright lights. Lie down in a dark, quiet room. Do not drive or use machinery. Ask your  doctor what activities are safe for you. Where to find more information Coalition for Headache and Migraine Patients (CHAMP):  headachemigraine.org American Migraine Foundation: americanmigrainefoundation.org National Headache Foundation: headaches.org Contact a doctor if: You get a migraine headache that is different or worse than others you have had. You have more than 15 days of headaches in one month. Get help right away if: Your migraine headache gets very bad. Your migraine headache lasts more than 72 hours. You have a fever or stiff neck. You have trouble seeing. Your muscles feel weak or like you cannot control them. You lose your balance a lot. You have trouble walking. You faint. You have a seizure. This information is not intended to replace advice given to you by your health care provider. Make sure you discuss any questions you have with your health care provider. Document Revised: 04/18/2022 Document Reviewed: 04/18/2022 Elsevier Patient Education  2024 [employer].

## 2023-05-24 ENCOUNTER — Other Ambulatory Visit: Payer: Self-pay

## 2023-05-24 ENCOUNTER — Ambulatory Visit (INDEPENDENT_AMBULATORY_CARE_PROVIDER_SITE_OTHER): Payer: 59 | Admitting: Nurse Practitioner

## 2023-05-24 VITALS — BP 93/60 | HR 70 | Temp 98.5°F | Ht 68.0 in | Wt 171.8 lb

## 2023-05-24 DIAGNOSIS — G43E09 Chronic migraine with aura, not intractable, without status migrainosus: Secondary | ICD-10-CM | POA: Diagnosis not present

## 2023-05-24 MED FILL — Buspirone HCl Tab 7.5 MG: ORAL | 90 days supply | Qty: 180 | Fill #1 | Status: AC

## 2023-05-24 MED FILL — Bupropion HCl Tab ER 24HR 300 MG: ORAL | 90 days supply | Qty: 90 | Fill #1 | Status: AC

## 2023-05-24 NOTE — Progress Notes (Signed)
BP 93/60   Pulse 70   Temp 98.5 F (36.9 C) (Oral)   Ht 5\' 8"  (1.727 m)   Wt 171 lb 12.8 oz (77.9 kg)   SpO2 99%   BMI 26.12 kg/m    Subjective:    Patient ID: Jennifer Morse, female    DOB: 1990-12-06, 32 y.o.   MRN: [id number]  HPI: Jennifer Morse is a 32 y.o. female  Chief Complaint  Patient presents with   Migraine    Patient states she has had migraines all her life. States they had gotten better for a while, but feels like they have gotten worse recently. States she has been taking a lot of Excedrine migraine and states the imitrex does not help that much anymore. States she knows she cannot take propranolol because it drops her heart rate to low. Would like to discuss other options.    MIGRAINES Has had all her life, since kindergarten.  Been on medication all her life.  For awhile Imitrex helped, but not as much anymore.  Taking Excedrin migraine every day.  Can not take Propranolol as this lowered her heart rate. Tried multiple medications in past.  Has had imaging in past.  Having migraines about 5 days a week.  Currently takes Gabapentin 300 MG daily at bedtime.   Duration: chronic Onset: gradual Severity: 10/10 Quality: sharp, aching, and throbbing Frequency: intermittent Location: start frontal and will go back Headache duration: 24 hours Radiation: no Time of day headache occurs: varies Alleviating factors: nothing Aggravating factors: stress and weather changes Headache status at time of visit: asymptomatic Treatments attempted: Treatments attempted: Topamax, Propranolol, Amitriptyline, Maxalt, Excedrin, Gabapentin, Tylenol, Imitrex   Aura: yes Nausea:  yes Vomiting: yes Photophobia:  yes Phonophobia:  yes Effect on social functioning:  yes Numbers of missed days of school/work each month: 0 Confusion:  no Gait disturbance/ataxia:  no Behavioral changes:  no Fevers:  no   Relevant past medical, surgical, family and social history reviewed  and updated as indicated. Interim medical history since our last visit reviewed. Allergies and medications reviewed and updated.  Review of Systems  Constitutional:  Negative for activity change, appetite change, diaphoresis, fatigue and fever.  Respiratory:  Negative for cough, chest tightness and shortness of breath.   Cardiovascular:  Negative for chest pain, palpitations and leg swelling.  Gastrointestinal: Negative.   Neurological:  Positive for headaches. Negative for dizziness, syncope, weakness, light-headedness and numbness.  Psychiatric/Behavioral: Negative.      Per HPI unless specifically indicated above     Objective:    BP 93/60   Pulse 70   Temp 98.5 F (36.9 C) (Oral)   Ht 5\' 8"  (1.727 m)   Wt 171 lb 12.8 oz (77.9 kg)   SpO2 99%   BMI 26.12 kg/m   Wt Readings from Last 3 Encounters:  05/24/23 171 lb 12.8 oz (77.9 kg)  11/16/22 155 lb 9.6 oz (70.6 kg)  11/15/22 153 lb 3.5 oz (69.5 kg)    Physical Exam Vitals and nursing note reviewed.  Constitutional:      General: She is awake. She is not in acute distress.    Appearance: She is well-developed and well-groomed. She is not ill-appearing or toxic-appearing.  HENT:     Head: Normocephalic.     Right Ear: Hearing and external ear normal.     Left Ear: Hearing and external ear normal.  Eyes:     General: Lids are normal.  Right eye: No discharge.        Left eye: No discharge.     Conjunctiva/sclera: Conjunctivae normal.     Pupils: Pupils are equal, round, and reactive to light.  Neck:     Thyroid: No thyromegaly.     Vascular: No carotid bruit.  Cardiovascular:     Rate and Rhythm: Normal rate and regular rhythm.     Heart sounds: Normal heart sounds. No murmur heard.    No gallop.  Pulmonary:     Effort: Pulmonary effort is normal. No accessory muscle usage or respiratory distress.     Breath sounds: Normal breath sounds.  Abdominal:     General: Bowel sounds are normal. There is no  distension.     Palpations: Abdomen is soft.     Tenderness: There is no abdominal tenderness.  Musculoskeletal:     Cervical back: Normal range of motion and neck supple.     Right lower leg: No edema.     Left lower leg: No edema.  Lymphadenopathy:     Cervical: No cervical adenopathy.  Skin:    General: Skin is warm and dry.  Neurological:     Mental Status: She is alert and oriented to person, place, and time.     Cranial Nerves: Cranial nerves 2-12 are intact.     Sensory: Sensation is intact.     Motor: Motor function is intact.     Coordination: Coordination is intact.     Gait: Gait is intact.     Deep Tendon Reflexes: Reflexes are normal and symmetric.     Reflex Scores:      Brachioradialis reflexes are 2+ on the right side and 2+ on the left side.      Patellar reflexes are 2+ on the right side and 2+ on the left side. Psychiatric:        Attention and Perception: Attention normal.        Mood and Affect: Mood normal.        Speech: Speech normal.        Behavior: Behavior normal. Behavior is cooperative.        Thought Content: Thought content normal.    Results for orders placed or performed during the hospital encounter of 05/15/23  Group A Strep by PCR   Specimen: Throat; Sterile Swab  Result Value Ref Range   Group A Strep by PCR NOT DETECTED NOT DETECTED  Resp panel by RT-PCR (RSV, Flu A&B, Covid) Anterior Nasal Swab   Specimen: Anterior Nasal Swab  Result Value Ref Range   SARS Coronavirus 2 by RT PCR NEGATIVE NEGATIVE   Influenza A by PCR NEGATIVE NEGATIVE   Influenza B by PCR NEGATIVE NEGATIVE   Resp Syncytial Virus by PCR NEGATIVE NEGATIVE      Assessment & Plan:   Problem List Items Addressed This Visit       Cardiovascular and Mediastinum   Migraine - Primary    Chronic, ongoing since childhood.  Has tried multiple medications without benefit.  Would like to visit with neurology.  Imaging done in past, last appears to be 2015.  No red flags on  exam today.  Will provide samples of Nurtec to try, instructed her on this and side effects.  At this time will keep Imitrex on board.  If benefit from Nurtec she will alert PCP.      Relevant Orders   Ambulatory referral to Neurology     Follow up plan: Return  for as scheduled.

## 2023-05-24 NOTE — Assessment & Plan Note (Signed)
Chronic, ongoing since childhood.  Has tried multiple medications without benefit.  Would like to visit with neurology.  Imaging done in past, last appears to be 2015.  No red flags on exam today.  Will provide samples of Nurtec to try, instructed her on this and side effects.  At this time will keep Imitrex on board.  If benefit from Nurtec she will alert PCP.

## 2023-08-01 MED FILL — Buspirone HCl Tab 7.5 MG: ORAL | 90 days supply | Qty: 180 | Fill #2 | Status: AC

## 2023-08-01 MED FILL — Gabapentin Tab 600 MG: ORAL | 90 days supply | Qty: 45 | Fill #1 | Status: AC

## 2023-08-01 MED FILL — Bupropion HCl Tab ER 24HR 300 MG: ORAL | 90 days supply | Qty: 90 | Fill #2 | Status: AC

## 2023-08-02 ENCOUNTER — Other Ambulatory Visit: Payer: Self-pay

## 2023-08-08 ENCOUNTER — Other Ambulatory Visit: Payer: Self-pay

## 2023-08-11 ENCOUNTER — Encounter: Payer: Self-pay | Admitting: Nurse Practitioner

## 2023-09-13 ENCOUNTER — Other Ambulatory Visit: Payer: Self-pay

## 2023-09-13 ENCOUNTER — Encounter: Payer: Self-pay | Admitting: Neurology

## 2023-09-13 ENCOUNTER — Ambulatory Visit: Payer: 59 | Admitting: Neurology

## 2023-09-13 VITALS — BP 113/70 | HR 71 | Ht 68.0 in | Wt 194.0 lb

## 2023-09-13 DIAGNOSIS — R5383 Other fatigue: Secondary | ICD-10-CM

## 2023-09-13 DIAGNOSIS — R519 Headache, unspecified: Secondary | ICD-10-CM | POA: Diagnosis not present

## 2023-09-13 DIAGNOSIS — G43711 Chronic migraine without aura, intractable, with status migrainosus: Secondary | ICD-10-CM

## 2023-09-13 MED ORDER — FREMANEZUMAB-VFRM 225 MG/1.5ML ~~LOC~~ SOSY: 225.0000 mg | PREFILLED_SYRINGE | Freq: Once | SUBCUTANEOUS | Status: AC

## 2023-09-13 MED ORDER — METHYLPREDNISOLONE 4 MG PO TBPK
ORAL_TABLET | ORAL | 1 refills | Status: DC
  Filled 2023-09-13: qty 21, 6d supply, fill #0
  Filled 2023-09-14: qty 21, 6d supply, fill #1

## 2023-09-13 NOTE — Patient Instructions (Addendum)
 New meds: Ajovy  and Emgality monthly self injectors, Qulipta daily pill, vyepti, botox  for prevention Acute management: new meds include Nurtec, Ubrelvy , Zavzpret Medrol  dospak to bridge  Start Ajovy  monthly Botox  for migraines - will get approval, will call for appt for botox  F/u up dr ines 4 months   Consider MRI brain in the future as needed See endocrinology next week, will order bloodwork today Consider sleep apnea referral   Fremanezumab  Injection What is this medication? FREMANEZUMAB  (fre ma NEZ ue mab) prevents migraines. It works by blocking a substance in the body that causes migraines. It is a monoclonal antibody. This medicine may be used for other purposes; ask your health care provider or pharmacist if you have questions. COMMON BRAND NAME(S): AJOVY  What should I tell my care team before I take this medication? They need to know if you have any of these conditions: An unusual or allergic reaction to fremanezumab , other medications, foods, dyes, or preservatives Pregnant or trying to get pregnant Breast-feeding How should I use this medication? This medication is injected under the skin. You will be taught how to prepare and give it. Take it as directed on the prescription label. Keep taking it unless your care team tells you to stop. It is important that you put your used needles and syringes in a special sharps container. Do not put them in a trash can. If you do not have a sharps container, call your pharmacist or care team to get one. Talk to your care team about the use of this medication in children. Special care may be needed. Overdosage: If you think you have taken too much of this medicine contact a poison control center or emergency room at once. NOTE: This medicine is only for you. Do not share this medicine with others. What if I miss a dose? If you miss a dose, take it as soon as you can. If it is almost time for your next dose, take only that dose. Do not  take double or extra doses. What may interact with this medication? Interactions are not expected. This list may not describe all possible interactions. Give your health care provider a list of all the medicines, herbs, non-prescription drugs, or dietary supplements you use. Also tell them if you smoke, drink alcohol, or use illegal drugs. Some items may interact with your medicine. What should I watch for while using this medication? Tell your care team if your symptoms do not start to get better or if they get worse. What side effects may I notice from receiving this medication? Side effects that you should report to your care team as soon as possible: Allergic reactions or angioedema--skin rash, itching or hives, swelling of the face, eyes, lips, tongue, arms, or legs, trouble swallowing or breathing Side effects that usually do not require medical attention (report to your care team if they continue or are bothersome): Pain, redness, or irritation at injection site This list may not describe all possible side effects. Call your doctor for medical advice about side effects. You may report side effects to FDA at 1-800-FDA-1088. Where should I keep my medication? Keep out of the reach of children and pets. Store in a refrigerator or at room temperature between 20 and 25 degrees C (68 and 77 degrees F). Refrigeration (preferred): Store in the refrigerator. Do not freeze. Keep in the original container until you are ready to take it. Remove the dose from the carton about 30 minutes before it is time for you  to use it. If the dose is not used, it may be stored in the original container at room temperature for 7 days. Get rid of any unused medication after the expiration date. Room Temperature: This medication may be stored at room temperature for up to 7 days. Keep it in the original container. Protect from light until time of use. If it is stored at room temperature, get rid of any unused medication  after 7 days or after it expires, whichever is first. To get rid of medications that are no longer needed or have expired: Take the medication to a medication take-back program. Check with your pharmacy or law enforcement to find a location. If you cannot return the medication, ask your pharmacist or care team how to get rid of this medication safely. NOTE: This sheet is a summary. It may not cover all possible information. If you have questions about this medicine, talk to your doctor, pharmacist, or health care provider.  2024 Elsevier/Gold Standard (2021-10-15 00:00:00)  Methylprednisolone  Tablets What is this medication? METHYLPREDNISOLONE  (meth ill pred NISS oh lone) treats many conditions such as asthma, allergic reactions, arthritis, inflammatory bowel diseases, adrenal, and blood or bone marrow disorders. It works by decreasing inflammation, slowing down an overactive immune system, or replacing cortisol normally made in the body. Cortisol is a hormone that plays an important role in how the body responds to stress, illness, and injury. It belongs to a group of medications called steroids. This medicine may be used for other purposes; ask your health care provider or pharmacist if you have questions. COMMON BRAND NAME(S): Medrol , Medrol  Dosepak What should I tell my care team before I take this medication? They need to know if you have any of these conditions: Cushing's syndrome Eye disease, vision problems Diabetes Glaucoma Heart disease High blood pressure Infection especially a viral infection, such as chickenpox, cold sores, or herpes Liver disease Mental health conditions Myasthenia gravis Osteoporosis Recent or upcoming vaccine Seizures Stomach or intestine problems Thyroid  disease An unusual or allergic reaction to lactose, methylprednisolone , other medications, foods, dyes, or preservatives Pregnant or trying to get pregnant Breastfeeding How should I use this  medication? Take this medication by mouth with a glass of water. Follow the directions on the prescription label. Take this medication with food. If you are taking this medication once a day, take it in the morning. Do not take it more often than directed. Do not suddenly stop taking your medication because you may develop a severe reaction. Your care team will tell you how much medication to take. If your care team wants you to stop the medication, the dose may be slowly lowered over time to avoid any side effects. Talk to your care team about the use of this medication in children. Special care may be needed. Overdosage: If you think you have taken too much of this medicine contact a poison control center or emergency room at once. NOTE: This medicine is only for you. Do not share this medicine with others. What if I miss a dose? If you miss a dose, take it as soon as you can. If it is almost time for your next dose, talk to your care team. You may need to miss a dose or take an extra dose. Do not take double or extra doses without advice. What may interact with this medication? Do not take this medication with any of the following: Alefacept Echinacea Live virus vaccines Metyrapone Mifepristone This medication may also interact with  the following: Amphotericin B Aspirin and aspirin-like medications Certain antibiotics, such as erythromycin, clarithromycin, troleandomycin Certain medications for diabetes Certain medications for fungal infections, such as ketoconazole Certain medications for seizures, such as carbamazepine, phenobarbital, phenytoin Certain medications that treat or prevent blood clots, such as warfarin Cholestyramine Cyclosporine Digoxin Diuretics Estrogen or progestin hormones Isoniazid NSAIDs, medications for pain and inflammation, such as ibuprofen  or naproxen Other medications for myasthenia gravis Rifampin Vaccines This list may not describe all possible  interactions. Give your health care provider a list of all the medicines, herbs, non-prescription drugs, or dietary supplements you use. Also tell them if you smoke, drink alcohol, or use illegal drugs. Some items may interact with your medicine. What should I watch for while using this medication? Tell your care team if your symptoms do not start to get better or if they get worse. Do not stop taking except on your care team's advice. You may develop a severe reaction. Your care team will tell you how much medication to take. This medication may increase your risk of getting an infection. Tell your care team if you are around anyone with measles or chickenpox, or if you develop sores or blisters that do not heal properly. This medication may increase blood sugar levels. Ask your care team if changes in diet or medications are needed if you have diabetes. Tell your care team right away if you have any change in your eyesight. Using this medication for a long time may increase your risk of low bone mass. Talk to your care team about bone health. What side effects may I notice from receiving this medication? Side effects that you should report to your care team as soon as possible: Allergic reactions--skin rash, itching, hives, swelling of the face, lips, tongue, or throat Cushing syndrome--increased fat around the midsection, upper back, neck, or face, pink or purple stretch marks on the skin, thinning, fragile skin that easily bruises, unexpected hair growth High blood sugar (hyperglycemia)--increased thirst or amount of urine, unusual weakness or fatigue, blurry vision Increase in blood pressure Infection--fever, chills, cough, sore throat, wounds that don't heal, pain or trouble when passing urine, general feeling of discomfort or being unwell Low adrenal gland function--nausea, vomiting, loss of appetite, unusual weakness or fatigue, dizziness Mood and behavior changes--anxiety, nervousness,  confusion, hallucinations, irritability, hostility, thoughts of suicide or self-harm, worsening mood, feelings of depression Stomach bleeding--bloody or black, tar-like stools, vomiting blood or brown material that looks like coffee grounds Swelling of the ankles, hands, or feet Side effects that usually do not require medical attention (report to your care team if they continue or are bothersome): Acne General discomfort and fatigue Headache Increase in appetite Nausea Trouble sleeping Weight gain This list may not describe all possible side effects. Call your doctor for medical advice about side effects. You may report side effects to FDA at 1-800-FDA-1088. Where should I keep my medication? Keep out of the reach of children and pets. Store at room temperature between 20 and 25 degrees C (68 and 77 degrees F). Throw away any unused medication after the expiration date. NOTE: This sheet is a summary. It may not cover all possible information. If you have questions about this medicine, talk to your doctor, pharmacist, or health care provider.  2024 Elsevier/Gold Standard (2022-04-20 00:00:00)

## 2023-09-13 NOTE — Progress Notes (Signed)
 [facility]    Provider:  Dr Ines Requesting Provider: Valerio Melanie DASEN, NP Primary Care Provider:  Valerio Melanie DASEN, NP  CC:  chronic migraine  HPI:  Jennifer Morse is a 33 y.o. female here as requested by Valerio Melanie DASEN, NP for migraines. She works at the [employer]. She has had migraines since a child. Migraines feel like death. She has light.sound/smell sensitivity, pulsating/pounding/throbbing, can start in the back or front and radiate to the neck, can be one side or the other unilateral but either side, triggres include many such as weather, hormones, stress, light, sound, smell, her young children have migraines and patient has a significant family history of migraines. Patient reports nausea, vomiting. A cold pack, a dark quiet room helps. Daily headaches. No aura. She takes imitrex  and excedrin migraine daily. At least 12 moderate to severe migraine days a month and is it significantly affecting quality of life, debilitating.  MRi recommended but she declines due to personal stressors her husband is in hospital and she denies vision changes, positinal or exertional or any red flags will hold off. She is not having anymore kids, she has an IUD, discussed teratigenicity, her husband has had ligation. No aura. No aura. No medication overuse.   Reviewed notes, labs and imaging from outside physicians, which showed:  Medications tried that can be used in migraine management greater than 3 months includes: Tylenol , she has tried propranolol  she had bradycardia to 35, Excedrin, Wellbutrin , Benadryl , Lexapro , Fioricet, gabapentin , ibuprofen , Toradol  injections, meloxicam , Robaxin , Depo-Medrol  injections, ondansetron , prednisone  tablets, Imitrex , maxalt, tizanidine , topamax, nortriptyline, zomig, relpax, aimvig contraindicated due to constipation, nurtec, ajovy ,     Latest Ref Rng & Units 09/13/2023    2:16 PM 10/19/2022    3:40 PM 11/21/2021   10:04 AM   CBC  WBC 3.4 - 10.8 x10E3/uL 7.6  3.9  4.4   Hemoglobin 11.1 - 15.9 g/dL 87.0  87.3  85.7   Hematocrit 34.0 - 46.6 % 38.9  37.2  41.7   Platelets 150 - 450 x10E3/uL 156  121  150       Latest Ref Rng & Units 09/13/2023    2:16 PM 10/19/2022    3:40 PM 01/17/2022   11:26 AM  CMP  Glucose 70 - 99 mg/dL 89  85    BUN 6 - 20 mg/dL 15  12    Creatinine 9.42 - 1.00 mg/dL 9.17  8.95    Sodium 865 - 144 mmol/L 141  140    Potassium 3.5 - 5.2 mmol/L 3.8  4.1    Chloride 96 - 106 mmol/L 102  104    CO2 20 - 29 mmol/L 24  22    Calcium 8.7 - 10.2 mg/dL 9.6  9.2  8.7   Total Protein 6.0 - 8.5 g/dL 6.9  6.5    Total Bilirubin 0.0 - 1.2 mg/dL 0.2  0.4    Alkaline Phos 44 - 121 IU/L 86  55    AST 0 - 40 IU/L 19  14    ALT 0 - 32 IU/L 25  15     09/19/2022 tsh nml 10/19/2022: vit D 30.7  Review of Systems: Patient complains of symptoms per HPI as well as the following symptoms none. Pertinent negatives and positives per HPI. All others negative.   Social History   Socioeconomic History   Marital status: Married    Spouse name: Lang   Number of children: 2   Years  of education: Not on file   Highest education level: Bachelor's degree (e.g., BA, AB, BS)  Occupational History   Occupation: RN  Tobacco Use   Smoking status: Former    Current packs/day: 0.00    Types: Cigarettes    Quit date: 02/26/2015    Years since quitting: 8.5   Smokeless tobacco: Never  Vaping Use   Vaping status: Never Used  Substance and Sexual Activity   Alcohol use: Not Currently    Alcohol/week: 1.0 standard drink of alcohol    Types: 1 Glasses of wine per week   Drug use: No   Sexual activity: Yes    Partners: Male    Birth control/protection: I.U.D.  Other Topics Concern   Not on file  Social History Narrative   Caffeine : 1-3 cups/day plus what is in Excedrin migraine when taken   Social Drivers of Health   Financial Resource Strain: Medium Risk (05/24/2023)   Overall Financial Resource Strain  (CARDIA)    Difficulty of Paying Living Expenses: Somewhat hard  Food Insecurity: No Food Insecurity (05/24/2023)   Hunger Vital Sign    Worried About Running Out of Food in the Last Year: Never true    Ran Out of Food in the Last Year: Never true  Transportation Needs: No Transportation Needs (05/24/2023)   PRAPARE - Administrator, Civil Service (Medical): No    Lack of Transportation (Non-Medical): No  Physical Activity: Sufficiently Active (05/24/2023)   Exercise Vital Sign    Days of Exercise per Week: 3 days    Minutes of Exercise per Session: 60 min  Stress: Stress Concern Present (05/24/2023)   [employer] of Occupational Health - Occupational Stress Questionnaire    Feeling of Stress : Rather much  Social Connections: Socially Integrated (05/24/2023)   Social Connection and Isolation Panel [NHANES]    Frequency of Communication with Friends and Family: More than three times a week    Frequency of Social Gatherings with Friends and Family: Once a week    Attends Religious Services: More than 4 times per year    Active Member of [employer] or Organizations: Yes    Attends Engineer, Structural: More than 4 times per year    Marital Status: Married  Catering Manager Violence: Not At Risk (08/13/2021)   Humiliation, Afraid, Rape, and Kick questionnaire    Fear of Current or Ex-Partner: No    Emotionally Abused: No    Physically Abused: No    Sexually Abused: No    Family History  Problem Relation Age of Onset   Migraines Mother    Diabetes Mother    Ovarian cancer Mother    Diabetes Sister    Diabetes Maternal Grandmother    Heart disease Maternal Grandmother    Hypertension Maternal Grandmother    Kidney cancer Maternal Grandmother    Migraines Maternal Grandfather    Diabetes Maternal Grandfather    Heart disease Maternal Grandfather    Hypertension Maternal Grandfather    Diabetes Maternal Aunt    Migraines Child    Migraines Child    Breast  cancer Neg Hx    Colon cancer Neg Hx     Past Medical History:  Diagnosis Date   Anxiety    BRCA negative 07/2021   MyRisk neg; IBIS=11.0%.riskscore=15.8%   Degenerative lumbar disc    Frequent headaches    Gestational thrombocytopenia (HCC)    Lumbar herniated disc    Migraine with aura, intractable, with status  migrainosus 1995   Oligomenorrhea    Postpartum depression    with first pregnancy    Patient Active Problem List   Diagnosis Date Noted   Nonspecific syndrome suggestive of viral illness 05/15/2023   Chronic constipation 10/19/2022   Vitamin D  deficiency 10/16/2022   Anal sphincter spasm 05/24/2022   Grade II internal hemorrhoids 01/21/2022   Thyroid  nodule 01/17/2022   Family history of ovarian cancer 08/02/2021   History of kidney stones 08/19/2020   Lumbar radiculopathy 05/13/2020   IUD (intrauterine device) in place 02/27/2020   Overweight (BMI 25.0-29.9) 08/06/2018   Hypothyroidism 09/08/2017   External thrombosed hemorrhoids 05/04/2017   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     budesonide -formoterol  (SYMBICORT ) 160-4.5 MCG/ACT inhaler Inhale 2 puffs into the lungs as needed (Do not take over 12 puffs/day). 10.2 g 3   buPROPion  (WELLBUTRIN  XL) 300 MG 24 hr tablet Take 1 tablet (300 mg total) by mouth daily. 90 tablet 4   busPIRone  (BUSPAR ) 7.5 MG tablet Take 1 tablet (7.5 mg total) by mouth 2 (two) times daily. 180 tablet 4   Fremanezumab -vfrm (AJOVY ) 225 MG/1.5ML SOAJ Inject 225 mg into the skin every 30 (thirty) days. Please run copay card: BIN# [id number] PCN# [id number] GRP# [id number] ID# [id number] 1.5 mL 11   gabapentin   (NEURONTIN ) 600 MG tablet Take 0.5 tablets (300 mg total) by mouth at bedtime. 45 tablet 4   hydrocortisone  (ANUSOL -HC) 2.5 % rectal cream Place 1 application. rectally 2 (two) times daily. 28 g 0   levonorgestrel  (MIRENA ) 20 MCG/24HR IUD 1 each by Intrauterine route once.     lubiprostone  (AMITIZA ) 24 MCG capsule Take 1 capsule (24 mcg total) by mouth 2 (two) times daily with a meal. 180 capsule 4   methylPREDNISolone  (MEDROL  DOSEPAK) 4 MG TBPK tablet Take pills daily all together with food. Take the first dose (6 pills) as soon as possible. Take the rest each morning for 6 days total (6-5-4-3-2-1) 21 tablet 1   SUMAtriptan  (IMITREX ) 100 MG tablet Take 1 tablet (100 mg total) by mouth every 2 (two) hours as needed for migraine. May repeat in 2 hours if headache persists or recurs. 9 tablet 12   Current Facility-Administered Medications  Medication Dose Route Frequency Provider Last Rate Last Admin   Fremanezumab -vfrm SOSY 225 mg  225 mg Subcutaneous Once         Allergies as of 09/13/2023 - Review Complete 09/13/2023  Allergen Reaction Noted   Propranolol   09/13/2023    Vitals: BP 113/70 (BP Location: Right Arm, Patient Position: Sitting, Cuff Size: Normal)   Pulse 71   Ht 5' 8 (1.727 m)   Wt 194 lb (88 kg)   BMI 29.50 kg/m  Last Weight:  Wt Readings from Last 1 Encounters:  09/13/23 194 lb (88 kg)   Last Height:   Ht Readings from Last 1 Encounters:  09/13/23 5' 8 (1.727 m)     Physical exam: Exam: Gen: NAD, conversant, well nourised, well groomed                     CV: RRR, no MRG. No Carotid Bruits. No peripheral edema, warm, nontender  Eyes: Conjunctivae clear without exudates or hemorrhage  Neuro: Detailed Neurologic Exam  Speech:    Speech is normal; fluent and spontaneous with normal comprehension.  Cognition:    The patient is oriented to person, place, and time;     recent and remote memory intact;     language fluent;     normal attention, concentration,      fund of knowledge Cranial Nerves:    The pupils are equal, round, and reactive to light. The fundi are normal and spontaneous venous pulsations are present. Visual fields are full to finger confrontation. Extraocular movements are intact. Trigeminal sensation is intact and the muscles of mastication are normal. The face is symmetric. The palate elevates in the midline. Hearing intact. Voice is normal. Shoulder shrug is normal. The tongue has normal motion without fasciculations.   Coordination:    Normal finger to nose and heel to shin. Normal rapid alternating movements.   Gait:    Heel-toe and tandem gait are normal.   Motor Observation:    No asymmetry, no atrophy, and no involuntary movements noted. Tone:    Normal muscle tone.    Posture:    Posture is normal. normal erect    Strength:    Strength is V/V in the upper and lower limbs.      Sensation: intact to LT     Reflex Exam:  DTR's:    Deep tendon reflexes in the upper and lower extremities are normal bilaterally.   Toes:    The toes are downgoing bilaterally.   Clonus:    Clonus is absent.    Assessment/Plan:  Patient with chronic migraines  Discussed CGRP meds: Ajovy  and Emgality monthly self injectors, Qulipta daily pill, vyepti, botox  for prevention Discussed CGRP Acute management: new meds include Nurtec, Ubrelvy , Zavzpret  Prevention: Ajovy  monthly Botox  for migraines - will get approval, will call for appt for botox  F/u up dr ines 4 months  Medrol  dospak to bridge  Consider MRI brain in the future as needed See endocrinology next week, will order bloodwork today Consider sleep apnea referral   Orders Placed This Encounter  Procedures   CBC with Differential/Platelets   Comprehensive metabolic panel   TSH Rfx on Abnormal to Free T4   Meds ordered this encounter  Medications   methylPREDNISolone  (MEDROL  DOSEPAK) 4 MG TBPK tablet    Sig: Take pills daily all together with food. Take the  first dose (6 pills) as soon as possible. Take the rest each morning for 6 days total (6-5-4-3-2-1)    Dispense:  21 tablet    Refill:  1   Fremanezumab -vfrm SOSY 225 mg   Fremanezumab -vfrm (AJOVY ) 225 MG/1.5ML SOAJ    Sig: Inject 225 mg into the skin every 30 (thirty) days. Please run copay card: BIN# [id number] PCN# [id number] GRP# [id number] ID# [id number]    Dispense:  1.5 mL    Refill:  11    Please run copay card: BIN# [id number] PCN# [id number] GRP# [id number] ID# [id number]   Discussed teratogenicity do not get pregnant for 6 months after stopping Ajovy   Cc: Cannady, Jolene T, NP,  Valerio Melanie DASEN, NP  Onetha Ines, MD  [facility] [address] [place], [place] [address]  Phone [phone] Fax [phone]  I spent over 45 minutes of face-to-face and non-face-to-face time with patient on the  1. Chronic migraine without aura, with intractable migraine, so stated, with status migrainosus   2. Chronic daily headache   3.  Other fatigue    diagnosis.  This included previsit chart review, lab review, study review, order entry, electronic health record documentation, patient education on the different diagnostic and therapeutic options, counseling and coordination of care, risks and benefits of management, compliance, or risk factor reduction

## 2023-09-14 ENCOUNTER — Encounter: Payer: Self-pay | Admitting: Neurology

## 2023-09-14 ENCOUNTER — Other Ambulatory Visit: Payer: Self-pay

## 2023-09-14 ENCOUNTER — Telehealth: Payer: Self-pay | Admitting: Neurology

## 2023-09-14 DIAGNOSIS — G43711 Chronic migraine without aura, intractable, with status migrainosus: Secondary | ICD-10-CM

## 2023-09-14 LAB — COMPREHENSIVE METABOLIC PANEL
ALT: 25 [IU]/L (ref 0–32)
AST: 19 [IU]/L (ref 0–40)
Albumin: 4.4 g/dL (ref 3.9–4.9)
Alkaline Phosphatase: 86 [IU]/L (ref 44–121)
BUN/Creatinine Ratio: 18 (ref 9–23)
BUN: 15 mg/dL (ref 6–20)
Bilirubin Total: 0.2 mg/dL (ref 0.0–1.2)
CO2: 24 mmol/L (ref 20–29)
Calcium: 9.6 mg/dL (ref 8.7–10.2)
Chloride: 102 mmol/L (ref 96–106)
Creatinine, Ser: 0.82 mg/dL (ref 0.57–1.00)
Globulin, Total: 2.5 g/dL (ref 1.5–4.5)
Glucose: 89 mg/dL (ref 70–99)
Potassium: 3.8 mmol/L (ref 3.5–5.2)
Sodium: 141 mmol/L (ref 134–144)
Total Protein: 6.9 g/dL (ref 6.0–8.5)
eGFR: 96 mL/min/{1.73_m2} (ref >59)

## 2023-09-14 LAB — CBC WITH DIFFERENTIAL/PLATELET
Basophils Absolute: 0.1 10*3/uL (ref 0.0–0.2)
Basos: 1 %
EOS (ABSOLUTE): 0.1 10*3/uL (ref 0.0–0.4)
Eos: 1 %
Hematocrit: 38.9 % (ref 34.0–46.6)
Hemoglobin: 12.9 g/dL (ref 11.1–15.9)
Immature Grans (Abs): 0 10*3/uL (ref 0.0–0.1)
Immature Granulocytes: 0 %
Lymphocytes Absolute: 2.2 10*3/uL (ref 0.7–3.1)
Lymphs: 29 %
MCH: 30.1 pg (ref 26.6–33.0)
MCHC: 33.2 g/dL (ref 31.5–35.7)
MCV: 91 fL (ref 79–97)
Monocytes Absolute: 0.5 10*3/uL (ref 0.1–0.9)
Monocytes: 7 %
Neutrophils Absolute: 4.7 10*3/uL (ref 1.4–7.0)
Neutrophils: 62 %
Platelets: 156 10*3/uL (ref 150–450)
RBC: 4.28 x10E6/uL (ref 3.77–5.28)
RDW: 12.2 % (ref 11.7–15.4)
WBC: 7.6 10*3/uL (ref 3.4–10.8)

## 2023-09-14 LAB — TSH RFX ON ABNORMAL TO FREE T4: TSH: 2 u[IU]/mL (ref 0.450–4.500)

## 2023-09-14 MED ORDER — AJOVY 225 MG/1.5ML ~~LOC~~ SOAJ
225.0000 mg | SUBCUTANEOUS | 11 refills | Status: DC
  Filled 2023-09-14 – 2023-10-09 (×3): qty 1.5, 30d supply, fill #0
  Filled 2023-11-03: qty 1.5, 30d supply, fill #1
  Filled 2023-12-04: qty 1.5, 30d supply, fill #2
  Filled 2023-12-27 – 2023-12-28 (×2): qty 1.5, 30d supply, fill #3
  Filled 2024-02-06: qty 1.5, 30d supply, fill #4

## 2023-09-14 NOTE — Telephone Encounter (Signed)
 Start BOTOX:    can see NP  Chronic Migraine CPT 64615    Botox J0585 Units:200  G43.711 Chronic Migraine without aura, intractable, with status migrainous

## 2023-09-14 NOTE — Telephone Encounter (Signed)
-----   Message from Anson Fret sent at 09/14/2023 11:22 AM EST ----- Regarding: Please start botox approval, can go to NP Please start botox approval, can go to NP g43.711

## 2023-09-14 NOTE — Telephone Encounter (Signed)
 Submitted auth via CMM, status is pending. Key: Teryl Lucy

## 2023-09-14 NOTE — Telephone Encounter (Signed)
 Sent msg to pt asking for insurance card, nothing scanned in since 2015.

## 2023-09-15 ENCOUNTER — Other Ambulatory Visit: Payer: Self-pay

## 2023-09-19 ENCOUNTER — Other Ambulatory Visit (HOSPITAL_COMMUNITY): Payer: Self-pay

## 2023-09-19 ENCOUNTER — Other Ambulatory Visit: Payer: Self-pay

## 2023-09-19 ENCOUNTER — Other Ambulatory Visit (HOSPITAL_COMMUNITY): Payer: Self-pay | Admitting: Pharmacy Technician

## 2023-09-19 MED ORDER — ONABOTULINUMTOXINA 200 UNITS IJ SOLR
INTRAMUSCULAR | 3 refills | Status: AC
  Filled 2023-09-19: qty 1, fill #0
  Filled 2023-09-22: qty 1, 90d supply, fill #0
  Filled 2024-01-19: qty 1, 90d supply, fill #1
  Filled 2024-04-16: qty 1, 90d supply, fill #2
  Filled 2024-07-09: qty 1, 90d supply, fill #3

## 2023-09-19 NOTE — Telephone Encounter (Signed)
 Received approval, please send rx to [facility]. Sent message to pt about scheduling.  Auth#: [id number] (09/18/23-03/16/24)

## 2023-09-19 NOTE — Addendum Note (Signed)
 Addended by: Bertram Savin on: 09/19/2023 08:14 AM   Modules accepted: Orders

## 2023-09-19 NOTE — Telephone Encounter (Signed)
Botox 200 unit Rx sent to [facility].

## 2023-09-20 ENCOUNTER — Ambulatory Visit (INDEPENDENT_AMBULATORY_CARE_PROVIDER_SITE_OTHER): Payer: 59 | Admitting: Internal Medicine

## 2023-09-20 ENCOUNTER — Encounter: Payer: Self-pay | Admitting: Internal Medicine

## 2023-09-20 VITALS — BP 118/70 | HR 69 | Ht 68.0 in | Wt 195.0 lb

## 2023-09-20 DIAGNOSIS — E041 Nontoxic single thyroid nodule: Secondary | ICD-10-CM

## 2023-09-20 NOTE — Progress Notes (Signed)
 Name: Jennifer Morse  MRN/ DOB: [id number], 1991-02-28    Age/ Sex: 33 y.o., female    PCP: Lemar Pyles, NP   Reason for Endocrinology Evaluation: Thyroid  nodule     Date of Initial Endocrinology Evaluation: 01/17/2022    HPI: Jennifer Morse is a 33 y.o. female with a past medical history of migraine headaches, hypothyroid. The patient presented for initial endocrinology clinic visit on 01/17/2022 for consultative assistance with her thyroid  nodule.   She has a Hx of hypothyroidism in 2018 . She was on LT-4 replacement for a couple years until she self discontinued    She has been diagnosed with an isthmic nodule at 1 cm in diameter in 2018.  Repeat ultrasound in December 2022 showed increase in size to 1.7 cm meeting FNA criteria.  She is s/p FNA of the isthmic nodule on 10/04/2021 with benign cytology    No FH of thyroid  disease   She has 2 kids, 5 and 1 yr      SUBJECTIVE:    Today (09/20/23:  Jennifer Morse is here for a follow up on thyroid  nodule.    Denies local neck swelling  Has chronic constipation - on Amitiza   Denies palpitations  Has occasional tremors that she attributes to stress  NO biotin      HISTORY:  Past Medical History:  Past Medical History:  Diagnosis Date   Anxiety    BRCA negative 07/2021   MyRisk neg; IBIS=11.0%.riskscore=15.8%   Degenerative lumbar disc    Frequent headaches    Gestational thrombocytopenia (HCC)    Lumbar herniated disc    Migraine with aura, intractable, with status migrainosus 1995   Oligomenorrhea    Postpartum depression    with first pregnancy   Past Surgical History:  Past Surgical History:  Procedure Laterality Date   WISDOM TOOTH EXTRACTION  2012    Social History:  reports that she quit smoking about 8 years ago. Her smoking use included cigarettes. She has never used smokeless tobacco. She reports that she does not currently use alcohol after a past usage of about 1.0 standard drink  of alcohol per week. She reports that she does not use drugs. Family History: family history includes Diabetes in her maternal aunt, maternal grandfather, maternal grandmother, mother, and sister; Heart disease in her maternal grandfather and maternal grandmother; Hypertension in her maternal grandfather and maternal grandmother; Kidney cancer in her maternal grandmother; Migraines in her child, child, maternal grandfather, and mother; Ovarian cancer in her mother.   HOME MEDICATIONS: Allergies as of 09/20/2023       Reactions   Propranolol     Caused symptomatic bradycardia         Medication List        Accurate as of September 20, 2023  2:58 PM. If you have any questions, ask your nurse or doctor.          Ajovy  225 MG/1.5ML Soaj Generic drug: Fremanezumab -vfrm Inject 225 mg into the skin every 30 (thirty) days. Please run copay card: BIN# [id number] PCN# [id number] GRP# [id number] ID# [id number]   aspirin-acetaminophen -caffeine  250-250-65 MG tablet Commonly known as: EXCEDRIN MIGRAINE Take by mouth every 6 (six) hours as needed for headache.   botulinum toxin Type A  200 units injection Commonly known as: BOTOX  Provider to inject 155 units into the muscles of the head and neck every 12 weeks. Discard remainder.   budesonide -formoterol  160-4.5 MCG/ACT inhaler Commonly known as: SYMBICORT  Inhale 2 puffs into  the lungs as needed (Do not take over 12 puffs/day).   buPROPion  300 MG 24 hr tablet Commonly known as: WELLBUTRIN  XL Take 1 tablet (300 mg total) by mouth daily.   busPIRone  7.5 MG tablet Commonly known as: BUSPAR  Take 1 tablet (7.5 mg total) by mouth 2 (two) times daily.   gabapentin  600 MG tablet Commonly known as: NEURONTIN  Take 1/2  tablet (300 mg) by mouth at bedtime. (Take 0.5 tablets (300 mg total) by mouth at bedtime.)   levonorgestrel  20 MCG/24HR IUD Commonly known as: MIRENA  1 each by Intrauterine route once.   lubiprostone  24 MCG capsule Commonly known as:  AMITIZA  Take 1 capsule (24 mcg total) by mouth 2 (two) times daily with a meal.   methylPREDNISolone  4 MG Tbpk tablet Commonly known as: MEDROL  DOSEPAK Take pills daily all together with food. Take the first dose (6 pills) as soon as possible. Take the rest each morning for 6 days total (6-5-4-3-2-1)   Procto-Med HC  2.5 % rectal cream Generic drug: hydrocortisone  Place 1 application rectally 2 (two) times daily. (Place 1 application. rectally 2 (two) times daily.)   SUMAtriptan  100 MG tablet Commonly known as: Imitrex  Take 1 tablet (100 mg total) by mouth every 2 (two) hours as needed for migraine. May repeat in 2 hours if headache persists or recurs.          REVIEW OF SYSTEMS: A comprehensive ROS was conducted with the patient and is negative except as per HPI    OBJECTIVE:  VS: BP 118/70 (BP Location: Left Arm, Patient Position: Sitting, Cuff Size: Normal)   Pulse 69   Ht 5\' 8"  (1.727 m)   Wt 195 lb (88.5 kg)   SpO2 98%   BMI 29.65 kg/m    Wt Readings from Last 3 Encounters:  09/20/23 195 lb (88.5 kg)  09/13/23 194 lb (88 kg)  05/24/23 171 lb 12.8 oz (77.9 kg)     EXAM: General: Pt appears well and is in NAD  Neck: General: Supple without adenopathy. Thyroid : Thyroid  nodule appreciated.   Lungs: Clear with good BS bilat   Heart: Auscultation: RRR.  Abdomen: Soft, nontender  Extremities:  BL LE: No pretibial edema   Mental Status: Judgment, insight: Intact Orientation: Oriented to time, place, and person Mood and affect: No depression, anxiety, or agitation     DATA REVIEWED:  Latest Reference Range & Units 09/13/23 14:16  TSH 0.450 - 4.500 uIU/mL 2.000    Thyroid  ultrasound 09/23/2022   Estimated total number of nodules >/= 1 cm: 0   Number of spongiform nodules >/=  2 cm not described below (TR1): 0   Number of mixed cystic and solid nodules >/= 1.5 cm not described below (TR2): 0   _________________________________________________________    The previously biopsied isthmic nodule has significantly involuted and now measures only 0.6 x 0.4 x 0.5 cm compared to 1.7 x 1.4 x 1.1 cm previously.   No new nodules or suspicious features.   IMPRESSION: Significant involution of previously biopsied isthmic thyroid  nodule. No further follow-up recommended.   No new nodules or suspicious features.     FNA isthmic nodule 10/04/2021  Specimen Submitted:  A. Thyroid , midline isthmus nodule   Clinical History: None provided     DIAGNOSIS:  A. THYROID  GLAND, MIDLINE ISTHMUS; ULTRASOUND-GUIDED FINE-NEEDLE  ASPIRATION:  - BENIGN (BETHESDA CATEGORY II).      ASSESSMENT/PLAN/RECOMMENDATIONS:   Thyroid  nodule:   -Patient is biochemically euthyroid -No local neck symptoms -She is s/p  FNA of isthmic nodule with benign cytology,  -Repeat ultrasound 2024, showed significant involution of the nodule, I did recommend rechecking again this year and confirming previous results, if results remain the same no further follow-up will be recommended  Follow-up pending thyroid  results  Signed electronically by: Natale Bail, MD  [facility]  [facility] [address] [place] [place], [place] [address] Phone: [phone] FAX: [phone]   CC: Lemar Pyles, NP [address] [place] [address] Phone: [phone] Fax: [phone]   Return to Endocrinology clinic as below: Future Appointments  Date Time Provider Department Center  09/20/2023  3:00 PM Samanthajo Payano, Julian Obey, MD [facility] None  11/02/2023  9:45 AM Johny Nap, NP [facility] None  02/01/2024  3:00 PM Glory Larsen, MD [facility] None

## 2023-09-22 ENCOUNTER — Other Ambulatory Visit: Payer: Self-pay

## 2023-09-22 ENCOUNTER — Other Ambulatory Visit (HOSPITAL_COMMUNITY): Payer: Self-pay

## 2023-09-22 NOTE — Progress Notes (Signed)
Specialty Pharmacy Initial Fill Coordination Note  Jennifer Morse is a 33 y.o. female contacted today regarding initial fill of specialty medication(s) OnabotulinumtoxinA (BOTOX)   Patient requested Courier to Provider Office   Delivery date: 10/26/23   Verified address: [facility], [address], [place], [place], [address]   Medication will be filled on 10/25/2023.   Patient is aware of $0 copayment.

## 2023-09-26 ENCOUNTER — Ambulatory Visit: Payer: 59

## 2023-10-10 ENCOUNTER — Other Ambulatory Visit: Payer: Self-pay

## 2023-10-17 ENCOUNTER — Ambulatory Visit: Payer: 59 | Admitting: Nurse Practitioner

## 2023-10-17 ENCOUNTER — Other Ambulatory Visit: Payer: Self-pay

## 2023-10-17 ENCOUNTER — Encounter: Payer: Self-pay | Admitting: Nurse Practitioner

## 2023-10-17 VITALS — BP 98/63 | HR 76 | Temp 98.1°F | Ht 68.0 in | Wt 204.0 lb

## 2023-10-17 DIAGNOSIS — G8929 Other chronic pain: Secondary | ICD-10-CM | POA: Diagnosis not present

## 2023-10-17 DIAGNOSIS — M545 Low back pain, unspecified: Secondary | ICD-10-CM | POA: Diagnosis not present

## 2023-10-17 MED ORDER — CYCLOBENZAPRINE HCL 10 MG PO TABS
10.0000 mg | ORAL_TABLET | Freq: Three times a day (TID) | ORAL | 0 refills | Status: DC | PRN
  Filled 2023-10-17: qty 45, 15d supply, fill #0

## 2023-10-17 MED ORDER — PREDNISONE 10 MG PO TABS
ORAL_TABLET | ORAL | 0 refills | Status: AC
  Filled 2023-10-17: qty 42, 12d supply, fill #0

## 2023-10-17 MED ORDER — TRIAMCINOLONE ACETONIDE 40 MG/ML IJ SUSP
40.0000 mg | Freq: Once | INTRAMUSCULAR | Status: AC
  Administered 2023-10-17: 40 mg via INTRAMUSCULAR

## 2023-10-17 MED ORDER — HYDROCODONE-ACETAMINOPHEN 10-325 MG PO TABS
1.0000 | ORAL_TABLET | Freq: Three times a day (TID) | ORAL | 0 refills | Status: DC | PRN
  Filled 2023-10-17: qty 15, 5d supply, fill #0

## 2023-10-17 NOTE — Patient Instructions (Signed)
posture. Avoid activities or situations that make you feel anxious or stressed. Stress and anxiety increase muscle tension and can make back pain worse. Learn ways to manage anxiety and stress, such as through exercise. General instructions Sleep on a firm mattress in a comfortable position. Try lying on your side with your knees slightly bent. If you lie on your back, put a pillow under your knees. Keep your head and neck in a straight line with your spine (neutral position) when using electronic equipment like  smartphones or pads. To do this: Raise your smartphone or pad to look at it instead of bending your head or neck to look down. Put the smartphone or pad at the level of your face while looking at the screen. Follow your treatment plan as told by your health care provider. This may include: Cognitive or behavioral therapy. Acupuncture or massage therapy. Meditation or yoga. Contact a health care provider if: You have pain that is not relieved with rest or medicine. You have increasing pain going down into your legs or buttocks. Your pain does not improve after 2 weeks. You have pain at night. You lose weight without trying. You have a fever or chills. You develop nausea or vomiting. You develop abdominal pain. Get help right away if: You develop new bowel or bladder control problems. You have unusual weakness or numbness in your arms or legs. You feel faint. These symptoms may represent a serious problem that is an emergency. Do not wait to see if the symptoms will go away. Get medical help right away. Call your local emergency services (911 in the [place] Do not drive yourself to the hospital. Summary Acute back pain is sudden and usually short-lived. Use proper lifting techniques. When you bend and lift, use positions that put less stress on your back. Take over-the-counter and prescription medicines only as told by your health care provider, and apply heat or ice as told. This information is not intended to replace advice given to you by your health care provider. Make sure you discuss any questions you have with your health care provider. Document Revised: 11/13/2020 Document Reviewed: 11/13/2020 Elsevier Patient Education  2024 [employer].

## 2023-10-17 NOTE — Assessment & Plan Note (Addendum)
Has underlying chronic back pain with current acute flare after recent fall.  Positive straight leg both sides.  No red flag symptoms.  Has history of disc herniations on imaging in 2021.  Will start out with Kenalog in office and then Prednisone taper to take at home.  Flexeril and Norco sent in to take as needed.  Recommend rest and may continue Ibuprofen and heat at home.  If in one week there is no improvement she is to notify provider and we will obtain imaging.

## 2023-10-17 NOTE — Progress Notes (Signed)
BP 98/63   Pulse 76   Temp 98.1 F (36.7 C) (Oral)   Ht 5\' 8"  (1.727 m)   Wt 204 lb (92.5 kg)   SpO2 98%   BMI 31.02 kg/m    Subjective:    Patient ID: Jennifer Morse, female    DOB: 1990/12/27, 33 y.o.   MRN: [id number]  HPI: Jennifer Morse is a 33 y.o. female  Chief Complaint  Patient presents with   Back Pain    Patient states she has been having low back pain for a while but got really bad after a fall ice skating this past Saturday. States her pains stays at a consistent 6 but is an 8 right now. Patient describes the pain as sharp.     BACK PAIN Has underlying back issues that are stable.  Recently fell while skating Saturday.  Landed on bottom when fell, does not feel she hit bone.  Normally her back pain stays at a 6, but currently is an 8.   MRI in 2021 of lumbar spine noted disc degeneration with small disc herniations L3-L4 and L5-S1.  Has history of epidural injections in past. Duration: days Mechanism of injury:  trauma, fell Location: bilateral and low back Onset: sudden Severity: 8/10 Quality: sharp, aching, and throbbing Frequency: constant Radiation:  to top of buttocks Aggravating factors: lifting, movement, laying, bending, and prolonged sitting Alleviating factors: nothing Status: fluctuating Treatments attempted: Ibuprofen, Gabapentin, heat and ice  Relief with NSAIDs?: no Nighttime pain:  yes Paresthesias / decreased sensation:  no Bowel / bladder incontinence:  no Fevers:  no Dysuria / urinary frequency:  no   Relevant past medical, surgical, family and social history reviewed and updated as indicated. Interim medical history since our last visit reviewed. Allergies and medications reviewed and updated.  Review of Systems  Constitutional:  Negative for activity change, appetite change, diaphoresis, fatigue and fever.  Respiratory:  Negative for cough, chest tightness and shortness of breath.   Cardiovascular:  Negative for chest  pain, palpitations and leg swelling.  Gastrointestinal: Negative.   Musculoskeletal:  Positive for back pain.  Neurological: Negative.   Psychiatric/Behavioral: Negative.      Per HPI unless specifically indicated above     Objective:    BP 98/63   Pulse 76   Temp 98.1 F (36.7 C) (Oral)   Ht 5\' 8"  (1.727 m)   Wt 204 lb (92.5 kg)   SpO2 98%   BMI 31.02 kg/m   Wt Readings from Last 3 Encounters:  10/17/23 204 lb (92.5 kg)  09/20/23 195 lb (88.5 kg)  09/13/23 194 lb (88 kg)    Physical Exam Vitals and nursing note reviewed.  Constitutional:      General: She is awake. She is not in acute distress.    Appearance: She is well-developed and well-groomed. She is not ill-appearing or toxic-appearing.  HENT:     Head: Normocephalic.     Right Ear: Hearing and external ear normal.     Left Ear: Hearing and external ear normal.  Eyes:     General: Lids are normal.        Right eye: No discharge.        Left eye: No discharge.     Conjunctiva/sclera: Conjunctivae normal.     Pupils: Pupils are equal, round, and reactive to light.  Neck:     Thyroid: No thyromegaly.     Vascular: No carotid bruit.  Cardiovascular:  Rate and Rhythm: Normal rate and regular rhythm.     Heart sounds: Normal heart sounds. No murmur heard.    No gallop.  Pulmonary:     Effort: Pulmonary effort is normal. No accessory muscle usage or respiratory distress.     Breath sounds: Normal breath sounds.  Abdominal:     General: Bowel sounds are normal. There is no distension.     Palpations: Abdomen is soft.     Tenderness: There is no abdominal tenderness.  Musculoskeletal:     Cervical back: Normal range of motion and neck supple.     Lumbar back: Tenderness present. No swelling. Decreased range of motion. Positive right straight leg raise test and positive left straight leg raise test.     Right lower leg: No edema.     Left lower leg: No edema.     Comments: Difficulty moving in chair.     Lymphadenopathy:     Cervical: No cervical adenopathy.  Skin:    General: Skin is warm and dry.  Neurological:     Mental Status: She is alert and oriented to person, place, and time.     Cranial Nerves: Cranial nerves 2-12 are intact.     Gait: Gait abnormal (antalgic).     Deep Tendon Reflexes: Reflexes are normal and symmetric.     Reflex Scores:      Brachioradialis reflexes are 2+ on the right side and 2+ on the left side.      Patellar reflexes are 2+ on the right side and 2+ on the left side. Psychiatric:        Attention and Perception: Attention normal.        Mood and Affect: Mood normal.        Speech: Speech normal.        Behavior: Behavior normal. Behavior is cooperative.        Thought Content: Thought content normal.     Results for orders placed or performed in visit on 09/13/23  CBC with Differential/Platelets   Collection Time: 09/13/23  2:16 PM  Result Value Ref Range   WBC 7.6 3.4 - 10.8 x10E3/uL   RBC 4.28 3.77 - 5.28 x10E6/uL   Hemoglobin 12.9 11.1 - 15.9 g/dL   Hematocrit 19.1 47.8 - 46.6 %   MCV 91 79 - 97 fL   MCH 30.1 26.6 - 33.0 pg   MCHC 33.2 31.5 - 35.7 g/dL   RDW 29.5 62.1 - 30.8 %   Platelets 156 150 - 450 x10E3/uL   Neutrophils 62 Not Estab. %   Lymphs 29 Not Estab. %   Monocytes 7 Not Estab. %   Eos 1 Not Estab. %   Basos 1 Not Estab. %   Neutrophils Absolute 4.7 1.4 - 7.0 x10E3/uL   Lymphocytes Absolute 2.2 0.7 - 3.1 x10E3/uL   Monocytes Absolute 0.5 0.1 - 0.9 x10E3/uL   EOS (ABSOLUTE) 0.1 0.0 - 0.4 x10E3/uL   Basophils Absolute 0.1 0.0 - 0.2 x10E3/uL   Immature Granulocytes 0 Not Estab. %   Immature Grans (Abs) 0.0 0.0 - 0.1 x10E3/uL  Comprehensive metabolic panel   Collection Time: 09/13/23  2:16 PM  Result Value Ref Range   Glucose 89 70 - 99 mg/dL   BUN 15 6 - 20 mg/dL   Creatinine, Ser 6.57 0.57 - 1.00 mg/dL   eGFR 96 >84 ON/GEX/5.28   BUN/Creatinine Ratio 18 9 - 23   Sodium 141 134 - 144 mmol/L   Potassium 3.8  3.5 - 5.2  mmol/L   Chloride 102 96 - 106 mmol/L   CO2 24 20 - 29 mmol/L   Calcium 9.6 8.7 - 10.2 mg/dL   Total Protein 6.9 6.0 - 8.5 g/dL   Albumin 4.4 3.9 - 4.9 g/dL   Globulin, Total 2.5 1.5 - 4.5 g/dL   Bilirubin Total 0.2 0.0 - 1.2 mg/dL   Alkaline Phosphatase 86 44 - 121 IU/L   AST 19 0 - 40 IU/L   ALT 25 0 - 32 IU/L  TSH Rfx on Abnormal to Free T4   Collection Time: 09/13/23  2:16 PM  Result Value Ref Range   TSH 2.000 0.450 - 4.500 uIU/mL      Assessment & Plan:   Problem List Items Addressed This Visit       Other   Acute on chronic low back pain - Primary   Has underlying chronic back pain with current acute flare after recent fall.  Positive straight leg both sides.  No red flag symptoms.  Has history of disc herniations on imaging in 2021.  Will start out with Kenalog in office and then Prednisone taper to take at home.  Flexeril and Norco sent in to take as needed.  Recommend rest and may continue Ibuprofen and heat at home.  If in one week there is no improvement she is to notify provider and we will obtain imaging.      Relevant Medications   triamcinolone acetonide (KENALOG-40) injection 40 mg   predniSONE (DELTASONE) 10 MG tablet   cyclobenzaprine (FLEXERIL) 10 MG tablet   HYDROcodone-acetaminophen (NORCO) 10-325 MG tablet     Follow up plan: Return if symptoms worsen or fail to improve.

## 2023-10-23 ENCOUNTER — Encounter: Payer: Self-pay | Admitting: Nurse Practitioner

## 2023-10-23 DIAGNOSIS — M545 Low back pain, unspecified: Secondary | ICD-10-CM

## 2023-10-24 ENCOUNTER — Encounter: Payer: Self-pay | Admitting: Nurse Practitioner

## 2023-10-24 ENCOUNTER — Ambulatory Visit
Admission: RE | Admit: 2023-10-24 | Discharge: 2023-10-24 | Disposition: A | Discharge status: Home / Self Care | Payer: 59 | Attending: Nurse Practitioner | Admitting: Nurse Practitioner

## 2023-10-24 ENCOUNTER — Other Ambulatory Visit: Payer: Self-pay

## 2023-10-24 ENCOUNTER — Ambulatory Visit
Admission: RE | Admit: 2023-10-24 | Discharge: 2023-10-24 | Disposition: A | Discharge status: Home / Self Care | Payer: 59 | Source: Ambulatory Visit | Attending: Nurse Practitioner | Admitting: Nurse Practitioner

## 2023-10-24 DIAGNOSIS — M545 Low back pain, unspecified: Secondary | ICD-10-CM

## 2023-10-24 DIAGNOSIS — M5137 Other intervertebral disc degeneration, lumbosacral region with discogenic back pain only: Secondary | ICD-10-CM | POA: Diagnosis not present

## 2023-10-24 NOTE — Progress Notes (Signed)
 Contacted via MyChart   Good evening Jennifer Morse, your imaging did return and is showing progression of the degenerative disc to L5-S1, which I suspect is where this pain is causing more issues.  I will place referral as we discussed.  Any questions?

## 2023-10-24 NOTE — Addendum Note (Signed)
 Addended by: Marjie Skiff on: 10/24/2023 06:50 PM   Modules accepted: Orders

## 2023-10-25 ENCOUNTER — Other Ambulatory Visit: Payer: Self-pay

## 2023-10-25 MED ORDER — HYDROCODONE-ACETAMINOPHEN 10-325 MG PO TABS
1.0000 | ORAL_TABLET | Freq: Four times a day (QID) | ORAL | 0 refills | Status: AC | PRN
  Filled 2023-10-25: qty 20, 5d supply, fill #0

## 2023-10-25 NOTE — Addendum Note (Signed)
 Addended by: Aura Dials T on: 10/25/2023 08:13 AM   Modules accepted: Orders

## 2023-10-27 ENCOUNTER — Telehealth: Payer: Self-pay

## 2023-10-27 ENCOUNTER — Other Ambulatory Visit (HOSPITAL_COMMUNITY): Payer: Self-pay

## 2023-10-27 NOTE — Telephone Encounter (Signed)
 Pharmacy Patient Advocate Encounter  Received notification from CoverMyMeds that prior authorization for AJOVY (fremanezumab-vfrm) injection 225MG /1.5ML auto-injectors is required/requested.   Insurance verification completed.   The patient is insured through [facility] .   Per test claim: PA required; PA submitted to above mentioned insurance via CoverMyMeds Key/confirmation #/EOC [id number] Status is pending

## 2023-11-01 NOTE — Progress Notes (Unsigned)
 Referring Physician:  Marjie Skiff, NP [address] [place],  [place] [address]  Primary Physician:  Marjie Skiff, NP  History of Present Illness: 11/02/2023 Jennifer Morse is here today with a chief complaint of more than 10 years of back pain.  She fell on the ice in 2015.  Since that time, she has had primarily left-sided low back pain.  She has never had radiation into the legs.  She has undergone physical therapy multiple times without improvement.  The last course of physical therapy was in 2022.  She is tried multiple medications and many injections over time.  She continues to have severe low back pain as bad as 8 out of 10 when she bends, lifts, walks hills or stairs, turns over in bed, or takes a fall step.  Sleeping helps.  Her pain is dramatically impacting her life.  It is also worse since she had a fall while ice-skating on February 8. Bowel/Bladder Dysfunction: none  Conservative measures:  Physical therapy: has participated in PT around 2022 but this wasn't helpful Multimodal medical therapy including regular antiinflammatories: Flexeril, Gabapentin, Hydrocodone, prednisone Injections:  09/23/20: Left L5-S1 ESI 05/28/20: Left L5-S1 ESI  Past Surgery: no spinal surgeries  Jennifer Morse has no symptoms of cervical myelopathy.  The symptoms are causing a significant impact on the patient's life.   I have utilized the care everywhere function in epic to review the outside records available from external health systems.  Review of Systems:  A 10 point review of systems is negative, except for the pertinent positives and negatives detailed in the HPI.  Past Medical History: Past Medical History:  Diagnosis Date   Anxiety    BRCA negative 07/2021   MyRisk neg; IBIS=11.0%.riskscore=15.8%   Degenerative lumbar disc    Frequent headaches    Gestational thrombocytopenia (HCC)    Lumbar herniated disc    Migraine with aura, intractable, with status  migrainosus 1995   Oligomenorrhea    Postpartum depression    with first pregnancy    Past Surgical History: Past Surgical History:  Procedure Laterality Date   WISDOM TOOTH EXTRACTION  2012    Allergies: Allergies as of 11/02/2023 - Review Complete 11/02/2023  Allergen Reaction Noted   Propranolol  09/13/2023    Medications:  Current Outpatient Medications:    aspirin-acetaminophen-caffeine (EXCEDRIN MIGRAINE) 250-250-65 MG tablet, Take by mouth every 6 (six) hours as needed for headache., Disp: , Rfl:    botulinum toxin Type A (BOTOX) 200 units injection, Provider to inject 155 units into the muscles of the head and neck every 12 weeks. Discard remainder., Disp: 1 each, Rfl: 3   budesonide-formoterol (SYMBICORT) 160-4.5 MCG/ACT inhaler, Inhale 2 puffs into the lungs as needed (Do not take over 12 puffs/day)., Disp: 10.2 g, Rfl: 3   buPROPion (WELLBUTRIN XL) 300 MG 24 hr tablet, Take 1 tablet (300 mg total) by mouth daily., Disp: 90 tablet, Rfl: 4   busPIRone (BUSPAR) 7.5 MG tablet, Take 1 tablet (7.5 mg total) by mouth 2 (two) times daily., Disp: 180 tablet, Rfl: 4   cyclobenzaprine (FLEXERIL) 10 MG tablet, Take 1 tablet (10 mg total) by mouth 3 (three) times daily as needed for muscle spasms. Do not taking if driving or going to work., Disp: 45 tablet, Rfl: 0   Fremanezumab-vfrm (AJOVY) 225 MG/1.5ML SOAJ, Inject 225 mg into the skin every 30 (thirty) days. Please run copay card: BIN# [id number] PCN# [id number] GRP# [id number] ID# [id number], Disp:  1.5 mL, Rfl: 11   gabapentin (NEURONTIN) 600 MG tablet, Take 0.5 tablets (300 mg total) by mouth at bedtime., Disp: 45 tablet, Rfl: 4   hydrocortisone (ANUSOL-HC) 2.5 % rectal cream, Place 1 application. rectally 2 (two) times daily., Disp: 28 g, Rfl: 0   levonorgestrel (MIRENA) 20 MCG/24HR IUD, 1 each by Intrauterine route once., Disp: , Rfl:    lubiprostone (AMITIZA) 24 MCG capsule, Take 1 capsule (24 mcg total) by mouth 2 (two) times daily with a  meal., Disp: 180 capsule, Rfl: 4   Rimegepant Sulfate (NURTEC) 75 MG TBDP, Take 1 tablet (75 mg total) by mouth as needed., Disp: , Rfl:    SUMAtriptan (IMITREX) 100 MG tablet, Take 1 tablet (100 mg total) by mouth every 2 (two) hours as needed for migraine. May repeat in 2 hours if headache persists or recurs., Disp: 9 tablet, Rfl: 12  Current Facility-Administered Medications:    Fremanezumab-vfrm SOSY 225 mg, 225 mg, Subcutaneous, Once,   Social History: Social History   Tobacco Use   Smoking status: Former    Current packs/day: 0.00    Types: Cigarettes    Quit date: 02/26/2015    Years since quitting: 8.6   Smokeless tobacco: Never  Vaping Use   Vaping status: Never Used  Substance Use Topics   Alcohol use: Not Currently    Alcohol/week: 1.0 standard drink of alcohol    Types: 1 Glasses of wine per week   Drug use: No    Family Medical History: Family History  Problem Relation Age of Onset   Migraines Mother    Diabetes Mother    Ovarian cancer Mother    Diabetes Sister    Diabetes Maternal Grandmother    Heart disease Maternal Grandmother    Hypertension Maternal Grandmother    Kidney cancer Maternal Grandmother    Migraines Maternal Grandfather    Diabetes Maternal Grandfather    Heart disease Maternal Grandfather    Hypertension Maternal Grandfather    Diabetes Maternal Aunt    Migraines Child    Migraines Child    Breast cancer Neg Hx    Colon cancer Neg Hx     Physical Examination: Vitals:   11/02/23 1357  BP: 122/76    General: Patient is in no apparent distress. Attention to examination is appropriate.  Neck:   Supple.  Full range of motion.  Respiratory: Patient is breathing without any difficulty.   NEUROLOGICAL:     Awake, alert, oriented to person, place, and time.  Speech is clear and fluent.   Cranial Nerves: Pupils equal round and reactive to light.  Facial tone is symmetric.  Facial sensation is symmetric. Shoulder shrug is  symmetric. Tongue protrusion is midline.  There is no pronator drift.  Strength: Side Biceps Triceps Deltoid Interossei Grip Wrist Ext. Wrist Flex.  R 5 5 5 5 5 5 5   L 5 5 5 5 5 5 5    Side Iliopsoas Quads Hamstring PF DF EHL  R 5 5 5 5 5 5   L 5 5 5 5 5 5    Reflexes are 1+ and symmetric at the biceps, triceps, brachioradialis, patella and achilles.   Hoffman's is absent.   Bilateral upper and lower extremity sensation is intact to light touch.    No evidence of dysmetria noted.  Gait is normal.  She is very hesitant standing from sitting.  She has significant tenderness to palpation over the left SI joint.   Medical Decision Making  Imaging:  L spine xrays 10/24/2023 IMPRESSION: Progressive L5-S1 degenerative disc disease from 2021.     Electronically Signed   By: Narda Rutherford M.D.   On: 10/24/2023 17:09    I have personally reviewed the images and agree with the above interpretation.  Assessment and Plan: Jennifer Morse is a pleasant 33 y.o. female with progressive lumbar spondylosis affecting the L5-S1 disc.  She has severe degenerative disc disease at that level.  She also has clinical symptoms concerning for possible sacroiliitis.  Her findings are progressive compared to her x-rays from several years ago.  Because she has had symptoms for 10 years and they have been worsening, I would recommend that we repeat her MRI scan as her last MRI was 3-1/2 years ago.  This will be utilized for planning purposes for any invasive or noninvasive treatments.  I think her back pain is either from her sacroiliac joint or from her L5-S1 disc degeneration, or from both.  I would like to work this up by obtaining left SI joint injection.  If that helps her pain more than 50%, we will proceed with a second injection.  If that is also successful, she may be a candidate for an SI joint fusion.  I did let her know that we do not perform that procedure at [facility], but that I will take  care of any referrals that are necessary.  If the SI joint injection does not help, I will discuss possible discogram as her degenerative disc disease at L5-S1 may be the offending agent causing her pain.  I have asked her to contact me approximately 2 weeks after her injection so that we can help determine the next step.   I spent a total of 30 minutes in this patient's care today. This time was spent reviewing pertinent records including imaging studies, obtaining and confirming history, performing a directed evaluation, formulating and discussing my recommendations, and documenting the visit within the medical record.    Thank you for involving me in the care of this patient.      Delrose Rohwer K. Myer Haff MD, [facility] Neurosurgery

## 2023-11-01 NOTE — Telephone Encounter (Signed)
 Pharmacy Patient Advocate Encounter  Received notification from [facility] that Prior Authorization for AJOVY (fremanezumab-vfrm) injection 225MG /1.5ML auto-injectors  has been APPROVED from 10/31/2023 to 08/24/2025B   PA #/Case ID/Reference #: [id number]

## 2023-11-02 ENCOUNTER — Ambulatory Visit (INDEPENDENT_AMBULATORY_CARE_PROVIDER_SITE_OTHER): Payer: 59 | Admitting: Neurosurgery

## 2023-11-02 ENCOUNTER — Ambulatory Visit (INDEPENDENT_AMBULATORY_CARE_PROVIDER_SITE_OTHER): Payer: 59 | Admitting: Adult Health

## 2023-11-02 VITALS — BP 122/76 | Ht 68.0 in | Wt 200.0 lb

## 2023-11-02 DIAGNOSIS — G43711 Chronic migraine without aura, intractable, with status migrainosus: Secondary | ICD-10-CM

## 2023-11-02 DIAGNOSIS — G8929 Other chronic pain: Secondary | ICD-10-CM

## 2023-11-02 DIAGNOSIS — M533 Sacrococcygeal disorders, not elsewhere classified: Secondary | ICD-10-CM | POA: Diagnosis not present

## 2023-11-02 DIAGNOSIS — M545 Low back pain, unspecified: Secondary | ICD-10-CM | POA: Diagnosis not present

## 2023-11-02 DIAGNOSIS — M47816 Spondylosis without myelopathy or radiculopathy, lumbar region: Secondary | ICD-10-CM | POA: Diagnosis not present

## 2023-11-02 MED ORDER — NURTEC 75 MG PO TBDP: 75.0000 mg | ORAL_TABLET | ORAL | Status: DC | PRN

## 2023-11-02 MED ORDER — ONABOTULINUMTOXINA 200 UNITS IJ SOLR
155.0000 [IU] | Freq: Once | INTRAMUSCULAR | Status: AC
  Administered 2023-11-02: 155 [IU] via INTRAMUSCULAR

## 2023-11-02 NOTE — Progress Notes (Signed)
 Botox- 200 units x 1 vial Lot: [id number] Expiration: 11/2025 NDC: 5784-6962-95  Bacteriostatic 0.9% Sodium Chloride- * mL  Lot: [id number] Expiration: 11/20/2023 NDC: 4401-0272-53  Dx: G64.403 S/P  Witnessed by Ihor Austin, NP

## 2023-11-02 NOTE — Progress Notes (Signed)
 Update 11/02/2023 JM: Patient is being seen for initial Botox injection.  Currently reports about 1-2 migraines per week, migraine can last hours, can have dull headache the following day. Has done 2 Ajovy injections so far but no significant improvement.  She does have lower back and neck pain which can trigger migraines.  Current use of sumatriptan as needed but no significant benefit.  She has previously tried rizatriptan, Zomig and Relpax for rescue without benefit.  Nurtec samples provided today, will call if beneficial for official prescription.  Tolerated procedure well today.  Return in 3 months for repeat injection.       Consent Form Botulism Toxin Injection For Chronic Migraine    Reviewed orally with patient, additionally signature is on file:  Botulism toxin has been approved by the Federal drug administration for treatment of chronic migraine. Botulism toxin does not cure chronic migraine and it may not be effective in some patients.  The administration of botulism toxin is accomplished by injecting a small amount of toxin into the muscles of the neck and head. Dosage must be titrated for each individual. Any benefits resulting from botulism toxin tend to wear off after 3 months with a repeat injection required if benefit is to be maintained. Injections are usually done every 3-4 months with maximum effect peak achieved by about 2 or 3 weeks. Botulism toxin is expensive and you should be sure of what costs you will incur resulting from the injection.  The side effects of botulism toxin use for chronic migraine may include:   -Transient, and usually mild, facial weakness with facial injections  -Transient, and usually mild, head or neck weakness with head/neck injections  -Reduction or loss of forehead facial animation due to forehead muscle weakness  -Eyelid drooping  -Dry eye  -Pain at the site of injection or bruising at the site of injection  -Double  vision  -Potential unknown long term risks   Contraindications: You should not have Botox if you are pregnant, nursing, allergic to albumin, have an infection, skin condition, or muscle weakness at the site of the injection, or have myasthenia gravis, Lambert-Eaton syndrome, or ALS.  It is also possible that as with any injection, there may be an allergic reaction or no effect from the medication. Reduced effectiveness after repeated injections is sometimes seen and rarely infection at the injection site may occur. All care will be taken to prevent these side effects. If therapy is given over a long time, atrophy and wasting in the muscle injected may occur. Occasionally the patient's become refractory to treatment because they develop antibodies to the toxin. In this event, therapy needs to be modified.  I have read the above information and consent to the administration of botulism toxin.    BOTOX PROCEDURE NOTE FOR MIGRAINE HEADACHE  Contraindications and precautions discussed with patient(above). Aseptic procedure was observed and patient tolerated procedure. Procedure performed by Ihor Austin, AGNP-BC.   The condition has existed for more than 6 months, and pt does not have a diagnosis of ALS, Myasthenia Gravis or Lambert-Eaton Syndrome.  Risks and benefits of injections discussed and pt agrees to proceed with the procedure.  Written consent obtained  These injections are medically necessary. Pt  receives good benefits from these injections. These injections do not cause sedations or hallucinations which the oral therapies may cause.   Description of procedure:  The patient was placed in a sitting position. The standard protocol was used for Botox as follows,  with 5 units of Botox injected at each site:  -Procerus muscle, midline injection  -Corrugator muscle, bilateral injection  -Frontalis muscle, bilateral injection, with 2 sites each side, medial injection was performed in the  upper one third of the frontalis muscle, in the region vertical from the medial inferior edge of the superior orbital rim. The lateral injection was again in the upper one third of the forehead vertically above the lateral limbus of the cornea, 1.5 cm lateral to the medial injection site.  -Temporalis muscle injection, 4 sites, bilaterally. The first injection was 3 cm above the tragus of the ear, second injection site was 1.5 cm to 3 cm up from the first injection site in line with the tragus of the ear. The third injection site was 1.5-3 cm forward between the first 2 injection sites. The fourth injection site was 1.5 cm posterior to the second injection site. 5th site laterally in the temporalis  muscleat the level of the outer canthus.  -Occipitalis muscle injection, 3 sites, bilaterally. The first injection was done one half way between the occipital protuberance and the tip of the mastoid process behind the ear. The second injection site was done lateral and superior to the first, 1 fingerbreadth from the first injection. The third injection site was 1 fingerbreadth superiorly and medially from the first injection site.  -Cervical paraspinal muscle injection, 2 sites, bilaterally. The first injection site was 1 cm from the midline of the cervical spine, 3 cm inferior to the lower border of the occipital protuberance. The second injection site was 1.5 cm superiorly and laterally to the first injection site.  -Trapezius muscle injection was performed at 3 sites, bilaterally. The first injection site was in the upper trapezius muscle halfway between the inflection point of the neck, and the acromion. The second injection site was one half way between the acromion and the first injection site. The third injection was done between the first injection site and the inflection point of the neck.    A total of 200 units of Botox was prepared, 155 units of Botox was injected as documented above, any Botox  not injected was wasted. The patient tolerated the procedure well, there were no complications of the above procedure.   Ihor Austin, AGNP-BC  [facility] [address] [place], [place] [address]  Phone [phone] Fax [phone] Note: This document was prepared with digital dictation and possible smart phrase technology. Any transcriptional errors that result from this process are unintentional.

## 2023-11-03 ENCOUNTER — Other Ambulatory Visit: Payer: Self-pay

## 2023-11-03 MED FILL — Gabapentin Tab 600 MG: ORAL | 90 days supply | Qty: 45 | Fill #2 | Status: AC

## 2023-11-06 ENCOUNTER — Ambulatory Visit
Admission: RE | Admit: 2023-11-06 | Discharge: 2023-11-06 | Disposition: A | Discharge status: Home / Self Care | Payer: 59 | Source: Ambulatory Visit | Attending: Neurosurgery | Admitting: Neurosurgery

## 2023-11-06 DIAGNOSIS — M545 Low back pain, unspecified: Secondary | ICD-10-CM

## 2023-11-06 DIAGNOSIS — M47816 Spondylosis without myelopathy or radiculopathy, lumbar region: Secondary | ICD-10-CM | POA: Diagnosis not present

## 2023-11-06 DIAGNOSIS — M5127 Other intervertebral disc displacement, lumbosacral region: Secondary | ICD-10-CM | POA: Diagnosis not present

## 2023-11-06 DIAGNOSIS — G8929 Other chronic pain: Secondary | ICD-10-CM | POA: Diagnosis not present

## 2023-11-06 DIAGNOSIS — M5137 Other intervertebral disc degeneration, lumbosacral region with discogenic back pain only: Secondary | ICD-10-CM | POA: Diagnosis not present

## 2023-11-06 DIAGNOSIS — M5136 Other intervertebral disc degeneration, lumbar region with discogenic back pain only: Secondary | ICD-10-CM | POA: Diagnosis not present

## 2023-11-06 DIAGNOSIS — M533 Sacrococcygeal disorders, not elsewhere classified: Secondary | ICD-10-CM | POA: Diagnosis not present

## 2023-11-06 DIAGNOSIS — M549 Dorsalgia, unspecified: Secondary | ICD-10-CM | POA: Diagnosis not present

## 2023-11-13 ENCOUNTER — Other Ambulatory Visit (HOSPITAL_COMMUNITY): Payer: Self-pay

## 2023-11-14 ENCOUNTER — Encounter: Payer: Self-pay | Admitting: Neurosurgery

## 2023-11-15 ENCOUNTER — Encounter: Payer: Self-pay | Admitting: Pain Medicine

## 2023-11-15 ENCOUNTER — Ambulatory Visit (HOSPITAL_BASED_OUTPATIENT_CLINIC_OR_DEPARTMENT_OTHER): Admitting: Pain Medicine

## 2023-11-15 ENCOUNTER — Ambulatory Visit
Admission: RE | Admit: 2023-11-15 | Discharge: 2023-11-15 | Disposition: A | Discharge status: Home / Self Care | Source: Ambulatory Visit | Attending: Pain Medicine | Admitting: Pain Medicine

## 2023-11-15 VITALS — BP 118/84 | HR 88 | Temp 98.1°F | Resp 16 | Ht 68.0 in | Wt 194.0 lb

## 2023-11-15 DIAGNOSIS — M5126 Other intervertebral disc displacement, lumbar region: Secondary | ICD-10-CM

## 2023-11-15 DIAGNOSIS — M5459 Other low back pain: Secondary | ICD-10-CM | POA: Insufficient documentation

## 2023-11-15 DIAGNOSIS — M25551 Pain in right hip: Secondary | ICD-10-CM | POA: Insufficient documentation

## 2023-11-15 DIAGNOSIS — M5137 Other intervertebral disc degeneration, lumbosacral region with discogenic back pain only: Secondary | ICD-10-CM

## 2023-11-15 DIAGNOSIS — M4310 Spondylolisthesis, site unspecified: Secondary | ICD-10-CM | POA: Insufficient documentation

## 2023-11-15 DIAGNOSIS — M899 Disorder of bone, unspecified: Secondary | ICD-10-CM | POA: Diagnosis not present

## 2023-11-15 DIAGNOSIS — M5127 Other intervertebral disc displacement, lumbosacral region: Secondary | ICD-10-CM | POA: Insufficient documentation

## 2023-11-15 DIAGNOSIS — M545 Low back pain, unspecified: Secondary | ICD-10-CM | POA: Insufficient documentation

## 2023-11-15 DIAGNOSIS — G8929 Other chronic pain: Secondary | ICD-10-CM | POA: Insufficient documentation

## 2023-11-15 DIAGNOSIS — M25552 Pain in left hip: Secondary | ICD-10-CM | POA: Diagnosis not present

## 2023-11-15 DIAGNOSIS — Z79899 Other long term (current) drug therapy: Secondary | ICD-10-CM | POA: Insufficient documentation

## 2023-11-15 DIAGNOSIS — M5451 Vertebrogenic low back pain: Secondary | ICD-10-CM

## 2023-11-15 DIAGNOSIS — G894 Chronic pain syndrome: Secondary | ICD-10-CM

## 2023-11-15 DIAGNOSIS — M47816 Spondylosis without myelopathy or radiculopathy, lumbar region: Secondary | ICD-10-CM

## 2023-11-15 DIAGNOSIS — Z789 Other specified health status: Secondary | ICD-10-CM | POA: Insufficient documentation

## 2023-11-15 DIAGNOSIS — M4807 Spinal stenosis, lumbosacral region: Secondary | ICD-10-CM | POA: Diagnosis not present

## 2023-11-15 DIAGNOSIS — R937 Abnormal findings on diagnostic imaging of other parts of musculoskeletal system: Secondary | ICD-10-CM

## 2023-11-15 DIAGNOSIS — E559 Vitamin D deficiency, unspecified: Secondary | ICD-10-CM | POA: Diagnosis not present

## 2023-11-15 DIAGNOSIS — M25651 Stiffness of right hip, not elsewhere classified: Secondary | ICD-10-CM | POA: Insufficient documentation

## 2023-11-15 DIAGNOSIS — M4316 Spondylolisthesis, lumbar region: Secondary | ICD-10-CM

## 2023-11-15 MED ORDER — METHOCARBAMOL 1000 MG/10ML IJ SOLN
200.0000 mg | Freq: Once | INTRAMUSCULAR | Status: AC
  Administered 2023-11-15: 200 mg via INTRAMUSCULAR

## 2023-11-15 MED ORDER — METHOCARBAMOL 1000 MG/10ML IJ SOLN
INTRAMUSCULAR | Status: AC
  Filled 2023-11-15: qty 10

## 2023-11-15 MED ORDER — KETOROLAC TROMETHAMINE 60 MG/2ML IM SOLN
60.0000 mg | Freq: Once | INTRAMUSCULAR | Status: AC
Start: 2023-11-15 — End: 2023-11-15
  Administered 2023-11-15: 60 mg via INTRAMUSCULAR

## 2023-11-15 MED ORDER — KETOROLAC TROMETHAMINE 30 MG/ML IJ SOLN
INTRAMUSCULAR | Status: AC
Start: 2023-11-15 — End: ?
  Filled 2023-11-15: qty 1

## 2023-11-15 MED ORDER — KETOROLAC TROMETHAMINE 60 MG/2ML IM SOLN
INTRAMUSCULAR | Status: AC
  Filled 2023-11-15: qty 2

## 2023-11-15 NOTE — Progress Notes (Signed)
 Safety precautions to be maintained throughout the outpatient stay will include: orient to surroundings, keep bed in low position, maintain call bell within reach at all times, provide assistance with transfer out of bed and ambulation.

## 2023-11-15 NOTE — Progress Notes (Signed)
 Patient: Jennifer Morse  Service Category: E/M  Provider: Oswaldo Done, MD  DOB: Sep 05, 1991  DOS: 11/15/2023  Referring Provider: Marjie Skiff, NP  MRN: [id number]  Setting: Ambulatory outpatient  PCP: Marjie Skiff, NP  Type: New Patient  Specialty: Interventional Pain Management    Location: Office  Delivery: Face-to-face     Primary Reason(s) for Visit: Encounter for initial evaluation of one or more chronic problems (new to examiner) potentially causing chronic pain, and posing a threat to normal musculoskeletal function. (Level of risk: High) CC: Back Pain (Lumbar bilateral, left is worse ) and Migraine  HPI  Ms. Albert is a 33 y.o. year old, female patient, who comes for the first time to our practice referred by Marjie Skiff, NP for our initial evaluation of her chronic pain. She has Migraine; Chronic anal fissure; External thrombosed hemorrhoids; Overweight (BMI 25.0-29.9); IUD (intrauterine device) in place; History of kidney stones; Family history of ovarian cancer; Anxiety and depression; Hypothyroidism; Thyroid nodule; Grade II internal hemorrhoids; Anal sphincter spasm; Vitamin D deficiency; Chronic constipation; Nonspecific syndrome suggestive of viral illness; Acute on chronic low back pain (Bilateral) (L>R); Chronic pain syndrome; Pharmacologic therapy; Disorder of skeletal system; Problems influencing health status; Abnormal MRI, lumbar spine (11/06/2023); Lumbosacral intervertebral disc herniation (L5-S1); Lumbar disc herniation (L3-4); Lumbar intervertebral disc protrusion (L3-4); Lumbar facet hypertrophy; Chronic low back pain (1ry area of Pain) (Bilateral) (L>R) w/o sciatica; Vertebrogenic low back pain; Low back pain of over 3 months duration; Mechanical low back pain; Multifactorial low back pain; Lumbar facet joint pain (Bilateral) (R>L); Chronic hip pain (2ry area of Pain) (Bilateral) (R>L); Grade 1 Lumbar Retrolisthesis of L3/L4 & L5/S1; DDD (degenerative  disc disease), lumbosacral (L5-S1) w/ LBP; and Impaired range of motion of hip (Right) on their problem list. Today she comes in for evaluation of her Back Pain (Lumbar bilateral, left is worse ) and Migraine  Pain Assessment: Location: Lower, Right, Left (left side worse) Back Radiating: to left buttock Onset: More than a month ago Duration: Chronic pain Quality: Aching, Sharp Severity: 8 /10 (subjective, self-reported pain score)  Effect on ADL: difficult to bend, stand for long periods, difficult to rest Timing: Constant Modifying factors: nothing helps BP: 118/84  HR: 88  Onset and Duration: Gradual and Present longer than 3 months Cause of pain:  Fell on ice Feb 8th, 2025 Severity: Getting worse, NAS-11 at its worse: 8/10, NAS-11 at its best: 6/10, NAS-11 now: 8/10, and NAS-11 on the average: 8/10 Timing: Morning, Afternoon, During activity or exercise, After activity or exercise, and After a period of immobility Aggravating Factors: Bending, Climbing, Intercourse (sex), Kneeling, Lifiting, Motion, Prolonged sitting, Prolonged standing, Twisting, Walking, Walking uphill, Walking downhill, Working, and coughing Alleviating Factors: Resting and Warm showers or baths Associated Problems: Depression, Nausea, Sadness, Weakness, Pain that wakes patient up, and Pain that does not allow patient to sleep Quality of Pain: Aching, Annoying, Disabling, Exhausting, Feeling of constriction, and Sharp Previous Examinations or Tests: MRI scan, X-rays, Neurological evaluation, and Neurosurgical evaluation Previous Treatments: The patient denies treatments.  Ms. Huguley is being evaluated for possible interventional pain management therapies for the treatment of her chronic pain.  Discussed the use of AI scribe software for clinical note transcription with the patient, who gave verbal consent to proceed.  History of Present Illness   The patient presents with chronic back pain exacerbated by a  recent fall.  She has experienced chronic back pain for at least ten years, which  was significantly exacerbated by a fall on October 14, 2023. The fall resulted in the worst pain she has ever experienced, with a daily pain rating of at least 7 out of 10. The pain is primarily located in the lower back, more on the left side, and has increased in intensity since the fall. Previously, her pain was around 4 to 5 out of 10. The pain radiates to her left buttock but does not extend to her legs. No leg pain, numbness, or weakness below the knees.  She has managed her chronic back pain with epidural injections, physical therapy, acupuncture, and steroids. Following the fall, she underwent x-rays and an MRI, and received oral prednisone and a steroid injection into the muscle, none of which provided relief. The prednisone was taken over a 12-day tapering course. She has not had any recent physical therapy or chiropractic adjustments due to her back history, and she has not undergone any back surgeries or nerve blocks, though she has had epidural injections in the past, which were not significantly helpful.  The pain is exacerbated by movement, bending, and rolling over in bed. She finds it difficult to maintain any position for too long, needing to alternate between sitting and standing. Activities such as washing dishes cause her back to tighten, requiring her to take breaks. The pain is described as being more intense on the left side of her lower back and buttock area.  During the review of symptoms, standing on her left leg and hyperextending her back increases the pain. No allergies or kidney problems.      Today, during the precharting review of records, I took the time to personally look at the images from the lumbar MRI done on 11/06/2023, ordered by Dr. Volanda Napoleon.  Although the official report is still pending, there is evidence of lumbar degenerative disc disease affecting the L3-4, L4-5, and  L5-S1 with the most significant changes seen at L5-S1 where there is loss of disc height and Modic type changes in the lower endplate of L5 and (plate of S1 with a grade 1 retrolisthesis of L5 over S1.  There is disc desiccation with black disc disease at L3-4 and L4-5.  An HIZ (high intensity zone) is seen in the posterior aspect of the L4-5 disc suggestive of a disc tear/fissure.  Bilateral facet joint arthropathy/hypertrophy can be seen at L3-4, L4-5, and L5-S1 levels.  Again this is up-to-date immunity evaluation and the official report from the radiologist is pending.  By the time the patient arrived to our office in the afternoon radiology had read the MRI images indicating: (11/06/2023) LUMBAR MRI FINDINGS: Alignment:  Slight retrolisthesis is present at L3-4 and L5-S1. Vertebrae: Mixed type 1 and type 2 Modic changes at L5-S1 have progressed.  DISC LEVELS: L3-4: Disc desiccation and a central annular tear are noted.  L4-5: Disc desiccation is present. Central annular tear and mild disc bulging is present. L5-S1: A shallow central disc protrusion is slightly asymmetric to the left. Chronic loss of disc height.   IMPRESSION: 1. Progressive degenerative disc disease at L5-S1 with mixed type 1 and type 2 Modic changes. 2. Shallow central disc protrusion at L5-S1 is slightly asymmetric to the left. 3. Central annular tear and mild disc bulging at L4-5 without significant stenosis. 4. Central annular tear at L3-4 without significant stenosis.  Ms. Hulce has been informed that this initial visit was an evaluation only.  On the follow up appointment I will go over the results, including  ordered tests and available interventional therapies. At that time she will have the opportunity to decide whether to proceed with offered therapies or not. In the event that Ms. Leoni prefers avoiding interventional options, this will conclude our involvement in the case.  Medication management recommendations  may be provided upon request.  Patient informed that diagnostic tests may be ordered to assist in identifying underlying causes, narrow the list of differential diagnoses and aid in determining candidacy for (or contraindications to) planned therapeutic interventions.  Historic Controlled Substance Pharmacotherapy Review  PMP and historical list of controlled substances: Gabapentin 600 mg tablet (# 45) (last filled on 11/03/2023); hydrocodone/APAP 10/325, 1 tab p.o. 4 times daily (# 20) (last filled on 10/25/2023); hydrocodone/chloramphenicol ER suspension (# 17) (last filled on 08/31/2022); alprazolam 0.5 mg tablet, 1 tab p.o. twice daily (# 30) (last filled on 04/20/2022) Most recently prescribed opioid analgesics: hydrocodone/APAP 10/325, 1 tab p.o. 4 times daily  MME/day: 40 mg/day  Historical Monitoring: The patient  reports no history of drug use. List of prior UDS Testing: No results found for: "MDMA", "COCAINSCRNUR", "PCPSCRNUR", "PCPQUANT", "CANNABQUANT", "THCU", "ETH", "CBDTHCR", "D8THCCBX", "D9THCCBX" Historical Background Evaluation: [place] PMP: PDMP reviewed during this encounter. Review of the past 84-months conducted.             PMP NARX Score Report:  Narcotic: 220 Sedative: 130 Stimulant: 000 [place] Department of public safety, offender search: Engineer, mining Information) Non-contributory Risk Assessment Profile: Aberrant behavior: None observed or detected today Risk factors for fatal opioid overdose: None identified today PMP NARX Overdose Risk Score: 320 Fatal overdose hazard ratio (HR): Calculation deferred Non-fatal overdose hazard ratio (HR): Calculation deferred Risk of opioid abuse or dependence: 0.7-3.0% with doses <= 36 MME/day and 6.1-26% with doses >= 120 MME/day. Substance use disorder (SUD) risk level: See below Personal History of Substance Abuse (SUD-Substance use disorder):  Alcohol:    Illegal Drugs:    Rx Drugs:    ORT Risk Level calculation:    ORT Scoring  interpretation table:  Score <3 = Low Risk for SUD  Score between 4-7 = Moderate Risk for SUD  Score >8 = High Risk for Opioid Abuse   PHQ-2 Depression Scale:  Total score: 4  PHQ-2 Scoring interpretation table: (Score and probability of major depressive disorder)  Score 0 = No depression  Score 1 = 15.4% Probability  Score 2 = 21.1% Probability  Score 3 = 38.4% Probability  Score 4 = 45.5% Probability  Score 5 = 56.4% Probability  Score 6 = 78.6% Probability   PHQ-9 Depression Scale:  Total score: 4  PHQ-9 Scoring interpretation table:  Score 0-4 = No depression  Score 5-9 = Mild depression  Score 10-14 = Moderate depression  Score 15-19 = Moderately severe depression  Score 20-27 = Severe depression (2.4 times higher risk of SUD and 2.89 times higher risk of overuse)   Pharmacologic Plan: As per protocol, I have not taken over any controlled substance management, pending the results of ordered tests and/or consults.            Initial impression: Pending review of available data and ordered tests.  Meds   Current Outpatient Medications:    aspirin-acetaminophen-caffeine (EXCEDRIN MIGRAINE) 250-250-65 MG tablet, Take by mouth every 6 (six) hours as needed for headache., Disp: , Rfl:    botulinum toxin Type A (BOTOX) 200 units injection, Provider to inject 155 units into the muscles of the head and neck every 12 weeks. Discard remainder., Disp: 1 each, Rfl:  3   budesonide-formoterol (SYMBICORT) 160-4.5 MCG/ACT inhaler, Inhale 2 puffs into the lungs as needed (Do not take over 12 puffs/day)., Disp: 10.2 g, Rfl: 3   buPROPion (WELLBUTRIN XL) 300 MG 24 hr tablet, Take 1 tablet (300 mg total) by mouth daily., Disp: 90 tablet, Rfl: 4   busPIRone (BUSPAR) 7.5 MG tablet, Take 1 tablet (7.5 mg total) by mouth 2 (two) times daily., Disp: 180 tablet, Rfl: 4   Fremanezumab-vfrm (AJOVY) 225 MG/1.5ML SOAJ, Inject 225 mg into the skin every 30 (thirty) days. Please run copay card: BIN# [id number]  PCN# [id number] GRP# [id number] ID# [id number], Disp: 1.5 mL, Rfl: 11   gabapentin (NEURONTIN) 600 MG tablet, Take 0.5 tablets (300 mg total) by mouth at bedtime., Disp: 45 tablet, Rfl: 4   hydrocortisone (ANUSOL-HC) 2.5 % rectal cream, Place 1 application. rectally 2 (two) times daily., Disp: 28 g, Rfl: 0   levonorgestrel (MIRENA) 20 MCG/24HR IUD, 1 each by Intrauterine route once., Disp: , Rfl:    lubiprostone (AMITIZA) 24 MCG capsule, Take 1 capsule (24 mcg total) by mouth 2 (two) times daily with a meal., Disp: 180 capsule, Rfl: 4   Rimegepant Sulfate (NURTEC) 75 MG TBDP, Take 1 tablet (75 mg total) by mouth as needed., Disp: , Rfl:    SUMAtriptan (IMITREX) 100 MG tablet, Take 1 tablet (100 mg total) by mouth every 2 (two) hours as needed for migraine. May repeat in 2 hours if headache persists or recurs., Disp: 9 tablet, Rfl: 12  Current Facility-Administered Medications:    Fremanezumab-vfrm SOSY 225 mg, 225 mg, Subcutaneous, Once,   Imaging Review  Lumbosacral Imaging: (11/06/2023) LUMBAR MRI FINDINGS: Alignment:  Slight retrolisthesis is present at L3-4 and L5-S1. Vertebrae: Mixed type 1 and type 2 Modic changes at L5-S1 have progressed.  DISC LEVELS: L3-4: Disc desiccation and a central annular tear are noted.  L4-5: Disc desiccation is present. Central annular tear and mild disc bulging is present. L5-S1: A shallow central disc protrusion is slightly asymmetric to the left. Chronic loss of disc height.   IMPRESSION: 1. Progressive degenerative disc disease at L5-S1 with mixed type 1 and type 2 Modic changes. 2. Shallow central disc protrusion at L5-S1 is slightly asymmetric to the left. 3. Central annular tear and mild disc bulging at L4-5 without significant stenosis. 4. Central annular tear at L3-4 without significant stenosis.   Electronically Signed   By: Marin Roberts M.D.   On: 11/15/2023 07:24   Lumbar MR wo contrast: Results for orders placed during the hospital  encounter of 05/21/20 MR Lumbar Spine Wo Contrast  Narrative CLINICAL DATA:  32 year old female with persistent low back pain radiating to the buttocks.  EXAM: MRI LUMBAR SPINE WITHOUT CONTRAST  TECHNIQUE: Multiplanar, multisequence MR imaging of the lumbar spine was performed. No intravenous contrast was administered.  COMPARISON:  Lumbar radiographs 05/04/2020.  FINDINGS: Segmentation:  Normal on the comparison.  Alignment:  Stable lumbar lordosis.  No spondylolisthesis.  Vertebrae: Chronic degenerative endplate marrow signal changes at L5-S1. Normal background bone marrow signal. No marrow edema or evidence of acute osseous abnormality. Intact visible sacrum and SI joints.  Conus medullaris and cauda equina: Conus extends to the L1 level. No lower spinal cord or conus signal abnormality.  Paraspinal and other soft tissues: Negative.  Disc levels:  The visible lower thoracic levels through L2-L3 are negative.  L3-L4: Mild disc desiccation and broad-based but small posterior disc protrusion (series 7, image 24) mild facet hypertrophy. No significant stenosis.  L4-L5:  Disc desiccation. Subtle posterior annular fissure of the disc. Mild facet hypertrophy. No stenosis.  L5-S1: Disc desiccation and disc space loss. Small to moderate broad-based central to slightly left paracentral disc extrusion (series 2, image 10 and series 7, image 36). This abuts the descending S1 nerve roots in the lateral recess. No significant spinal stenosis. No definite foraminal involvement.  IMPRESSION: Lower lumbar disc degeneration, with small disc herniations at L3-L4 and L5-S1. Disc at L5-S1 abuts the descending left S1 nerve roots in the lateral recesses. No significant spinal stenosis.   Electronically Signed By: Odessa Fleming M.D. On: 05/21/2020 20:56  Lumbar DG (Complete) 4+V: Results for orders placed during the hospital encounter of 10/24/23 DG Lumbar Spine  Complete  Narrative CLINICAL DATA:  Ongoing chronic low back pain, worsening. Recent fall.  EXAM: LUMBAR SPINE - COMPLETE 4+ VIEW  COMPARISON:  Lumbar radiograph 05/04/2020, MRI 05/21/2020  FINDINGS: Five non-rib-bearing lumbar vertebra. No acute fracture. Progressive L5-S1 degenerative disc disease from 2021. Remaining disc spaces are normal. No fracture. No visible pars defects. Vertebral body heights are normal. The alignment is normal. Sacroiliac joints are congruent. IUD in the pelvis.  IMPRESSION: Progressive L5-S1 degenerative disc disease from 2021.   Electronically Signed By: Narda Rutherford M.D. On: 10/24/2023 17:09  Wrist Imaging: Wrist-L DG Complete: Results for orders placed in visit on 08/12/01 DG Wrist Complete Left  Narrative FINDINGS CLINICAL DATA:    INJURY. LEFT WRIST 4 VIEWS THERE IS A BUCKLE FRACTURE OF THE DISTAL RADIUS AT THE DIAMETAPHYSEAL JUNCTION.  THIS IS ASSOCIATED WITH SLIGHT ANTERIOR DISPLACEMENT ON THE LATERAL VIEW.  THE POSTERIOR CORTEX APPEARS INTACT.  I SUSPECT THERE IS A MINIMAL BUCKLE FRACTURE OF THE DISTAL ULNAR METAPHYSIS AS WELL.  GROWTH PLATES REMAIN OPEN, BUT NOT WIDENED.  THERE IS NO EVIDENCE FOR CARPAL BONE INJURY. IMPRESSION TORUS FRACTURES OF THE DISTAL RADIUS AND ULNA AS DESCRIBED.  Complexity Note: Imaging results reviewed.                         ROS  Cardiovascular: No reported cardiovascular signs or symptoms such as High blood pressure, coronary artery disease, abnormal heart rate or rhythm, heart attack, blood thinner therapy or heart weakness and/or failure Pulmonary or Respiratory: No reported pulmonary signs or symptoms such as wheezing and difficulty taking a deep full breath (Asthma), difficulty blowing air out (Emphysema), coughing up mucus (Bronchitis), persistent dry cough, or temporary stoppage of breathing during sleep Neurological: No reported neurological signs or symptoms such as seizures, abnormal skin  sensations, urinary and/or fecal incontinence, being born with an abnormal open spine and/or a tethered spinal cord Psychological-Psychiatric: Anxiousness and Depressed Gastrointestinal: Irregular, infrequent bowel movements (Constipation) Genitourinary: No reported renal or genitourinary signs or symptoms such as difficulty voiding or producing urine, peeing blood, non-functioning kidney, kidney stones, difficulty emptying the bladder, difficulty controlling the flow of urine, or chronic kidney disease Hematological: No reported hematological signs or symptoms such as prolonged bleeding, low or poor functioning platelets, bruising or bleeding easily, hereditary bleeding problems, low energy levels due to low hemoglobin or being anemic Endocrine: High thyroid Rheumatologic: No reported rheumatological signs and symptoms such as fatigue, joint pain, tenderness, swelling, redness, heat, stiffness, decreased range of motion, with or without associated rash Musculoskeletal: Negative for myasthenia gravis, muscular dystrophy, multiple sclerosis or malignant hyperthermia Work History: Working full time  Allergies  Ms. Marsalis is allergic to propranolol.  Laboratory Chemistry Profile   Renal Lab Results  Component Value Date   BUN 15 09/13/2023   CREATININE 0.82 09/13/2023   BCR 18 09/13/2023   GFR 94.11 08/19/2020   GFRAA 120 03/02/2018   GFRNONAA >60 11/21/2021   SPECGRAV 1.015 06/23/2015   PHUR 6.0 06/23/2015   PROTEINUR NEGATIVE 11/15/2022     Electrolytes Lab Results  Component Value Date   NA 141 09/13/2023   K 3.8 09/13/2023   CL 102 09/13/2023   CALCIUM 9.6 09/13/2023     Hepatic Lab Results  Component Value Date   AST 19 09/13/2023   ALT 25 09/13/2023   ALBUMIN 4.4 09/13/2023   ALKPHOS 86 09/13/2023   LIPASE 34 11/21/2021     ID Lab Results  Component Value Date   HIV Non Reactive 04/28/2017   SARSCOV2NAA NEGATIVE 05/15/2023   PREGTESTUR Positive (A) 11/24/2016      Bone Lab Results  Component Value Date   VD25OH 30.7 10/19/2022   TESTOFREE 0.7 11/27/2020   TESTOSTERONE 16 11/27/2020     Endocrine Lab Results  Component Value Date   GLUCOSE 89 09/13/2023   GLUCOSEU NEGATIVE 11/15/2022   HGBA1C 5.5 08/13/2021   TSH 2.000 09/13/2023   FREET4 0.92 09/19/2022   TESTOFREE 0.7 11/27/2020   TESTOSTERONE 16 11/27/2020   SHBG 54.7 11/27/2020     Neuropathy Lab Results  Component Value Date   VITAMINB12 305 01/17/2022   FOLATE 12.5 10/06/2017   HGBA1C 5.5 08/13/2021   HIV Non Reactive 04/28/2017     CNS No results found for: "COLORCSF", "APPEARCSF", "RBCCOUNTCSF", "WBCCSF", "POLYSCSF", "LYMPHSCSF", "EOSCSF", "PROTEINCSF", "GLUCCSF", "JCVIRUS", "CSFOLI", "IGGCSF", "LABACHR", "ACETBL"   Inflammation (CRP: Acute  ESR: Chronic) Lab Results  Component Value Date   CRP 2.5 05/13/2020   ESRSEDRATE 15 08/19/2020     Rheumatology Lab Results  Component Value Date   RF <14 05/13/2020   ANA NEGATIVE 05/13/2020   LABURIC 6.2 05/13/2020     Coagulation Lab Results  Component Value Date   PLT 156 09/13/2023     Cardiovascular Lab Results  Component Value Date   CKTOTAL 64 08/19/2020   HGB 12.9 09/13/2023   HCT 38.9 09/13/2023     Screening Lab Results  Component Value Date   SARSCOV2NAA NEGATIVE 05/15/2023   HIV Non Reactive 04/28/2017   PREGTESTUR Positive (A) 11/24/2016     Cancer No results found for: "CEA", "CA125", "LABCA2"   Allergens No results found for: "ALMOND", "APPLE", "ASPARAGUS", "AVOCADO", "BANANA", "BARLEY", "BASIL", "BAYLEAF", "GREENBEAN", "LIMABEAN", "WHITEBEAN", "BEEFIGE", "REDBEET", "BLUEBERRY", "BROCCOLI", "CABBAGE", "MELON", "CARROT", "CASEIN", "CASHEWNUT", "CAULIFLOWER", "CELERY"     Note: Lab results reviewed.  PFSH  Drug: Ms. Kellett  reports no history of drug use. Alcohol:  reports that she does not currently use alcohol after a past usage of about 1.0 standard drink of alcohol per  week. Tobacco:  reports that she quit smoking about 8 years ago. Her smoking use included cigarettes. She has never used smokeless tobacco. Medical:  has a past medical history of Anxiety, BRCA negative (07/2021), Degenerative lumbar disc, Frequent headaches, Gestational thrombocytopenia (HCC), Lumbar herniated disc, Migraine with aura, intractable, with status migrainosus (1995), Oligomenorrhea, and Postpartum depression. Family: family history includes Diabetes in her maternal aunt, maternal grandfather, maternal grandmother, mother, and sister; Heart disease in her maternal grandfather and maternal grandmother; Hypertension in her maternal grandfather and maternal grandmother; Kidney cancer in her maternal grandmother; Migraines in her child, child, maternal grandfather, and mother; Ovarian cancer in her mother.  Past Surgical History:  Procedure Laterality Date  WISDOM TOOTH EXTRACTION  2012   Active Ambulatory Problems    Diagnosis Date Noted   Migraine 12/13/2013   Chronic anal fissure 12/29/2016   External thrombosed hemorrhoids 05/04/2017   Overweight (BMI 25.0-29.9) 08/06/2018   IUD (intrauterine device) in place 02/27/2020   History of kidney stones 08/19/2020   Family history of ovarian cancer 08/02/2021   Anxiety and depression 08/12/2015   Hypothyroidism 09/08/2017   Thyroid nodule 01/17/2022   Grade II internal hemorrhoids 01/21/2022   Anal sphincter spasm 05/24/2022   Vitamin D deficiency 10/16/2022   Chronic constipation 10/19/2022   Nonspecific syndrome suggestive of viral illness 05/15/2023   Acute on chronic low back pain (Bilateral) (L>R) 10/17/2023   Chronic pain syndrome 11/15/2023   Pharmacologic therapy 11/15/2023   Disorder of skeletal system 11/15/2023   Problems influencing health status 11/15/2023   Abnormal MRI, lumbar spine (11/06/2023) 11/15/2023   Lumbosacral intervertebral disc herniation (L5-S1) 11/15/2023   Lumbar disc herniation (L3-4) 11/15/2023    Lumbar intervertebral disc protrusion (L3-4) 11/15/2023   Lumbar facet hypertrophy 11/15/2023   Chronic low back pain (1ry area of Pain) (Bilateral) (L>R) w/o sciatica 11/15/2023   Vertebrogenic low back pain 11/15/2023   Low back pain of over 3 months duration 11/15/2023   Mechanical low back pain 11/15/2023   Multifactorial low back pain 11/15/2023   Lumbar facet joint pain (Bilateral) (R>L) 11/15/2023   Chronic hip pain (2ry area of Pain) (Bilateral) (R>L) 11/15/2023   Grade 1 Lumbar Retrolisthesis of L3/L4 & L5/S1 11/15/2023   DDD (degenerative disc disease), lumbosacral (L5-S1) w/ LBP 11/15/2023   Impaired range of motion of hip (Right) 11/15/2023   Resolved Ambulatory Problems    Diagnosis Date Noted   Birth control counseling 12/13/2013   Encounter for health-related screening 12/13/2013   Test anxiety 12/13/2013   Weight gain 12/13/2013   Indication for care in labor or delivery 06/22/2015   Pregnancy 06/30/2015   Vaginal delivery 07/01/2015   Supervision of low-risk pregnancy 12/20/2016   Thrombocytopenia affecting pregnancy, antepartum (HCC) 12/22/2016   Normal labor and delivery 07/08/2017   Postpartum care following vaginal delivery 07/09/2017   Surveillance of (intrauterine) contraceptive device 09/21/2017   BMI 34.0-34.9,adult 03/05/2018   Lumbar radiculopathy 05/13/2020   Post-viral cough syndrome 08/19/2020   Chronic fatigue 01/17/2022   Acute pain of right shoulder 01/21/2022   Influenza B 08/31/2022   Past Medical History:  Diagnosis Date   Anxiety    BRCA negative 07/2021   Degenerative lumbar disc    Frequent headaches    Gestational thrombocytopenia (HCC)    Lumbar herniated disc    Migraine with aura, intractable, with status migrainosus 1995   Oligomenorrhea    Postpartum depression    Constitutional Exam  General appearance: Well nourished, well developed, and well hydrated. In no apparent acute distress Vitals:   11/15/23 1430  BP: 118/84   Pulse: 88  Resp: 16  Temp: 98.1 F (36.7 C)  TempSrc: Temporal  SpO2: 100%  Weight: 194 lb (88 kg)  Height: 5\' 8"  (1.727 m)   BMI Assessment: Estimated body mass index is 29.5 kg/m as calculated from the following:   Height as of this encounter: 5\' 8"  (1.727 m).   Weight as of this encounter: 194 lb (88 kg).  BMI interpretation table: BMI level Category Range association with higher incidence of chronic pain  <18 kg/m2 Underweight   18.5-24.9 kg/m2 Ideal body weight   25-29.9 kg/m2 Overweight Increased incidence by 20%  30-34.9  kg/m2 Obese (Class I) Increased incidence by 68%  35-39.9 kg/m2 Severe obesity (Class II) Increased incidence by 136%  >40 kg/m2 Extreme obesity (Class pain. Increased incidence by 254%   Patient's current BMI Ideal Body weight  Body mass index is 29.5 kg/m. Ideal body weight: 63.9 kg (140 lb 14 oz) Adjusted ideal body weight: 73.5 kg (162 lb 2 oz)   BMI Readings from Last 4 Encounters:  11/15/23 29.50 kg/m  11/02/23 30.41 kg/m  10/17/23 31.02 kg/m  09/20/23 29.65 kg/m   Wt Readings from Last 4 Encounters:  11/15/23 194 lb (88 kg)  11/02/23 200 lb (90.7 kg)  10/17/23 204 lb (92.5 kg)  09/20/23 195 lb (88.5 kg)    Psych/Mental status: Alert, oriented x 3 (person, place, & time)       Eyes: PERLA Respiratory: No evidence of acute respiratory distress  Physical Exam   MUSCULOSKELETAL: Pain in the left lower back and buttock on movement. Pain in the left hip and lower back on movement. Pain in the left lower back on hyperextension. Pain in the left buttock on straight leg raise. Pain in the left hip on lateral flexion.  Patrick maneuver was positive bilaterally for hip joint pain (R>L) with decreased range of motion on the right hip and referred pain towards the buttocks.  Kemp maneuver was positive bilaterally for ipsilateral facet joint pain (L>R).  The patient indicated that out of the two maneuvers the [facility] maneuver more closely imitated and  reproduced her usual pain.  The Patrick maneuver was also negative bilaterally for any significant sacroiliac joint pain.  The patient was able to toe walk and heel walk with some difficulty secondary to the low back pain but not secondary to weakness in the distribution of L5 or S1.  Today's physical exam seem to be negative for lumbosacral radiculopathy.  No evidence or complaints of lower extremity pain, numbness, or weakness.  Assessment  Primary Diagnosis & Pertinent Problem List: The primary encounter diagnosis was Acute on chronic low back pain (Bilateral) (L>R). Diagnoses of Chronic low back pain (1ry area of Pain) (Bilateral) (L>R) w/o sciatica, Chronic hip pain (2ry area of Pain) (Bilateral) (R>L), Grade 1 Lumbar Retrolisthesis of L3/L4 & L5/S1, Lumbar facet joint pain (Bilateral) (R>L), Facet hypertrophy of lumbar region, Vertebrogenic low back pain, Herniation of intervertebral disc between L5 and S1, Lumbar disc herniation, Lumbar intervertebral disc protrusion (L3-4), Mechanical low back pain, Low back pain of over 3 months duration, Multifactorial low back pain, DDD (degenerative disc disease), lumbosacral (L5-S1) w/ LBP, Abnormal MRI, lumbar spine (11/06/2023), Chronic pain syndrome, Pharmacologic therapy, Disorder of skeletal system, Problems influencing health status, Vitamin D deficiency, and Impaired range of motion of hip (Right) were also pertinent to this visit.  Visit Diagnosis (New problems to examiner): 1. Acute on chronic low back pain (Bilateral) (L>R)   2. Chronic low back pain (1ry area of Pain) (Bilateral) (L>R) w/o sciatica   3. Chronic hip pain (2ry area of Pain) (Bilateral) (R>L)   4. Grade 1 Lumbar Retrolisthesis of L3/L4 & L5/S1   5. Lumbar facet joint pain (Bilateral) (R>L)   6. Facet hypertrophy of lumbar region   7. Vertebrogenic low back pain   8. Herniation of intervertebral disc between L5 and S1   9. Lumbar disc herniation   10. Lumbar intervertebral disc  protrusion (L3-4)   11. Mechanical low back pain   12. Low back pain of over 3 months duration   13. Multifactorial low back pain  14. DDD (degenerative disc disease), lumbosacral (L5-S1) w/ LBP   15. Abnormal MRI, lumbar spine (11/06/2023)   16. Chronic pain syndrome   17. Pharmacologic therapy   18. Disorder of skeletal system   19. Problems influencing health status   20. Vitamin D deficiency   21. Impaired range of motion of hip (Right)    Plan of Care (Initial workup plan)  Note: Ms. Traynor was reminded that as per protocol, today's visit has been an evaluation only. We have not taken over the patient's controlled substance management.  Problem-specific plan: Assessment and Plan    Chronic low back pain with acute exacerbation   Chronic low back pain has persisted for at least ten years, significantly worsening after a fall on October 14, 2023. Current pain is predominantly in the lower back, more on the left side, with a severity of 7/10, exacerbated by movement, bending, and rolling over in bed. Previous treatments, including epidural injections, physical therapy, acupuncture, and steroids, have not provided significant relief. MRI shows slight retrolisthesis of L3 over L4 and L5 over S1, type one and type two mnemonic changes at L5-S1, and disc tears at L3-4 and L4-5. The pain is multifactorial, likely involving facet joints and disc tears, with no evidence of nerve compression or need for surgery. A lumbar facet block is planned based on suspected facet joint involvement, particularly on the left side, as indicated by pain during specific maneuvers. The procedure is expected to provide relief by targeting the suspected source of pain. Perform bilateral lumbar facet block with sedation. Order x-ray of hips to evaluate potential hip involvement. Order x-ray of lumbar spine with flexion and extension to assess for instability. Advise rest post-procedure and track pain response.  Hip  pain   Possible hip capsule injury on the right side is suspected from the fall. Pain in the hip area during specific maneuvers indicates potential involvement of the hip joint capsule. Order x-ray of hips to evaluate potential hip involvement.      Lab Orders         Compliance Drug Analysis, Ur         Magnesium         Sedimentation rate         25-Hydroxy vitamin D Lcms D2+D3         C-reactive protein     Imaging Orders         DG Lumbar Spine Complete W/Bend         DG HIPS BILAT W OR W/O PELVIS MIN 5 VIEWS     Referral Orders  No referral(s) requested today   Procedure Orders    No procedure(s) ordered today   Pharmacotherapy (current): Medications ordered:  Meds ordered this encounter  Medications   ketorolac (TORADOL) injection 60 mg   methocarbamol (ROBAXIN) injection 200 mg   Medications administered during this visit: We administered ketorolac and methocarbamol.   Analgesic Pharmacotherapy:  Opioid Analgesics: For patients currently taking or requesting to take opioid analgesics, in accordance with [facility] Guidelines, we will assess their risks and indications for the use of these substances. After completing our evaluation, we may offer recommendations, but we no longer take patients for medication management. The prescribing physician will ultimately decide, based on his/her training and level of comfort whether to adopt any of the recommendations, including whether or not to prescribe such medicines.  Membrane stabilizer: To be determined at a later time  Muscle relaxant: To be determined  at a later time  NSAID: To be determined at a later time  Other analgesic(s): To be determined at a later time   Interventional management options: Ms. Silman was informed that there is no guarantee that she would be a candidate for interventional therapies. The decision will be based on the results of diagnostic studies, as well as Ms. Zee's risk  profile.  Procedure(s) under consideration:  Pending results of ordered studies     Interventional Therapies  Risk Factors  Considerations  Medical Comorbidities:     Planned  Pending:   Diagnostic/therapeutic lumbar facet MBB #1     Under consideration:   Diagnostic/therapeutic lumbar facet MBB #1  Diagnostic/therapeutic right IA hip inj. #1  Diagnostic/therapeutic L3-4 LESI #1  Diagnostic/therapeutic L5-S1 LESI #1  Diagnostic/therapeutic L5 TFESI #1    Completed:   None at this time   Therapeutic  Palliative (PRN) options:   None established   Completed by other providers:   Diagnostic/therapeutic left interlaminar L5-S1 LESI x2 (05/28/2020) by Malachi Pro, MD [facility])      Provider-requested follow-up: Return for (ECT): (B) L-FCT Blk #1.  Future Appointments  Date Time Provider Department Center  11/28/2023 10:40 AM Delano Metz, MD [facility] None  01/30/2024 12:45 PM Ihor Austin, NP [facility] None  02/19/2024  1:30 PM Anson Fret, MD [facility] None    Duration of encounter: 70 minutes.  Total time on encounter, as per AMA guidelines included both the face-to-face and non-face-to-face time personally spent by the physician and/or other qualified health care professional(s) on the day of the encounter (includes time in activities that require the physician or other qualified health care professional and does not include time in activities normally performed by clinical staff). Physician's time may include the following activities when performed: Preparing to see the patient (e.g., pre-charting review of records, searching for previously ordered imaging, lab work, and nerve conduction tests) Review of prior analgesic pharmacotherapies. Reviewing PMP Interpreting ordered tests (e.g., lab work, imaging, nerve conduction tests) Performing post-procedure evaluations, including interpretation of diagnostic procedures Obtaining and/or reviewing  separately obtained history Performing a medically appropriate examination and/or evaluation Counseling and educating the patient/family/caregiver Ordering medications, tests, or procedures Referring and communicating with other health care professionals (when not separately reported) Documenting clinical information in the electronic or other health record Independently interpreting results (not separately reported) and communicating results to the patient/ family/caregiver Care coordination (not separately reported)  Note by: Oswaldo Done, MD (AI and TTS technology used. I apologize for any typographical errors that were not detected and corrected.) Date: 11/15/2023; Time: 4:25 PM

## 2023-11-15 NOTE — Patient Instructions (Addendum)
 ______________________________________________________________________    New Patients  Welcome to [place] [facility].   Initial Visit The first or initial visit consists of an evaluation only.   Interventional pain management.  We offer therapies other than opioid controlled substances to manage chronic pain. These include, but are not limited to, diagnostic, therapeutic, and palliative specialized injection therapies (i.e.: Epidural Steroids, Facet Blocks, etc.). We specialize in a variety of nerve blocks as well as radiofrequency treatments. We offer pain implant evaluations and trials, as well as follow up management. In addition we also provide a variety joint injections, including Viscosupplementation (AKA: Gel Therapy).  Prescription Pain Medication. We specialize in alternatives to opioids. We can provide evaluations and recommendations for/of pharmacologic therapies based on CDC Guidelines.  We no longer take patients for long-term medication management. We will not be taking over your pain medications.  ______________________________________________________________________      ______________________________________________________________________    Patient Information update  To: All of our patients.  Re: Name change.  It has been made official that our current name, "[facility]"   will soon be changed to " [facility]".   The purpose of this change is to eliminate any confusion created by the concept of our practice being a "Medication Management Pain Clinic". In the past this has led to the misconception that we treat pain primarily by the use of prescription medications.  Nothing can be farther from the truth.   Understanding PAIN MANAGEMENT: To further understand what our practice does, you first have to  understand that "Pain Management" is a subspecialty that requires additional training once a physician has completed their specialty training, which can be in either Anesthesia, Neurology, Psychiatry, or Physical Medicine and Rehabilitation (PMR). Each one of these contributes to the final approach taken by each physician to the management of their patient's pain. To be a "Pain Management Specialist" you must have first completed one of the specialty trainings below.  Anesthesiologists - trained in clinical pharmacology and interventional techniques such as nerve blockade and regional as well as central neuroanatomy. They are trained to block pain before, during, and after surgical interventions.  Neurologists - trained in the diagnosis and pharmacological treatment of complex neurological conditions, such as Multiple Sclerosis, Parkinson's, spinal cord injuries, and other systemic conditions that may be associated with symptoms that may include but are not limited to pain. They tend to rely primarily on the treatment of chronic pain using prescription medications.  Psychiatrist - trained in conditions affecting the psychosocial wellbeing of patients including but not limited to depression, anxiety, schizophrenia, personality disorders, addiction, and other substance use disorders that may be associated with chronic pain. They tend to rely primarily on the treatment of chronic pain using prescription medications.   Physical Medicine and Rehabilitation (PMR) physicians, also known as physiatrists - trained to treat a wide variety of medical conditions affecting the brain, spinal cord, nerves, bones, joints, ligaments, muscles, and tendons. Their training is primarily aimed at treating patients that have suffered injuries that have caused severe physical impairment. Their training is primarily aimed at the physical therapy and rehabilitation of those patients. They may also work alongside orthopedic surgeons  or neurosurgeons using their expertise in assisting surgical patients to recover after their surgeries.  INTERVENTIONAL PAIN MANAGEMENT is sub-subspecialty of Pain Management.  Our physicians are Board-certified in Anesthesia, Pain Management, and Interventional Pain Management.  This meaning that  not only have they been trained and Board-certified in their specialty of Anesthesia, and subspecialty of Pain Management, but they have also received further training in the sub-subspecialty of Interventional Pain Management, in order to become Board-certified as INTERVENTIONAL PAIN MANAGEMENT SPECIALIST.    Mission: Our goal is to use our skills in  INTERVENTIONAL PAIN MANAGEMENT as alternatives to the chronic use of prescription opioid medications for the treatment of pain. To make this more clear, we have changed our name to reflect what we do and offer. We will continue to offer medication management assessment and recommendations, but we will not be taking over any patient's medication management.  ______________________________________________________________________      ______________________________________________________________________    Procedure instructions  Stop blood-thinners  Do not eat or drink fluids (other than water) for 6 hours before your procedure  No water for 2 hours before your procedure  Take your blood pressure medicine with a sip of water  Arrive 30 minutes before your appointment  If sedation is planned, bring suitable driver. Pennie Banter, Benedetto Goad, & public transportation are NOT APPROVED)  Carefully read the "Preparing for your procedure" detailed instructions  If you have questions call us at [phone]  Procedure appointments are for procedures only. NO medication refills or new problem evaluations.   ______________________________________________________________________      ______________________________________________________________________     Preparing for your procedure  Appointments: If you think you may not be able to keep your appointment, call 24-48 hours in advance to cancel. We need time to make it available to others.  Procedure visits are for procedures only. During your procedure appointment there will be: NO Prescription Refills*. NO medication changes or discussions*. NO discussion of disability issues*. NO unrelated pain problem evaluations*. NO evaluations to order other pain procedures*. *These will be addressed at a separate and distinct evaluation encounter on the provider's evaluation schedule and not during procedure days.  Instructions: Food intake: Avoid eating anything solid for at least 8 hours prior to your procedure. Clear liquid intake: You may take clear liquids such as water up to 2 hours prior to your procedure. (No carbonated drinks. No soda.) Transportation: Unless otherwise stated by your physician, bring a driver. (Driver cannot be a Market researcher, Pharmacist, community, or any other form of public transportation.) Morning Medicines: Except for blood thinners, take all of your other morning medications with a sip of water. Make sure to take your heart and blood pressure medicines. If your blood pressure's lower number is above 100, the case will be rescheduled. Blood thinners: Make sure to stop your blood thinners as instructed.  If you take a blood thinner, but were not instructed to stop it, call our office [phone] and ask to talk to a nurse. Not stopping a blood thinner prior to certain procedures could lead to serious complications. Diabetics on insulin: Notify the staff so that you can be scheduled 1st case in the morning. If your diabetes requires high dose insulin, take only  of your normal insulin dose the morning of the procedure and notify the staff that you have done so. Preventing infections: Shower with an antibacterial soap the morning of your procedure.  Build-up your immune system: Take 1000 mg of  Vitamin C with every meal (3 times a day) the day prior to your procedure. Antibiotics: Inform the nursing staff if you are taking any antibiotics or if you have any conditions that may require antibiotics prior to procedures. (Example: recent joint implants)   Pregnancy: If you  are pregnant make sure to notify the nursing staff. Not doing so may result in injury to the fetus, including death.  Sickness: If you have a cold, fever, or any active infections, call and cancel or reschedule your procedure. Receiving steroids while having an infection may result in complications. Arrival: You must be in the facility at least 30 minutes prior to your scheduled procedure. Tardiness: Your scheduled time is also the cutoff time. If you do not arrive at least 15 minutes prior to your procedure, you will be rescheduled.  Children: Do not bring any children with you. Make arrangements to keep them home. Dress appropriately: There is always a possibility that your clothing may get soiled. Avoid long dresses. Valuables: Do not bring any jewelry or valuables.  Reasons to call and reschedule or cancel your procedure: (Following these recommendations will minimize the risk of a serious complication.) Surgeries: Avoid having procedures within 2 weeks of any surgery. (Avoid for 2 weeks before or after any surgery). Flu Shots: Avoid having procedures within 2 weeks of a flu shots or . (Avoid for 2 weeks before or after immunizations). Barium: Avoid having a procedure within 7-10 days after having had a radiological study involving the use of radiological contrast. (Myelograms, Barium swallow or enema study). Heart attacks: Avoid any elective procedures or surgeries for the initial 6 months after a "Myocardial Infarction" (Heart Attack). Blood thinners: It is imperative that you stop these medications before procedures. Let us know if you if you take any blood thinner.  Infection: Avoid procedures during or within two  weeks of an infection (including chest colds or gastrointestinal problems). Symptoms associated with infections include: Localized redness, fever, chills, night sweats or profuse sweating, burning sensation when voiding, cough, congestion, stuffiness, runny nose, sore throat, diarrhea, nausea, vomiting, cold or Flu symptoms, recent or current infections. It is specially important if the infection is over the area that we intend to treat. Heart and lung problems: Symptoms that may suggest an active cardiopulmonary problem include: cough, chest pain, breathing difficulties or shortness of breath, dizziness, ankle swelling, uncontrolled high or unusually low blood pressure, and/or palpitations. If you are experiencing any of these symptoms, cancel your procedure and contact your primary care physician for an evaluation.  Remember:  Regular Business hours are:  Monday to Thursday 8:00 AM to 4:00 PM  Provider's Schedule: Delano Metz, MD:  Procedure days: Tuesday and Thursday 7:30 AM to 4:00 PM  Edward Jolly, MD:  Procedure days: Monday and Wednesday 7:30 AM to 4:00 PM Last  Updated: 08/15/2023 ______________________________________________________________________      ______________________________________________________________________    General Risks and Possible Complications  Patient Responsibilities: It is important that you read this as it is part of your informed consent. It is our duty to inform you of the risks and possible complications associated with treatments offered to you. It is your responsibility as a patient to read this and to ask questions about anything that is not clear or that you believe was not covered in this document.  Patient's Rights: You have the right to refuse treatment. You also have the right to change your mind, even after initially having agreed to have the treatment done. However, under this last option, if you wait until the last second to change your  mind, you may be charged for the materials used up to that point.  Introduction: Medicine is not an Visual merchandiser. Everything in Medicine, including the lack of treatment(s), carries the potential for danger, harm,  or loss (which is by definition: Risk). In Medicine, a complication is a secondary problem, condition, or disease that can aggravate an already existing one. All treatments carry the risk of possible complications. The fact that a side effects or complications occurs, does not imply that the treatment was conducted incorrectly. It must be clearly understood that these can happen even when everything is done following the highest safety standards.  No treatment: You can choose not to proceed with the proposed treatment alternative. The "PRO(s)" would include: avoiding the risk of complications associated with the therapy. The "CON(s)" would include: not getting any of the treatment benefits. These benefits fall under one of three categories: diagnostic; therapeutic; and/or palliative. Diagnostic benefits include: getting information which can ultimately lead to improvement of the disease or symptom(s). Therapeutic benefits are those associated with the successful treatment of the disease. Finally, palliative benefits are those related to the decrease of the primary symptoms, without necessarily curing the condition (example: decreasing the pain from a flare-up of a chronic condition, such as incurable terminal cancer).  General Risks and Complications: These are associated to most interventional treatments. They can occur alone, or in combination. They fall under one of the following six (6) categories: no benefit or worsening of symptoms; bleeding; infection; nerve damage; allergic reactions; and/or death. No benefits or worsening of symptoms: In Medicine there are no guarantees, only probabilities. No healthcare provider can ever guarantee that a medical treatment will work, they can only state  the probability that it may. Furthermore, there is always the possibility that the condition may worsen, either directly, or indirectly, as a consequence of the treatment. Bleeding: This is more common if the patient is taking a blood thinner, either prescription or over the counter (example: Goody Powders, Fish oil, Aspirin, Garlic, etc.), or if suffering a condition associated with impaired coagulation (example: Hemophilia, cirrhosis of the liver, low platelet counts, etc.). However, even if you do not have one on these, it can still happen. If you have any of these conditions, or take one of these drugs, make sure to notify your treating physician. Infection: This is more common in patients with a compromised immune system, either due to disease (example: diabetes, cancer, human immunodeficiency virus [HIV], etc.), or due to medications or treatments (example: therapies used to treat cancer and rheumatological diseases). However, even if you do not have one on these, it can still happen. If you have any of these conditions, or take one of these drugs, make sure to notify your treating physician. Nerve Damage: This is more common when the treatment is an invasive one, but it can also happen with the use of medications, such as those used in the treatment of cancer. The damage can occur to small secondary nerves, or to large primary ones, such as those in the spinal cord and brain. This damage may be temporary or permanent and it may lead to impairments that can range from temporary numbness to permanent paralysis and/or brain death. Allergic Reactions: Any time a substance or material comes in contact with our body, there is the possibility of an allergic reaction. These can range from a mild skin rash (contact dermatitis) to a severe systemic reaction (anaphylactic reaction), which can result in death. Death: In general, any medical intervention can result in death, most of the time due to an unforeseen  complication. ______________________________________________________________________     ____________________________________________________________________________________________  Spondylolisthesis  Spondylolisthesis is a condition that occurs when a vertebra in the spine slips  out of place, usually in the lower back. Symptoms can vary from mild to severe, and a person may have no symptoms.  Some common symptoms include:  Back pain, especially chronic pain  Pain that radiates down the legs  Pain that worsens with exercise  Tightness in the hamstrings  Neck stiffness  Loss of spine flexibility  Weakness in the legs or trouble walking  Numbness and tingling in the groin and/or buttocks   Some causes of spondylolisthesis include: Birth defects. Sudden injury. Abnormal wear on the cartilage and bones, such as arthritis. Bone disease and fractures. Certain sports activities, such as gymnastics, weightlifting, and football.  A doctor can diagnose spondylolisthesis with a physical exam, X-rays, and possibly a CT scan.    Forward slippage is known as "Anterolisthesis".  Backward slippage is known as "Retrolisthesis".   Pathophysiology of Spondylolisthesis:   Grading Classification of Spondylolisthesis Grade I spondylolisthesis is 1 to 25% slippage, grade II is up to 50% slippage, grade III is up to 75% slippage, and grade IV is 76-100% slippage. If there is more than 100% slippage, it is known as spondyloptosis or grade V spondylolisthesis.       ____________________________________________________________________________________________

## 2023-11-19 LAB — COMPLIANCE DRUG ANALYSIS, UR

## 2023-11-25 LAB — C-REACTIVE PROTEIN: CRP: 1 mg/L (ref 0–10)

## 2023-11-25 LAB — MAGNESIUM: Magnesium: 2 mg/dL (ref 1.6–2.3)

## 2023-11-25 LAB — 25-HYDROXY VITAMIN D LCMS D2+D3
25-Hydroxy, Vitamin D-2: <1 ng/mL
25-Hydroxy, Vitamin D-3: 28 ng/mL
25-Hydroxy, Vitamin D: 28 ng/mL — ABNORMAL LOW

## 2023-11-25 LAB — SEDIMENTATION RATE: Sed Rate: 10 mm/h (ref 0–32)

## 2023-11-28 ENCOUNTER — Ambulatory Visit
Admission: RE | Admit: 2023-11-28 | Discharge: 2023-11-28 | Disposition: A | Discharge status: Home / Self Care | Source: Ambulatory Visit | Attending: Pain Medicine | Admitting: Pain Medicine

## 2023-11-28 ENCOUNTER — Ambulatory Visit: Attending: Pain Medicine | Admitting: Pain Medicine

## 2023-11-28 VITALS — BP 108/78 | HR 71 | Temp 98.2°F | Resp 16 | Ht 68.0 in | Wt 195.0 lb

## 2023-11-28 DIAGNOSIS — M47816 Spondylosis without myelopathy or radiculopathy, lumbar region: Secondary | ICD-10-CM | POA: Diagnosis present

## 2023-11-28 DIAGNOSIS — G8929 Other chronic pain: Secondary | ICD-10-CM

## 2023-11-28 DIAGNOSIS — Z5189 Encounter for other specified aftercare: Secondary | ICD-10-CM | POA: Diagnosis present

## 2023-11-28 DIAGNOSIS — M5459 Other low back pain: Secondary | ICD-10-CM

## 2023-11-28 DIAGNOSIS — M545 Low back pain, unspecified: Secondary | ICD-10-CM

## 2023-11-28 DIAGNOSIS — M47817 Spondylosis without myelopathy or radiculopathy, lumbosacral region: Secondary | ICD-10-CM

## 2023-11-28 DIAGNOSIS — M4310 Spondylolisthesis, site unspecified: Secondary | ICD-10-CM

## 2023-11-28 MED ORDER — TRIAMCINOLONE ACETONIDE 40 MG/ML IJ SUSP
INTRAMUSCULAR | Status: AC
  Filled 2023-11-28: qty 1

## 2023-11-28 MED ORDER — FENTANYL CITRATE (PF) 100 MCG/2ML IJ SOLN
INTRAMUSCULAR | Status: AC
  Filled 2023-11-28: qty 2

## 2023-11-28 MED ORDER — PENTAFLUOROPROP-TETRAFLUOROETH EX AERO
INHALATION_SPRAY | Freq: Once | CUTANEOUS | Status: AC
Start: 2023-11-28 — End: 2023-11-28
  Administered 2023-11-28: 30 via TOPICAL

## 2023-11-28 MED ORDER — LIDOCAINE HCL 2 % IJ SOLN
INTRAMUSCULAR | Status: AC
  Filled 2023-11-28: qty 20

## 2023-11-28 MED ORDER — ROPIVACAINE HCL 2 MG/ML IJ SOLN
18.0000 mL | Freq: Once | INTRAMUSCULAR | Status: AC
  Administered 2023-11-28: 18 mL via PERINEURAL

## 2023-11-28 MED ORDER — ROPIVACAINE HCL 2 MG/ML IJ SOLN
INTRAMUSCULAR | Status: AC
  Filled 2023-11-28: qty 20

## 2023-11-28 MED ORDER — MIDAZOLAM HCL 5 MG/5ML IJ SOLN
0.5000 mg | Freq: Once | INTRAMUSCULAR | Status: AC
Start: 2023-11-28 — End: 2023-11-28
  Administered 2023-11-28: 3 mg via INTRAVENOUS

## 2023-11-28 MED ORDER — FENTANYL CITRATE (PF) 100 MCG/2ML IJ SOLN
25.0000 ug | INTRAMUSCULAR | Status: DC | PRN
Start: 2023-11-28 — End: 2023-11-28
  Administered 2023-11-28: 50 ug via INTRAVENOUS

## 2023-11-28 MED ORDER — LIDOCAINE HCL 2 % IJ SOLN
20.0000 mL | Freq: Once | INTRAMUSCULAR | Status: AC
  Administered 2023-11-28: 400 mg

## 2023-11-28 MED ORDER — MIDAZOLAM HCL 5 MG/5ML IJ SOLN
INTRAMUSCULAR | Status: AC
  Filled 2023-11-28: qty 5

## 2023-11-28 MED ORDER — TRIAMCINOLONE ACETONIDE 40 MG/ML IJ SUSP
80.0000 mg | Freq: Once | INTRAMUSCULAR | Status: AC
  Administered 2023-11-28: 80 mg

## 2023-11-28 NOTE — Progress Notes (Signed)
 PROVIDER NOTE: Interpretation of information contained herein should be left to medically-trained personnel. Specific patient instructions are provided elsewhere under "Patient Instructions" section of medical record. This document was created in part using STT-dictation technology, any transcriptional errors that may result from this process are unintentional.  Patient: Jennifer Morse Type: Established DOB: 1990/10/28 MRN: [id number] PCP: Marjie Skiff, NP  Service: Procedure DOS: 11/28/2023 Setting: Ambulatory Location: Ambulatory outpatient facility Delivery: Face-to-face Provider: Oswaldo Done, MD Specialty: Interventional Pain Management Specialty designation: 09 Location: Outpatient facility Ref. Prov.: Marjie Skiff, NP       Interventional Therapy   Type: Lumbar Facet, Medial Branch Block(s) (w/ fluoroscopic mapping) #1  Laterality: Bilateral  Level: L2, L3, L4, L5, and S1 Medial Branch Level(s). Injecting these levels blocks the L3-4, L4-5, and L5-S1 lumbar facet joints. Imaging: Fluoroscopic guidance Spinal ([id number]) Anesthesia: Local anesthesia (1-2% Lidocaine) Anxiolysis: IV Versed         Sedation: Moderate Sedation                       DOS: 11/28/2023 Performed by: Oswaldo Done, MD  Primary Purpose: Diagnostic/Therapeutic Indications: Low back pain severe enough to impact quality of life or function. 1. Chronic low back pain (1ry area of Pain) (Bilateral) (L>R) w/o sciatica   2. Grade 1 Lumbar Retrolisthesis of L3/L4 & L5/S1   3. Low back pain of over 3 months duration   4. Lumbar facet hypertrophy   5. Lumbar facet joint pain (Bilateral) (R>L)   6. Spondylosis without myelopathy or radiculopathy, lumbosacral region   7. Lumbar facet joint syndrome   8. Osteoarthritis of lumbar spine   9. Osteoarthritis of facet joint of lumbar spine   10. Mechanical low back pain    NAS-11 Pain score:   Pre-procedure: 7 /10   Post-procedure: 5 /10      Position / Prep / Materials:  Position: Prone  Prep solution: ChloraPrep (2% chlorhexidine gluconate and 70% isopropyl alcohol) Area Prepped: Posterolateral Lumbosacral Spine (Wide prep: From the lower border of the scapula down to the end of the tailbone and from flank to flank.)  Materials:  Tray: Block Needle(s):  Type: Spinal  Gauge (G): 22  Length: 3.5-in Qty: 4     H&P (Pre-op Assessment):  [name] is a 33 y.o. (year old), female patient, seen today for interventional treatment. She  has a past surgical history that includes Wisdom tooth extraction (2012). [name] has a current medication list which includes the following prescription(s): aspirin-acetaminophen-caffeine, botulinum toxin type a, budesonide-formoterol, bupropion, buspirone, ajovy, gabapentin, hydrocortisone, levonorgestrel, lubiprostone, nurtec, sumatriptan, [DISCONTINUED] azelastine, and [DISCONTINUED] phentermine, and the following Facility-Administered Medications: fentanyl and fremanezumab-vfrm. Her primarily concern today is the Back Pain (lower)  Initial Vital Signs:  Pulse/HCG Rate: 71ECG Heart Rate: 67 (nsr) Temp: 98.2 F (36.8 C) Resp: 16 BP: 123/82 SpO2: 100 %  BMI: Estimated body mass index is 29.65 kg/m as calculated from the following:   Height as of this encounter: 5\' 8"  (1.727 m).   Weight as of this encounter: 195 lb (88.5 kg).  Risk Assessment: Allergies: Reviewed. She is allergic to propranolol.  Allergy Precautions: None required Coagulopathies: Reviewed. None identified.  Blood-thinner therapy: None at this time Active Infection(s): Reviewed. None identified. [name] is afebrile  Site Confirmation: [name] was asked to confirm the procedure and laterality before marking the site Procedure checklist: Completed Consent: Before the procedure and under the influence of no sedative(s), amnesic(s),  or anxiolytics, the patient was informed of the treatment options, risks  and possible complications. To fulfill our ethical and legal obligations, as recommended by the American Medical Association's Code of Ethics, I have informed the patient of my clinical impression; the nature and purpose of the treatment or procedure; the risks, benefits, and possible complications of the intervention; the alternatives, including doing nothing; the risk(s) and benefit(s) of the alternative treatment(s) or procedure(s); and the risk(s) and benefit(s) of doing nothing. The patient was provided information about the general risks and possible complications associated with the procedure. These may include, but are not limited to: failure to achieve desired goals, infection, bleeding, organ or nerve damage, allergic reactions, paralysis, and death. In addition, the patient was informed of those risks and complications associated to Spine-related procedures, such as failure to decrease pain; infection (i.e.: Meningitis, epidural or intraspinal abscess); bleeding (i.e.: epidural hematoma, subarachnoid hemorrhage, or any other type of intraspinal or peri-dural bleeding); organ or nerve damage (i.e.: Any type of peripheral nerve, nerve root, or spinal cord injury) with subsequent damage to sensory, motor, and/or autonomic systems, resulting in permanent pain, numbness, and/or weakness of one or several areas of the body; allergic reactions; (i.e.: anaphylactic reaction); and/or death. Furthermore, the patient was informed of those risks and complications associated with the medications. These include, but are not limited to: allergic reactions (i.e.: anaphylactic or anaphylactoid reaction(s)); adrenal axis suppression; blood sugar elevation that in diabetics may result in ketoacidosis or comma; water retention that in patients with history of congestive heart failure may result in shortness of breath, pulmonary edema, and decompensation with resultant heart failure; weight gain; swelling or edema;  medication-induced neural toxicity; particulate matter embolism and blood vessel occlusion with resultant organ, and/or nervous system infarction; and/or aseptic necrosis of one or more joints. Finally, the patient was informed that Medicine is not an exact science; therefore, there is also the possibility of unforeseen or unpredictable risks and/or possible complications that may result in a catastrophic outcome. The patient indicated having understood very clearly. We have given the patient no guarantees and we have made no promises. Enough time was given to the patient to ask questions, all of which were answered to the patient's satisfaction. Ms. Affinito has indicated that she wanted to continue with the procedure. Attestation: I, the ordering provider, attest that I have discussed with the patient the benefits, risks, side-effects, alternatives, likelihood of achieving goals, and potential problems during recovery for the procedure that I have provided informed consent. Date  Time: 11/28/2023 10:21 AM   Pre-Procedure Preparation:  Monitoring: As per clinic protocol. Respiration, ETCO2, SpO2, BP, heart rate and rhythm monitor placed and checked for adequate function Safety Precautions: Patient was assessed for positional comfort and pressure points before starting the procedure. Time-out: I initiated and conducted the "Time-out" before starting the procedure, as per protocol. The patient was asked to participate by confirming the accuracy of the "Time Out" information. Verification of the correct person, site, and procedure were performed and confirmed by me, the nursing staff, and the patient. "Time-out" conducted as per Joint Commission's Universal Protocol (UP.01.01.01). Time: 1239 Start Time: 1239 hrs.  Description of Procedure:          Laterality: (see above) Targeted Levels: (see above)  Safety Precautions: Aspiration looking for blood return was conducted prior to all injections. At no  point did we inject any substances, as a needle was being advanced. Before injecting, the patient was told to immediately notify me  if she was experiencing any new onset of "ringing in the ears, or metallic taste in the mouth". No attempts were made at seeking any paresthesias. Safe injection practices and needle disposal techniques used. Medications properly checked for expiration dates. SDV (single dose vial) medications used. After the completion of the procedure, all disposable equipment used was discarded in the proper designated medical waste containers. Local Anesthesia: Protocol guidelines were followed. The patient was positioned over the fluoroscopy table. The area was prepped in the usual manner. The time-out was completed. The target area was identified using fluoroscopy. A 12-in long, straight, sterile hemostat was used with fluoroscopic guidance to locate the targets for each level blocked. Once located, the skin was marked with an approved surgical skin marker. Once all sites were marked, the skin (epidermis, dermis, and hypodermis), as well as deeper tissues (fat, connective tissue and muscle) were infiltrated with a small amount of a short-acting local anesthetic, loaded on a 10cc syringe with a 25G, 1.5-in  Needle. An appropriate amount of time was allowed for local anesthetics to take effect before proceeding to the next step. Local Anesthetic: Lidocaine 2.0% The unused portion of the local anesthetic was discarded in the proper designated containers. Technical description of process:  L2 Medial Branch Nerve Block (MBB): The target area for the L2 medial branch is at the junction of the postero-lateral aspect of the superior articular process and the superior, posterior, and medial edge of the transverse process of L3. Under fluoroscopic guidance, a Quincke needle was inserted until contact was made with os over the superior postero-lateral aspect of the pedicular shadow (target area). After  negative aspiration for blood, 0.5 mL of the nerve block solution was injected without difficulty or complication. The needle was removed intact. L3 Medial Branch Nerve Block (MBB): The target area for the L3 medial branch is at the junction of the postero-lateral aspect of the superior articular process and the superior, posterior, and medial edge of the transverse process of L4. Under fluoroscopic guidance, a Quincke needle was inserted until contact was made with os over the superior postero-lateral aspect of the pedicular shadow (target area). After negative aspiration for blood, 0.5 mL of the nerve block solution was injected without difficulty or complication. The needle was removed intact. L4 Medial Branch Nerve Block (MBB): The target area for the L4 medial branch is at the junction of the postero-lateral aspect of the superior articular process and the superior, posterior, and medial edge of the transverse process of L5. Under fluoroscopic guidance, a Quincke needle was inserted until contact was made with os over the superior postero-lateral aspect of the pedicular shadow (target area). After negative aspiration for blood, 0.5 mL of the nerve block solution was injected without difficulty or complication. The needle was removed intact. L5 Medial Branch Nerve Block (MBB): The target area for the L5 medial branch is at the junction of the postero-lateral aspect of the superior articular process and the superior, posterior, and medial edge of the sacral ala. Under fluoroscopic guidance, a Quincke needle was inserted until contact was made with os over the superior postero-lateral aspect of the pedicular shadow (target area). After negative aspiration for blood, 0.5 mL of the nerve block solution was injected without difficulty or complication. The needle was removed intact. S1 Medial Branch Nerve Block (MBB): The target area for the S1 medial branch is at the posterior and inferior 6 o'clock position of  the L5-S1 facet joint. Under fluoroscopic guidance, the Quincke needle  inserted for the L5 MBB was redirected until contact was made with os over the inferior and postero aspect of the sacrum, at the 6 o' clock position under the L5-S1 facet joint (Target area). After negative aspiration for blood, 0.5 mL of the nerve block solution was injected without difficulty or complication. The needle was removed intact.  Once the entire procedure was completed, the treated area was cleaned, making sure to leave some of the prepping solution back to take advantage of its long term bactericidal properties.         Illustration of the posterior view of the lumbar spine and the posterior neural structures. Laminae of L2 through S1 are labeled. DPRL5, dorsal primary ramus of L5; DPRS1, dorsal primary ramus of S1; DPR3, dorsal primary ramus of L3; FJ, facet (zygapophyseal) joint L3-L4; I, inferior articular process of L4; LB1, lateral branch of dorsal primary ramus of L1; IAB, inferior articular branches from L3 medial branch (supplies L4-L5 facet joint); IBP, intermediate branch plexus; MB3, medial branch of dorsal primary ramus of L3; NR3, third lumbar nerve root; S, superior articular process of L5; SAB, superior articular branches from L4 (supplies L4-5 facet joint also); TP3, transverse process of L3.   Facet Joint Innervation (* possible contribution)  L1-2 T12, L1 (L2*)  Medial Branch  L2-3 L1, L2 (L3*)         "          "  L3-4 L2, L3 (L4*)         "          "  L4-5 L3, L4 (L5*)         "          "  L5-S1 L4, L5, S1          "          "    Vitals:   11/28/23 1258 11/28/23 1300 11/28/23 1310 11/28/23 1320  BP: 98/66 105/78 107/79 108/78  Pulse:      Resp:    16  Temp:      SpO2: 100% 100% 100% 100%  Weight:      Height:         End Time: 1250 hrs.  Imaging Guidance (Spinal):          Type of Imaging Technique: Fluoroscopy Guidance (Spinal) Indication(s): Fluoroscopy guidance for needle  placement to enhance accuracy in procedures requiring precise needle localization for targeted delivery of medication in or near specific anatomical locations not easily accessible without such real-time imaging assistance. Exposure Time: Please see nurses notes. Contrast: None used. Fluoroscopic Guidance: I was personally present during the use of fluoroscopy. "Tunnel Vision Technique" used to obtain the best possible view of the target area. Parallax error corrected before commencing the procedure. "Direction-depth-direction" technique used to introduce the needle under continuous pulsed fluoroscopy. Once target was reached, antero-posterior, oblique, and lateral fluoroscopic projection used confirm needle placement in all planes. Images permanently stored in EMR. Interpretation: No contrast injected. I personally interpreted the imaging intraoperatively. Adequate needle placement confirmed in multiple planes. Permanent images saved into the patient's record.  Post-operative Assessment:  Post-procedure Vital Signs:  Pulse/HCG Rate: 7171 Temp: 98.2 F (36.8 C) Resp: 16 BP: 108/78 SpO2: 100 %  EBL: None  Complications: No immediate post-treatment complications observed by team, or reported by patient.  Note: The patient tolerated the entire procedure well. A repeat set of vitals were taken after the procedure and the patient was  kept under observation following institutional policy, for this type of procedure. Post-procedural neurological assessment was performed, showing return to baseline, prior to discharge. The patient was provided with post-procedure discharge instructions, including a section on how to identify potential problems. Should any problems arise concerning this procedure, the patient was given instructions to immediately contact us, at any time, without hesitation. In any case, we plan to contact the patient by telephone for a follow-up status report regarding this interventional  procedure.  Comments:  No additional relevant information.  Plan of Care (POC)  Orders:  Orders Placed This Encounter  Procedures   LUMBAR FACET(MEDIAL BRANCH NERVE BLOCK) MBNB    Scheduling Instructions:     Procedure: Lumbar facet block (AKA.: Lumbosacral medial branch nerve block)     Side: Bilateral     Level: L3-4, L4-5, L5-S1, and TBD Facets (L2, L3, L4, L5, S1, and TBD Medial Branch Nerves)     Sedation: Patient's choice.     Timeframe: Today    Where will this procedure be performed?:   [facility]   DG [facility] C-ARM 1-60 MIN NO REPORT    Intraoperative interpretation by procedural physician at [facility].    Standing Status:   Standing    Number of Occurrences:   1    Reason for exam::   Assistance in needle guidance and placement for procedures requiring needle placement in or near specific anatomical locations not easily accessible without such assistance.   Informed Consent Details: Physician/Practitioner Attestation; Transcribe to consent form and obtain patient signature    Nursing Order: Transcribe to consent form and obtain patient signature. Note: Always confirm laterality of pain with Ms. Gean Quint, before procedure.    Physician/Practitioner attestation of informed consent for procedure/surgical case:   I, the physician/practitioner, attest that I have discussed with the patient the benefits, risks, side effects, alternatives, likelihood of achieving goals and potential problems during recovery for the procedure that I have provided informed consent.    Procedure:   Lumbar Facet Block  under fluoroscopic guidance    Physician/Practitioner performing the procedure:   Jarome Trull A. Laban Emperor MD    Indication/Reason:   Low Back Pain, with our without leg pain, due to Facet Joint Arthralgia (Joint Pain) Spondylosis (Arthritis of the Spine), without myelopathy or radiculopathy (Nerve Damage).   Provide equipment / supplies at bedside    Procedure tray:  "Block Tray" (Disposable  single use) Skin infiltration needle: Regular 1.5-in, 25-G, (x1) Block Needle type: Spinal Amount/quantity: 4 Size: Regular (3.5-inch) Gauge: 22G    Standing Status:   Standing    Number of Occurrences:   1    Specify:   Block Tray   Saline lock IV    Have LR [phone] mL available and administer at 125 mL/hr if patient becomes hypotensive.    Standing Status:   Standing    Number of Occurrences:   1   Chronic Opioid Analgesic:   hydrocodone/APAP 10/325, 1 tab p.o. 4 times daily  MME/day: 40 mg/day   Medications ordered for procedure: Meds ordered this encounter  Medications   lidocaine (XYLOCAINE) 2 % (with pres) injection 400 mg   pentafluoroprop-tetrafluoroeth (GEBAUERS) aerosol   midazolam (VERSED) 5 MG/5ML injection 0.5-2 mg    Make sure Flumazenil is available in the pyxis when using this medication. If oversedation occurs, administer 0.2 mg IV over 15 sec. If after 45 sec no response, administer 0.2 mg again over 1 min; may repeat at 1 min  intervals; not to exceed 4 doses (1 mg)   fentaNYL (SUBLIMAZE) injection 25-50 mcg    Make sure Narcan is available in the pyxis when using this medication. In the event of respiratory depression (RR< 8/min): Titrate NARCAN (naloxone) in increments of 0.1 to 0.2 mg IV at 2-3 minute intervals, until desired degree of reversal.   ropivacaine (PF) 2 mg/mL (0.2%) (NAROPIN) injection 18 mL   triamcinolone acetonide (KENALOG-40) injection 80 mg   Medications administered: We administered lidocaine, pentafluoroprop-tetrafluoroeth, midazolam, fentaNYL, ropivacaine (PF) 2 mg/mL (0.2%), and triamcinolone acetonide.  See the medical record for exact dosing, route, and time of administration.  Follow-up plan:   Return in about 2 weeks (around 12/12/2023) for (Face2F), (PPE).       Interventional Therapies  Risk Factors  Considerations  Medical Comorbidities:     Planned  Pending:   Diagnostic/therapeutic lumbar  facet MBB #1     Under consideration:   Diagnostic/therapeutic lumbar facet MBB #1  Diagnostic/therapeutic right IA hip inj. #1  Diagnostic/therapeutic L3-4 LESI #1  Diagnostic/therapeutic L5-S1 LESI #1  Diagnostic/therapeutic L5 TFESI #1    Completed:   None at this time   Therapeutic  Palliative (PRN) options:   None established   Completed by other providers:   Diagnostic/therapeutic left interlaminar L5-S1 LESI x2 (05/28/2020) by Malachi Pro, MD [facility])       Recent Visits Date Type Provider Dept  11/15/23 Office Visit Delano Metz, MD [facility]  Showing recent visits within past 90 days and meeting all other requirements Today's Visits Date Type Provider Dept  11/28/23 Procedure visit Delano Metz, MD [facility]  Showing today's visits and meeting all other requirements Future Appointments Date Type Provider Dept  12/14/23 Appointment Delano Metz, MD [facility]  Showing future appointments within next 90 days and meeting all other requirements  Disposition: Discharge home  Discharge (Date  Time): 11/28/2023; 1325 hrs.   Primary Care Physician: Marjie Skiff, NP Location: [facility] Note by: Oswaldo Done, MD (TTS technology used. I apologize for any typographical errors that were not detected and corrected.) Date: 11/28/2023; Time: 1:35 PM  Disclaimer:  Medicine is not an Visual merchandiser. The only guarantee in medicine is that nothing is guaranteed. It is important to note that the decision to proceed with this intervention was based on the information collected from the patient. The Data and conclusions were drawn from the patient's questionnaire, the interview, and the physical examination. Because the information was provided in large part by the patient, it cannot be guaranteed that it has not been purposely or unconsciously manipulated. Every effort has been  made to obtain as much relevant data as possible for this evaluation. It is important to note that the conclusions that lead to this procedure are derived in large part from the available data. Always take into account that the treatment will also be dependent on availability of resources and existing treatment guidelines, considered by other Pain Management Practitioners as being common knowledge and practice, at the time of the intervention. For Medico-Legal purposes, it is also important to point out that variation in procedural techniques and pharmacological choices are the acceptable norm. The indications, contraindications, technique, and results of the above procedure should only be interpreted and judged by a Board-Certified Interventional Pain Specialist with extensive familiarity and expertise in the same exact procedure and technique.

## 2023-11-28 NOTE — Patient Instructions (Signed)

## 2023-11-28 NOTE — Progress Notes (Signed)
 Safety precautions to be maintained throughout the outpatient stay will include: orient to surroundings, keep bed in low position, maintain call bell within reach at all times, provide assistance with transfer out of bed and ambulation.

## 2023-11-29 ENCOUNTER — Telehealth: Payer: Self-pay | Admitting: *Deleted

## 2023-11-29 NOTE — Telephone Encounter (Signed)
 No problems post procedure.

## 2023-12-04 ENCOUNTER — Other Ambulatory Visit: Payer: Self-pay

## 2023-12-04 ENCOUNTER — Other Ambulatory Visit: Payer: Self-pay | Admitting: Nurse Practitioner

## 2023-12-05 ENCOUNTER — Other Ambulatory Visit: Payer: Self-pay

## 2023-12-06 ENCOUNTER — Other Ambulatory Visit: Payer: Self-pay

## 2023-12-06 MED FILL — Sumatriptan Succinate Tab 100 MG: ORAL | 15 days supply | Qty: 9 | Fill #0 | Status: AC

## 2023-12-06 NOTE — Telephone Encounter (Signed)
 Requested Prescriptions  Pending Prescriptions Disp Refills   SUMAtriptan (IMITREX) 100 MG tablet 9 tablet 3    Sig: Take 1 tablet (100 mg total) by mouth every 2 (two) hours as needed for migraine. May repeat in 2 hours if headache persists or recurs.     Neurology:  Migraine Therapy - Triptan Passed - 12/06/2023 12:37 PM      Passed - Last BP in normal range    BP Readings from Last 1 Encounters:  11/28/23 108/78         Passed - Valid encounter within last 12 months    Recent Outpatient Visits           1 month ago Acute on chronic low back pain   [place] [facility] [place], Dorie Rank, NP       Future Appointments             In 2 months Anson Fret, MD [facility]

## 2023-12-14 ENCOUNTER — Ambulatory Visit: Attending: Pain Medicine | Admitting: Pain Medicine

## 2023-12-14 ENCOUNTER — Encounter: Payer: Self-pay | Admitting: Pain Medicine

## 2023-12-14 VITALS — BP 108/76 | HR 64 | Temp 97.2°F | Ht 68.0 in | Wt 195.0 lb

## 2023-12-14 DIAGNOSIS — M9904 Segmental and somatic dysfunction of sacral region: Secondary | ICD-10-CM | POA: Insufficient documentation

## 2023-12-14 DIAGNOSIS — M5459 Other low back pain: Secondary | ICD-10-CM | POA: Diagnosis not present

## 2023-12-14 DIAGNOSIS — M47816 Spondylosis without myelopathy or radiculopathy, lumbar region: Secondary | ICD-10-CM | POA: Diagnosis not present

## 2023-12-14 DIAGNOSIS — Z09 Encounter for follow-up examination after completed treatment for conditions other than malignant neoplasm: Secondary | ICD-10-CM | POA: Diagnosis not present

## 2023-12-14 DIAGNOSIS — G8929 Other chronic pain: Secondary | ICD-10-CM | POA: Insufficient documentation

## 2023-12-14 DIAGNOSIS — M533 Sacrococcygeal disorders, not elsewhere classified: Secondary | ICD-10-CM | POA: Insufficient documentation

## 2023-12-14 DIAGNOSIS — M545 Low back pain, unspecified: Secondary | ICD-10-CM | POA: Insufficient documentation

## 2023-12-14 NOTE — Patient Instructions (Addendum)
 Sacroiliac (SI) Joint Injection Patient Information  Description: The sacroiliac joint connects the scrum (very low back and tailbone) to the ilium (a pelvic bone which also forms half of the hip joint).  Normally this joint experiences very little motion.  When this joint becomes inflamed or unstable low back and or hip and pelvis pain may result.  Injection of this joint with local anesthetics (numbing medicines) and steroids can provide diagnostic information and reduce pain.  This injection is performed with the aid of x-ray guidance into the tailbone area while you are lying on your stomach.   You may experience an electrical sensation down the leg while this is being done.  You may also experience numbness.  We also may ask if we are reproducing your normal pain during the injection.  Conditions which may be treated SI injection:  Low back, buttock, hip or leg pain  Preparation for the Injection:  Do not eat any solid food or dairy products within 8 hours of your appointment.  You may drink clear liquids up to 3 hours before appointment.  Clear liquids include water, black coffee, juice or soda.  No milk or cream please. You may take your regular medications, including pain medications with a sip of water before your appointment.  Diabetics should hold regular insulin (if take separately) and take 1/2 normal NPH dose the morning of the procedure.  Carry some sugar containing items with you to your appointment. A driver must accompany you and be prepared to drive you home after your procedure. Bring all of your current medications with you. An IV may be inserted and sedation may be given at the discretion of the physician. A blood pressure cuff, EKG and other monitors will often be applied during the procedure.  Some patients may need to have extra oxygen administered for a short period.  You will be asked to provide medical information, including your allergies, prior to the procedure.  We  must know immediately if you are taking blood thinners (like Coumadin/Warfarin) or if you are allergic to IV iodine contrast (dye).  We must know if you could possible be pregnant.  Possible side effects:  Bleeding from needle site Infection (rare, may require surgery) Nerve injury (rare) Numbness & tingling (temporary) A brief convulsion or seizure Light-headedness (temporary) Pain at injection site (several days) Decreased blood pressure (temporary) Weakness in the leg (temporary)   Call if you experience:  New onset weakness or numbness of an extremity below the injection site that last more than 8 hours. Hives or difficulty breathing ( go to the emergency room) Inflammation or drainage at the injection site Any new symptoms which are concerning to you  Please note:  Although the local anesthetic injected can often make your back/ hip/ buttock/ leg feel good for several hours after the injections, the pain will likely return.  It takes 3-7 days for steroids to work in the sacroiliac area.  You may not notice any pain relief for at least that one week.  If effective, we will often do a series of three injections spaced 3-6 weeks apart to maximally decrease your pain.  After the initial series, we generally will wait some months before a repeat injection of the same type.  If you have any questions, please call [phone] [facility]   ______________________________________________________________________    Procedure instructions  Stop blood-thinners  Do not eat or drink fluids (other than water) for 6 hours before your  procedure  No water for 2 hours before your procedure  Take your blood pressure medicine with a sip of water  Arrive 30 minutes before your appointment  If sedation is planned, bring suitable driver. Pennie Banter, Benedetto Goad, & public transportation are NOT APPROVED)  Carefully read the "Preparing for your procedure" detailed  instructions  If you have questions call us at [phone]  Procedure appointments are for procedures only. NO medication refills or new problem evaluations.   ______________________________________________________________________      ______________________________________________________________________    Preparing for your procedure  Appointments: If you think you may not be able to keep your appointment, call 24-48 hours in advance to cancel. We need time to make it available to others.  Procedure visits are for procedures only. During your procedure appointment there will be: NO Prescription Refills*. NO medication changes or discussions*. NO discussion of disability issues*. NO unrelated pain problem evaluations*. NO evaluations to order other pain procedures*. *These will be addressed at a separate and distinct evaluation encounter on the provider's evaluation schedule and not during procedure days.  Instructions: Food intake: Avoid eating anything solid for at least 8 hours prior to your procedure. Clear liquid intake: You may take clear liquids such as water up to 2 hours prior to your procedure. (No carbonated drinks. No soda.) Transportation: Unless otherwise stated by your physician, bring a driver. (Driver cannot be a Market researcher, Pharmacist, community, or any other form of public transportation.) Morning Medicines: Except for blood thinners, take all of your other morning medications with a sip of water. Make sure to take your heart and blood pressure medicines. If your blood pressure's lower number is above 100, the case will be rescheduled. Blood thinners: Make sure to stop your blood thinners as instructed.  If you take a blood thinner, but were not instructed to stop it, call our office [phone] and ask to talk to a nurse. Not stopping a blood thinner prior to certain procedures could lead to serious complications. Diabetics on insulin: Notify the staff so that you can be scheduled  1st case in the morning. If your diabetes requires high dose insulin, take only  of your normal insulin dose the morning of the procedure and notify the staff that you have done so. Preventing infections: Shower with an antibacterial soap the morning of your procedure.  Build-up your immune system: Take 1000 mg of Vitamin C with every meal (3 times a day) the day prior to your procedure. Antibiotics: Inform the nursing staff if you are taking any antibiotics or if you have any conditions that may require antibiotics prior to procedures. (Example: recent joint implants)   Pregnancy: If you are pregnant make sure to notify the nursing staff. Not doing so may result in injury to the fetus, including death.  Sickness: If you have a cold, fever, or any active infections, call and cancel or reschedule your procedure. Receiving steroids while having an infection may result in complications. Arrival: You must be in the facility at least 30 minutes prior to your scheduled procedure. Tardiness: Your scheduled time is also the cutoff time. If you do not arrive at least 15 minutes prior to your procedure, you will be rescheduled.  Children: Do not bring any children with you. Make arrangements to keep them home. Dress appropriately: There is always a possibility that your clothing may get soiled. Avoid long dresses. Valuables: Do not bring any jewelry or valuables.  Reasons to call and reschedule or cancel your procedure: (Following  these recommendations will minimize the risk of a serious complication.) Surgeries: Avoid having procedures within 2 weeks of any surgery. (Avoid for 2 weeks before or after any surgery). Flu Shots: Avoid having procedures within 2 weeks of a flu shots or . (Avoid for 2 weeks before or after immunizations). Barium: Avoid having a procedure within 7-10 days after having had a radiological study involving the use of radiological contrast. (Myelograms, Barium swallow or enema  study). Heart attacks: Avoid any elective procedures or surgeries for the initial 6 months after a "Myocardial Infarction" (Heart Attack). Blood thinners: It is imperative that you stop these medications before procedures. Let us know if you if you take any blood thinner.  Infection: Avoid procedures during or within two weeks of an infection (including chest colds or gastrointestinal problems). Symptoms associated with infections include: Localized redness, fever, chills, night sweats or profuse sweating, burning sensation when voiding, cough, congestion, stuffiness, runny nose, sore throat, diarrhea, nausea, vomiting, cold or Flu symptoms, recent or current infections. It is specially important if the infection is over the area that we intend to treat. Heart and lung problems: Symptoms that may suggest an active cardiopulmonary problem include: cough, chest pain, breathing difficulties or shortness of breath, dizziness, ankle swelling, uncontrolled high or unusually low blood pressure, and/or palpitations. If you are experiencing any of these symptoms, cancel your procedure and contact your primary care physician for an evaluation.  Remember:  Regular Business hours are:  Monday to Thursday 8:00 AM to 4:00 PM  Provider's Schedule: Delano Metz, MD:  Procedure days: Tuesday and Thursday 7:30 AM to 4:00 PM  Edward Jolly, MD:  Procedure days: Monday and Wednesday 7:30 AM to 4:00 PM Last  Updated: 08/15/2023 ______________________________________________________________________      ______________________________________________________________________    General Risks and Possible Complications  Patient Responsibilities: It is important that you read this as it is part of your informed consent. It is our duty to inform you of the risks and possible complications associated with treatments offered to you. It is your responsibility as a patient to read this and to ask questions about  anything that is not clear or that you believe was not covered in this document.  Patient's Rights: You have the right to refuse treatment. You also have the right to change your mind, even after initially having agreed to have the treatment done. However, under this last option, if you wait until the last second to change your mind, you may be charged for the materials used up to that point.  Introduction: Medicine is not an Visual merchandiser. Everything in Medicine, including the lack of treatment(s), carries the potential for danger, harm, or loss (which is by definition: Risk). In Medicine, a complication is a secondary problem, condition, or disease that can aggravate an already existing one. All treatments carry the risk of possible complications. The fact that a side effects or complications occurs, does not imply that the treatment was conducted incorrectly. It must be clearly understood that these can happen even when everything is done following the highest safety standards.  No treatment: You can choose not to proceed with the proposed treatment alternative. The "PRO(s)" would include: avoiding the risk of complications associated with the therapy. The "CON(s)" would include: not getting any of the treatment benefits. These benefits fall under one of three categories: diagnostic; therapeutic; and/or palliative. Diagnostic benefits include: getting information which can ultimately lead to improvement of the disease or symptom(s). Therapeutic benefits are those associated with  the successful treatment of the disease. Finally, palliative benefits are those related to the decrease of the primary symptoms, without necessarily curing the condition (example: decreasing the pain from a flare-up of a chronic condition, such as incurable terminal cancer).  General Risks and Complications: These are associated to most interventional treatments. They can occur alone, or in combination. They fall under one of the  following six (6) categories: no benefit or worsening of symptoms; bleeding; infection; nerve damage; allergic reactions; and/or death. No benefits or worsening of symptoms: In Medicine there are no guarantees, only probabilities. No healthcare provider can ever guarantee that a medical treatment will work, they can only state the probability that it may. Furthermore, there is always the possibility that the condition may worsen, either directly, or indirectly, as a consequence of the treatment. Bleeding: This is more common if the patient is taking a blood thinner, either prescription or over the counter (example: Goody Powders, Fish oil, Aspirin, Garlic, etc.), or if suffering a condition associated with impaired coagulation (example: Hemophilia, cirrhosis of the liver, low platelet counts, etc.). However, even if you do not have one on these, it can still happen. If you have any of these conditions, or take one of these drugs, make sure to notify your treating physician. Infection: This is more common in patients with a compromised immune system, either due to disease (example: diabetes, cancer, human immunodeficiency virus [HIV], etc.), or due to medications or treatments (example: therapies used to treat cancer and rheumatological diseases). However, even if you do not have one on these, it can still happen. If you have any of these conditions, or take one of these drugs, make sure to notify your treating physician. Nerve Damage: This is more common when the treatment is an invasive one, but it can also happen with the use of medications, such as those used in the treatment of cancer. The damage can occur to small secondary nerves, or to large primary ones, such as those in the spinal cord and brain. This damage may be temporary or permanent and it may lead to impairments that can range from temporary numbness to permanent paralysis and/or brain death. Allergic Reactions: Any time a substance or material  comes in contact with our body, there is the possibility of an allergic reaction. These can range from a mild skin rash (contact dermatitis) to a severe systemic reaction (anaphylactic reaction), which can result in death. Death: In general, any medical intervention can result in death, most of the time due to an unforeseen complication. ______________________________________________________________________

## 2023-12-14 NOTE — Progress Notes (Signed)
 PROVIDER NOTE: Interpretation of information contained herein should be left to medically-trained personnel. Specific patient instructions are provided elsewhere under "Patient Instructions" section of medical record. This document was created in part using AI and STT-dictation technology, any transcriptional errors that may result from this process are unintentional.  Patient: Jennifer Morse  Service: E/M   PCP: Marjie Skiff, NP  DOB: [birth year]  DOS: 12/14/2023  Provider: Oswaldo Done, MD  MRN: [id number]  Delivery: Face-to-face  Specialty: Interventional Pain Management  Type: Established Patient  Setting: Ambulatory outpatient facility  Specialty designation: 09  Referring Prov.: Marjie Skiff, NP  Location: Outpatient office facility       HPI  Ms. DANNIELL ROTUNDO, a 33 y.o. year old female, is here today because of her Chronic bilateral low back pain without sciatica [M54.50, G89.29]. Ms. Herzberg primary complain today is Back Pain  Pertinent problems: Ms. Jolley has Migraine; Acute on chronic low back pain (Bilateral) (L>R); Chronic pain syndrome; Abnormal MRI, lumbar spine (11/06/2023); Lumbosacral intervertebral disc herniation (L5-S1); Lumbar disc herniation (L3-4); Lumbar intervertebral disc protrusion (L3-4); Lumbar facet hypertrophy; Chronic low back pain (1ry area of Pain) (Bilateral) (L>R) w/o sciatica; Vertebrogenic low back pain; Low back pain of over 3 months duration; Mechanical low back pain; Multifactorial low back pain; Lumbar facet joint pain (Bilateral) (R>L); Chronic hip pain (2ry area of Pain) (Bilateral) (R>L); Grade 1 Lumbar Retrolisthesis of L3/L4 & L5/S1; DDD (degenerative disc disease), lumbosacral (L5-S1) w/ LBP; Impaired range of motion of hip (Right); Spondylosis without myelopathy or radiculopathy, lumbosacral region; Lumbar facet joint syndrome; Osteoarthritis of lumbar spine; Osteoarthritis of facet joint of lumbar spine; Sacroiliac joint  dysfunction of sides (Bilateral) (L>R); Chronic sacroiliac joint pain (Bilateral) (L>R); Disorder of sacroiliac joints (Bilateral) (L>R); and Somatic dysfunction of sacroiliac joints (Bilateral) (L>R) on their pertinent problem list. Pain Assessment: Severity of Chronic pain is reported as a 7 /10. Location: Back Lower/left buttocks. Onset: More than a month ago. Quality: Aching, Constant, Discomfort, Burning. Timing: Constant. Modifying factor(s): stretching. Vitals:  height is 5\' 8"  (1.727 m) and weight is 195 lb (88.5 kg). Her temperature is 97.2 F (36.2 C) (abnormal). Her blood pressure is 108/76 and her pulse is 64. Her oxygen saturation is 97%.  BMI: Estimated body mass index is 29.65 kg/m as calculated from the following:   Height as of this encounter: 5\' 8"  (1.727 m).   Weight as of this encounter: 195 lb (88.5 kg). Last encounter: 11/15/2023. Last procedure: 11/28/2023.  Reason for encounter: post-procedure evaluation and assessment.   Discussed the use of AI scribe software for clinical note transcription with the patient, who gave verbal consent to proceed.  History of Present Illness   Jennifer Morse is a 33 year old female who presents with persistent back pain following a facet block procedure.  She underwent a bilateral facet block procedure for back pain. Initially, she experienced numbness and a reduction in pain, describing it as 'tolerable' and not as severe as her usual pain. The pressure was noted on the left side, slightly lower than the injection site. The relief lasted for the first day, but the pain returned to its usual intensity by the next morning.  Her mobility has improved, but the back pain persists, particularly on the left side. The right-side experiences only a 'twinge of pressure' which is 'totally tolerable'. The pain is described as a 'six or seven' on a daily basis, exacerbated by bending, turning over in bed, or lifting  objects. The pain radiates down her  left buttock but does not extend down her legs. No significant leg pain is reported.  She has previously attended physical therapy but is not currently participating. She uses a lower back brace, especially during work, to prevent exacerbation of her pain. She has a puppy at home.   I have reviewed the copies of the MRI, including the MRI of the shoulder, which we previously discussed. Additionally, I obtained the MRI results for the lumbar spine as well as the pelvis. 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 she is already seeing someone for that. She is scheduled for removal of the neoplastic lesions, and she has an oncologist.  I have received your copies of the MRI, including the MRI of the shoulder, which we previously discussed. Additionally, I obtained the MRI results for the lumbar spine as well as the pelvis. Are you aware that there are cystic masses in the pelvic area? I understand that you are already seeing someone for that, which is great. I just wanted to confirm that I have a copy of that information.  I had the nurse come in, and we are finished with that part. I also plan to refer you to an orthopedic surgeon. For the removal of those masses, is that being handled by a general surgeon or oncology? I believe it is oncology. The doctor has scheduled a pre-operative visual appointment for you, but I have not received a specific date or time for that appointment yet. I think it is supposed to be on June 6, but we will confirm that soon.  The important thing is that it is being taken care of. Thank you for your understanding.      Post-procedure evaluation   Type: Lumbar Facet, Medial Branch Block(s) (w/ fluoroscopic mapping) #1  Laterality: Bilateral  Level: L2, L3, L4, L5, and S1 Medial Branch Level(s). Injecting these levels blocks the L3-4, L4-5, and L5-S1 lumbar facet joints. Imaging: Fluoroscopic guidance Spinal ([id number]) Anesthesia: Local anesthesia (1-2%  Lidocaine) Anxiolysis: IV Versed         Sedation: Moderate Sedation                       DOS: 11/28/2023 Performed by: Oswaldo Done, MD  Primary Purpose: Diagnostic/Therapeutic Indications: Low back pain severe enough to impact quality of life or function. 1. Chronic low back pain (1ry area of Pain) (Bilateral) (L>R) w/o sciatica   2. Grade 1 Lumbar Retrolisthesis of L3/L4 & L5/S1   3. Low back pain of over 3 months duration   4. Lumbar facet hypertrophy   5. Lumbar facet joint pain (Bilateral) (R>L)   6. Spondylosis without myelopathy or radiculopathy, lumbosacral region   7. Lumbar facet joint syndrome   8. Osteoarthritis of lumbar spine   9. Osteoarthritis of facet joint of lumbar spine   10. Mechanical low back pain    NAS-11 Pain score:   Pre-procedure: 7 /10   Post-procedure: 5 /10    Effectiveness:  Initial hour after procedure: 75 %. Subsequent 4-6 hours post-procedure: 75 %. Analgesia past initial 6 hours:  (right  90%  Left 0%). Ongoing improvement:  Analgesic: The patient refers having attained a 75% improvement of her back pain for the duration of the local anesthetic but she denies having attained 100% relief.  She states that on the right side it improved to an ongoing 90% benefit but on the left side that pain  has returned to baseline. Function: Ms. Plouff reports improvement in function ROM: Ms. Borys reports improvement in ROM   Pharmacotherapy Assessment  Analgesic: No chronic opioid analgesics therapy prescribed by our practice. hydrocodone/APAP 10/325, 1 tab p.o. 4 times daily  MME/day: 40 mg/day    Monitoring: [place] PMP: PDMP reviewed during this encounter.       Pharmacotherapy: No side-effects or adverse reactions reported. Compliance: No problems identified. Effectiveness: Clinically acceptable.  Valerie Salts, RN  12/14/2023  2:02 PM  Sign when Signing Visit Safety precautions to be maintained throughout the outpatient stay will include:  orient to surroundings, keep bed in low position, maintain call bell within reach at all times, provide assistance with transfer out of bed and ambulation.    No results found for: "CBDTHCR" No results found for: "D8THCCBX" No results found for: "D9THCCBX"  UDS:  Summary  Date Value Ref Range Status  11/15/2023 FINAL  Final    Comment:    ==================================================================== Compliance Drug Analysis, Ur ==================================================================== Test                             Result       Flag       Units  Drug Present and Declared for Prescription Verification   Bupropion                      PRESENT      EXPECTED   Hydroxybupropion               PRESENT      EXPECTED    Hydroxybupropion is an expected metabolite of bupropion.  Drug Present not Declared for Prescription Verification   Amphetamine                    899          UNEXPECTED ng/mg creat    Amphetamine is available as a schedule II prescription drug.    Desmethylcyclobenzaprine       PRESENT      UNEXPECTED    Desmethylcyclobenzaprine is an expected metabolite of    cyclobenzaprine.    Methocarbamol                  PRESENT      UNEXPECTED  Drug Absent but Declared for Prescription Verification   Gabapentin                     Not Detected UNEXPECTED   Acetaminophen                  Not Detected UNEXPECTED    Acetaminophen, as indicated in the declared medication list, is not    always detected even when used as directed.    Salicylate                     Not Detected UNEXPECTED    Aspirin, as indicated in the declared medication list, is not always    detected even when used as directed.  ==================================================================== Test                      Result    Flag   Units      Ref Range   Creatinine              90  mg/dL       >=40 ==================================================================== Declared Medications:  The flagging and interpretation on this report are based on the  following declared medications.  Unexpected results may arise from  inaccuracies in the declared medications.   **Note: The testing scope of this panel includes these medications:   Bupropion (Wellbutrin XL)  Gabapentin (Neurontin)   **Note: The testing scope of this panel does not include small to  moderate amounts of these reported medications:   Acetaminophen (Excedrin)  Aspirin (Excedrin)   **Note: The testing scope of this panel does not include the  following reported medications:   Botulinum (Botox)  Budesonide (Symbicort)  Buspirone (Buspar)  Caffeine (Excedrin)  Formoterol (Symbicort)  Fremanezumab (Ajovy)  Hydrocortisone  Levonorgestrel (Mirena)  Lubiprostone (Amitiza)  Rimegepant (Nurtec)  Sumatriptan (Imitrex) ==================================================================== For clinical consultation, please call [phone]. ====================================================================       ROS  Constitutional: Denies any fever or chills Gastrointestinal: No reported hemesis, hematochezia, vomiting, or acute GI distress Musculoskeletal: Denies any acute onset joint swelling, redness, loss of ROM, or weakness Neurological: No reported episodes of acute onset apraxia, aphasia, dysarthria, agnosia, amnesia, paralysis, loss of coordination, or loss of consciousness  Medication Review  Fremanezumab-vfrm, Rimegepant Sulfate, SUMAtriptan, aspirin-acetaminophen-caffeine, azelastine, botulinum toxin Type A, buPROPion, budesonide-formoterol, busPIRone, gabapentin, hydrocortisone, levonorgestrel, lubiprostone, and phentermine  History Review  Allergy: Ms. Gannett is allergic to propranolol. Drug: Ms. Bubar  reports no history of drug use. Alcohol:  reports that she does not currently use  alcohol after a past usage of about 1.0 standard drink of alcohol per week. Tobacco:  reports that she quit smoking about 8 years ago. Her smoking use included cigarettes. She has never used smokeless tobacco. Social: Ms. Hon  reports that she quit smoking about 8 years ago. Her smoking use included cigarettes. She has never used smokeless tobacco. She reports that she does not currently use alcohol after a past usage of about 1.0 standard drink of alcohol per week. She reports that she does not use drugs. Medical:  has a past medical history of Anxiety, BRCA negative (07/2021), Degenerative lumbar disc, Frequent headaches, Gestational thrombocytopenia (HCC), Lumbar herniated disc, Migraine with aura, intractable, with status migrainosus (1995), Oligomenorrhea, and Postpartum depression. Surgical: Ms. Delawder  has a past surgical history that includes Wisdom tooth extraction (2012). Family: family history includes Diabetes in her maternal aunt, maternal grandfather, maternal grandmother, mother, and sister; Heart disease in her maternal grandfather and maternal grandmother; Hypertension in her maternal grandfather and maternal grandmother; Kidney cancer in her maternal grandmother; Migraines in her child, child, maternal grandfather, and mother; Ovarian cancer in her mother.  Laboratory Chemistry Profile   Renal Lab Results  Component Value Date   BUN 15 09/13/2023   CREATININE 0.82 09/13/2023   BCR 18 09/13/2023   GFR 94.11 08/19/2020   GFRAA 120 03/02/2018   GFRNONAA >60 11/21/2021    Hepatic Lab Results  Component Value Date   AST 19 09/13/2023   ALT 25 09/13/2023   ALBUMIN 4.4 09/13/2023   ALKPHOS 86 09/13/2023   LIPASE 34 11/21/2021    Electrolytes Lab Results  Component Value Date   NA 141 09/13/2023   K 3.8 09/13/2023   CL 102 09/13/2023   CALCIUM 9.6 09/13/2023   MG 2.0 11/15/2023    Bone Lab Results  Component Value Date   VD25OH 30.7 10/19/2022   25OHVITD1 28 (L)  11/15/2023   25OHVITD2 <1.0 11/15/2023   25OHVITD3 28 11/15/2023   TESTOFREE 0.7 11/27/2020  TESTOSTERONE 16 11/27/2020    Inflammation (CRP: Acute Phase) (ESR: Chronic Phase) Lab Results  Component Value Date   CRP 1 11/15/2023   ESRSEDRATE 10 11/15/2023         Note: Above Lab results reviewed.  Recent Imaging Review  DG PAIN CLINIC C-ARM 1-60 MIN NO REPORT Fluoro was used, but no Radiologist interpretation will be provided.  Please refer to "NOTES" tab for provider progress note. Note: Reviewed        Physical Exam  General appearance: Well nourished, well developed, and well hydrated. In no apparent acute distress Mental status: Alert, oriented x 3 (person, place, & time)       Respiratory: No evidence of acute respiratory distress Eyes: PERLA Vitals: BP 108/76   Pulse 64   Temp (!) 97.2 F (36.2 C)   Ht 5\' 8"  (1.727 m)   Wt 195 lb (88.5 kg)   SpO2 97%   BMI 29.65 kg/m  BMI: Estimated body mass index is 29.65 kg/m as calculated from the following:   Height as of this encounter: 5\' 8"  (1.727 m).   Weight as of this encounter: 195 lb (88.5 kg). Ideal: Ideal body weight: 63.9 kg (140 lb 14 oz) Adjusted ideal body weight: 73.7 kg (162 lb 8.4 oz)  Physical Exam   MUSCULOSKELETAL: Pain in the left hip area on pressure. Pain in the left sacroiliac joint on Patrick maneuver. Pain in the lower back on palpation.       Assessment   Diagnosis Status  1. Chronic low back pain (1ry area of Pain) (Bilateral) (L>R) w/o sciatica   2. Low back pain of over 3 months duration   3. Lumbar facet joint pain (Bilateral) (R>L)   4. Lumbar facet joint syndrome   5. Postop check   6. Sacroiliac joint dysfunction of both sides   7. Chronic sacroiliac joint pain   8. Disorder of both sacroiliac joints   9. Somatic dysfunction of both sacroiliac joints    Recurring Improving Improved   Updated Problems: Problem  Sacroiliac joint dysfunction of sides (Bilateral) (L>R)   Chronic sacroiliac joint pain (Bilateral) (L>R)  Disorder of sacroiliac joints (Bilateral) (L>R)  Somatic dysfunction of sacroiliac joints (Bilateral) (L>R)    Plan of Care  Problem-specific:  Assessment and Plan    Chronic Low Back Pain with Sacroiliac Joint Dysfunction   She experiences chronic low back pain primarily on the left side, rated 6-7/10, worsened by bending, turning over in bed, or lifting. A recent facet block provided temporary relief, indicating an inflammatory component, but the return of pain suggests a multifactorial etiology. The Patrick maneuver indicated significant pain from the left sacroiliac joint, suggesting SI joint dysfunction. The absence of leg radiation makes disc herniation less likely. Numbing medicine offers short-term relief and diagnostic information, while steroids may provide long-term relief if swelling is involved. There may be compensatory pain due to altered biomechanics and possible swelling recurrence. Schedule bilateral sacroiliac joint injections with sedation. Monitor pain relief during the numbing period to assess SI joint contribution. Consider an SI joint belt if recommended by a physical therapist. Advise against continuous back brace use to prevent dependency and muscle weakening.      Ms. BRIETTA MANSO has a current medication list which includes the following long-term medication(s): budesonide-formoterol, bupropion, gabapentin, levonorgestrel, sumatriptan, [DISCONTINUED] azelastine, and [DISCONTINUED] phentermine.  Pharmacotherapy (Medications Ordered): No orders of the defined types were placed in this encounter.  Orders:  Orders Placed This Encounter  Procedures   SACROILIAC JOINT INJECTION    Physical Examination Findings: Positive Sacral Thrust (Sacral Spring, Downward Pressure): (Y) Positive FABER maneuver (Patrick's): (Y) Positive SI distraction (Gapping): (Y) Positive SI compression (Approximation): (Y) Positive Thigh  Thrust:  (Y) Positive Gaenslen's: (Y) Positive Sacral Sulcus Tenderness: (Y)    Standing Status:   Future    Expiration Date:   03/14/2024    Scheduling Instructions:     Procedure: Sacroiliac Joint Injection     Side  Laterality: Bilateral     Sedation: With Sedation.     Timeframe: As soon as schedule allows.    Where will this procedure be performed?:   [facility]   Nursing Instructions:    Please complete this patient's postprocedure evaluation.    Scheduling Instructions:     Please complete this patient's postprocedure evaluation.   Follow-up plan:   Return for (ECT): (B) SI Blk #1.     Interventional Therapies  Risk Factors  Considerations  Medical Comorbidities:     Planned  Pending:   Diagnostic bilateral SI joint block #1    Under consideration:   Diagnostic/therapeutic lumbar facet MBB #2  Diagnostic/therapeutic right IA hip inj. #1  Diagnostic/therapeutic L3-4 LESI #1  Diagnostic/therapeutic L5-S1 LESI #1  Diagnostic/therapeutic L5 TFESI #1    Completed:   Diagnostic/therapeutic lumbar facet MBB x1 (11/28/2023) (75/75/80/R:80L:0)    Therapeutic  Palliative (PRN) options:   None established   Completed by other providers:   Diagnostic/therapeutic left interlaminar L5-S1 LESI x2 (05/28/2020) by Malachi Pro, MD [facility])      Recent Visits Date Type Provider Dept  11/28/23 Procedure visit Delano Metz, MD [facility]  11/15/23 Office Visit Delano Metz, MD [facility]  Showing recent visits within past 90 days and meeting all other requirements Today's Visits Date Type Provider Dept  12/14/23 Office Visit Delano Metz, MD [facility]  Showing today's visits and meeting all other requirements Future Appointments Date Type Provider Dept  01/02/24 Appointment Delano Metz, MD [facility]  Showing future appointments within next 90 days and meeting all other  requirements  I discussed the assessment and treatment plan with the patient. The patient was provided an opportunity to ask questions and all were answered. The patient agreed with the plan and demonstrated an understanding of the instructions.  Patient advised to call back or seek an in-person evaluation if the symptoms or condition worsens.  Duration of encounter: 71 minutes.  Total time on encounter, as per AMA guidelines included both the face-to-face and non-face-to-face time personally spent by the physician and/or other qualified health care professional(s) on the day of the encounter (includes time in activities that require the physician or other qualified health care professional and does not include time in activities normally performed by clinical staff). Physician's time may include the following activities when performed: Preparing to see the patient (e.g., pre-charting review of records, searching for previously ordered imaging, lab work, and nerve conduction tests) Review of prior analgesic pharmacotherapies. Reviewing PMP Interpreting ordered tests (e.g., lab work, imaging, nerve conduction tests) Performing post-procedure evaluations, including interpretation of diagnostic procedures Obtaining and/or reviewing separately obtained history Performing a medically appropriate examination and/or evaluation Counseling and educating the patient/family/caregiver Ordering medications, tests, or procedures Referring and communicating with other health care professionals (when not separately reported) Documenting clinical information in the electronic or other health record Independently interpreting results (not separately reported) and communicating results to the patient/ family/caregiver Care coordination (not  separately reported)  Note by: Oswaldo Done, MD (TTS and AI technology used. I apologize for any typographical errors that were not detected and corrected.) Date:  12/14/2023; Time: 4:03 PM

## 2023-12-14 NOTE — Progress Notes (Signed)
 Safety precautions to be maintained throughout the outpatient stay will include: orient to surroundings, keep bed in low position, maintain call bell within reach at all times, provide assistance with transfer out of bed and ambulation.

## 2023-12-27 ENCOUNTER — Other Ambulatory Visit: Payer: Self-pay | Admitting: Nurse Practitioner

## 2023-12-27 ENCOUNTER — Other Ambulatory Visit: Payer: Self-pay

## 2023-12-27 MED FILL — Bupropion HCl Tab ER 24HR 300 MG: ORAL | 90 days supply | Qty: 90 | Fill #3 | Status: AC

## 2023-12-27 MED FILL — Buspirone HCl Tab 7.5 MG: ORAL | 90 days supply | Qty: 180 | Fill #3 | Status: AC

## 2023-12-27 MED FILL — Sumatriptan Succinate Tab 100 MG: ORAL | 15 days supply | Qty: 9 | Fill #1 | Status: AC

## 2023-12-28 ENCOUNTER — Other Ambulatory Visit: Payer: Self-pay

## 2023-12-28 NOTE — Telephone Encounter (Signed)
 Requested medication (s) are due for refill today: yes  Requested medication (s) are on the active medication list: yes  Last refill:  04/21/23 #90 1 RF  Future visit scheduled: no  Notes to clinic:  Needs appt- Called pt and LM on VM to make appt   Requested Prescriptions  Pending Prescriptions Disp Refills   lubiprostone  (AMITIZA ) 24 MCG capsule 180 capsule 4    Sig: Take 1 capsule (24 mcg total) by mouth 2 (two) times daily with a meal.     Gastroenterology: Irritable Bowel Syndrome - lubiprostone  Passed - 12/28/2023 12:59 PM      Passed - AST in normal range and within 360 days    AST  Date Value Ref Range Status  09/13/2023 19 0 - 40 IU/L Final         Passed - ALT in normal range and within 360 days    ALT  Date Value Ref Range Status  09/13/2023 25 0 - 32 IU/L Final         Passed - Valid encounter within last 12 months    Recent Outpatient Visits           2 months ago Acute on chronic low back pain   [facility] [place], Lavelle Posey, NP       Future Appointments             In 1 month Glory Larsen, MD [facility]

## 2023-12-29 ENCOUNTER — Other Ambulatory Visit: Payer: Self-pay

## 2023-12-30 ENCOUNTER — Encounter: Payer: Self-pay | Admitting: Nurse Practitioner

## 2023-12-30 DIAGNOSIS — E66811 Obesity, class 1: Secondary | ICD-10-CM

## 2024-01-01 ENCOUNTER — Other Ambulatory Visit: Payer: Self-pay

## 2024-01-01 MED ORDER — LUBIPROSTONE 24 MCG PO CAPS
24.0000 ug | ORAL_CAPSULE | Freq: Two times a day (BID) | ORAL | 4 refills | Status: AC
  Filled 2024-01-01 – 2024-02-06 (×2): qty 180, 90d supply, fill #0
  Filled 2024-06-13: qty 180, 90d supply, fill #1

## 2024-01-01 NOTE — Addendum Note (Signed)
 Addended by: Charleigh Correnti T on: 01/01/2024 08:47 AM   Modules accepted: Orders

## 2024-01-02 ENCOUNTER — Ambulatory Visit: Attending: Pain Medicine | Admitting: Pain Medicine

## 2024-01-02 ENCOUNTER — Ambulatory Visit
Admission: RE | Admit: 2024-01-02 | Discharge: 2024-01-02 | Disposition: A | Discharge status: Home / Self Care | Source: Ambulatory Visit | Attending: Pain Medicine | Admitting: Pain Medicine

## 2024-01-02 ENCOUNTER — Encounter: Payer: Self-pay | Admitting: Pain Medicine

## 2024-01-02 ENCOUNTER — Other Ambulatory Visit: Payer: Self-pay

## 2024-01-02 VITALS — BP 110/77 | HR 70 | Temp 97.2°F | Resp 13 | Ht 68.0 in | Wt 200.0 lb

## 2024-01-02 DIAGNOSIS — M9904 Segmental and somatic dysfunction of sacral region: Secondary | ICD-10-CM | POA: Insufficient documentation

## 2024-01-02 DIAGNOSIS — M533 Sacrococcygeal disorders, not elsewhere classified: Secondary | ICD-10-CM | POA: Insufficient documentation

## 2024-01-02 DIAGNOSIS — G8929 Other chronic pain: Secondary | ICD-10-CM | POA: Insufficient documentation

## 2024-01-02 DIAGNOSIS — M545 Low back pain, unspecified: Secondary | ICD-10-CM | POA: Diagnosis not present

## 2024-01-02 DIAGNOSIS — Z5189 Encounter for other specified aftercare: Secondary | ICD-10-CM

## 2024-01-02 DIAGNOSIS — M47898 Other spondylosis, sacral and sacrococcygeal region: Secondary | ICD-10-CM | POA: Insufficient documentation

## 2024-01-02 MED ORDER — MIDAZOLAM HCL 5 MG/5ML IJ SOLN
INTRAMUSCULAR | Status: AC
  Filled 2024-01-02: qty 5

## 2024-01-02 MED ORDER — ROPIVACAINE HCL 2 MG/ML IJ SOLN
INTRAMUSCULAR | Status: AC
  Filled 2024-01-02: qty 20

## 2024-01-02 MED ORDER — FENTANYL CITRATE (PF) 100 MCG/2ML IJ SOLN
25.0000 ug | INTRAMUSCULAR | Status: DC | PRN
  Administered 2024-01-02: 50 ug via INTRAVENOUS

## 2024-01-02 MED ORDER — LIDOCAINE HCL 2 % IJ SOLN
20.0000 mL | Freq: Once | INTRAMUSCULAR | Status: AC
  Administered 2024-01-02: 400 mg

## 2024-01-02 MED ORDER — LIDOCAINE HCL 2 % IJ SOLN
INTRAMUSCULAR | Status: AC
  Filled 2024-01-02: qty 20

## 2024-01-02 MED ORDER — METHYLPREDNISOLONE ACETATE 80 MG/ML IJ SUSP
INTRAMUSCULAR | Status: AC
  Filled 2024-01-02: qty 1

## 2024-01-02 MED ORDER — ROPIVACAINE HCL 2 MG/ML IJ SOLN
9.0000 mL | Freq: Once | INTRAMUSCULAR | Status: AC
Start: 2024-01-02 — End: 2024-01-02
  Administered 2024-01-02: 9 mL via INTRA_ARTICULAR

## 2024-01-02 MED ORDER — PENTAFLUOROPROP-TETRAFLUOROETH EX AERO
INHALATION_SPRAY | Freq: Once | CUTANEOUS | Status: AC
  Administered 2024-01-02: 30 via TOPICAL

## 2024-01-02 MED ORDER — FENTANYL CITRATE (PF) 100 MCG/2ML IJ SOLN
INTRAMUSCULAR | Status: AC
  Filled 2024-01-02: qty 2

## 2024-01-02 MED ORDER — METHYLPREDNISOLONE ACETATE 80 MG/ML IJ SUSP
80.0000 mg | Freq: Once | INTRAMUSCULAR | Status: AC
Start: 2024-01-02 — End: 2024-01-02
  Administered 2024-01-02: 80 mg via INTRA_ARTICULAR

## 2024-01-02 MED ORDER — MIDAZOLAM HCL 5 MG/5ML IJ SOLN
0.5000 mg | Freq: Once | INTRAMUSCULAR | Status: AC
Start: 2024-01-02 — End: 2024-01-02
  Administered 2024-01-02: 4 mg via INTRAVENOUS

## 2024-01-02 NOTE — Progress Notes (Signed)
 PROVIDER NOTE: Interpretation of information contained herein should be left to medically-trained personnel. Specific patient instructions are provided elsewhere under "Patient Instructions" section of medical record. This document was created in part using STT-dictation technology, any transcriptional errors that may result from this process are unintentional.  Patient: Jennifer Morse Type: Established DOB: 14-Feb-1991 MRN: [id number] PCP: Jennifer Pyles, NP  Service: Procedure DOS: 01/02/2024 Setting: Ambulatory Location: Ambulatory outpatient facility Delivery: Face-to-face Provider: Candi Chafe, MD Specialty: Interventional Pain Management Specialty designation: 09 Location: Outpatient facility Ref. Prov.: Morse, Jennifer T, NP       Interventional Therapy   Procedure: Sacroiliac Joint Steroid Injection #1    Laterality: Bilateral     Level: PSIS (Posterior Superior Iliac Spine)  Target: Interarticular sacroiliac joint. Location: Medial to the postero-medial edge of iliac spine. Region: Lumbosacral-sacrococcygeal. Approach:  Inferior on left and superior on right  postero-medial percutaneous approach. Type of procedure: Percutaneous joint injection.  Imaging: Fluoroscopy-guided Non-spinal ([id number]) Anesthesia: Local anesthesia (1-2% Lidocaine ) Anxiolysis: IV Versed  4.0 mg Sedation: Moderate Sedation Fentanyl  1 mL (50 mcg) DOS: 01/02/2024  Performed by: Jennifer Chafe, MD  Purpose: Diagnostic/Therapeutic Indications: Sacroiliac joint pain in the lower back and hip area severe enough to impact quality of life or function. Rationale (medical necessity): procedure needed and proper for the diagnosis and/or treatment of Jennifer Morse's medical symptoms and needs. 1. Chronic low back pain (1ry area of Pain) (Bilateral) (L>R) w/o sciatica   2. Chronic sacroiliac joint pain (Bilateral) (L>R)   3. 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 Somatic dysfunction of sacroiliac joints (Bilateral) (L>R)   6. Other spondylosis, sacral and sacrococcygeal region   7. Multifactorial low back pain    NAS-11 Pain score:   Pre-procedure: 6 /10   Post-procedure: 5 /10      Position / Prep / Materials:  Position: Prone  Prep solution: ChloraPrep (2% chlorhexidine gluconate and 70% isopropyl alcohol) Prep Area: Entire posterior lumbosacral area  Materials:  Tray: Block Needle(s):  Type: Spinal  Gauge (G): 22  Length: 5-in Qty: One (1) per procedure side.  H&P (Pre-op Assessment):  Jennifer Morse is a 33 y.o. (year old), female patient, seen today for interventional treatment. She  has a past surgical history that includes Wisdom tooth extraction (2012). Jennifer Morse has a current medication list which includes the following prescription(s): aspirin-acetaminophen -caffeine , botulinum toxin type a , budesonide -formoterol , bupropion , buspirone , ajovy , gabapentin , hydrocortisone , levonorgestrel , lubiprostone , nurtec, sumatriptan , [DISCONTINUED] azelastine , and [DISCONTINUED] phentermine , and the following Facility-Administered Medications: fentanyl  and fremanezumab -vfrm. Her primarily concern today is the Back Pain (Low left)  Initial Vital Signs:  Pulse/HCG Rate: 70ECG Heart Rate: 65 (nsr) Temp: (!) 97.3 F (36.3 C) Resp: 16 BP: 108/75 SpO2: 100 %  BMI: Estimated body mass index is 30.41 kg/m as calculated from the following:   Height as of this encounter: 5\' 8"  (1.727 m).   Weight as of this encounter: 200 lb (90.7 kg).  Risk Assessment: Allergies: Reviewed. She is allergic to propranolol .  Allergy Precautions: None required Coagulopathies: Reviewed. None identified.  Blood-thinner therapy: None at this time Active Infection(s): Reviewed. None identified. Jennifer Morse is afebrile  Site Confirmation: Jennifer Morse was asked to confirm the procedure and laterality before marking the  site Procedure checklist: Completed Consent: Before the procedure and under the influence of no sedative(s), amnesic(s), or anxiolytics, the patient was informed of the treatment options, risks and possible complications. To fulfill  our ethical and legal obligations, as recommended by the American Medical Association's Code of Ethics, I have informed the patient of my clinical impression; the nature and purpose of the treatment or procedure; the risks, benefits, and possible complications of the intervention; the alternatives, including doing nothing; the risk(s) and benefit(s) of the alternative treatment(s) or procedure(s); and the risk(s) and benefit(s) of doing nothing. The patient was provided information about the general risks and possible complications associated with the procedure. These may include, but are not limited to: failure to achieve desired goals, infection, bleeding, organ or nerve damage, allergic reactions, paralysis, and death. In addition, the patient was informed of those risks and complications associated to the procedure, such as failure to decrease pain; infection; bleeding; organ or nerve damage with subsequent damage to sensory, motor, and/or autonomic systems, resulting in permanent pain, numbness, and/or weakness of one or several areas of the body; allergic reactions; (i.e.: anaphylactic reaction); and/or death. Furthermore, the patient was informed of those risks and complications associated with the medications. These include, but are not limited to: allergic reactions (i.e.: anaphylactic or anaphylactoid reaction(s)); adrenal axis suppression; blood sugar elevation that in diabetics may result in ketoacidosis or comma; water retention that in patients with history of congestive heart failure may result in shortness of breath, pulmonary edema, and decompensation with resultant heart failure; weight gain; swelling or edema; medication-induced neural toxicity; particulate matter  embolism and blood vessel occlusion with resultant organ, and/or nervous system infarction; and/or aseptic necrosis of one or more joints. Finally, the patient was informed that Medicine is not an exact science; therefore, there is also the possibility of unforeseen or unpredictable risks and/or possible complications that may result in a catastrophic outcome. The patient indicated having understood very clearly. We have given the patient no guarantees and we have made no promises. Enough time was given to the patient to ask questions, all of which were answered to the patient's satisfaction. Ms. Fonseca has indicated that she wanted to continue with the procedure. Attestation: I, the ordering provider, attest that I have discussed with the patient the benefits, risks, side-effects, alternatives, likelihood of achieving goals, and potential problems during recovery for the procedure that I have provided informed consent. Date  Time: 01/02/2024  9:11 AM  Pre-Procedure Preparation:  Monitoring: As per clinic protocol. Respiration, ETCO2, SpO2, BP, heart rate and rhythm monitor placed and checked for adequate function Safety Precautions: Patient was assessed for positional comfort and pressure points before starting the procedure. Time-out: I initiated and conducted the "Time-out" before starting the procedure, as per protocol. The patient was asked to participate by confirming the accuracy of the "Time Out" information. Verification of the correct person, site, and procedure were performed and confirmed by me, the nursing staff, and the patient. "Time-out" conducted as per Joint Commission's Universal Protocol (UP.01.01.01). Time: 1117 Start Time: 1117 hrs.  Description/Narrative of Procedure:          Start Time: 1117 hrs.  Rationale (medical necessity): procedure needed and proper for the diagnosis and/or treatment of the patient's medical symptoms and needs. Procedural Technique Safety Precautions:  Aspiration looking for blood return was conducted prior to all injections. At no point did we inject any substances, as a needle was being advanced. No attempts were made at seeking any paresthesias. Safe injection practices and needle disposal techniques used. Medications properly checked for expiration dates. SDV (single dose vial) medications used. Description of the Procedure: Protocol guidelines were followed. The patient was assisted into  a comfortable position. The target area was identified and the area prepped in the usual manner. Skin & deeper tissues infiltrated with local anesthetic. Appropriate amount of time allowed to pass for local anesthetics to take effect. The procedure needles were then advanced to the target area. Proper needle placement secured. Negative aspiration confirmed. Solution injected in intermittent fashion, asking for systemic symptoms every 0.5cc of injectate. The needles were then removed and the area cleansed, making sure to leave some of the prepping solution back to take advantage of its long term bactericidal properties.  Technical description of procedure:  Fluoroscopy using a posterior anterior 45 degree angle from the midline aiming at the anterolateral aspect of the patient was used to find a direct path into the sacroiliac joint, the superior medial to posterior superior iliac spine.  The skin was marked where the desired target and the skin infiltrated with local anesthetics.  The procedure needle was then advanced until the joint was entered.  Once inside of the joint, we then proceeded to inject the desired solution.  Vitals:   01/02/24 1121 01/02/24 1133 01/02/24 1142 01/02/24 1151  BP: 118/80 110/73 100/79 110/77  Pulse:      Resp: 16 15 15 13   Temp:  (!) 97.3 F (36.3 C)  (!) 97.2 F (36.2 C)  TempSrc:  Temporal  Temporal  SpO2: 100% 99% 99% 100%  Weight:      Height:         End Time: 1125 hrs.  Imaging Guidance (Non-Spinal):          Type of  Imaging Technique: Fluoroscopy Guidance (Non-Spinal) Indication(s): Fluoroscopy guidance for needle placement to enhance accuracy in procedures requiring precise needle localization for targeted delivery of medication in or near specific anatomical locations not easily accessible without such real-time imaging assistance. Exposure Time: Please see nurses notes. Contrast: None used. Fluoroscopic Guidance: I was personally present during the use of fluoroscopy. "Tunnel Vision Technique" used to obtain the best possible view of the target area. Parallax error corrected before commencing the procedure. "Direction-depth-direction" technique used to introduce the needle under continuous pulsed fluoroscopy. Once target was reached, antero-posterior, oblique, and lateral fluoroscopic projection used confirm needle placement in all planes. Images permanently stored in EMR. Interpretation: No contrast injected. I personally interpreted the imaging intraoperatively. Adequate needle placement confirmed in multiple planes. Permanent images saved into the patient's record.  Post-operative Assessment:  Post-procedure Vital Signs:  Pulse/HCG Rate: 7066 Temp: (!) 97.2 F (36.2 C) Resp: 13 BP: 110/77 SpO2: 100 %  EBL: None  Complications: No immediate post-treatment complications observed by team, or reported by patient.  Note: The patient tolerated the entire procedure well. A repeat set of vitals were taken after the procedure and the patient was kept under observation following institutional policy, for this type of procedure. Post-procedural neurological assessment was performed, showing return to baseline, prior to discharge. The patient was provided with post-procedure discharge instructions, including a section on how to identify potential problems. Should any problems arise concerning this procedure, the patient was given instructions to immediately contact us , at any time, without hesitation. In any case,  we plan to contact the patient by telephone for a follow-up status report regarding this interventional procedure.  Comments:  No additional relevant information.  Plan of Care (POC)  Orders:  Orders Placed This Encounter  Procedures   SACROILIAC JOINT INJECTION    Scheduling Instructions:     Side: Bilateral     Sedation: With Sedation.  Timeframe: Today    Where will this procedure be performed?:   [facility]   [facility] C-ARM 1-60 MIN NO REPORT    Intraoperative interpretation by procedural physician at [facility].    Standing Status:   Standing    Number of Occurrences:   1    Reason for exam::   Assistance in needle guidance and placement for procedures requiring needle placement in or near specific anatomical locations not easily accessible without such assistance.   Informed Consent Details: Physician/Practitioner Attestation; Transcribe to consent form and obtain patient signature    Nursing Order: Transcribe to consent form and obtain patient signature. Note: Always confirm laterality of pain with Ms. Andy Keen, before procedure.    Physician/Practitioner attestation of informed consent for procedure/surgical case:   I, the physician/practitioner, attest that I have discussed with the patient the benefits, risks, side effects, alternatives, likelihood of achieving goals and potential problems during recovery for the procedure that I have provided informed consent.    Procedure:   Sacroiliac Joint Block    Physician/Practitioner performing the procedure:   Lakea Mittelman A. Barth Borne, MD    Indication/Reason:   Chronic Low Back and Hip Pain secondary to Sacroiliac Joint Pain (Arthralgia/Arthropathy)   Provide equipment / supplies at bedside    Procedure tray: "Block Tray" (Disposable  single use) Skin infiltration needle: Regular 1.5-in, 25-G, (x1) Block Needle type: Spinal Amount/quantity: 2 Size: Medium (5-inch) Gauge: 22G    Standing Status:    Standing    Number of Occurrences:   1    Specify:   Block Tray   Saline lock IV    Have LR [phone] mL available and administer at 125 mL/hr if patient becomes hypotensive.    Standing Status:   Standing    Number of Occurrences:   1   Chronic Opioid Analgesic:  No chronic opioid analgesics therapy prescribed by our practice. hydrocodone /APAP 10/325, 1 tab p.o. 4 times daily  MME/day: 40 mg/day   Medications ordered for procedure: Meds ordered this encounter  Medications   lidocaine  (XYLOCAINE ) 2 % (with pres) injection 400 mg   pentafluoroprop-tetrafluoroeth (GEBAUERS) aerosol   midazolam  (VERSED ) 5 MG/5ML injection 0.5-2 mg    Make sure Flumazenil is available in the pyxis when using this medication. If oversedation occurs, administer 0.2 mg IV over 15 sec. If after 45 sec no response, administer 0.2 mg again over 1 min; may repeat at 1 min intervals; not to exceed 4 doses (1 mg)   fentaNYL  (SUBLIMAZE ) injection 25-50 mcg    Make sure Narcan  is available in the pyxis when using this medication. In the event of respiratory depression (RR< 8/min): Titrate NARCAN  (naloxone ) in increments of 0.1 to 0.2 mg IV at 2-3 minute intervals, until desired degree of reversal.   ropivacaine  (PF) 2 mg/mL (0.2%) (NAROPIN ) injection 9 mL   methylPREDNISolone  acetate (DEPO-MEDROL ) injection 80 mg   Medications administered: We administered lidocaine , pentafluoroprop-tetrafluoroeth, midazolam , fentaNYL , ropivacaine  (PF) 2 mg/mL (0.2%), and methylPREDNISolone  acetate.  See the medical record for exact dosing, route, and time of administration.  Follow-up plan:   Return in about 2 weeks (around 01/16/2024) for (Face2F), (PPE).       Interventional Therapies  Risk Factors  Considerations  Medical Comorbidities:     Planned  Pending:   Diagnostic bilateral SI joint block #1    Under consideration:   Diagnostic/therapeutic lumbar facet MBB #2  Diagnostic/therapeutic right IA hip inj. #1   Diagnostic/therapeutic L3-4 LESI #  1  Diagnostic/therapeutic L5-S1 LESI #1  Diagnostic/therapeutic L5 TFESI #1    Completed:   Diagnostic/therapeutic lumbar facet MBB x1 (11/28/2023) (75/75/80/R:80L:0)    Therapeutic  Palliative (PRN) options:   None established   Completed by other providers:   Diagnostic/therapeutic left interlaminar L5-S1 LESI x2 (05/28/2020) by Reta Cassis, MD [facility])       Recent Visits Date Type Provider Dept  12/14/23 Office Visit Renaldo Caroli, MD [facility]  11/28/23 Procedure visit Renaldo Caroli, MD [facility]  11/15/23 Office Visit Renaldo Caroli, MD [facility]  Showing recent visits within past 90 days and meeting all other requirements Today's Visits Date Type Provider Dept  01/02/24 Procedure visit Renaldo Caroli, MD [facility]  Showing today's visits and meeting all other requirements Future Appointments Date Type Provider Dept  01/18/24 Appointment Renaldo Caroli, MD [facility]  Showing future appointments within next 90 days and meeting all other requirements  Disposition: Discharge home  Discharge (Date  Time): 01/02/2024; 1152 hrs.   Primary Care Physician: Morse, Jennifer T, NP Location: [facility] Note by: Jennifer Chafe, MD (TTS technology used. I apologize for any typographical errors that were not detected and corrected.) Date: 01/02/2024; Time: 12:01 PM  Disclaimer:  Medicine is not an Visual merchandiser. The only guarantee in medicine is that nothing is guaranteed. It is important to note that the decision to proceed with this intervention was based on the information collected from the patient. The Data and conclusions were drawn from the patient's questionnaire, the interview, and the physical examination. Because the information was provided in large part by the patient, it cannot be guaranteed that it has not  been purposely or unconsciously manipulated. Every effort has been made to obtain as much relevant data as possible for this evaluation. It is important to note that the conclusions that lead to this procedure are derived in large part from the available data. Always take into account that the treatment will also be dependent on availability of resources and existing treatment guidelines, considered by other Pain Management Practitioners as being common knowledge and practice, at the time of the intervention. For Medico-Legal purposes, it is also important to point out that variation in procedural techniques and pharmacological choices are the acceptable norm. The indications, contraindications, technique, and results of the above procedure should only be interpreted and judged by a Board-Certified Interventional Pain Specialist with extensive familiarity and expertise in the same exact procedure and technique.

## 2024-01-02 NOTE — Progress Notes (Signed)
 Safety precautions to be maintained throughout the outpatient stay will include: orient to surroundings, keep bed in low position, maintain call bell within reach at all times, provide assistance with transfer out of bed and ambulation.

## 2024-01-02 NOTE — Patient Instructions (Signed)

## 2024-01-09 ENCOUNTER — Other Ambulatory Visit: Payer: Self-pay

## 2024-01-09 DIAGNOSIS — Z6832 Body mass index (BMI) 32.0-32.9, adult: Secondary | ICD-10-CM | POA: Diagnosis not present

## 2024-01-09 DIAGNOSIS — E66811 Obesity, class 1: Secondary | ICD-10-CM | POA: Diagnosis not present

## 2024-01-09 MED ORDER — PHENTERMINE HCL 37.5 MG PO TABS
37.5000 mg | ORAL_TABLET | Freq: Every morning | ORAL | 0 refills | Status: DC
Start: 2024-01-09 — End: 2024-02-13
  Filled 2024-01-09: qty 30, 30d supply, fill #0

## 2024-01-15 DIAGNOSIS — E66811 Obesity, class 1: Secondary | ICD-10-CM | POA: Diagnosis not present

## 2024-01-17 ENCOUNTER — Ambulatory Visit: Attending: Pain Medicine | Admitting: Pain Medicine

## 2024-01-17 ENCOUNTER — Encounter: Payer: Self-pay | Admitting: Pain Medicine

## 2024-01-17 VITALS — BP 118/81 | HR 81 | Temp 98.0°F | Resp 18 | Ht 68.0 in | Wt 208.0 lb

## 2024-01-17 DIAGNOSIS — M5459 Other low back pain: Secondary | ICD-10-CM | POA: Diagnosis not present

## 2024-01-17 DIAGNOSIS — M533 Sacrococcygeal disorders, not elsewhere classified: Secondary | ICD-10-CM | POA: Diagnosis not present

## 2024-01-17 DIAGNOSIS — M545 Low back pain, unspecified: Secondary | ICD-10-CM | POA: Diagnosis not present

## 2024-01-17 DIAGNOSIS — Z09 Encounter for follow-up examination after completed treatment for conditions other than malignant neoplasm: Secondary | ICD-10-CM | POA: Diagnosis not present

## 2024-01-17 DIAGNOSIS — G8929 Other chronic pain: Secondary | ICD-10-CM | POA: Diagnosis not present

## 2024-01-17 NOTE — Patient Instructions (Signed)

## 2024-01-17 NOTE — Progress Notes (Signed)
 Safety precautions to be maintained throughout the outpatient stay will include: orient to surroundings, keep bed in low position, maintain call bell within reach at all times, provide assistance with transfer out of bed and ambulation.

## 2024-01-17 NOTE — Progress Notes (Signed)
 PROVIDER NOTE: Interpretation of information contained herein should be left to medically-trained personnel. Specific patient instructions are provided elsewhere under "Patient Instructions" section of medical record. This document was created in part using AI and STT-dictation technology, any transcriptional errors that may result from this process are unintentional.  Patient: Jennifer Morse  Service: E/M   PCP: Lemar Pyles, NP  DOB: 01/03/1991  DOS: 01/17/2024  Provider: Candi Chafe, MD  MRN: [id number]  Delivery: Face-to-face  Specialty: Interventional Pain Management  Type: Established Patient  Setting: Ambulatory outpatient facility  Specialty designation: 09  Referring Prov.: Lemar Pyles, NP  Location: Outpatient office facility       HPI  Jennifer Morse, a 33 y.o. year old female, is here today because of her Chronic bilateral low back pain without sciatica [M54.50, G89.29]. Jennifer Morse primary complain today is Back Pain (L>R)  Pertinent problems: Jennifer Morse has Migraine; Acute on chronic low back pain (Bilateral) (L>R); Chronic pain syndrome; Abnormal MRI, lumbar spine (11/06/2023); Lumbosacral intervertebral disc herniation (L5-S1); Lumbar disc herniation (L3-4); Lumbar intervertebral disc protrusion (L3-4); Lumbar facet hypertrophy; Chronic low back pain (1ry area of Pain) (Bilateral) (L>R) w/o sciatica; Vertebrogenic low back pain; Low back pain of over 3 months duration; Mechanical low back pain; Multifactorial low back pain; Lumbar facet joint pain (Bilateral) (R>L); Chronic hip pain (2ry area of Pain) (Bilateral) (R>L); Grade 1 Lumbar Retrolisthesis of L3/L4 & L5/S1; DDD (degenerative disc disease), lumbosacral (L5-S1) w/ LBP; Impaired range of motion of hip (Right); Spondylosis without myelopathy or radiculopathy, lumbosacral region; Lumbar facet joint syndrome; Osteoarthritis of lumbar spine; Osteoarthritis of facet joint of lumbar spine; Sacroiliac joint  dysfunction of sides (Bilateral) (L>R); Chronic sacroiliac joint pain (Bilateral) (L>R); Disorder of sacroiliac joints (Bilateral) (L>R); Somatic dysfunction of sacroiliac joints (Bilateral) (L>R); and Other spondylosis, sacral and sacrococcygeal region on their pertinent problem list. Pain Assessment: Severity of Chronic pain is reported as a 5 /10. Location: Back Right, Lower, Left (L>R)/Radiates from lower back into left buttocks and above the thigh area. Onset: More than a month ago. Quality: Constant, Aching, Burning, Discomfort. Timing: Constant. Modifying factor(s): Procedure help some. Vitals:  height is 5\' 8"  (1.727 m) and weight is 208 lb (94.3 kg). Her temporal temperature is 98 F (36.7 C). Her blood pressure is 118/81 and her pulse is 81. Her respiration is 18 and oxygen saturation is 99%.  BMI: Estimated body mass index is 31.63 kg/m as calculated from the following:   Height as of this encounter: 5\' 8"  (1.727 m).   Weight as of this encounter: 208 lb (94.3 kg). Last encounter: 12/14/2023. Last procedure: 01/02/2024.  Reason for encounter: post-procedure evaluation and assessment.   (11/06/2023) LUMBAR MRI FINDINGS: Alignment:  Slight retrolisthesis is present at L3-4 and L5-S1. Vertebrae: Mixed type 1 and type 2 Modic changes at L5-S1 have progressed.  DISC LEVELS: L3-4: Disc desiccation and a central annular tear are noted.  L4-5: Disc desiccation is present. Central annular tear and mild disc bulging is present. L5-S1: A shallow central disc protrusion is slightly asymmetric to the left. Chronic loss of disc height.   IMPRESSION: 1. Progressive degenerative disc disease at L5-S1 with mixed type 1 and type 2 Modic changes. 2. Shallow central disc protrusion at L5-S1 is slightly asymmetric to the left. 3. Central annular tear and mild disc bulging at L4-5 without significant stenosis. 4. Central annular tear at L3-4 without significant stenosis.   Electronically Signed    By: Veryl Gottron  Mattern M.D.   On: 11/15/2023 07:24   (05/21/2020) LUMBAR MRI FINDINGS: Vertebrae: Chronic degenerative endplate marrow signal changes at L5-S1.  DISC LEVELS: L3-4: Mild disc desiccation and Morse-based but small posterior disc protrusion mild facet hypertrophy. L4-5: Disc desiccation. Subtle posterior annular fissure of the disc. Mild facet hypertrophy. L5-S1: Disc desiccation and disc space loss. Small to moderate Morse-based central to slightly left paracentral disc extrusion. This abuts the descending S1 nerve roots in the lateral recess.  IMPRESSION: 1. Lower lumbar disc degeneration, with small disc herniations at L3-4 and L5-S1. 2. Disc at L5-S1 abuts the descending left S1 nerve roots in the lateral recesses.  Discussed the use of AI scribe software for clinical note transcription with the patient, who gave verbal consent to proceed.  History of Present Illness   Jennifer Morse is a 33 year old female who presents for post-procedure evaluation after a diagnostic bilateral sacroiliac joint injection.  She underwent a diagnostic bilateral sacroiliac joint injection, which provided significant relief compared to previous treatments. She experienced complete pain relief for the first five days post-injection, describing her back as feeling 'great'. However, after five days, the pain began to return, though not as severe as before the injection.  Prior to this procedure, she had undergone facet joint injections which provided only 24 hours of relief before the pain returned to its previous intensity. In contrast, the recent sacroiliac joint injection resulted in a 25-30% overall improvement in her pain levels, with her current pain rated at a 5 or 6 out of 10, down from a previous high of 8 or 9.  The pain primarily affects the left side of her back and radiates down her left buttock into the upper thigh, occasionally reaching mid-thigh but never extending to the  knee. No numbness is present, but certain movements can cause a sensation of buckling. She also experiences pain when crossing her right leg over her left, which she describes as deep and different from her usual pain.  She has completed physical therapy and has been using over-the-counter medications and muscle relaxants for pain management. No numbness or weakness in the legs is reported, but pain radiates down the left buttock into the upper thigh.      Post-procedure evaluation   Procedure: Sacroiliac Joint Steroid Injection #1    Laterality: Bilateral     Level: PSIS (Posterior Superior Iliac Spine)  Target: Interarticular sacroiliac joint. Location: Medial to the postero-medial edge of iliac spine. Region: Lumbosacral-sacrococcygeal. Approach:  Inferior on left and superior on right  postero-medial percutaneous approach. Type of procedure: Percutaneous joint injection.  Imaging: Fluoroscopy-guided Non-spinal ([id number]) Anesthesia: Local anesthesia (1-2% Lidocaine ) Anxiolysis: IV Versed  4.0 mg Sedation: Moderate Sedation Fentanyl  1 mL (50 mcg) DOS: 01/02/2024  Performed by: Candi Chafe, MD  Purpose: Diagnostic/Therapeutic Indications: Sacroiliac joint pain in the lower back and hip area severe enough to impact quality of life or function. Rationale (medical necessity): procedure needed and proper for the diagnosis and/or treatment of Ms. Delap's medical symptoms and needs. 1. Chronic low back pain (1ry area of Pain) (Bilateral) (L>R) w/o sciatica   2. Chronic sacroiliac joint pain (Bilateral) (L>R)   3. 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 Somatic dysfunction of sacroiliac joints (Bilateral) (L>R)   6. Other spondylosis, sacral and sacrococcygeal region   7. Multifactorial low back pain    NAS-11 Pain score:   Pre-procedure: 6 /10  Post-procedure: 5 /10    Effectiveness:  Initial hour  after procedure: 100 %. Subsequent 4-6 hours post-procedure: 100 %. Analgesia past initial 6 hours: 25 % (100% relief for 4 days on day 5 pain returned at 50% and pain gradually returned to now at 25% relief long term). Ongoing improvement:  Analgesic: The patient indicates having attained 100% relief of the pain for the duration of the local anesthetic which in fact extended for an additional 4 days after which the pain started to slowly return where she had only 50% improvement and gradually he has gone down to a currently ongoing 25% relief of the pain. Function: Ms. Friedt reports improvement in function ROM: Ms. Prechtel reports improvement in ROM   Pharmacotherapy Assessment  Analgesic: No chronic opioid analgesics therapy prescribed by our practice. hydrocodone /APAP 10/325, 1 tab p.o. 4 times daily  MME/day: 40 mg/day   Monitoring: [place] PMP: PDMP reviewed during this encounter.       Pharmacotherapy: No side-effects or adverse reactions reported. Compliance: No problems identified. Effectiveness: Clinically acceptable.  Huston Maiers, RN  01/17/2024  2:59 PM  Sign when Signing Visit Safety precautions to be maintained throughout the outpatient stay will include: orient to surroundings, keep bed in low position, maintain call bell within reach at all times, provide assistance with transfer out of bed and ambulation.     No results found for: "CBDTHCR" No results found for: "D8THCCBX" No results found for: "D9THCCBX"  UDS:  Summary  Date Value Ref Range Status  11/15/2023 FINAL  Final    Comment:    ==================================================================== Compliance Drug Analysis, Ur ==================================================================== Test                             Result       Flag       Units  Drug Present and Declared for Prescription Verification   Bupropion                       PRESENT      EXPECTED   Hydroxybupropion               PRESENT       EXPECTED    Hydroxybupropion is an expected metabolite of bupropion .  Drug Present not Declared for Prescription Verification   Amphetamine                    899          UNEXPECTED ng/mg creat    Amphetamine is available as a schedule II prescription drug.    Desmethylcyclobenzaprine       PRESENT      UNEXPECTED    Desmethylcyclobenzaprine is an expected metabolite of    cyclobenzaprine .    Methocarbamol                   PRESENT      UNEXPECTED  Drug Absent but Declared for Prescription Verification   Gabapentin                      Not Detected UNEXPECTED   Acetaminophen                   Not Detected UNEXPECTED    Acetaminophen , as indicated in the declared medication list, is not    always detected even when used as directed.    Salicylate  Not Detected UNEXPECTED    Aspirin, as indicated in the declared medication list, is not always    detected even when used as directed.  ==================================================================== Test                      Result    Flag   Units      Ref Range   Creatinine              90               mg/dL      >=16 ==================================================================== Declared Medications:  The flagging and interpretation on this report are based on the  following declared medications.  Unexpected results may arise from  inaccuracies in the declared medications.   **Note: The testing scope of this panel includes these medications:   Bupropion  (Wellbutrin  XL)  Gabapentin  (Neurontin )   **Note: The testing scope of this panel does not include small to  moderate amounts of these reported medications:   Acetaminophen  (Excedrin)  Aspirin (Excedrin)   **Note: The testing scope of this panel does not include the  following reported medications:   Botulinum (Botox )  Budesonide  (Symbicort )  Buspirone  (Buspar )  Caffeine  (Excedrin)  Formoterol  (Symbicort )  Fremanezumab  (Ajovy )   Hydrocortisone   Levonorgestrel  (Mirena )  Lubiprostone  (Amitiza )  Rimegepant (Nurtec)  Sumatriptan  (Imitrex ) ==================================================================== For clinical consultation, please call [phone]. ====================================================================       ROS  Constitutional: Denies any fever or chills Gastrointestinal: No reported hemesis, hematochezia, vomiting, or acute GI distress Musculoskeletal: Denies any acute onset joint swelling, redness, loss of ROM, or weakness Neurological: No reported episodes of acute onset apraxia, aphasia, dysarthria, agnosia, amnesia, paralysis, loss of coordination, or loss of consciousness  Medication Review  Fremanezumab -vfrm, Rimegepant Sulfate, SUMAtriptan , aspirin-acetaminophen -caffeine , azelastine , botulinum toxin Type A , buPROPion , budesonide -formoterol , busPIRone , gabapentin , hydrocortisone , levonorgestrel , lubiprostone , and phentermine   History Review  Allergy: Ms. Alward is allergic to propranolol . Drug: Ms. Hirko  reports no history of drug use. Alcohol:  reports that she does not currently use alcohol after a past usage of about 1.0 standard drink of alcohol per week. Tobacco:  reports that she quit smoking about 8 years ago. Her smoking use included cigarettes. She has never used smokeless tobacco. Social: Ms. Montierth  reports that she quit smoking about 8 years ago. Her smoking use included cigarettes. She has never used smokeless tobacco. She reports that she does not currently use alcohol after a past usage of about 1.0 standard drink of alcohol per week. She reports that she does not use drugs. Medical:  has a past medical history of Anxiety, BRCA negative (07/2021), Degenerative lumbar disc, Frequent headaches, Gestational thrombocytopenia (HCC), Lumbar herniated disc, Migraine with aura, intractable, with status migrainosus (1995), Oligomenorrhea, and Postpartum  depression. Surgical: Ms. Rielly  has a past surgical history that includes Wisdom tooth extraction (2012). Family: family history includes Diabetes in her maternal aunt, maternal grandfather, maternal grandmother, mother, and sister; Heart disease in her maternal grandfather and maternal grandmother; Hypertension in her maternal grandfather and maternal grandmother; Kidney cancer in her maternal grandmother; Migraines in her child, child, maternal grandfather, and mother; Ovarian cancer in her mother.  Laboratory Chemistry Profile   Renal Lab Results  Component Value Date   BUN 15 09/13/2023   CREATININE 0.82 09/13/2023   BCR 18 09/13/2023   GFR 94.11 08/19/2020   GFRAA 120 03/02/2018   GFRNONAA >60 11/21/2021    Hepatic Lab Results  Component Value Date  AST 19 09/13/2023   ALT 25 09/13/2023   ALBUMIN 4.4 09/13/2023   ALKPHOS 86 09/13/2023   LIPASE 34 11/21/2021    Electrolytes Lab Results  Component Value Date   NA 141 09/13/2023   K 3.8 09/13/2023   CL 102 09/13/2023   CALCIUM 9.6 09/13/2023   MG 2.0 11/15/2023    Bone Lab Results  Component Value Date   VD25OH 30.7 10/19/2022   25OHVITD1 28 (L) 11/15/2023   25OHVITD2 <1.0 11/15/2023   25OHVITD3 28 11/15/2023   TESTOFREE 0.7 11/27/2020   TESTOSTERONE  16 11/27/2020    Inflammation (CRP: Acute Phase) (ESR: Chronic Phase) Lab Results  Component Value Date   CRP 1 11/15/2023   ESRSEDRATE 10 11/15/2023         Note: Above Lab results reviewed.  Recent Imaging Review  DG PAIN CLINIC C-ARM 1-60 MIN NO REPORT Fluoro was used, but no Radiologist interpretation will be provided.  Please refer to "NOTES" tab for provider progress note. Note: Reviewed         Physical Exam  General appearance: Well nourished, well developed, and well hydrated. In no apparent acute distress Mental status: Alert, oriented x 3 (person, place, & time)       Respiratory: No evidence of acute respiratory distress Eyes:  PERLA Vitals: BP 118/81 (Patient Position: Sitting, Cuff Size: Normal)   Pulse 81   Temp 98 F (36.7 C) (Temporal)   Resp 18   Ht 5\' 8"  (1.727 m)   Wt 208 lb (94.3 kg)   SpO2 99%   BMI 31.63 kg/m  BMI: Estimated body mass index is 31.63 kg/m as calculated from the following:   Height as of this encounter: 5\' 8"  (1.727 m).   Weight as of this encounter: 208 lb (94.3 kg). Ideal: Ideal body weight: 63.9 kg (140 lb 14 oz) Adjusted ideal body weight: 76.1 kg (167 lb 11.6 oz)  Assessment   Diagnosis Status  1. Chronic low back pain (1ry area of Pain) (Bilateral) (L>R) w/o sciatica   2. Chronic sacroiliac joint pain (Bilateral) (L>R)   3. Multifactorial low back pain   4. Lumbar facet joint pain (Bilateral) (R>L)   5. Postop check    Controlled Controlled Controlled   Updated Problems: No problems updated.  Plan of Care  Problem-specific:  Assessment and Plan    Sacroiliac joint pain   Sacroiliac joint pain has significantly improved following recent bilateral sacroiliac joint injection. She experienced complete pain relief for five days post-injection, with gradual return of pain primarily on the left side, radiating down the left buttock into the upper thigh, occasionally reaching mid-thigh. The response to the injection suggests a significant component of the pain is related to the sacroiliac joint. Differential diagnosis includes potential mechanical issues versus inflammation. Repeat sacroiliac joint injection with sedation to assess for longer-lasting relief, suggesting an inflammatory component. Consider radiofrequency ablation if the second injection provides similar relief without extended duration, indicating a mechanical issue. Ensure she has a driver for the procedure and advise her to fast for 6-8 hours prior to the procedure.  Facet joint pain   Facet joint pain is considered a lesser component of her overall pain. Previous facet joint injections provided minimal  relief, suggesting the sacroiliac joint is the primary source of pain. Focus on the sacroiliac joint as the primary target for intervention. Evaluate facet joints further if pain persists after addressing the sacroiliac joint.      Ms. DIAMANTE SMALLS has a  current medication list which includes the following long-term medication(s): budesonide -formoterol , bupropion , gabapentin , levonorgestrel , phentermine , sumatriptan , and [DISCONTINUED] azelastine .  Pharmacotherapy (Medications Ordered): No orders of the defined types were placed in this encounter.  Orders:  Orders Placed This Encounter  Procedures   SACROILIAC JOINT INJECTION    Physical Examination Findings: Positive Sacral Thrust (Sacral Spring, Downward Pressure): (Y) Positive FABER maneuver (Patrick's): (Y) Positive SI distraction (Gapping): (Y) Positive SI compression (Approximation): (Y) Positive Thigh Thrust:  (Y) Positive Gaenslen's: (Y) Positive Sacral Sulcus Tenderness: (Y) Note: The above seem to have improved since the first diagnostic injection    Standing Status:   Future    Expiration Date:   04/18/2024    Scheduling Instructions:     Procedure: Sacroiliac Joint Injection     Side  Laterality: Bilateral     Sedation: With Sedation.     Timeframe: As soon as schedule allows.    Where will this procedure be performed?:   [facility]   Nursing Instructions:    Please complete this patient's postprocedure evaluation.    Scheduling Instructions:     Please complete this patient's postprocedure evaluation.   Follow-up plan:   Return for (ECT): (B) SI BLK #2.     Interventional Therapies  Risk Factors  Considerations  Medical Comorbidities:     Planned  Pending:   Diagnostic bilateral SI joint block #2    Under consideration:   Diagnostic/therapeutic lumbar facet MBB #2  Diagnostic/therapeutic right IA hip inj. #1  Diagnostic/therapeutic L3-4 LESI #1  Diagnostic/therapeutic L5-S1 LESI #1   Diagnostic/therapeutic L5 TFESI #1    Completed:   Agnostic/therapeutic bilateral SI joint Blk x1 (01/02/2024) (6-5/10) (100/100/100/50-25)  Diagnostic/therapeutic lumbar facet MBB x1 (11/28/2023) (7-5/10) (75/75/80/R:80L:0)    Therapeutic  Palliative (PRN) options:   None established   Completed by other providers:   Diagnostic/therapeutic left interlaminar L5-S1 LESI x2 (05/28/2020) by Reta Cassis, MD [facility])      Recent Visits Date Type Provider Dept  01/02/24 Procedure visit Renaldo Caroli, MD [facility]  12/14/23 Office Visit Renaldo Caroli, MD [facility]  11/28/23 Procedure visit Renaldo Caroli, MD [facility]  11/15/23 Office Visit Renaldo Caroli, MD [facility]  Showing recent visits within past 90 days and meeting all other requirements Today's Visits Date Type Provider Dept  01/17/24 Office Visit Renaldo Caroli, MD [facility]  Showing today's visits and meeting all other requirements Future Appointments Date Type Provider Dept  01/25/24 Appointment Renaldo Caroli, MD [facility]  Showing future appointments within next 90 days and meeting all other requirements   I discussed the assessment and treatment plan with the patient. The patient was provided an opportunity to ask questions and all were answered. The patient agreed with the plan and demonstrated an understanding of the instructions.  Patient advised to call back or seek an in-person evaluation if the symptoms or condition worsens.  Duration of encounter: 30 minutes.  Total time on encounter, as per AMA guidelines included both the face-to-face and non-face-to-face time personally spent by the physician and/or other qualified health care professional(s) on the day of the encounter (includes time in activities that require the physician or other qualified health care professional and does not include time in  activities normally performed by clinical staff). Physician's time may include the following activities when performed: Preparing to see the patient (e.g., pre-charting review of records, searching for previously ordered imaging, lab work, and nerve conduction tests) Review  of prior analgesic pharmacotherapies. Reviewing PMP Interpreting ordered tests (e.g., lab work, imaging, nerve conduction tests) Performing post-procedure evaluations, including interpretation of diagnostic procedures Obtaining and/or reviewing separately obtained history Performing a medically appropriate examination and/or evaluation Counseling and educating the patient/family/caregiver Ordering medications, tests, or procedures Referring and communicating with other health care professionals (when not separately reported) Documenting clinical information in the electronic or other health record Independently interpreting results (not separately reported) and communicating results to the patient/ family/caregiver Care coordination (not separately reported)  Note by: Candi Chafe, MD (TTS and AI technology used. I apologize for any typographical errors that were not detected and corrected.) Date: 01/17/2024; Time: 3:48 PM

## 2024-01-18 ENCOUNTER — Ambulatory Visit: Admitting: Pain Medicine

## 2024-01-19 ENCOUNTER — Other Ambulatory Visit: Payer: Self-pay

## 2024-01-19 NOTE — Progress Notes (Signed)
 Specialty Pharmacy Refill Coordination Note  Jennifer Morse is a 33 y.o. female contacted today regarding refills of specialty medication(s) OnabotulinumtoxinA  (BOTOX )   Patient requested (Patient-Rptd) Delivery   Delivery date: 01/25/24   Verified address: (Patient-Rptd) [address] [place], [address]   Medication will be filled on 01/24/24.

## 2024-01-25 ENCOUNTER — Ambulatory Visit: Admitting: Pain Medicine

## 2024-01-30 ENCOUNTER — Ambulatory Visit: Payer: 59 | Admitting: Adult Health

## 2024-01-30 ENCOUNTER — Encounter: Payer: Self-pay | Admitting: Neurology

## 2024-01-30 ENCOUNTER — Ambulatory Visit (INDEPENDENT_AMBULATORY_CARE_PROVIDER_SITE_OTHER): Admitting: Neurology

## 2024-01-30 VITALS — BP 111/75 | HR 76 | Ht 68.0 in | Wt 207.0 lb

## 2024-01-30 DIAGNOSIS — G43711 Chronic migraine without aura, intractable, with status migrainosus: Secondary | ICD-10-CM | POA: Diagnosis not present

## 2024-01-30 MED ORDER — ONABOTULINUMTOXINA 200 UNITS IJ SOLR
155.0000 [IU] | Freq: Once | INTRAMUSCULAR | Status: AC
  Administered 2024-01-30: 155 [IU] via INTRAMUSCULAR

## 2024-01-30 NOTE — Progress Notes (Signed)
 Botox - 200 units x 1 vial Lot: [id number] Expiration: 2027/10 NDC: 0023-3921-02  Bacteriostatic 0.9% Sodium Chloride - 4 mL  Lot: [id number] Expiration: 07/06/24 NDC: 1191478295  Dx:  A21.308   S/P  Witnessed by SYDNEY EXTERN AND Shona Downs RN

## 2024-01-30 NOTE — Progress Notes (Signed)
   BOTOX  PROCEDURE NOTE FOR MIGRAINE HEADACHE  HISTORY: Jennifer Morse is here for Botox .  Her last Botox  was 11/02/2023 with Camilo Cella. Botox  did great for 1.5 months, could then tell it was wearing off. For the 1st 1.5 month, no severe migraines, only mild headache. When it wore off 1-2 migraines a week. Remains on Ajovy . She tried sample of Nurtec, not sure it was helpful. Most effective is Imitrex  and Excedrin Migraine, takes 45 minutes to improve, has to lay in dark room. Works as Scientist, water quality.   Description of procedure:  The patient was placed in a sitting position. The standard protocol was used for Botox  as follows, with 5 units of Botox  injected at each site:  -Procerus muscle, midline injection  -Corrugator muscle, bilateral injection  -Frontalis muscle, bilateral injection, with 2 sites each side, medial injection was performed in the upper one third of the frontalis muscle, in the region vertical from the medial inferior edge of the superior orbital rim. The lateral injection was again in the upper one third of the forehead vertically above the lateral limbus of the cornea, 1.5 cm lateral to the medial injection site.  -Temporalis muscle injection, 4 sites, bilaterally. The first injection was 3 cm above the tragus of the ear, second injection site was 1.5 cm to 3 cm up from the first injection site in line with the tragus of the ear. The third injection site was 1.5-3 cm forward between the first 2 injection sites. The fourth injection site was 1.5 cm posterior to the second injection site.  -Occipitalis muscle injection, 3 sites, bilaterally. The first injection was done one half way between the occipital protuberance and the tip of the mastoid process behind the ear. The second injection site was done lateral and superior to the first, 1 fingerbreadth from the first injection. The third injection site was 1 fingerbreadth superiorly and medially from the first injection  site.  -Cervical paraspinal muscle injection, 2 sites, bilateral, the first injection site was 1 cm from the midline of the cervical spine, 3 cm inferior to the lower border of the occipital protuberance. The second injection site was 1.5 cm superiorly and laterally to the first injection site.  -Trapezius muscle injection was performed at 3 sites, bilaterally. The first injection site was in the upper trapezius muscle halfway between the inflection point of the neck, and the acromion. The second injection site was one half way between the acromion and the first injection site. The third injection was done between the first injection site and the inflection point of the neck.   A 200 unit bottle of Botox  was used, 155 units were injected, the rest of the Botox  was wasted. The patient tolerated the procedure well, there were no complications of the above procedure.  Botox  NDC 4742-5956-38 Lot number [id number] Expiration date 06/2026 SP

## 2024-01-31 NOTE — Patient Instructions (Incomplete)
 Moderate Conscious Sedation, Adult, Care After After the procedure, it is common to have: Sleepiness for a few hours. Impaired judgment for a few hours. Trouble with balance. Nausea or vomiting if you eat too soon. Follow these instructions at home: For the time period you were told by your health care provider:  Rest. Do not participate in activities where you could fall or become injured. Do not drive or use machinery. Do not drink alcohol. Do not take sleeping pills or medicines that cause drowsiness. Do not make important decisions or sign legal documents. Do not take care of children on your own. Eating and drinking Follow instructions from your health care provider about what you may eat and drink. Drink enough fluid to keep your urine pale yellow. If you vomit: Drink clear fluids slowly and in small amounts as you are able. Clear fluids include water, ice chips, low-calorie sports drinks, and fruit juice that has water added to it (diluted fruit juice). Eat light and bland foods in small amounts as you are able. These foods include bananas, applesauce, rice, lean meats, toast, and crackers. General instructions Take over-the-counter and prescription medicines only as told by your health care provider. Have a responsible adult stay with you for the time you are told. Do not use any products that contain nicotine or tobacco. These products include cigarettes, chewing tobacco, and vaping devices, such as e-cigarettes. If you need help quitting, ask your health care provider. Return to your normal activities as told by your health care provider. Ask your health care provider what activities are safe for you. Your health care provider may give you more instructions. Make sure you know what you can and cannot do. Contact a health care provider if: You are still sleepy or having trouble with balance after 24 hours. You feel light-headed. You vomit every time you eat or drink. You get  a rash. You have a fever. You have redness or swelling around the IV site. Get help right away if: You have trouble breathing. You start to feel confused at home. These symptoms may be an emergency. Get help right away. Call 911. Do not wait to see if the symptoms will go away. Do not drive yourself to the hospital. This information is not intended to replace advice given to you by your health care provider. Make sure you discuss any questions you have with your health care provider. Document Revised: 03/07/2022 Document Reviewed: 03/07/2022 Elsevier Patient Education  2024 [employer]   ______________________________________________________________________    Post-Procedure Discharge Instructions  Instructions: Apply ice:  Purpose: This will minimize any swelling and discomfort after procedure.  When: Day of procedure, as soon as you get home. How: Fill a plastic sandwich bag with crushed ice. Cover it with a small towel and apply to injection site. How long: (15 min on, 15 min off) Apply for 15 minutes then remove x 15 minutes.  Repeat sequence on day of procedure, until you go to bed. Apply heat:  Purpose: To treat any soreness and discomfort from the procedure. When: Starting the next day after the procedure. How: Apply heat to procedure site starting the day following the procedure. How long: May continue to repeat daily, until discomfort goes away. Food intake: Start with clear liquids (like water) and advance to regular food, as tolerated.  Physical activities: Keep activities to a minimum for the first 8 hours after the procedure. After that, then as tolerated. Driving: If you have received any sedation, be responsible  and do not drive. You are not allowed to drive for 24 hours after having sedation. Blood thinner: (Applies only to those taking blood thinners) You may restart your blood thinner 6 hours after your procedure. Insulin: (Applies only to Diabetic patients  taking insulin) As soon as you can eat, you may resume your normal dosing schedule. Infection prevention: Keep procedure site clean and dry. Shower daily and clean area with soap and water. Post-procedure Pain Diary: Extremely important that this be done correctly and accurately. Recorded information will be used to determine the next step in treatment. For the purpose of accuracy, follow these rules: Evaluate only the area treated. Do not report or include pain from an untreated area. For the purpose of this evaluation, ignore all other areas of pain, except for the treated area. After your procedure, avoid taking a long nap and attempting to complete the pain diary after you wake up. Instead, set your alarm clock to go off every hour, on the hour, for the initial 8 hours after the procedure. Document the duration of the numbing medicine, and the relief you are getting from it. Do not go to sleep and attempt to complete it later. It will not be accurate. If you received sedation, it is likely that you were given a medication that may cause amnesia. Because of this, completing the diary at a later time may cause the information to be inaccurate. This information is needed to plan your care. Follow-up appointment: Keep your post-procedure follow-up evaluation appointment after the procedure (usually 2 weeks for most procedures, 6 weeks for radiofrequencies). DO NOT FORGET to bring you pain diary with you.   Expect: (What should I expect to see with my procedure?) From numbing medicine (AKA: Local Anesthetics): Numbness or decrease in pain. You may also experience some weakness, which if present, could last for the duration of the local anesthetic. Onset: Full effect within 15 minutes of injected. Duration: It will depend on the type of local anesthetic used. On the average, 1 to 8 hours.  From steroids (Applies only if steroids were used): Decrease in swelling or inflammation. Once inflammation is  improved, relief of the pain will follow. Onset of benefits: Depends on the amount of swelling present. The more swelling, the longer it will take for the benefits to be seen. In some cases, up to 10 days. Duration: Steroids will stay in the system x 2 weeks. Duration of benefits will depend on multiple posibilities including persistent irritating factors. Side-effects: If present, they may typically last 2 weeks (the duration of the steroids). Frequent: Cramps (if they occur, drink Gatorade and take over-the-counter Magnesium  450-500 mg once to twice a day); water retention with temporary weight gain; increases in blood sugar; decreased immune system response; increased appetite. Occasional: Facial flushing (red, warm cheeks); mood swings; menstrual changes. Uncommon: Long-term decrease or suppression of natural hormones; bone thinning. (These are more common with higher doses or more frequent use. This is why we prefer that our patients avoid having any injection therapies in other practices.)  Very Rare: Severe mood changes; psychosis; aseptic necrosis. From procedure: Some discomfort is to be expected once the numbing medicine wears off. This should be minimal if ice and heat are applied as instructed.  Call if: (When should I call?) You experience numbness and weakness that gets worse with time, as opposed to wearing off. New onset bowel or bladder incontinence. (Applies only to procedures done in the spine)  Emergency Numbers: Durning business hours (  Monday - Thursday, 8:00 AM - 4:00 PM) (Friday, 9:00 AM - 12:00 Noon): [phone] After hours: [phone] NOTE: If you are having a problem and are unable connect with, or to talk to a provider, then go to your nearest urgent care or emergency department. If the problem is serious and urgent, please call 911. ______________________________________________________________________    Moderate Conscious Sedation, Adult, Care After After  the procedure, it is common to have: Sleepiness for a few hours. Impaired judgment for a few hours. Trouble with balance. Nausea or vomiting if you eat too soon. Follow these instructions at home: For the time period you were told by your health care provider:  Rest. Do not participate in activities where you could fall or become injured. Do not drive or use machinery. Do not drink alcohol. Do not take sleeping pills or medicines that cause drowsiness. Do not make important decisions or sign legal documents. Do not take care of children on your own. Eating and drinking Follow instructions from your health care provider about what you may eat and drink. Drink enough fluid to keep your urine pale yellow. If you vomit: Drink clear fluids slowly and in small amounts as you are able. Clear fluids include water, ice chips, low-calorie sports drinks, and fruit juice that has water added to it (diluted fruit juice). Eat light and bland foods in small amounts as you are able. These foods include bananas, applesauce, rice, lean meats, toast, and crackers. General instructions Take over-the-counter and prescription medicines only as told by your health care provider. Have a responsible adult stay with you for the time you are told. Do not use any products that contain nicotine or tobacco. These products include cigarettes, chewing tobacco, and vaping devices, such as e-cigarettes. If you need help quitting, ask your health care provider. Return to your normal activities as told by your health care provider. Ask your health care provider what activities are safe for you. Your health care provider may give you more instructions. Make sure you know what you can and cannot do. Contact a health care provider if: You are still sleepy or having trouble with balance after 24 hours. You feel light-headed. You vomit every time you eat or drink. You get a rash. You have a fever. You have redness or  swelling around the IV site. Get help right away if: You have trouble breathing. You start to feel confused at home. These symptoms may be an emergency. Get help right away. Call 911. Do not wait to see if the symptoms will go away. Do not drive yourself to the hospital. This information is not intended to replace advice given to you by your health care provider. Make sure you discuss any questions you have with your health care provider. Document Revised: 03/07/2022 Document Reviewed: 03/07/2022 Elsevier Patient Education  2024 [employer].

## 2024-01-31 NOTE — Progress Notes (Unsigned)
 PROVIDER NOTE: Interpretation of information contained herein should be left to medically-trained personnel. Specific patient instructions are provided elsewhere under "Patient Instructions" section of medical record. This document was created in part using STT-dictation technology, any transcriptional errors that may result from this process are unintentional.  Patient: Jennifer Morse Type: Established DOB: 09/09/1990 MRN: [id number] PCP: Lemar Pyles, NP  Service: Procedure DOS: 02/01/2024 Setting: Ambulatory Location: Ambulatory outpatient facility Delivery: Face-to-face Provider: Candi Chafe, MD Specialty: Interventional Pain Management Specialty designation: 09 Location: Outpatient facility Ref. Prov.: Cannady, Jolene T, NP       Interventional Therapy   Procedure: Sacroiliac Joint Steroid Injection #2    Laterality: Bilateral     Level: PSIS (Posterior Superior Iliac Spine)  Target: Interarticular sacroiliac joint. Location: Medial to the postero-medial edge of iliac spine. Region: Lumbosacral-sacrococcygeal. Approach: Inferior on left and superior on right postero-medial percutaneous approach. Type of procedure: Percutaneous joint injection.  Imaging: Fluoroscopy-guided Non-spinal ([id number]) Anesthesia: Local anesthesia (1-2% Lidocaine ) Anxiolysis: IV Versed          Sedation: Moderate Sedation                       DOS: 02/01/2024  Performed by: Candi Chafe, MD  Purpose: Diagnostic/Therapeutic Indications: Sacroiliac joint pain in the lower back and hip area severe enough to impact quality of life or function. Rationale (medical necessity): procedure needed and proper for the diagnosis and/or treatment of Jennifer Morse's medical symptoms and needs. 1. Chronic hip pain (2ry area of Pain) (Bilateral) (R>L)   2. Chronic sacroiliac joint pain (Bilateral) (L>R)   3. 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 Low back pain of over 3 months  duration   5. Other spondylosis, sacral and sacrococcygeal region   6. 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 Somatic dysfunction of sacroiliac joints (Bilateral) (L>R)    NAS-11 Pain score:   Pre-procedure: 4 /10   Post-procedure: 0-No pain/10      Position / Prep / Materials:  Position: Prone  Prep solution: ChloraPrep (2% chlorhexidine gluconate and 70% isopropyl alcohol) Prep Area: Entire posterior lumbosacral area  Materials:  Tray: Block Needle(s):  Type: Spinal  Gauge (G): 22  Length: 5-in Qty: One (1) per procedure side.  H&P (Pre-op Assessment):  Jennifer Morse is a 33 y.o. (year old), female patient, seen today for interventional treatment. She  has a past surgical history that includes Wisdom tooth extraction (2012). Jennifer Morse has a current medication list which includes the following prescription(s): aspirin-acetaminophen -caffeine , botulinum toxin type a , budesonide -formoterol , bupropion , buspirone , ajovy , gabapentin , hydrocortisone , levonorgestrel , lubiprostone , phentermine , nurtec, sumatriptan , and [DISCONTINUED] azelastine , and the following Facility-Administered Medications: fentanyl , fremanezumab -vfrm, lidocaine , methylprednisolone  acetate, midazolam , pentafluoroprop-tetrafluoroeth, and ropivacaine  (pf) 2 mg/ml (0.2%). Her primarily concern today is the Back Pain  Initial Vital Signs:  Pulse/HCG Rate: 81ECG Heart Rate: 73 Temp: 97.9 F (36.6 C) Resp: 16 BP: 120/72 SpO2: 100 %  BMI: Estimated body mass index is 30.41 kg/m as calculated from the following:   Height as of this encounter: 5\' 8"  (1.727 m).   Weight as of this encounter: 200 lb (90.7 kg).  Risk Assessment: Allergies: Reviewed. She is allergic to propranolol .  Allergy Precautions: None required Coagulopathies: Reviewed. None identified.  Blood-thinner therapy: None at this time Active Infection(s): Reviewed. None identified. Jennifer Morse is afebrile  Site Confirmation: Ms.  Morse was asked to confirm the procedure and laterality before marking the site Procedure checklist: Completed Consent:  Before the procedure and under the influence of no sedative(s), amnesic(s), or anxiolytics, the patient was informed of the treatment options, risks and possible complications. To fulfill our ethical and legal obligations, as recommended by the American Medical Association's Code of Ethics, I have informed the patient of my clinical impression; the nature and purpose of the treatment or procedure; the risks, benefits, and possible complications of the intervention; the alternatives, including doing nothing; the risk(s) and benefit(s) of the alternative treatment(s) or procedure(s); and the risk(s) and benefit(s) of doing nothing. The patient was provided information about the general risks and possible complications associated with the procedure. These may include, but are not limited to: failure to achieve desired goals, infection, bleeding, organ or nerve damage, allergic reactions, paralysis, and death. In addition, the patient was informed of those risks and complications associated to the procedure, such as failure to decrease pain; infection; bleeding; organ or nerve damage with subsequent damage to sensory, motor, and/or autonomic systems, resulting in permanent pain, numbness, and/or weakness of one or several areas of the body; allergic reactions; (i.e.: anaphylactic reaction); and/or death. Furthermore, the patient was informed of those risks and complications associated with the medications. These include, but are not limited to: allergic reactions (i.e.: anaphylactic or anaphylactoid reaction(s)); adrenal axis suppression; blood sugar elevation that in diabetics may result in ketoacidosis or comma; water retention that in patients with history of congestive heart failure may result in shortness of breath, pulmonary edema, and decompensation with resultant heart failure; weight  gain; swelling or edema; medication-induced neural toxicity; particulate matter embolism and blood vessel occlusion with resultant organ, and/or nervous system infarction; and/or aseptic necrosis of one or more joints. Finally, the patient was informed that Medicine is not an exact science; therefore, there is also the possibility of unforeseen or unpredictable risks and/or possible complications that may result in a catastrophic outcome. The patient indicated having understood very clearly. We have given the patient no guarantees and we have made no promises. Enough time was given to the patient to ask questions, all of which were answered to the patient's satisfaction. Ms. Weitzman has indicated that she wanted to continue with the procedure. Attestation: I, the ordering provider, attest that I have discussed with the patient the benefits, risks, side-effects, alternatives, likelihood of achieving goals, and potential problems during recovery for the procedure that I have provided informed consent. Date  Time: 02/01/2024 11:12 AM  Pre-Procedure Preparation:  Monitoring: As per clinic protocol. Respiration, ETCO2, SpO2, BP, heart rate and rhythm monitor placed and checked for adequate function Safety Precautions: Patient was assessed for positional comfort and pressure points before starting the procedure. Time-out: I initiated and conducted the "Time-out" before starting the procedure, as per protocol. The patient was asked to participate by confirming the accuracy of the "Time Out" information. Verification of the correct person, site, and procedure were performed and confirmed by me, the nursing staff, and the patient. "Time-out" conducted as per Joint Commission's Universal Protocol (UP.01.01.01). Time: 1305 Start Time: 1305 hrs.  Description/Narrative of Procedure:          Start Time: 1305 hrs.  Rationale (medical necessity): procedure needed and proper for the diagnosis and/or treatment of the  patient's medical symptoms and needs. Procedural Technique Safety Precautions: Aspiration looking for blood return was conducted prior to all injections. At no point did we inject any substances, as a needle was being advanced. No attempts were made at seeking any paresthesias. Safe injection practices and needle disposal techniques  used. Medications properly checked for expiration dates. SDV (single dose vial) medications used. Description of the Procedure: Protocol guidelines were followed. The patient was assisted into a comfortable position. The target area was identified and the area prepped in the usual manner. Skin & deeper tissues infiltrated with local anesthetic. Appropriate amount of time allowed to pass for local anesthetics to take effect. The procedure needles were then advanced to the target area. Proper needle placement secured. Negative aspiration confirmed. Solution injected in intermittent fashion, asking for systemic symptoms every 0.5cc of injectate. The needles were then removed and the area cleansed, making sure to leave some of the prepping solution back to take advantage of its long term bactericidal properties.  Technical description of procedure:  Fluoroscopy using a posterior anterior 45 degree angle from the midline aiming at the anterolateral aspect of the patient was used to find a direct path into the sacroiliac joint, the superior medial to posterior superior iliac spine.  The skin was marked where the desired target and the skin infiltrated with local anesthetics.  The procedure needle was then advanced until the joint was entered.  Once inside of the joint, we then proceeded to inject the desired solution.  Vitals:   02/01/24 1310 02/01/24 1323 02/01/24 1332 02/01/24 1337  BP: 118/72 102/76 110/76 109/80  Pulse: 73     Resp: 15 16 16 16   Temp:      SpO2: 100% 98% 98% 99%  Weight:      Height:         End Time: 1309 hrs.  Imaging Guidance (Non-Spinal):           Type of Imaging Technique: Fluoroscopy Guidance (Non-Spinal) Indication(s): Fluoroscopy guidance for needle placement to enhance accuracy in procedures requiring precise needle localization for targeted delivery of medication in or near specific anatomical locations not easily accessible without such real-time imaging assistance. Exposure Time: Please see nurses notes. Contrast: None used. Fluoroscopic Guidance: I was personally present during the use of fluoroscopy. "Tunnel Vision Technique" used to obtain the best possible view of the target area. Parallax error corrected before commencing the procedure. "Direction-depth-direction" technique used to introduce the needle under continuous pulsed fluoroscopy. Once target was reached, antero-posterior, oblique, and lateral fluoroscopic projection used confirm needle placement in all planes. Images permanently stored in EMR. Interpretation: No contrast injected. I personally interpreted the imaging intraoperatively. Adequate needle placement confirmed in multiple planes. Permanent images saved into the patient's record.  Post-operative Assessment:  Post-procedure Vital Signs:  Pulse/HCG Rate: 7385 Temp: 97.9 F (36.6 C) Resp: 16 BP: 109/80 SpO2: 99 %  EBL: None  Complications: No immediate post-treatment complications observed by team, or reported by patient.  Note: The patient tolerated the entire procedure well. A repeat set of vitals were taken after the procedure and the patient was kept under observation following institutional policy, for this type of procedure. Post-procedural neurological assessment was performed, showing return to baseline, prior to discharge. The patient was provided with post-procedure discharge instructions, including a section on how to identify potential problems. Should any problems arise concerning this procedure, the patient was given instructions to immediately contact us , at any time, without hesitation. In any  case, we plan to contact the patient by telephone for a follow-up status report regarding this interventional procedure.  Comments:  No additional relevant information.  Plan of Care (POC)  Orders:  Orders Placed This Encounter  Procedures   SACROILIAC JOINT INJECTION    Scheduling Instructions:  Side: Bilateral     Sedation: Patient's choice.     Timeframe: Today    Where will this procedure be performed?:   [facility]   DG [facility] C-ARM 1-60 MIN NO REPORT    Intraoperative interpretation by procedural physician at [facility].    Standing Status:   Standing    Number of Occurrences:   1    Reason for exam::   Assistance in needle guidance and placement for procedures requiring needle placement in or near specific anatomical locations not easily accessible without such assistance.   Informed Consent Details: Physician/Practitioner Attestation; Transcribe to consent form and obtain patient signature    Nursing Order: Transcribe to consent form and obtain patient signature. Note: Always confirm laterality of pain with Ms. Andy Keen, before procedure.    Physician/Practitioner attestation of informed consent for procedure/surgical case:   I, the physician/practitioner, attest that I have discussed with the patient the benefits, risks, side effects, alternatives, likelihood of achieving goals and potential problems during recovery for the procedure that I have provided informed consent.    Procedure:   Sacroiliac Joint Block    Physician/Practitioner performing the procedure:   Jiana Lemaire A. Barth Borne, MD    Indication/Reason:   Chronic Low Back and Hip Pain secondary to Sacroiliac Joint Pain (Arthralgia/Arthropathy)   Provide equipment / supplies at bedside    Procedure tray: "Block Tray" (Disposable  single use) Skin infiltration needle: Regular 1.5-in, 25-G, (x1) Block Needle type: Spinal Amount/quantity: 2 Size: Medium (5-inch) Gauge: 22G    Standing Status:    Standing    Number of Occurrences:   1    Specify:   Block Tray   Saline lock IV    Have LR [phone] mL available and administer at 125 mL/hr if patient becomes hypotensive.    Standing Status:   Standing    Number of Occurrences:   1    Chronic Opioid Analgesic:  No chronic opioid analgesics therapy prescribed by our practice. hydrocodone /APAP 10/325, 1 tab p.o. 4 times daily  MME/day: 40 mg/day    Medications ordered for procedure: Meds ordered this encounter  Medications   lidocaine  (XYLOCAINE ) 2 % (with pres) injection 400 mg   pentafluoroprop-tetrafluoroeth (GEBAUERS) aerosol   midazolam  (VERSED ) 5 MG/5ML injection 0.5-2 mg    Make sure Flumazenil is available in the pyxis when using this medication. If oversedation occurs, administer 0.2 mg IV over 15 sec. If after 45 sec no response, administer 0.2 mg again over 1 min; may repeat at 1 min intervals; not to exceed 4 doses (1 mg)   fentaNYL  (SUBLIMAZE ) injection 25-50 mcg    Make sure Narcan  is available in the pyxis when using this medication. In the event of respiratory depression (RR< 8/min): Titrate NARCAN  (naloxone ) in increments of 0.1 to 0.2 mg IV at 2-3 minute intervals, until desired degree of reversal.   ropivacaine  (PF) 2 mg/mL (0.2%) (NAROPIN ) injection 9 mL   methylPREDNISolone  acetate (DEPO-MEDROL ) injection 80 mg   Medications administered: Karington E. Brierley had no medications administered during this visit.  See the medical record for exact dosing, route, and time of administration.  Follow-up plan:   Return in about 2 weeks (around 02/15/2024) for (Face2F), (PPE).       Interventional Therapies  Risk Factors  Considerations  Medical Comorbidities:     Planned  Pending:   Diagnostic bilateral SI joint block #1    Under consideration:   Diagnostic/therapeutic lumbar facet MBB #2  Diagnostic/therapeutic right IA hip inj. #1  Diagnostic/therapeutic L3-4 LESI #1  Diagnostic/therapeutic L5-S1 LESI #1   Diagnostic/therapeutic L5 TFESI #1    Completed:   Diagnostic/therapeutic lumbar facet MBB x1 (11/28/2023) (75/75/80/R:80L:0)    Therapeutic  Palliative (PRN) options:   None established   Completed by other providers:   Diagnostic/therapeutic left interlaminar L5-S1 LESI x2 (05/28/2020) by Reta Cassis, MD [facility])       Recent Visits Date Type Provider Dept  01/17/24 Office Visit Renaldo Caroli, MD [facility]  01/02/24 Procedure visit Renaldo Caroli, MD [facility]  12/14/23 Office Visit Renaldo Caroli, MD [facility]  11/28/23 Procedure visit Renaldo Caroli, MD [facility]  11/15/23 Office Visit Renaldo Caroli, MD [facility]  Showing recent visits within past 90 days and meeting all other requirements Today's Visits Date Type Provider Dept  02/01/24 Procedure visit Renaldo Caroli, MD [facility]  Showing today's visits and meeting all other requirements Future Appointments Date Type Provider Dept  02/14/24 Appointment Renaldo Caroli, MD [facility]  Showing future appointments within next 90 days and meeting all other requirements  Disposition: Discharge home  Discharge (Date  Time): 02/01/2024; 1338 hrs.   Primary Care Physician: Cannady, Jolene T, NP Location: [facility] Note by: Candi Chafe, MD (TTS technology used. I apologize for any typographical errors that were not detected and corrected.) Date: 02/01/2024; Time: 1:53 PM  Disclaimer:  Medicine is not an Visual merchandiser. The only guarantee in medicine is that nothing is guaranteed. It is important to note that the decision to proceed with this intervention was based on the information collected from the patient. The Data and conclusions were drawn from the patient's questionnaire, the interview, and the physical examination. Because the information was provided in large  part by the patient, it cannot be guaranteed that it has not been purposely or unconsciously manipulated. Every effort has been made to obtain as much relevant data as possible for this evaluation. It is important to note that the conclusions that lead to this procedure are derived in large part from the available data. Always take into account that the treatment will also be dependent on availability of resources and existing treatment guidelines, considered by other Pain Management Practitioners as being common knowledge and practice, at the time of the intervention. For Medico-Legal purposes, it is also important to point out that variation in procedural techniques and pharmacological choices are the acceptable norm. The indications, contraindications, technique, and results of the above procedure should only be interpreted and judged by a Board-Certified Interventional Pain Specialist with extensive familiarity and expertise in the same exact procedure and technique.

## 2024-02-01 ENCOUNTER — Encounter: Payer: Self-pay | Admitting: Pain Medicine

## 2024-02-01 ENCOUNTER — Ambulatory Visit: Attending: Pain Medicine | Admitting: Pain Medicine

## 2024-02-01 ENCOUNTER — Ambulatory Visit
Admission: RE | Admit: 2024-02-01 | Discharge: 2024-02-01 | Disposition: A | Discharge status: Home / Self Care | Source: Ambulatory Visit | Attending: Pain Medicine | Admitting: Pain Medicine

## 2024-02-01 ENCOUNTER — Telehealth: Payer: 59 | Admitting: Neurology

## 2024-02-01 VITALS — BP 109/80 | HR 73 | Temp 97.9°F | Resp 16 | Ht 68.0 in | Wt 200.0 lb

## 2024-02-01 DIAGNOSIS — G8929 Other chronic pain: Secondary | ICD-10-CM | POA: Diagnosis not present

## 2024-02-01 DIAGNOSIS — M51372 Other intervertebral disc degeneration, lumbosacral region with discogenic back pain and lower extremity pain: Secondary | ICD-10-CM | POA: Insufficient documentation

## 2024-02-01 DIAGNOSIS — M47898 Other spondylosis, sacral and sacrococcygeal region: Secondary | ICD-10-CM | POA: Insufficient documentation

## 2024-02-01 DIAGNOSIS — M25552 Pain in left hip: Secondary | ICD-10-CM | POA: Insufficient documentation

## 2024-02-01 DIAGNOSIS — M9904 Segmental and somatic dysfunction of sacral region: Secondary | ICD-10-CM | POA: Diagnosis not present

## 2024-02-01 DIAGNOSIS — M25551 Pain in right hip: Secondary | ICD-10-CM | POA: Insufficient documentation

## 2024-02-01 DIAGNOSIS — M533 Sacrococcygeal disorders, not elsewhere classified: Secondary | ICD-10-CM | POA: Diagnosis not present

## 2024-02-01 DIAGNOSIS — M545 Low back pain, unspecified: Secondary | ICD-10-CM

## 2024-02-01 MED ORDER — ROPIVACAINE HCL 2 MG/ML IJ SOLN: 9.0000 mL | Freq: Once | INTRAMUSCULAR | Status: DC

## 2024-02-01 MED ORDER — FENTANYL CITRATE (PF) 100 MCG/2ML IJ SOLN: 25.0000 ug | INTRAMUSCULAR | Status: DC | PRN

## 2024-02-01 MED ORDER — LIDOCAINE HCL 2 % IJ SOLN: 20.0000 mL | Freq: Once | INTRAMUSCULAR | Status: DC

## 2024-02-01 MED ORDER — MIDAZOLAM HCL 5 MG/5ML IJ SOLN
INTRAMUSCULAR | Status: AC
  Filled 2024-02-01: qty 5

## 2024-02-01 MED ORDER — LIDOCAINE HCL 2 % IJ SOLN
INTRAMUSCULAR | Status: AC
  Filled 2024-02-01: qty 20

## 2024-02-01 MED ORDER — METHYLPREDNISOLONE ACETATE 80 MG/ML IJ SUSP: 80.0000 mg | Freq: Once | INTRAMUSCULAR | Status: DC

## 2024-02-01 MED ORDER — FENTANYL CITRATE (PF) 100 MCG/2ML IJ SOLN
INTRAMUSCULAR | Status: AC
  Filled 2024-02-01: qty 2

## 2024-02-01 MED ORDER — MIDAZOLAM HCL 5 MG/5ML IJ SOLN: 0.5000 mg | Freq: Once | INTRAMUSCULAR | Status: DC

## 2024-02-01 MED ORDER — PENTAFLUOROPROP-TETRAFLUOROETH EX AERO: INHALATION_SPRAY | Freq: Once | CUTANEOUS | Status: DC

## 2024-02-01 MED ORDER — METHYLPREDNISOLONE ACETATE 80 MG/ML IJ SUSP
INTRAMUSCULAR | Status: AC
  Filled 2024-02-01: qty 1

## 2024-02-01 MED ORDER — ROPIVACAINE HCL 2 MG/ML IJ SOLN
INTRAMUSCULAR | Status: AC
  Filled 2024-02-01: qty 20

## 2024-02-01 NOTE — Progress Notes (Signed)
 Safety precautions to be maintained throughout the outpatient stay will include: orient to surroundings, keep bed in low position, maintain call bell within reach at all times, provide assistance with transfer out of bed and ambulation.

## 2024-02-02 ENCOUNTER — Telehealth: Payer: Self-pay | Admitting: *Deleted

## 2024-02-02 NOTE — Telephone Encounter (Signed)
 Post procedure call; no answer/no voicemail

## 2024-02-06 ENCOUNTER — Other Ambulatory Visit: Payer: Self-pay | Admitting: Nurse Practitioner

## 2024-02-07 ENCOUNTER — Other Ambulatory Visit: Payer: Self-pay

## 2024-02-08 ENCOUNTER — Other Ambulatory Visit: Payer: Self-pay

## 2024-02-08 MED ORDER — GABAPENTIN 600 MG PO TABS
300.0000 mg | ORAL_TABLET | Freq: Every day | ORAL | 1 refills | Status: AC
  Filled 2024-02-08: qty 45, 90d supply, fill #0
  Filled 2024-06-13: qty 45, 90d supply, fill #1

## 2024-02-08 NOTE — Telephone Encounter (Signed)
 Requested Prescriptions  Pending Prescriptions Disp Refills   gabapentin  (NEURONTIN ) 600 MG tablet 45 tablet 1    Sig: Take 0.5 tablets (300 mg total) by mouth at bedtime.     Neurology: Anticonvulsants - gabapentin  Passed - 02/08/2024  8:45 AM      Passed - Cr in normal range and within 360 days    Creat  Date Value Ref Range Status  05/13/2020 1.02 0.50 - 1.10 mg/dL Final   Creatinine, Ser  Date Value Ref Range Status  09/13/2023 0.82 0.57 - 1.00 mg/dL Final         Passed - Completed PHQ-2 or PHQ-9 in the last 360 days      Passed - Valid encounter within last 12 months    Recent Outpatient Visits           3 months ago Acute on chronic low back pain   [facility] [place], Lavelle Posey, NP       Future Appointments             In 1 week Glory Larsen, MD [facility]

## 2024-02-13 ENCOUNTER — Other Ambulatory Visit: Payer: Self-pay

## 2024-02-13 MED ORDER — PHENTERMINE HCL 37.5 MG PO TABS
37.5000 mg | ORAL_TABLET | Freq: Every day | ORAL | 0 refills | Status: DC
  Filled 2024-02-13: qty 30, 30d supply, fill #0

## 2024-02-13 NOTE — Progress Notes (Unsigned)
 PROVIDER NOTE: Interpretation of information contained herein should be left to medically-trained personnel. Specific patient instructions are provided elsewhere under Patient Instructions section of medical record. This document was created in part using AI and STT-dictation technology, any transcriptional errors that may result from this process are unintentional.  Patient: Jennifer Morse  Service: E/M   PCP: Lemar Pyles, NP  DOB: 1991-02-09  DOS: 02/14/2024  Provider: Candi Chafe, MD  MRN: [id number]  Delivery: Face-to-face  Specialty: Interventional Pain Management  Type: Established Patient  Setting: Ambulatory outpatient facility  Specialty designation: 09  Referring Prov.: Lemar Pyles, NP  Location: Outpatient office facility       History of present illness (HPI) Ms. Jennifer Morse, a 33 y.o. year old female, is here today because of her Chronic hip pain, bilateral [M25.551, M25.552, G89.29]. Jennifer Morse primary complain today is Back Pain (Lower, worse on left lide)  Pertinent problems: Ms. Lebon has Migraine; Acute on chronic low back pain (Bilateral) (L>R); Chronic pain syndrome; Abnormal MRI, lumbar spine (11/06/2023); Lumbosacral intervertebral disc herniation (L5-S1); Lumbar disc herniation (L3-4); Lumbar intervertebral disc protrusion (L3-4); Lumbar facet hypertrophy; Chronic low back pain (1ry area of Pain) (Bilateral) (L>R) w/o sciatica; Vertebrogenic low back pain; Low back pain of over 3 months duration; Mechanical low back pain; Multifactorial low back pain; Lumbar facet joint pain (Bilateral) (R>L); Chronic hip pain (2ry area of Pain) (Bilateral) (R>L); Grade 1 Lumbar Retrolisthesis of L3/L4 & L5/S1; DDD (degenerative disc disease), lumbosacral (L5-S1) w/ LBP; Impaired range of motion of hip (Right); Spondylosis without myelopathy or radiculopathy, lumbosacral region; Lumbar facet joint syndrome; Osteoarthritis of lumbar spine; Osteoarthritis of facet  joint of lumbar spine; Sacroiliac joint dysfunction of sides (Bilateral) (L>R); Chronic sacroiliac joint pain (Bilateral) (L>R); Disorder of sacroiliac joints (Bilateral) (L>R); Somatic dysfunction of sacroiliac joints (Bilateral) (L>R); and Other spondylosis, sacral and sacrococcygeal region on their pertinent problem list.  Pain Assessment: Severity of Chronic pain is reported as a 3 /10. Location: Back Lower/left buttock. Onset: More than a month ago. Quality: Aching. Timing: Intermittent. Modifying factor(s): stretching. Vitals:  height is 5' 8 (1.727 m) and weight is 200 lb (90.7 kg). Her temporal temperature is 98.6 F (37 C). Her blood pressure is 107/69 and her pulse is 77. Her respiration is 16 and oxygen saturation is 100%.  BMI: Estimated body mass index is 30.41 kg/m as calculated from the following:   Height as of this encounter: 5' 8 (1.727 m).   Weight as of this encounter: 200 lb (90.7 kg).  Last encounter: 01/17/2024. Last procedure: 02/01/2024.  Reason for encounter: post-procedure evaluation and assessment.   Discussed the use of AI scribe software for clinical note transcription with the patient, who gave verbal consent to proceed.  History of Present Illness   Jennifer Morse is a 33 year old female who presents for follow-up after a second SI joint injection.  After the second sacroiliac (SI) joint injection, she experienced numbness for about a day and a half to two days, but no pain. She felt good until two or three days ago when she worked in the yard, which resulted in soreness. However, she describes the soreness as tolerable.  She has been using ibuprofen  as needed, particularly when she anticipates engaging in physical activities such as cleaning the house. She takes magnesium at a dose of 500 mg, which she understands to be a muscle relaxant.  She has previously undergone physical therapy and has tried various non-invasive  techniques, including  over-the-counter medications, to manage her symptoms. Despite these efforts, she continues to experience issues, particularly after physical exertion.     Post-Procedure Evaluation   Procedure: Sacroiliac Joint Steroid Injection #2    Laterality: Bilateral     Level: PSIS (Posterior Superior Iliac Spine)  Target: Interarticular sacroiliac joint. Location: Medial to the postero-medial edge of iliac spine. Region: Lumbosacral-sacrococcygeal. Approach: Inferior on left and superior on right postero-medial percutaneous approach. Type of procedure: Percutaneous joint injection.  Imaging: Fluoroscopy-guided Non-spinal ([id number]) Anesthesia: Local anesthesia (1-2% Lidocaine ) Anxiolysis: IV Versed          Sedation: Moderate Sedation                       DOS: 02/01/2024  Performed by: Candi Chafe, MD  Purpose: Diagnostic/Therapeutic Indications: Sacroiliac joint pain in the lower back and hip area severe enough to impact quality of life or function. Rationale (medical necessity): procedure needed and proper for the diagnosis and/or treatment of Jennifer Morse's medical symptoms and needs. 1. Chronic hip pain (2ry area of Pain) (Bilateral) (R>L)   2. Chronic sacroiliac joint pain (Bilateral) (L>R)   3. 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 Low back pain of over 3 months duration   5. Other spondylosis, sacral and sacrococcygeal region   6. 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 Somatic dysfunction of sacroiliac joints (Bilateral) (L>R)    NAS-11 Pain score:   Pre-procedure: 4 /10   Post-procedure: 0-No pain/10     Effectiveness:  Initial hour after procedure: 100 %. Subsequent 4-6 hours post-procedure: 100 %. Analgesia past initial 6 hours: 75 %. Ongoing improvement:  Analgesic: The patient indicated having attained 100% relief of the pain for the duration of local anesthetic followed by an ongoing 75% improvement. Function: Jennifer Morse  reports improvement in function ROM: Jennifer Morse reports improvement in ROM   Pharmacotherapy Assessment   Analgesic: Chronic Opioid Analgesic: No chronic opioid analgesics therapy prescribed by our practice. hydrocodone /APAP 10/325, 1 tab p.o. 4 times daily  MME/day: 40 mg/day   Monitoring: [place] PMP: PDMP reviewed during this encounter.       Pharmacotherapy: No side-effects or adverse reactions reported. Compliance: No problems identified. Effectiveness: Clinically acceptable.  No notes on file  UDS:  Summary  Date Value Ref Range Status  11/15/2023 FINAL  Final    Comment:    ==================================================================== Compliance Drug Analysis, Ur ==================================================================== Test                             Result       Flag       Units  Drug Present and Declared for Prescription Verification   Bupropion                       PRESENT      EXPECTED   Hydroxybupropion               PRESENT      EXPECTED    Hydroxybupropion is an expected metabolite of bupropion .  Drug Present not Declared for Prescription Verification   Amphetamine                    899          UNEXPECTED ng/mg creat    Amphetamine is available as a schedule II prescription drug.    Desmethylcyclobenzaprine  PRESENT      UNEXPECTED    Desmethylcyclobenzaprine is an expected metabolite of    cyclobenzaprine .    Methocarbamol                   PRESENT      UNEXPECTED  Drug Absent but Declared for Prescription Verification   Gabapentin                      Not Detected UNEXPECTED   Acetaminophen                   Not Detected UNEXPECTED    Acetaminophen , as indicated in the declared medication list, is not    always detected even when used as directed.    Salicylate                     Not Detected UNEXPECTED    Aspirin, as indicated in the declared medication list, is not always    detected even when used as  directed.  ==================================================================== Test                      Result    Flag   Units      Ref Range   Creatinine              90               mg/dL      >=16 ==================================================================== Declared Medications:  The flagging and interpretation on this report are based on the  following declared medications.  Unexpected results may arise from  inaccuracies in the declared medications.   **Note: The testing scope of this panel includes these medications:   Bupropion  (Wellbutrin  XL)  Gabapentin  (Neurontin )   **Note: The testing scope of this panel does not include small to  moderate amounts of these reported medications:   Acetaminophen  (Excedrin)  Aspirin (Excedrin)   **Note: The testing scope of this panel does not include the  following reported medications:   Botulinum (Botox )  Budesonide  (Symbicort )  Buspirone  (Buspar )  Caffeine  (Excedrin)  Formoterol  (Symbicort )  Fremanezumab  (Ajovy )  Hydrocortisone   Levonorgestrel  (Mirena )  Lubiprostone  (Amitiza )  Rimegepant (Nurtec)  Sumatriptan  (Imitrex ) ==================================================================== For clinical consultation, please call [phone]. ====================================================================     No results found for: CBDTHCR No results found for: D8THCCBX No results found for: D9THCCBX  ROS  Constitutional: Denies any fever or chills Gastrointestinal: No reported hemesis, hematochezia, vomiting, or acute GI distress Musculoskeletal: Denies any acute onset joint swelling, redness, loss of ROM, or weakness Neurological: No reported episodes of acute onset apraxia, aphasia, dysarthria, agnosia, amnesia, paralysis, loss of coordination, or loss of consciousness  Medication Review  Fremanezumab -vfrm, Rimegepant Sulfate, SUMAtriptan , aspirin-acetaminophen -caffeine , azelastine , botulinum  toxin Type A, buPROPion , budesonide -formoterol , busPIRone , gabapentin , hydrocortisone , levonorgestrel , lubiprostone , and phentermine   History Review  Allergy: Jennifer Morse is allergic to propranolol . Drug: Jennifer Morse  reports no history of drug use. Alcohol:  reports that she does not currently use alcohol after a past usage of about 1.0 standard drink of alcohol per week. Tobacco:  reports that she quit smoking about 8 years ago. Her smoking use included cigarettes. She has never used smokeless tobacco. Social: Jennifer Morse  reports that she quit smoking about 8 years ago. Her smoking use included cigarettes. She has never used smokeless tobacco. She reports that she does not currently use alcohol after a past usage of about 1.0 standard drink of alcohol per  week. She reports that she does not use drugs. Medical:  has a past medical history of Anxiety, BRCA negative (07/2021), Degenerative lumbar disc, Frequent headaches, Gestational thrombocytopenia (HCC), Lumbar herniated disc, Migraine with aura, intractable, with status migrainosus (1995), Oligomenorrhea, and Postpartum depression. Surgical: Jennifer Morse  has a past surgical history that includes Wisdom tooth extraction (2012). Family: family history includes Diabetes in her maternal aunt, maternal grandfather, maternal grandmother, mother, and sister; Heart disease in her maternal grandfather and maternal grandmother; Hypertension in her maternal grandfather and maternal grandmother; Kidney cancer in her maternal grandmother; Migraines in her child, child, maternal grandfather, and mother; Ovarian cancer in her mother.  Laboratory Chemistry Profile   Renal Lab Results  Component Value Date   BUN 15 09/13/2023   CREATININE 0.82 09/13/2023   BCR 18 09/13/2023   GFR 94.11 08/19/2020   GFRAA 120 03/02/2018   GFRNONAA >60 11/21/2021    Hepatic Lab Results  Component Value Date   AST 19 09/13/2023   ALT 25 09/13/2023   ALBUMIN 4.4  09/13/2023   ALKPHOS 86 09/13/2023   LIPASE 34 11/21/2021    Electrolytes Lab Results  Component Value Date   NA 141 09/13/2023   K 3.8 09/13/2023   CL 102 09/13/2023   CALCIUM 9.6 09/13/2023   MG 2.0 11/15/2023    Bone Lab Results  Component Value Date   VD25OH 30.7 10/19/2022   25OHVITD1 28 (L) 11/15/2023   25OHVITD2 <1.0 11/15/2023   25OHVITD3 28 11/15/2023   TESTOFREE 0.7 11/27/2020   TESTOSTERONE  16 11/27/2020    Inflammation (CRP: Acute Phase) (ESR: Chronic Phase) Lab Results  Component Value Date   CRP 1 11/15/2023   ESRSEDRATE 10 11/15/2023         Note: Above Lab results reviewed.  Recent Imaging Review  DG PAIN CLINIC C-ARM 1-60 MIN NO REPORT Fluoro was used, but no Radiologist interpretation will be provided.  Please refer to NOTES tab for provider progress note. Note: Reviewed         Physical Exam  General appearance: Well nourished, well developed, and well hydrated. In no apparent acute distress Mental status: Alert, oriented x 3 (person, place, & time)       Respiratory: No evidence of acute respiratory distress Eyes: PERLA Vitals: BP 107/69 (Cuff Size: Normal)   Pulse 77   Temp 98.6 F (37 C) (Temporal)   Resp 16   Ht 5' 8 (1.727 m)   Wt 200 lb (90.7 kg)   SpO2 100%   BMI 30.41 kg/m  BMI: Estimated body mass index is 30.41 kg/m as calculated from the following:   Height as of this encounter: 5' 8 (1.727 m).   Weight as of this encounter: 200 lb (90.7 kg). Ideal: Ideal body weight: 63.9 kg (140 lb 14 oz) Adjusted ideal body weight: 74.6 kg (164 lb 8.4 oz)  Assessment   Diagnosis Status  1. Chronic hip pain (2ry area of Pain) (Bilateral) (R>L)   2. Chronic sacroiliac joint pain (Bilateral) (L>R)   3. Low back pain of over 3 months duration   4. Chronic low back pain (1ry area of Pain) (Bilateral) (L>R) w/o sciatica   5. Multifactorial low back pain   6. Lumbar facet joint pain (Bilateral) (R>L)   7. Postop check     Controlled Controlled Controlled   Updated Problems: No problems updated.  Plan of Care  Problem-specific:  Assessment and Plan    SI joint pain with swelling   SI  joint pain with swelling has improved with two injections. Initial relief from the local anesthetic and prolonged relief from the steroid indicate reduced swelling. A recent flare-up after physical activity suggests ongoing inflammation. Pre-emptive analgesia with ibuprofen  and magnesium is advised to prevent swelling and pain. She should take four over-the-counter ibuprofen  with food before physical activity and repeat after activity, with a maximum of two magnesium pills per day. If pain persists, consider radiofrequency ablation, starting with the left side and the right side two weeks later. This procedure involves creating a thermal lesion on the nerve, providing relief for 3 to 18 months, with recovery lasting up to six weeks and additional pain medication as needed. Pre-approval is required, with documentation of previous treatments, including physical therapy. Discuss potential use of Celebrex or Mobic  with her primary care physician if regular NSAID use is necessary.       Jennifer Morse has a current medication list which includes the following long-term medication(s): budesonide -formoterol , bupropion , gabapentin , levonorgestrel , phentermine , sumatriptan , and [DISCONTINUED] azelastine .  Pharmacotherapy (Medications Ordered): No orders of the defined types were placed in this encounter.  Orders:  Orders Placed This Encounter  Procedures   Radiofrequency Sacroiliac Joint    Standing Status:   Future    Expiration Date:   05/15/2024    Scheduling Instructions:     Side(s): Left-sided     Level(s): L4, L5, S1, S2, & S3 Medial Branch Nerve(s)     Sedation: With Sedation.     Scheduling Timeframe: As soon as pre-approved     Procedure: Sacroiliac joint RFA   Nursing Instructions:    Please complete this  patient's postprocedure evaluation.    Scheduling Instructions:     Please complete this patient's postprocedure evaluation.     Interventional Therapies  Risk Factors  Considerations  Medical Comorbidities:     Planned  Pending:   Diagnostic bilateral SI joint block #1    Under consideration:   Diagnostic/therapeutic lumbar facet MBB #2  Diagnostic/therapeutic right IA hip inj. #1  Diagnostic/therapeutic L3-4 LESI #1  Diagnostic/therapeutic L5-S1 LESI #1  Diagnostic/therapeutic L5 TFESI #1    Completed:   Diagnostic/therapeutic lumbar facet MBB x1 (11/28/2023) (75/75/80/R:80L:0)    Therapeutic  Palliative (PRN) options:   None established   Completed by other providers:   Diagnostic/therapeutic left interlaminar L5-S1 LESI x2 (05/28/2020) by Reta Cassis, MD [facility])      Return for (ECT): (L) SI RFA #1.    Recent Visits Date Type Provider Dept  02/14/24 Office Visit Renaldo Caroli, MD [facility]  02/01/24 Procedure visit Renaldo Caroli, MD [facility]  01/17/24 Office Visit Renaldo Caroli, MD [facility]  01/02/24 Procedure visit Renaldo Caroli, MD [facility]  12/14/23 Office Visit Renaldo Caroli, MD [facility]  11/28/23 Procedure visit Renaldo Caroli, MD [facility]  Showing recent visits within past 90 days and meeting all other requirements Future Appointments Date Type Provider Dept  02/29/24 Appointment Renaldo Caroli, MD [facility]  Showing future appointments within next 90 days and meeting all other requirements  I discussed the assessment and treatment plan with the patient. The patient was provided an opportunity to ask questions and all were answered. The patient agreed with the plan and demonstrated an understanding of the instructions.  Patient advised to call back or seek an in-person evaluation if the symptoms or condition  worsens.  Duration of encounter: 30 minutes.  Total time on encounter, as per  AMA guidelines included both the face-to-face and non-face-to-face time personally spent by the physician and/or other qualified health care professional(s) on the day of the encounter (includes time in activities that require the physician or other qualified health care professional and does not include time in activities normally performed by clinical staff). Physician's time may include the following activities when performed: Preparing to see the patient (e.g., pre-charting review of records, searching for previously ordered imaging, lab work, and nerve conduction tests) Review of prior analgesic pharmacotherapies. Reviewing PMP Interpreting ordered tests (e.g., lab work, imaging, nerve conduction tests) Performing post-procedure evaluations, including interpretation of diagnostic procedures Obtaining and/or reviewing separately obtained history Performing a medically appropriate examination and/or evaluation Counseling and educating the patient/family/caregiver Ordering medications, tests, or procedures Referring and communicating with other health care professionals (when not separately reported) Documenting clinical information in the electronic or other health record Independently interpreting results (not separately reported) and communicating results to the patient/ family/caregiver Care coordination (not separately reported)  Note by: Candi Chafe, MD (TTS and AI technology used. I apologize for any typographical errors that were not detected and corrected.) Date: 02/14/2024; Time: 8:02 AM

## 2024-02-14 ENCOUNTER — Encounter: Payer: Self-pay | Admitting: Pain Medicine

## 2024-02-14 ENCOUNTER — Ambulatory Visit: Attending: Pain Medicine | Admitting: Pain Medicine

## 2024-02-14 VITALS — BP 107/69 | HR 77 | Temp 98.6°F | Resp 16 | Ht 68.0 in | Wt 200.0 lb

## 2024-02-14 DIAGNOSIS — M533 Sacrococcygeal disorders, not elsewhere classified: Secondary | ICD-10-CM | POA: Diagnosis not present

## 2024-02-14 DIAGNOSIS — G8929 Other chronic pain: Secondary | ICD-10-CM | POA: Diagnosis not present

## 2024-02-14 DIAGNOSIS — M5459 Other low back pain: Secondary | ICD-10-CM | POA: Diagnosis not present

## 2024-02-14 DIAGNOSIS — M25551 Pain in right hip: Secondary | ICD-10-CM | POA: Insufficient documentation

## 2024-02-14 DIAGNOSIS — M25552 Pain in left hip: Secondary | ICD-10-CM | POA: Insufficient documentation

## 2024-02-14 DIAGNOSIS — M545 Low back pain, unspecified: Secondary | ICD-10-CM | POA: Diagnosis not present

## 2024-02-14 DIAGNOSIS — Z09 Encounter for follow-up examination after completed treatment for conditions other than malignant neoplasm: Secondary | ICD-10-CM | POA: Diagnosis not present

## 2024-02-14 NOTE — Patient Instructions (Addendum)
  ______________________________________________________________________    Pain Prevention Technique  Definition:   A technique used to minimize the effects of an activity known to cause inflammation or swelling, which in turn leads to an increase in pain.  Purpose: To prevent swelling from occurring. It is based on the fact that it is easier to prevent swelling from happening than it is to get rid of it, once it occurs.  Contraindications: Anyone with allergy or hypersensitivity to the recommended medications. Anyone taking anticoagulants (Blood Thinners) (e.g., Coumadin, Warfarin, Plavix, etc.). Patients in Renal Failure or having chronic kidney disease.  Technique: Before you undertake an activity known to cause pain, or a flare-up of your chronic pain, and before you experience any pain, do the following:  On a full stomach, take 4 (four) over the counter Ibuprofens 200mg  tablets (Motrin ), for a total of 800 mg. In addition, take over the counter Magnesium 400 to 500 mg, before doing the activity.  Six (6) hours later, again on a full stomach, repeat the Ibuprofen . That night, take a warm shower and stretch under the running warm water.  This technique may be sufficient to abort the pain and discomfort before it happens. Keep in mind that it takes a lot less medication to prevent swelling than it takes to eliminate it once it occurs.   Last Update: 03/16/2023  ______________________________________________________________________    ______________________________________________________________________    OTC Supplements:   The following is a list of over-the-counter (OTC) supplements that have been found to have [employer]) studies suggesting that they may be of some benefits when used in moderation in some chronic pain-related conditions.  NOTE:  Always consult with your primary care provider and/or pharmacist before taking any OTC medications to make  sure they will not interact with your current medications. Always use manufacturer's recommended dosage.  Supplement Possible benefit May be of benefit in treatment of   Turmeric/curcumin anti-inflammatory Joint and muscle aches and pain.  Glucosamine/chondroitin (triple strength) may slow loss of articular cartilage Joint pain.  Vitamin D -3* may suppress release of chemicals associated with inflammation. Increases tolerance to pain. Joint and muscle aches and pain.   Moringa(+) anti-inflammatory with mild analgesic effects Joint and muscle aches and pain.  Melatonin(+) Helps reset sleep cycle. Insomnia.  Vitamin B-12* may help keep nerves and blood cells healthy as well as maintaining function of nervous system Nerve pain (Burning pain)  Alpha-Lipoic-Acid (ALA)* antioxidant that may help with nerve health, pain, and blocking the activation of some inflammatory chemicals Diabetic neuropathy and metabolic syndrome  superoxide dismutase (SOD)** Currently being reviewed.   Tiger Balm Currently being reviewed.   hydrolyzed collagen peptides* Currently being reviewed.  Collagen supplementation may increases bone strength, density, and mass; may improve joint stiffness/mobility, and functionality; and may reduce joint pain. Possible chondroprotective effects. May help with protection of joint health.   Methylsulfonylmethane (MSM)* Currently being reviewed.   CBD(+) Currently being reviewed.   Delta-8 THC(+) Currently being reviewed.   *  Generally Recognized As Safe (GRAS) approved substance.-FDA ([employer]) ** Possibly Safe, but not considered Generally Recognized As Safe (Not GRAS) by the [place]  Food and Drug Administration (FDA) as a food additive. (+) Not considered Generally Recognized As Safe (Not GRAS) by the [place]  Food and Drug Administration (FDA) as a food  additive.  ______________________________________________________________________

## 2024-02-19 ENCOUNTER — Encounter: Payer: Self-pay | Admitting: Neurology

## 2024-02-19 ENCOUNTER — Other Ambulatory Visit: Payer: Self-pay

## 2024-02-19 ENCOUNTER — Telehealth: Payer: 59 | Admitting: Neurology

## 2024-02-19 DIAGNOSIS — G43709 Chronic migraine without aura, not intractable, without status migrainosus: Secondary | ICD-10-CM

## 2024-02-19 DIAGNOSIS — G43009 Migraine without aura, not intractable, without status migrainosus: Secondary | ICD-10-CM

## 2024-02-19 MED ORDER — UBRELVY 100 MG PO TABS
100.0000 mg | ORAL_TABLET | ORAL | 11 refills | Status: AC | PRN
Start: 2024-02-19 — End: ?
  Filled 2024-02-19 – 2024-02-29 (×2): qty 16, 30d supply, fill #0
  Filled 2024-04-09 – 2024-04-22 (×2): qty 16, 30d supply, fill #1
  Filled 2024-06-13: qty 16, 30d supply, fill #2

## 2024-02-19 NOTE — Patient Instructions (Signed)
 Follow up 6 months Try ubrelvy After 3rd round f botox  can consider Emgality or switching to Vyepti if needed

## 2024-02-19 NOTE — Progress Notes (Signed)
 [facility]    Provider:  Dr Tresia Fruit Requesting Provider: Lemar Pyles, NP Primary Care Provider:  Lemar Pyles, NP  CC:  chronic migraine  Virtual Visit via Video Note  I connected with Jennifer Morse on 02/19/24 at  1:30 PM EDT by a video enabled telemedicine application and verified that I am speaking with the correct person using two identifiers.  Location: Patient: her place of work Provider: provider's office   I discussed the limitations of evaluation and management by telemedicine and the availability of in person appointments. The patient expressed understanding and agreed to proceed.  Follow Up Instructions:    I discussed the assessment and treatment plan with the patient. The patient was provided an opportunity to ask questions and all were answered. The patient agreed with the plan and demonstrated an understanding of the instructions.   The patient was advised to call back or seek an in-person evaluation if the symptoms worsen or if the condition fails to improve as anticipated.  I provided 25 minutes of non-face-to-face time during this encounter.   Glory Larsen, MD   02/19/2024: The botox  helped the most the first injection, She has had 2 botox  injections. She still get the migraines with weather. On Ajovy  and had 2 botox . She has 4-6 migraine days a month, so >50% improvement in migraine freq and severity. She gets a headache 2 times a week so < 10 total headache days a month. When she has the headaches and migraines she takes imitrex  that knocks it out, she may drink some caffeine , some of the headaches are in the morning and she does not sleep well and her husband had that bad acident last year and he wakes her often. Does not feel she snores or wakes with dry mouth or snores, she does not feel sleep apnea is something that is going on. Recommend going for the 3rd botox . Other options change to emgality or consider vyepti.     HISTORY: Jennifer Morse last 01/30/2024 for botox  and prior 11/02/2023 with Camilo Cella. Botox  did great for 1.5 months, could then tell it was wearing off. For the 1st 1.5 month, no severe migraines, only mild headache. When it wore off 1-2 migraines a week. Remains on Ajovy . She tried sample of Nurtec, not sure it was helpful. Most effective is Imitrex  and Excedrin Migraine, takes 45 minutes to improve, has to lay in dark room. Works as Scientist, water quality.   HPI:  Jennifer Morse is a 33 y.o. female here as requested by Lemar Pyles, NP for migraines. She works at the [employer] center. She has had migraines since a child. Migraines feel like death. She has light.sound/smell sensitivity, pulsating/pounding/throbbing, can start in the back or front and radiate to the neck, can be one side or the other unilateral but either side, triggres include many such as weather, hormones, stress, light, sound, smell, her young children have migraines and patient has a significant family history of migraines. Patient reports nausea, vomiting. A cold pack, a dark quiet room helps. Daily headaches. No aura. She takes imitrex  and excedrin migraine daily. At least 12 moderate to severe migraine days a month and is it significantly affecting quality of life, debilitating.  MRi recommended but she declines due to personal stressors her husband is in hospital and she denies vision changes, positinal or exertional or any red flags will hold off. She is not having anymore kids, she has an IUD, discussed teratigenicity,  her husband has had ligation. No aura. No aura. No medication overuse.   Reviewed notes, labs and imaging from outside physicians, which showed:  Medications tried that can be used in migraine management greater than 3 months includes: Tylenol , she has tried propranolol  she had bradycardia to 35, Excedrin, Wellbutrin , Benadryl , Lexapro , Fioricet, gabapentin , ibuprofen , Toradol  injections, meloxicam ,  Robaxin , Depo-Medrol  injections, ondansetron , prednisone  tablets, Imitrex , maxalt, tizanidine , topamax, nortriptyline, zomig, relpax, aimvig contraindicated due to constipation, nurtec, ajovy ,      Latest Ref Rng & Units 09/13/2023    2:16 PM 10/19/2022    3:40 PM 11/21/2021   10:04 AM  CBC  WBC 3.4 - 10.8 x10E3/uL 7.6  3.9  4.4   Hemoglobin 11.1 - 15.9 g/dL 16.1  09.6  04.5   Hematocrit 34.0 - 46.6 % 38.9  37.2  41.7   Platelets 150 - 450 x10E3/uL 156  121  150       Latest Ref Rng & Units 09/13/2023    2:16 PM 10/19/2022    3:40 PM 01/17/2022   11:26 AM  CMP  Glucose 70 - 99 mg/dL 89  85    BUN 6 - 20 mg/dL 15  12    Creatinine 4.09 - 1.00 mg/dL 8.11  9.14    Sodium 782 - 144 mmol/L 141  140    Potassium 3.5 - 5.2 mmol/L 3.8  4.1    Chloride 96 - 106 mmol/L 102  104    CO2 20 - 29 mmol/L 24  22    Calcium 8.7 - 10.2 mg/dL 9.6  9.2  8.7   Total Protein 6.0 - 8.5 g/dL 6.9  6.5    Total Bilirubin 0.0 - 1.2 mg/dL 0.2  0.4    Alkaline Phos 44 - 121 IU/L 86  55    AST 0 - 40 IU/L 19  14    ALT 0 - 32 IU/L 25  15     09/19/2022 tsh nml 10/19/2022: vit D 30.7  Review of Systems: Patient complains of symptoms per HPI as well as the following symptoms none. Pertinent negatives and positives per HPI. All others negative.   Social History   Socioeconomic History   Marital status: Married    Spouse name: Derwood Flor   Number of children: 2   Years of education: Not on file   Highest education level: Bachelor's degree (e.g., BA, AB, BS)  Occupational History   Occupation: RN  Tobacco Use   Smoking status: Former    Current packs/day: 0.00    Types: Cigarettes    Quit date: 02/26/2015    Years since quitting: 8.9   Smokeless tobacco: Never  Vaping Use   Vaping status: Never Used  Substance and Sexual Activity   Alcohol use: Not Currently    Alcohol/week: 1.0 standard drink of alcohol    Types: 1 Glasses of wine per week   Drug use: No   Sexual activity: Yes    Partners: Male     Birth control/protection: I.U.D.  Other Topics Concern   Not on file  Social History Narrative   Caffeine : 1-3 cups/day plus what is in Excedrin migraine when taken   Social Drivers of Health   Financial Resource Strain: Medium Risk (05/24/2023)   Overall Financial Resource Strain (CARDIA)    Difficulty of Paying Living Expenses: Somewhat hard  Food Insecurity: No Food Insecurity (05/24/2023)   Hunger Vital Sign    Worried About Running Out of Food in the Last Year: Never  true    Ran Out of Food in the Last Year: Never true  Transportation Needs: No Transportation Needs (05/24/2023)   PRAPARE - Administrator, Civil Service (Medical): No    Lack of Transportation (Non-Medical): No  Physical Activity: Sufficiently Active (05/24/2023)   Exercise Vital Sign    Days of Exercise per Week: 3 days    Minutes of Exercise per Session: 60 min  Stress: Stress Concern Present (05/24/2023)   [employer] of Occupational Health - Occupational Stress Questionnaire    Feeling of Stress : Rather much  Social Connections: Socially Integrated (05/24/2023)   Social Connection and Isolation Panel    Frequency of Communication with Friends and Family: More than three times a week    Frequency of Social Gatherings with Friends and Family: Once a week    Attends Religious Services: More than 4 times per year    Active Member of [employer] or Organizations: Yes    Attends Engineer, structural: More than 4 times per year    Marital Status: Married  Catering manager Violence: Not At Risk (08/13/2021)   Humiliation, Afraid, Rape, and Kick questionnaire    Fear of Current or Ex-Partner: No    Emotionally Abused: No    Physically Abused: No    Sexually Abused: No    Family History  Problem Relation Age of Onset   Migraines Mother    Diabetes Mother    Ovarian cancer Mother    Diabetes Sister    Diabetes Maternal Grandmother    Heart disease Maternal Grandmother    Hypertension  Maternal Grandmother    Kidney cancer Maternal Grandmother    Migraines Maternal Grandfather    Diabetes Maternal Grandfather    Heart disease Maternal Grandfather    Hypertension Maternal Grandfather    Diabetes Maternal Aunt    Migraines Child    Migraines Child    Breast cancer Neg Hx    Colon cancer Neg Hx     Past Medical History:  Diagnosis Date   Anxiety    BRCA negative 07/2021   MyRisk neg; IBIS=11.0%.riskscore=15.8%   Degenerative lumbar disc    Frequent headaches    Gestational thrombocytopenia (HCC)    Lumbar herniated disc    Migraine with aura, intractable, with status migrainosus 1995   Oligomenorrhea    Postpartum depression    with first pregnancy    Patient Active Problem List   Diagnosis Date Noted   Other spondylosis, sacral and sacrococcygeal region 01/02/2024   Sacroiliac joint dysfunction of sides (Bilateral) (L>R) 12/14/2023   Chronic sacroiliac joint pain (Bilateral) (L>R) 12/14/2023   Disorder of sacroiliac joints (Bilateral) (L>R) 12/14/2023   Somatic dysfunction of sacroiliac joints (Bilateral) (L>R) 12/14/2023   Spondylosis without myelopathy or radiculopathy, lumbosacral region 11/28/2023   Lumbar facet joint syndrome 11/28/2023   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   Osteoarthritis of facet joint of lumbar spine 11/28/2023   Chronic pain syndrome 11/15/2023   Pharmacologic therapy 11/15/2023   Disorder of skeletal system 11/15/2023   Problems influencing health status 11/15/2023   Abnormal MRI, lumbar spine (11/06/2023) 11/15/2023   Lumbosacral intervertebral disc herniation (L5-S1) 11/15/2023   Lumbar disc herniation (L3-4) 11/15/2023   Lumbar intervertebral disc protrusion (L3-4) 11/15/2023   Lumbar facet hypertrophy 11/15/2023   Chronic low back pain (1ry area of Pain) (Bilateral) (L>R) w/o sciatica 11/15/2023   Vertebrogenic low back pain 11/15/2023   Low back pain of over 3 months duration 11/15/2023  Mechanical low back  pain 11/15/2023   Multifactorial low back pain 11/15/2023   Lumbar facet joint pain (Bilateral) (R>L) 11/15/2023   Chronic hip pain (2ry area of Pain) (Bilateral) (R>L) 11/15/2023   Grade 1 Lumbar Retrolisthesis of L3/L4 & L5/S1 11/15/2023   DDD (degenerative disc disease), lumbosacral (L5-S1) w/ LBP 11/15/2023   Impaired range of motion of hip (Right) 11/15/2023   Acute on chronic low back pain (Bilateral) (L>R) 10/17/2023   Nonspecific syndrome suggestive of viral illness 05/15/2023   Chronic constipation 10/19/2022   Vitamin D  deficiency 10/16/2022   Anal sphincter spasm 05/24/2022   Grade II internal hemorrhoids 01/21/2022   Thyroid  nodule 01/17/2022   Family history of ovarian cancer 08/02/2021   History of kidney stones 08/19/2020   IUD (intrauterine device) in place 02/27/2020   Overweight (BMI 25.0-29.9) 08/06/2018   Hypothyroidism 09/08/2017   External thrombosed hemorrhoids 05/04/2017   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 Maximum 200mg  a day. Please run copay card BIN [id number] PCN [id number] GRP [id number] ID [id number] EXP 09/04/2024 16 tablet 11   aspirin-acetaminophen -caffeine  (EXCEDRIN MIGRAINE) 250-250-65 MG tablet Take by mouth every 6 (six) hours as needed for headache.     botulinum toxin Type A  (BOTOX ) 200 units injection Provider to inject 155 units into the muscles of the head and neck every 12 weeks. Discard remainder. 1 each 3   budesonide -formoterol  (SYMBICORT ) 160-4.5 MCG/ACT inhaler Inhale 2 puffs into the lungs as needed (Do not take over 12 puffs/day). 10.2 g 3   buPROPion  (WELLBUTRIN  XL) 300 MG 24 hr tablet Take 1 tablet (300 mg total) by mouth daily. 90  tablet 4   busPIRone  (BUSPAR ) 7.5 MG tablet Take 1 tablet (7.5 mg total) by mouth 2 (two) times daily. 180 tablet 4   Fremanezumab -vfrm (AJOVY ) 225 MG/1.5ML SOAJ Inject 225 mg into the skin every 30 (thirty) days. Please run copay card: BIN# [id number] PCN# [id number] GRP# [id number] ID# [id number] 1.5 mL 11   gabapentin  (NEURONTIN ) 600 MG tablet Take 0.5 tablets (300 mg total) by mouth at bedtime. 45 tablet 1   hydrocortisone  (ANUSOL -HC) 2.5 % rectal cream Place 1 application. rectally 2 (two) times daily. 28 g 0   levonorgestrel  (MIRENA ) 20 MCG/24HR IUD 1 each by Intrauterine route once.     lubiprostone  (AMITIZA ) 24 MCG capsule Take 1 capsule (24 mcg total) by mouth 2 (two) times daily with a meal. 180 capsule 4   phentermine  (ADIPEX-P ) 37.5 MG tablet Take 1 tablet (37.5 mg total) by mouth daily before breakfast. 30 tablet 0   SUMAtriptan  (IMITREX ) 100 MG tablet Take 1 tablet (100 mg total) by mouth every 2 (two) hours as needed for migraine. May repeat in 2 hours if headache persists or recurs. 9 tablet 3   Current Facility-Administered Medications  Medication Dose Route Frequency Provider Last Rate Last Admin   Fremanezumab -vfrm SOSY 225 mg  225 mg Subcutaneous Once         Allergies as of 02/19/2024 - Review Complete 02/14/2024  Allergen Reaction Noted   Propranolol   09/13/2023    Vitals: There were no vitals taken for this visit. Last Weight:  Wt Readings from Last 1  Encounters:  02/14/24 200 lb (90.7 kg)   Last Height:   Ht Readings from Last 1 Encounters:  02/14/24 5' 8 (1.727 m)    Physical exam: Exam: Gen: NAD, conversant      CV: No palpitations or chest pain or SOB. VS: Breathing at a normal rate. Weight appears within normal limits. Not febrile. Eyes: Conjunctivae clear without exudates or hemorrhage  Neuro: Detailed Neurologic Exam  Speech:    Speech is normal; fluent and spontaneous with normal comprehension.  Cognition:    The patient is oriented to person, place, and  time;     recent and remote memory intact;     language fluent;     normal attention, concentration, fund of knowledge Cranial Nerves:    The pupils are equal, round, and reactive to light. Visual fields are full Extraocular movements are intact.  The face is symmetric with normal sensation. The palate elevates in the midline. Hearing intact. Voice is normal. Shoulder shrug is normal. The tongue has normal motion without fasciculations.   Coordination: normal  Gait:    No abnormalities noted or reported  Motor Observation:   no involuntary movements noted. Tone:    Appears normal  Posture:    Posture is normal. normal erect    Strength:    Strength is anti-gravity and symmetric in the upper and lower limbs.      Sensation: intact to LT, no reports of numbness or tingling or paresthesias         Assessment/Plan:  Patient with chronic migraines. Had daily headaches and > 12 mod to severe migraine days a month. On ajovy  and botox . Now has 6 migraine days a month and < 10 total hedache days a month  Options: recommend comtinuing with botox , had 2 injections, > 50% improvement in migraine and headache freq and severity Could try Emgality or vyepti if needed next or qulipta 6 month follow up Try Ubrelvy acutely, continue imitrex  as well She will see how the next botox  goes and f/u in mychart if she wants to try a different cgrp, f/u 6 months   Medications tried that can be used in migraine management greater than 3 months includes: Tylenol , she has tried propranolol  she had bradycardia to 35, Excedrin, Wellbutrin , Benadryl , Lexapro , Fioricet, gabapentin , ibuprofen , Toradol  injections, meloxicam , Robaxin , Depo-Medrol  injections, ondansetron , prednisone  tablets, Imitrex , maxalt, tizanidine , topamax, nortriptyline, zomig, relpax, aimvig contraindicated due to constipation, nurtec(did not help), ajovy ,    Consier MRi brai in the future, recommended to patient  No orders of the  defined types were placed in this encounter.  Meds ordered this encounter  Medications   Ubrogepant (UBRELVY) 100 MG TABS    Sig: Take 1 tablet (100 mg total) by mouth every 2 (two) hours as needed. Maximum 200mg  a day. Please run copay card BIN [id number] PCN [id number] GRP [id number] ID [id number] EXP 09/04/2024    Dispense:  16 tablet    Refill:  11    Maximum 200mg  a day. Please run copay card BIN [id number] PCN CNRX GRP [id number] ID [id number] EXP 09/04/2024   Discussed teratogenicity do not get pregnant for 6 months after stopping Ajovy   Cc: Cannady, Jolene T, NP,  Lemar Pyles, NP  Aldona Amel, MD  [facility] [address] [place], [place] [address]  Phone [phone] Fax [phone]

## 2024-02-28 ENCOUNTER — Telehealth: Payer: Self-pay

## 2024-02-28 NOTE — Telephone Encounter (Signed)
 Pharmacy Patient Advocate Encounter   Received notification from CoverMyMeds that prior authorization for Ubrelvy  100MG  tablets is required/requested.   Insurance verification completed.   The patient is insured through [facility] .   Per test claim: PA required; PA submitted to above mentioned insurance via CoverMyMeds Key/confirmation #/EOC [id number] Status is pending

## 2024-02-29 ENCOUNTER — Encounter: Payer: Self-pay | Admitting: Pain Medicine

## 2024-02-29 ENCOUNTER — Ambulatory Visit (HOSPITAL_BASED_OUTPATIENT_CLINIC_OR_DEPARTMENT_OTHER): Admitting: Pain Medicine

## 2024-02-29 ENCOUNTER — Other Ambulatory Visit: Payer: Self-pay

## 2024-02-29 ENCOUNTER — Ambulatory Visit
Admission: RE | Admit: 2024-02-29 | Discharge: 2024-02-29 | Disposition: A | Discharge status: Home / Self Care | Source: Ambulatory Visit | Attending: Pain Medicine | Admitting: Pain Medicine

## 2024-02-29 VITALS — BP 115/88 | HR 85 | Temp 98.1°F | Resp 15 | Ht 68.0 in | Wt 198.0 lb

## 2024-02-29 DIAGNOSIS — M533 Sacrococcygeal disorders, not elsewhere classified: Secondary | ICD-10-CM

## 2024-02-29 DIAGNOSIS — M545 Low back pain, unspecified: Secondary | ICD-10-CM

## 2024-02-29 DIAGNOSIS — M9904 Segmental and somatic dysfunction of sacral region: Secondary | ICD-10-CM | POA: Diagnosis present

## 2024-02-29 DIAGNOSIS — G8929 Other chronic pain: Secondary | ICD-10-CM | POA: Diagnosis present

## 2024-02-29 DIAGNOSIS — G8918 Other acute postprocedural pain: Secondary | ICD-10-CM | POA: Insufficient documentation

## 2024-02-29 DIAGNOSIS — M47898 Other spondylosis, sacral and sacrococcygeal region: Secondary | ICD-10-CM | POA: Diagnosis present

## 2024-02-29 MED ORDER — HYDROCODONE-ACETAMINOPHEN 5-325 MG PO TABS
1.0000 | ORAL_TABLET | Freq: Four times a day (QID) | ORAL | 0 refills | Status: AC | PRN
  Filled ????-??-??: fill #0

## 2024-02-29 MED ORDER — FENTANYL CITRATE (PF) 100 MCG/2ML IJ SOLN
25.0000 ug | INTRAMUSCULAR | Status: DC | PRN
  Administered 2024-02-29: 100 ug via INTRAVENOUS
  Filled 2024-02-29: qty 2

## 2024-02-29 MED ORDER — HYDROCODONE-ACETAMINOPHEN 5-325 MG PO TABS
1.0000 | ORAL_TABLET | Freq: Four times a day (QID) | ORAL | 0 refills | Status: AC | PRN
  Filled 2024-02-29: qty 28, 7d supply, fill #0

## 2024-02-29 MED ORDER — TRIAMCINOLONE ACETONIDE 40 MG/ML IJ SUSP
40.0000 mg | Freq: Once | INTRAMUSCULAR | Status: AC
  Administered 2024-02-29: 40 mg
  Filled 2024-02-29: qty 1

## 2024-02-29 MED ORDER — ROPIVACAINE HCL 2 MG/ML IJ SOLN
9.0000 mL | Freq: Once | INTRAMUSCULAR | Status: AC
  Administered 2024-02-29: 20 mL via PERINEURAL
  Filled 2024-02-29: qty 20

## 2024-02-29 MED ORDER — MIDAZOLAM HCL 5 MG/5ML IJ SOLN
0.5000 mg | Freq: Once | INTRAMUSCULAR | Status: AC
  Administered 2024-02-29: 4 mg via INTRAVENOUS

## 2024-02-29 MED ORDER — PENTAFLUOROPROP-TETRAFLUOROETH EX AERO
INHALATION_SPRAY | Freq: Once | CUTANEOUS | Status: AC
  Administered 2024-02-29: 30 via TOPICAL

## 2024-02-29 MED ORDER — MIDAZOLAM HCL 2 MG/2ML IJ SOLN
INTRAMUSCULAR | Status: AC
Start: 2024-02-29 — End: 2024-02-29
  Filled 2024-02-29: qty 2

## 2024-02-29 MED ORDER — LIDOCAINE HCL 2 % IJ SOLN
20.0000 mL | Freq: Once | INTRAMUSCULAR | Status: AC
  Administered 2024-02-29: 400 mg
  Filled 2024-02-29: qty 20

## 2024-02-29 NOTE — Patient Instructions (Signed)
 ______________________________________________________________________    Post-Radiofrequency (RF) Discharge Instructions  You have just completed a Radiofrequency Neurotomy.  The following instructions will provide you with information and guidelines for self-care upon discharge.  If at any time you have questions or concerns please call your physician. DO NOT DRIVE YOURSELF!!  Instructions: Apply ice: Fill a plastic sandwich bag with crushed ice. Cover it with a small towel and apply to injection site. Apply for 15 minutes then remove x 15 minutes. Repeat sequence on day of procedure, until you go to bed. The purpose is to minimize swelling and discomfort after procedure. Apply heat: Apply heat to procedure site starting the day following the procedure. The purpose is to treat any soreness and discomfort from the procedure. Food intake: No eating limitations, unless stipulated above.  Nevertheless, if you have had sedation, you may experience some nausea.  In this case, it may be wise to wait at least two hours prior to resuming regular diet. Physical activities: Keep activities to a minimum for the first 8 hours after the procedure. For the first 24 hours after the procedure, do not drive a motor vehicle,  Operate heavy machinery, power tools, or handle any weapons.  Consider walking with the use of an assistive device or accompanied by an adult for the first 24 hours.  Do not drink alcoholic beverages including beer.  Do not make any important decisions or sign any legal documents. Go home and rest today.  Resume activities tomorrow, as tolerated.  Use caution in moving about as you may experience mild leg weakness.  Use caution in cooking, use of household electrical appliances and climbing steps. Driving: If you have received any sedation, you are not allowed to drive for 24 hours after your procedure. Blood thinner: Restart your blood thinner 6 hours after your procedure. (Only for those taking  blood thinners) Insulin : As soon as you can eat, you may resume your normal dosing schedule. (Only for those taking insulin ) Medications: May resume pre-procedure medications.  Do not take any drugs, other than what has been prescribed to you. Infection prevention: Keep procedure site clean and dry. Post-procedure Pain Diary: Extremely important that this be done correctly and accurately. Recorded information will be used to determine the next step in treatment. Pain evaluated is that of treated area only. Do not include pain from an untreated area. Complete every hour, on the hour, for the initial 8 hours. Set an alarm to help you do this part accurately. Do not go to sleep and have it completed later. It will not be accurate. Follow-up appointment: Keep your follow-up appointment after the procedure. Usually 2-6 weeks after radiofrequency. Bring you pain diary. The information collected will be essential for your long-term care.   Expect: From numbing medicine (AKA: Local Anesthetics): Numbness or decrease in pain. Onset: Full effect within 15 minutes of injected. Duration: It will depend on the type of local anesthetic used. On the average, 1 to 8 hours.  From steroids (when added): Decrease in swelling or inflammation. Once inflammation is improved, relief of the pain will follow. Onset of benefits: Depends on the amount of swelling present. The more swelling, the longer it will take for the benefits to be seen. In some cases, up to 10 days. Duration: Steroids will stay in the system x 2 weeks. Duration of benefits will depend on multiple posibilities including persistent irritating factors. From procedure: Some discomfort is to be expected once the numbing medicine wears off. In the case of  radiofrequency procedures, this may last as long as 6 weeks. Additional post-procedure pain medication is provided for this. Discomfort is minimized if ice and heat are applied as instructed.  Call  if: You experience numbness and weakness that gets worse with time, as opposed to wearing off. He experience any unusual bleeding, difficulty breathing, or loss of the ability to control your bowel and bladder. (This applies to Spinal procedures only) You experience any redness, swelling, heat, red streaks, elevated temperature, fever, or any other signs of a possible infection.  Emergency Numbers: Durning business hours (Monday - Thursday, 8:00 AM - 4:00 PM) (Friday, 9:00 AM - 12:00 Noon): [phone] After hours: [phone] ______________________________________________________________________      ______________________________________________________________________    Procedure instructions  Stop blood-thinners  Do not eat or drink fluids (other than water) for 6 hours before your procedure  No water for 2 hours before your procedure  Take your blood pressure medicine with a sip of water  Arrive 30 minutes before your appointment  If sedation is planned, bring suitable driver. [place], [place], & public transportation are NOT APPROVED)  Carefully read the Preparing for your procedure detailed instructions  If you have questions call us  at [phone]  Procedure appointments are for procedures only.   NO medication refills or new problem evaluations will be done on procedure days.   Only the scheduled, pre-approved procedure and side will be done.   ______________________________________________________________________      ______________________________________________________________________    Preparing for your procedure  Appointments: If you think you may not be able to keep your appointment, call 24-48 hours in advance to cancel. We need time to make it available to others.  Procedure visits are for procedures only. During your procedure appointment there will be: NO Prescription Refills*. NO medication changes or discussions*. NO discussion of  disability issues*. NO unrelated pain problem evaluations*. NO evaluations to order other pain procedures*. *These will be addressed at a separate and distinct evaluation encounter on the provider's evaluation schedule and not during procedure days.  Instructions: Food intake: Avoid eating anything solid for at least 8 hours prior to your procedure. Clear liquid intake: You may take clear liquids such as water up to 2 hours prior to your procedure. (No carbonated drinks. No soda.) Transportation: Unless otherwise stated by your physician, bring a driver. (Driver cannot be a Market researcher, Pharmacist, community, or any other form of public transportation.) Morning Medicines: Except for blood thinners, take all of your other morning medications with a sip of water. Make sure to take your heart and blood pressure medicines. If your blood pressure's lower number is above 100, the case will be rescheduled. Blood thinners: Make sure to stop your blood thinners as instructed.  If you take a blood thinner, but were not instructed to stop it, call our office [phone] and ask to talk to a nurse. Not stopping a blood thinner prior to certain procedures could lead to serious complications. Diabetics on insulin : Notify the staff so that you can be scheduled 1st case in the morning. If your diabetes requires high dose insulin , take only  of your normal insulin  dose the morning of the procedure and notify the staff that you have done so. Preventing infections: Shower with an antibacterial soap the morning of your procedure.  Build-up your immune system: Take 1000 mg of Vitamin C with every meal (3 times a day) the day prior to your procedure. Antibiotics: Inform the nursing staff if you are taking  any antibiotics or if you have any conditions that may require antibiotics prior to procedures. (Example: recent joint implants)   Pregnancy: If you are pregnant make sure to notify the nursing staff. Not doing so may result in injury to the  fetus, including death.  Sickness: If you have a cold, fever, or any active infections, call and cancel or reschedule your procedure. Receiving steroids while having an infection may result in complications. Arrival: You must be in the facility at least 30 minutes prior to your scheduled procedure. Tardiness: Your scheduled time is also the cutoff time. If you do not arrive at least 15 minutes prior to your procedure, you will be rescheduled.  Children: Do not bring any children with you. Make arrangements to keep them home. Dress appropriately: There is always a possibility that your clothing may get soiled. Avoid long dresses. Valuables: Do not bring any jewelry or valuables.  Reasons to call and reschedule or cancel your procedure: (Following these recommendations will minimize the risk of a serious complication.) Surgeries: Avoid having procedures within 2 weeks of any surgery. (Avoid for 2 weeks before or after any surgery). Flu Shots: Avoid having procedures within 2 weeks of a flu shots or . (Avoid for 2 weeks before or after immunizations). Barium: Avoid having a procedure within 7-10 days after having had a radiological study involving the use of radiological contrast. (Myelograms, Barium swallow or enema study). Heart attacks: Avoid any elective procedures or surgeries for the initial 6 months after a Myocardial Infarction (Heart Attack). Blood thinners: It is imperative that you stop these medications before procedures. Let us  know if you if you take any blood thinner.  Infection: Avoid procedures during or within two weeks of an infection (including chest colds or gastrointestinal problems). Symptoms associated with infections include: Localized redness, fever, chills, night sweats or profuse sweating, burning sensation when voiding, cough, congestion, stuffiness, runny nose, sore throat, diarrhea, nausea, vomiting, cold or Flu symptoms, recent or current infections. It is specially  important if the infection is over the area that we intend to treat. Heart and lung problems: Symptoms that may suggest an active cardiopulmonary problem include: cough, chest pain, breathing difficulties or shortness of breath, dizziness, ankle swelling, uncontrolled high or unusually low blood pressure, and/or palpitations. If you are experiencing any of these symptoms, cancel your procedure and contact your primary care physician for an evaluation.  Remember:  Regular Business hours are:  Monday to Thursday 8:00 AM to 4:00 PM  Provider's Schedule: Eric Como, MD:  Procedure days: Tuesday and Thursday 7:30 AM to 4:00 PM  Wallie Sherry, MD:  Procedure days: Monday and Wednesday 7:30 AM to 4:00 PM Last  Updated: 08/15/2023 ______________________________________________________________________      ______________________________________________________________________    General Risks and Possible Complications  Patient Responsibilities: It is important that you read this as it is part of your informed consent. It is our duty to inform you of the risks and possible complications associated with treatments offered to you. It is your responsibility as a patient to read this and to ask questions about anything that is not clear or that you believe was not covered in this document.  Patient's Rights: You have the right to refuse treatment. You also have the right to change your mind, even after initially having agreed to have the treatment done. However, under this last option, if you wait until the last second to change your mind, you may be charged for the materials used up to  that point.  Introduction: Medicine is not an Visual merchandiser. Everything in Medicine, including the lack of treatment(s), carries the potential for danger, harm, or loss (which is by definition: Risk). In Medicine, a complication is a secondary problem, condition, or disease that can aggravate an already existing  one. All treatments carry the risk of possible complications. The fact that a side effects or complications occurs, does not imply that the treatment was conducted incorrectly. It must be clearly understood that these can happen even when everything is done following the highest safety standards.  No treatment: You can choose not to proceed with the proposed treatment alternative. The "PRO(s)" would include: avoiding the risk of complications associated with the therapy. The "CON(s)" would include: not getting any of the treatment benefits. These benefits fall under one of three categories: diagnostic; therapeutic; and/or palliative. Diagnostic benefits include: getting information which can ultimately lead to improvement of the disease or symptom(s). Therapeutic benefits are those associated with the successful treatment of the disease. Finally, palliative benefits are those related to the decrease of the primary symptoms, without necessarily curing the condition (example: decreasing the pain from a flare-up of a chronic condition, such as incurable terminal cancer).  General Risks and Complications: These are associated to most interventional treatments. They can occur alone, or in combination. They fall under one of the following six (6) categories: no benefit or worsening of symptoms; bleeding; infection; nerve damage; allergic reactions; and/or death. No benefits or worsening of symptoms: In Medicine there are no guarantees, only probabilities. No healthcare provider can ever guarantee that a medical treatment will work, they can only state the probability that it may. Furthermore, there is always the possibility that the condition may worsen, either directly, or indirectly, as a consequence of the treatment. Bleeding: This is more common if the patient is taking a blood thinner, either prescription or over the counter (example: Goody Powders, Fish oil, Aspirin, Garlic, etc.), or if suffering a condition  associated with impaired coagulation (example: Hemophilia, cirrhosis of the liver, low platelet counts, etc.). However, even if you do not have one on these, it can still happen. If you have any of these conditions, or take one of these drugs, make sure to notify your treating physician. Infection: This is more common in patients with a compromised immune system, either due to disease (example: diabetes, cancer, human immunodeficiency virus [HIV], etc.), or due to medications or treatments (example: therapies used to treat cancer and rheumatological diseases). However, even if you do not have one on these, it can still happen. If you have any of these conditions, or take one of these drugs, make sure to notify your treating physician. Nerve Damage: This is more common when the treatment is an invasive one, but it can also happen with the use of medications, such as those used in the treatment of cancer. The damage can occur to small secondary nerves, or to large primary ones, such as those in the spinal cord and brain. This damage may be temporary or permanent and it may lead to impairments that can range from temporary numbness to permanent paralysis and/or brain death. Allergic Reactions: Any time a substance or material comes in contact with our body, there is the possibility of an allergic reaction. These can range from a mild skin rash (contact dermatitis) to a severe systemic reaction (anaphylactic reaction), which can result in death. Death: In general, any medical intervention can result in death, most of the time due to an  unforeseen complication. ______________________________________________________________________

## 2024-02-29 NOTE — Progress Notes (Signed)
 PROVIDER NOTE: Interpretation of information contained herein should be left to medically-trained personnel. Specific patient instructions are provided elsewhere under Patient Instructions section of medical record. This document was created in part using STT-dictation technology, any transcriptional errors that may result from this process are unintentional.  Patient: Jennifer Morse Type: Established DOB: Dec 12, 1990 MRN: [id number] PCP: Valerio Melanie DASEN, NP  Service: Procedure DOS: 02/29/2024 Setting: Ambulatory Location: Ambulatory outpatient facility Delivery: Face-to-face Provider: Eric DELENA Como, MD Specialty: Interventional Pain Management Specialty designation: 09 Location: Outpatient facility Ref. Prov.: Cannady, Jolene T, NP       Interventional Therapy   Procedure: Sacroiliac joint Radiofrequency Ablation (RFA) #1  Laterality: Left  Level: L5, S1, S2, & S3 Medial Branch Level(s), at the posterior Sacroiliac Joint   Imaging: Fluoroscopic guidance Non-spinal ([id number]) Anesthesia: Local anesthesia (1-2% Lidocaine ) Anxiolysis: IV Versed  4.0 mg Sedation: Moderate Sedation Fentanyl  2.0 mL (100 mcg) DOS: 02/29/2024  Performed by: Eric DELENA Como, MD  Purpose: Therapeutic Indications: Pain in the lower back and hip area severe enough to impact quality of life or function. Indications: 1. Chronic low back pain (1ry area of Pain) (Bilateral) (L>R) w/o sciatica   2. Chronic sacroiliac joint pain (Bilateral) (L>R)   3. 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 Somatic dysfunction of sacroiliac joints (Bilateral) (L>R)   6. Other spondylosis, sacral and sacrococcygeal region   7. Low back pain of over 3 months duration   8. Multifactorial low back pain   9. Acute postoperative pain    Jennifer Morse has been dealing with the above chronic pain for longer than three months and has either failed to  respond, was unable to tolerate, or simply did not get enough benefit from other more conservative therapies including, but not limited to: 1. Over-the-counter medications 2. Anti-inflammatory medications 3. Muscle relaxants 4. Membrane stabilizers 5. Opioids 6. Physical therapy and/or chiropractic manipulation 7. Modalities (Heat, ice, etc.) 8. Invasive techniques such as nerve blocks. Jennifer Morse has attained more than 50% relief of the pain from a series of diagnostic injections conducted in separate occasions.  Pain Score: Pre-procedure: 4 /10 Post-procedure: 2 /10     Position / Prep / Materials:  Position: Prone  Prep solution: ChloraPrep (2% chlorhexidine gluconate and 70% isopropyl alcohol) Prep Area: Entire bilateral lumbosacral region starting at the mid lower back and going down to the lower buttocks area, and including from flank/hip to flank/hip. Materials:  Tray:  RFA (Radiofrequency) tray Needle(s):  Type: RFA (Teflon-coated radiofrequency ablation needles)  Gauge (G): 22  Length: Regular (10cm)  Qty: 4  H&P (Pre-op Assessment):  Jennifer Morse is a 33 y.o. (year old), female patient, seen today for interventional treatment. She  has a past surgical history that includes Wisdom tooth extraction (2012). Jennifer Morse has a current medication list which includes the following prescription(s): aspirin-acetaminophen -caffeine , botulinum toxin type a , budesonide -formoterol , bupropion , buspirone , ajovy , gabapentin , hydrocodone -acetaminophen , [START ON 03/07/2024] hydrocodone -acetaminophen , hydrocortisone , levonorgestrel , lubiprostone , phentermine , sumatriptan , ubrelvy , and [DISCONTINUED] azelastine , and the following Facility-Administered Medications: fentanyl  and fremanezumab -vfrm. Her primarily concern today is the Back Pain (Lower, worse on Left) and Hip Pain (Intermittent )  Initial Vital Signs:  Pulse/HCG Rate: 85ECG Heart Rate: 78 Temp: 98 F (36.7 C) Resp: 18 BP:  108/81 SpO2: 99 %  BMI: Estimated body mass index is 30.11 kg/m as calculated from the following:   Height as of this encounter: 5' 8 (1.727 m).   Weight  as of this encounter: 198 lb (89.8 kg).  Risk Assessment: Allergies: Reviewed. She is allergic to propranolol .  Allergy Precautions: None required Coagulopathies: Reviewed. None identified.  Blood-thinner therapy: None at this time Active Infection(s): Reviewed. None identified. Jennifer Morse is afebrile  Site Confirmation: Jennifer Morse was asked to confirm the procedure and laterality before marking the site Procedure checklist: Completed Consent: Before the procedure and under the influence of no sedative(s), amnesic(s), or anxiolytics, the patient was informed of the treatment options, risks and possible complications. To fulfill our ethical and legal obligations, as recommended by the American Medical Association's Code of Ethics, I have informed the patient of my clinical impression; the nature and purpose of the treatment or procedure; the risks, benefits, and possible complications of the intervention; the alternatives, including doing nothing; the risk(s) and benefit(s) of the alternative treatment(s) or procedure(s); and the risk(s) and benefit(s) of doing nothing. The patient was provided information about the general risks and possible complications associated with the procedure. These may include, but are not limited to: failure to achieve desired goals, infection, bleeding, organ or nerve damage, allergic reactions, paralysis, and death. In addition, the patient was informed of those risks and complications associated to the procedure, such as failure to decrease pain; infection; bleeding; organ or nerve damage with subsequent damage to sensory, motor, and/or autonomic systems, resulting in permanent pain, numbness, and/or weakness of one or several areas of the body; allergic reactions; (i.e.: anaphylactic reaction); and/or  death. Furthermore, the patient was informed of those risks and complications associated with the medications. These include, but are not limited to: allergic reactions (i.e.: anaphylactic or anaphylactoid reaction(s)); adrenal axis suppression; blood sugar elevation that in diabetics may result in ketoacidosis or comma; water retention that in patients with history of congestive heart failure may result in shortness of breath, pulmonary edema, and decompensation with resultant heart failure; weight gain; swelling or edema; medication-induced neural toxicity; particulate matter embolism and blood vessel occlusion with resultant organ, and/or nervous system infarction; and/or aseptic necrosis of one or more joints. Finally, the patient was informed that Medicine is not an exact science; therefore, there is also the possibility of unforeseen or unpredictable risks and/or possible complications that may result in a catastrophic outcome. The patient indicated having understood very clearly. We have given the patient no guarantees and we have made no promises. Enough time was given to the patient to ask questions, all of which were answered to the patient's satisfaction. Ms. Ackerley has indicated that she wanted to continue with the procedure. Attestation: I, the ordering provider, attest that I have discussed with the patient the benefits, risks, side-effects, alternatives, likelihood of achieving goals, and potential problems during recovery for the procedure that I have provided informed consent. Date  Time: 02/29/2024 12:29 PM  Pre-Procedure Preparation:  Monitoring: As per clinic protocol. Respiration, ETCO2, SpO2, BP, heart rate and rhythm monitor placed and checked for adequate function Safety Precautions: Patient was assessed for positional comfort and pressure points before starting the procedure. Time-out: I initiated and conducted the Time-out before starting the procedure, as per protocol. The  patient was asked to participate by confirming the accuracy of the Time Out information. Verification of the correct person, site, and procedure were performed and confirmed by me, the nursing staff, and the patient. Time-out conducted as per Joint Commission's Universal Protocol (UP.01.01.01). Time: 1322 Start Time: 1322 hrs.  Description/Narrative of Procedure:          Start Time: 1322 hrs.  Target: L5, S1, S2, & S3 Medial Branch Level(s), at the posterior Sacroiliac Joint Region: Posterolateral Lumbosacral Spine (PSIS area) Approach: Percutaneous   Rationale (medical necessity): procedure needed and proper for the diagnosis and/or treatment of the patient's medical symptoms and needs. Procedural Technique Safety Precautions: Aspiration looking for blood return was conducted prior to all injections. At no point did we inject any substances, as a needle was being advanced. No attempts were made at seeking any paresthesias. Safe injection practices and needle disposal techniques used. Medications properly checked for expiration dates. SDV (single dose vial) medications used. Local Anesthesia: Protocol guidelines were followed. The patient was positioned over the fluoroscopy table. The area was prepped in the usual manner. The time-out was completed. The target area was identified using fluoroscopy. A 12-in long, straight, sterile hemostat was used with fluoroscopic guidance to locate the targets for each level blocked. Once located, the skin was marked with an approved surgical skin marker. Once all sites were marked, the skin (epidermis, dermis, and hypodermis), as well as deeper tissues (fat, connective tissue and muscle) were infiltrated with a small amount of a short-acting local anesthetic, loaded on a 10cc syringe with a 25G, 1.5-in  Needle. An appropriate amount of time was allowed for local anesthetics to take effect before proceeding to the next step.  Technical explanation of process:   Radiofrequency Ablation (RFA) L5 Medial Branch Nerve RFA: The target area for the L5 medial branch is at the junction of the postero-lateral aspect of the superior articular process of S1 and the superior, posterior, and medial edge of the sacral ala. Under fluoroscopic guidance, a Radiofrequency needle was inserted until contact was made with os over the superior postero-lateral aspect of the pedicular shadow (target area). Sensory and motor testing was conducted to properly adjust the position of the needle. Once satisfactory placement of the needle was achieved, the numbing solution was slowly injected after negative aspiration for blood. 2.0 mL of the nerve block solution was injected without difficulty or complication. After waiting for at least 3 minutes, the ablation was performed. Once completed, the needle was removed intact. S1 Primary Dorsal Rami and Lateral Branch Nerve RFA: The target area for the S1 medial branch is located inferior to the junction of the S1 superior articular process and the L5 inferior articular process, posterior, inferior, and lateral to the 6 o'clock position of the L5-S1 facet joint, just superior to the S1 posterior foramen. Under fluoroscopic guidance, the Radiofrequency needle was advanced until contact was made with os over the Target area. Sensory and motor testing was conducted to properly adjust the position of the needle. Once satisfactory placement of the needle was achieved, the numbing solution was slowly injected after negative aspiration for blood. 2.0 mL of the nerve block solution was injected without difficulty or complication. After waiting for at least 3 minutes, the ablation was performed. Once completed, the needle was removed intact. S2 Primary Dorsal Rami and Lateral Branch Nerve RFA: The target area for the S2 medial branch is at the posterior superior lateral of the S2 posterior neural foramen. Under fluoroscopic guidance, the Radiofrequency needle  was advanced until contact was made with os over the Target area. Sensory and motor testing was conducted to properly adjust the position of the needle. Once satisfactory placement of the needle was achieved, the numbing solution was slowly injected after negative aspiration for blood. 2.0 mL of the nerve block solution was injected without difficulty or complication. After waiting for at  least 3 minutes, the ablation was performed. Once completed, the needle was removed intact. S3 Primary Dorsal Rami and Lateral Branch Nerve RFA: The target area for the S3 medial branch is at the posterior superior lateral of the S3 posterior neural foramen. Under fluoroscopic guidance, the Radiofrequency needle was advanced until contact was made with os over the Target area. Sensory and motor testing was conducted to properly adjust the position of the needle. Once satisfactory placement of the needle was achieved, the numbing solution was slowly injected after negative aspiration for blood. 2.0 mL of the nerve block solution was injected without difficulty or complication. After waiting for at least 3 minutes, the ablation was performed. Once completed, the needle was removed intact. Radiofrequency lesioning (ablation):  Radiofrequency Generator: Medtronic AccurianTM AG 1000 RF Generator Sensory Stimulation Parameters: 50 Hz was used to locate & identify the nerve, making sure that the needle was positioned such that there was no sensory stimulation below 0.3 V or above 0.7 V. Motor Stimulation Parameters: 2 Hz was used to evaluate the motor component. Care was taken not to lesion any nerves that demonstrated motor stimulation of the lower extremities at an output of less than 2.5 times that of the sensory threshold, or a maximum of 2.0 V. Lesioning Technique Parameters: Standard Radiofrequency settings. (Not bipolar or pulsed.) Temperature Settings: 80 degrees C Lesioning time: 60 seconds Stationary intra-operative  compliance: Compliant Materials & Medications: Needle(s) (Electrode/Cannula) Type: Teflon-coated, curved tip, Radiofrequency needle(s) Gauge: 22G Length: 10cm Numbing solution: 0.2% PF-Ropivacaine  + Triamcinolone  (40 mg/mL) diluted to a final concentration of 4 mg of Triamcinolone /mL of Ropivacaine  The unused portion of the solution was discarded in the proper designated containers.   Once the entire procedure was completed, the treated area was cleaned, making sure to leave some of the prepping solution back to take advantage of its long term bactericidal properties. Intra-operative Compliance: Compliant    Vitals:   02/29/24 1347 02/29/24 1354 02/29/24 1403 02/29/24 1414  BP: 114/82 105/86 114/75 115/88  Pulse:      Resp: 16 17 13 15   Temp:  99 F (37.2 C)  98.1 F (36.7 C)  TempSrc:  Temporal  Temporal  SpO2: 100% 96% 100% 100%  Weight:      Height:        End Time: 1347 hrs.  Imaging Guidance (Non-Spinal):          Type of Imaging Technique: Fluoroscopy Guidance (Non-Spinal) Indication(s): Fluoroscopy guidance for needle placement to enhance accuracy in procedures requiring precise needle localization for targeted delivery of medication in or near specific anatomical locations not easily accessible without such real-time imaging assistance. Exposure Time: Please see nurses notes. Contrast: Before injecting any contrast, we confirmed that the patient did not have an allergy to iodine, shellfish, or radiological contrast. Once satisfactory needle placement was completed at the desired level, radiological contrast was injected. Contrast injected under live fluoroscopy. No contrast complications. See chart for type and volume of contrast used. Fluoroscopic Guidance: I was personally present during the use of fluoroscopy. Tunnel Vision Technique used to obtain the best possible view of the target area. Parallax error corrected before commencing the procedure.  Direction-depth-direction technique used to introduce the needle under continuous pulsed fluoroscopy. Once target was reached, antero-posterior, oblique, and lateral fluoroscopic projection used confirm needle placement in all planes. Images permanently stored in EMR. Interpretation: I personally interpreted the imaging intraoperatively. Adequate needle placement confirmed in multiple planes. Appropriate spread of contrast into desired area was  observed. No evidence of afferent or efferent intravascular uptake. Permanent images saved into the patient's record.  Antibiotic Prophylaxis:   Anti-infectives (From admission, onward)    None      Indication(s): None identified  Post-operative Assessment:  Post-procedure Vital Signs:  Pulse/HCG Rate: 8584 Temp: 98.1 F (36.7 C) Resp: 15 BP: 115/88 SpO2: 100 %  EBL: None  Complications: No immediate post-treatment complications observed by team, or reported by patient.  Note: The patient tolerated the entire procedure well. A repeat set of vitals were taken after the procedure and the patient was kept under observation following institutional policy, for this type of procedure. Post-procedural neurological assessment was performed, showing return to baseline, prior to discharge. The patient was provided with post-procedure discharge instructions, including a section on how to identify potential problems. Should any problems arise concerning this procedure, the patient was given instructions to immediately contact us , at any time, without hesitation. In any case, we plan to contact the patient by telephone for a follow-up status report regarding this interventional procedure.  Comments:  No additional relevant information.  Plan of Care (POC)  Orders:  Orders Placed This Encounter  Procedures   Radiofrequency Sacroiliac Joint    Scheduling Instructions:     Side(s): Left-sided     Level(s): L4, L5, S1, S2, & S3 Medial Branch Nerve(s)      Sedation: With Sedation.     Date: 02/29/2024     Procedure: Sacroiliac joint RFA   Radiofrequency Sacroiliac Joint    Standing Status:   Future    Expiration Date:   05/31/2024    Scheduling Instructions:     Side(s): Right-sided     Level(s): L4, L5, S1, S2, & S3 Medial Branch Nerve(s)     Sedation: With Sedation.     Scheduling Timeframe: 2 weeks from now     Procedure: Sacroiliac joint RFA   [facility] C-ARM 1-60 MIN NO REPORT    Intraoperative interpretation by procedural physician at [facility].    Standing Status:   Standing    Number of Occurrences:   1    Reason for exam::   Assistance in needle guidance and placement for procedures requiring needle placement in or near specific anatomical locations not easily accessible without such assistance.   Informed Consent Details: Physician/Practitioner Attestation; Transcribe to consent form and obtain patient signature    Nursing Order: Transcribe to consent form and obtain patient signature. Note: Always confirm laterality of pain with Ms. Sander, before procedure.    Physician/Practitioner attestation of informed consent for procedure/surgical case:   I, the physician/practitioner, attest that I have discussed with the patient the benefits, risks, side effects, alternatives, likelihood of achieving goals and potential problems during recovery for the procedure that I have provided informed consent.    Procedure:   Sacroiliac joint radiofrequency ablation    Physician/Practitioner performing the procedure:   Bena Kobel A. Tanya, MD    Indication/Reason:   Chronic sacroiliac joint pain   Provide equipment / supplies at bedside    Procedure tray: Radiofrequency Tray Additional material: Large hemostat (x1); Small hemostat (x1); Towels (x8); 4x4 sterile sponge pack (x1) Needle type: Teflon-coated Radiofrequency Needle (Disposable  single use) Size: Regular Quantity: 4    Standing Status:   Standing    Number of  Occurrences:   1    Specify:   Radiofrequency Tray   Saline lock IV    Have LR [phone] mL available and administer at 125  mL/hr if patient becomes hypotensive.    Standing Status:   Standing    Number of Occurrences:   1    Opioid Analgesic(s):  Analgesic: No chronic opioid analgesics therapy prescribed by our practice. hydrocodone /APAP 10/325, 1 tab p.o. 4 times daily  MME/day: 40 mg/day    Medications ordered for procedure: Meds ordered this encounter  Medications   lidocaine  (XYLOCAINE ) 2 % (with pres) injection 400 mg   pentafluoroprop-tetrafluoroeth (GEBAUERS) aerosol   midazolam  (VERSED ) 5 MG/5ML injection 0.5-2 mg    Make sure Flumazenil is available in the pyxis when using this medication. If oversedation occurs, administer 0.2 mg IV over 15 sec. If after 45 sec no response, administer 0.2 mg again over 1 min; may repeat at 1 min intervals; not to exceed 4 doses (1 mg)   fentaNYL  (SUBLIMAZE ) injection 25-50 mcg    Make sure Narcan  is available in the pyxis when using this medication. In the event of respiratory depression (RR< 8/min): Titrate NARCAN  (naloxone ) in increments of 0.1 to 0.2 mg IV at 2-3 minute intervals, until desired degree of reversal.   ropivacaine  (PF) 2 mg/mL (0.2%) (NAROPIN ) injection 9 mL   triamcinolone  acetonide (KENALOG -40) injection 40 mg   HYDROcodone -acetaminophen  (NORCO/VICODIN) 5-325 MG tablet    Sig: Take 1 tablet by mouth every 6 (six) hours as needed for up to 7 days for severe pain (pain score 7-10). Must last 7 days.    Dispense:  28 tablet    Refill:  0    For acute post-operative pain. Not to be refilled. Must last 7 days.   HYDROcodone -acetaminophen  (NORCO/VICODIN) 5-325 MG tablet    Sig: Take 1 tablet by mouth every 6 (six) hours as needed for up to 7 days for severe pain (pain score 7-10). Must last 7 days.    Dispense:  28 tablet    Refill:  0    For acute post-operative pain. Not to be refilled.  Must last 7 days.   Medications  administered: We administered lidocaine , pentafluoroprop-tetrafluoroeth, midazolam , fentaNYL , ropivacaine  (PF) 2 mg/mL (0.2%), and triamcinolone  acetonide.  See the medical record for exact dosing, route, and time of administration.    Interventional Therapies  Risk Factors  Considerations  Medical Comorbidities:     Planned  Pending:   Therapeutic left SI RFA #1 (02/29/2024)    Under consideration:   Therapeutic bilateral SI RFA #1 (starting with the left side)    Completed:   Diagnostic bilateral SI joint block x2 (02/01/2024) (100/100/75/75)  Diagnostic/therapeutic lumbar facet MBB x1 (11/28/2023) (75/75/80/R:80L:0)    Therapeutic  Palliative (PRN) options:   None established   Completed by other providers:   Diagnostic/therapeutic left interlaminar L5-S1 LESI x2 (05/28/2020) by Elsie Shoulder, MD [facility])       Follow-up plan:   Return in about 2 weeks (around 03/14/2024) for (ECT): (R) SI RFA #1.     Recent Visits Date Type Provider Dept  02/14/24 Office Visit Tanya Glisson, MD [facility]  02/01/24 Procedure visit Tanya Glisson, MD [facility]  01/17/24 Office Visit Tanya Glisson, MD [facility]  01/02/24 Procedure visit Tanya Glisson, MD [facility]  12/14/23 Office Visit Tanya Glisson, MD [facility]  Showing recent visits within past 90 days and meeting all other requirements Today's Visits Date Type Provider Dept  02/29/24 Procedure visit Tanya Glisson, MD [facility]  Showing today's visits and meeting all other requirements Future Appointments Date Type Provider Dept  03/19/24  Appointment Tanya Glisson, MD [facility]  Showing future appointments within next 90 days and meeting all other requirements   Disposition: Discharge home  Discharge (Date  Time): 02/29/2024; 1417 hrs.   Primary Care Physician: Cannady, Jolene T, NP Location: [facility] Note by: Glisson DELENA Tanya, MD (TTS technology used. I apologize for any typographical errors that were not detected and corrected.) Date: 02/29/2024; Time: 2:54 PM  Disclaimer:  Medicine is not an Visual merchandiser. The only guarantee in medicine is that nothing is guaranteed. It is important to note that the decision to proceed with this intervention was based on the information collected from the patient. The Data and conclusions were drawn from the patient's questionnaire, the interview, and the physical examination. Because the information was provided in large part by the patient, it cannot be guaranteed that it has not been purposely or unconsciously manipulated. Every effort has been made to obtain as much relevant data as possible for this evaluation. It is important to note that the conclusions that lead to this procedure are derived in large part from the available data. Always take into account that the treatment will also be dependent on availability of resources and existing treatment guidelines, considered by other Pain Management Practitioners as being common knowledge and practice, at the time of the intervention. For Medico-Legal purposes, it is also important to point out that variation in procedural techniques and pharmacological choices are the acceptable norm. The indications, contraindications, technique, and results of the above procedure should only be interpreted and judged by a Board-Certified Interventional Pain Specialist with extensive familiarity and expertise in the same exact procedure and technique.

## 2024-03-01 ENCOUNTER — Telehealth: Payer: Self-pay

## 2024-03-01 ENCOUNTER — Other Ambulatory Visit (HOSPITAL_COMMUNITY): Payer: Self-pay

## 2024-03-01 NOTE — Telephone Encounter (Signed)
 Post procedure follow up.  Patient states she is doing good.

## 2024-03-01 NOTE — Telephone Encounter (Signed)
 Pharmacy Patient Advocate Encounter  Received notification from [facility] that Prior Authorization for Ubrelvy  100MG  tablets has been APPROVED from 02/29/2024 to 08/27/2024   PA #/Case ID/Reference #: PA Case ID #: [id number]

## 2024-03-19 ENCOUNTER — Other Ambulatory Visit: Payer: Self-pay

## 2024-03-19 ENCOUNTER — Encounter: Payer: Self-pay | Admitting: Pain Medicine

## 2024-03-19 ENCOUNTER — Ambulatory Visit
Admission: RE | Admit: 2024-03-19 | Discharge: 2024-03-19 | Disposition: A | Discharge status: Home / Self Care | Source: Ambulatory Visit | Attending: Pain Medicine | Admitting: Pain Medicine

## 2024-03-19 ENCOUNTER — Ambulatory Visit (HOSPITAL_BASED_OUTPATIENT_CLINIC_OR_DEPARTMENT_OTHER): Admitting: Pain Medicine

## 2024-03-19 VITALS — BP 109/72 | HR 67 | Temp 97.0°F | Resp 16 | Ht 68.0 in | Wt 195.0 lb

## 2024-03-19 DIAGNOSIS — Z09 Encounter for follow-up examination after completed treatment for conditions other than malignant neoplasm: Secondary | ICD-10-CM | POA: Insufficient documentation

## 2024-03-19 DIAGNOSIS — M9904 Segmental and somatic dysfunction of sacral region: Secondary | ICD-10-CM | POA: Diagnosis present

## 2024-03-19 DIAGNOSIS — G8929 Other chronic pain: Secondary | ICD-10-CM | POA: Insufficient documentation

## 2024-03-19 DIAGNOSIS — M533 Sacrococcygeal disorders, not elsewhere classified: Secondary | ICD-10-CM | POA: Diagnosis not present

## 2024-03-19 DIAGNOSIS — M47898 Other spondylosis, sacral and sacrococcygeal region: Secondary | ICD-10-CM

## 2024-03-19 DIAGNOSIS — Z5189 Encounter for other specified aftercare: Secondary | ICD-10-CM | POA: Diagnosis present

## 2024-03-19 DIAGNOSIS — G8918 Other acute postprocedural pain: Secondary | ICD-10-CM | POA: Diagnosis present

## 2024-03-19 DIAGNOSIS — M545 Low back pain, unspecified: Secondary | ICD-10-CM | POA: Insufficient documentation

## 2024-03-19 MED ORDER — TRIAMCINOLONE ACETONIDE 40 MG/ML IJ SUSP
INTRAMUSCULAR | Status: AC
  Filled 2024-03-19: qty 1

## 2024-03-19 MED ORDER — PENTAFLUOROPROP-TETRAFLUOROETH EX AERO
INHALATION_SPRAY | Freq: Once | CUTANEOUS | Status: AC
  Administered 2024-03-19: 30 via TOPICAL

## 2024-03-19 MED ORDER — ROPIVACAINE HCL 2 MG/ML IJ SOLN
INTRAMUSCULAR | Status: AC
  Filled 2024-03-19: qty 20

## 2024-03-19 MED ORDER — TRIAMCINOLONE ACETONIDE 40 MG/ML IJ SUSP
40.0000 mg | Freq: Once | INTRAMUSCULAR | Status: AC
  Administered 2024-03-19: 40 mg

## 2024-03-19 MED ORDER — FENTANYL CITRATE (PF) 100 MCG/2ML IJ SOLN
25.0000 ug | INTRAMUSCULAR | Status: AC | PRN
  Administered 2024-03-19 (×2): 50 ug via INTRAVENOUS

## 2024-03-19 MED ORDER — FENTANYL CITRATE (PF) 100 MCG/2ML IJ SOLN
INTRAMUSCULAR | Status: AC
  Filled 2024-03-19: qty 2

## 2024-03-19 MED ORDER — ROPIVACAINE HCL 2 MG/ML IJ SOLN
9.0000 mL | Freq: Once | INTRAMUSCULAR | Status: AC
  Administered 2024-03-19: 9 mL via PERINEURAL

## 2024-03-19 MED ORDER — LIDOCAINE HCL 2 % IJ SOLN
20.0000 mL | Freq: Once | INTRAMUSCULAR | Status: AC
  Administered 2024-03-19: 400 mg

## 2024-03-19 MED ORDER — MIDAZOLAM HCL 5 MG/5ML IJ SOLN
INTRAMUSCULAR | Status: AC
  Filled 2024-03-19: qty 5

## 2024-03-19 MED ORDER — HYDROCODONE-ACETAMINOPHEN 5-325 MG PO TABS
1.0000 | ORAL_TABLET | Freq: Four times a day (QID) | ORAL | 0 refills | Status: AC | PRN
  Filled ????-??-??: fill #0

## 2024-03-19 MED ORDER — LIDOCAINE HCL 2 % IJ SOLN
INTRAMUSCULAR | Status: AC
  Filled 2024-03-19: qty 20

## 2024-03-19 MED ORDER — MIDAZOLAM HCL 5 MG/5ML IJ SOLN
0.5000 mg | Freq: Once | INTRAMUSCULAR | Status: AC
  Administered 2024-03-19 (×4): 1 mg via INTRAVENOUS

## 2024-03-19 MED ORDER — HYDROCODONE-ACETAMINOPHEN 5-325 MG PO TABS
1.0000 | ORAL_TABLET | Freq: Four times a day (QID) | ORAL | 0 refills | Status: AC | PRN
  Filled 2024-03-19: qty 28, 7d supply, fill #0

## 2024-03-19 NOTE — Patient Instructions (Signed)

## 2024-03-19 NOTE — Progress Notes (Signed)
 PROVIDER NOTE: Interpretation of information contained herein should be left to medically-trained personnel. Specific patient instructions are provided elsewhere under Patient Instructions section of medical record. This document was created in part using STT-dictation technology, any transcriptional errors that may result from this process are unintentional.  Patient: Jennifer Morse Type: Established DOB: Apr 11, 1991 MRN: [id number] PCP: Valerio Melanie DASEN, NP  Service: Procedure DOS: 03/19/2024 Setting: Ambulatory Location: Ambulatory outpatient facility Delivery: Face-to-face Provider: Eric DELENA Como, MD Specialty: Interventional Pain Management Specialty designation: 09 Location: Outpatient facility Ref. Prov.: Cannady, Jolene T, NP       Interventional Therapy   Procedure: Sacroiliac joint Radiofrequency Ablation (RFA) #1  Laterality: Right  Level: L5, S1, S2, & S3 Medial Branch Level(s), at the posterior Sacroiliac Joint   Imaging: Fluoroscopic guidance Spinal ([id number]) Anesthesia: Local anesthesia (1-2% Lidocaine ) Anxiolysis: IV Versed  4.0 mg Sedation: Moderate Sedation Fentanyl  2.0 mL (100 mcg) DOS: 03/19/2024  Performed by: Eric DELENA Como, MD  Purpose: Therapeutic Indications: Pain in the lower back and hip area severe enough to impact quality of life or function. Indications: 1. Chronic sacroiliac joint pain (Bilateral) (L>R)   2. 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 Somatic dysfunction of sacroiliac joints (Bilateral) (L>R)   4. Other spondylosis, sacral and sacrococcygeal region   5. Multifactorial low back pain   6. Chronic low back pain (1ry area of Pain) (Bilateral) (L>R) w/o sciatica   7. Encounter for therapeutic procedure    [name] has been dealing with the above chronic pain for longer than three months and has either failed to respond, was unable to tolerate, or simply did not get enough benefit from other more  conservative therapies including, but not limited to: 1. Over-the-counter medications 2. Anti-inflammatory medications 3. Muscle relaxants 4. Membrane stabilizers 5. Opioids 6. Physical therapy and/or chiropractic manipulation 7. Modalities (Heat, ice, etc.) 8. Invasive techniques such as nerve blocks. [name] has attained more than 50% relief of the pain from a series of diagnostic injections conducted in separate occasions.  Pain Score: Pre-procedure: 5 /10 Post-procedure: 0-No pain/10     Position / Prep / Materials:  Position: Prone  Prep solution: ChloraPrep (2% chlorhexidine gluconate and 70% isopropyl alcohol) Prep Area: Entire bilateral lumbosacral region starting at the mid lower back and going down to the lower buttocks area, and including from flank/hip to flank/hip. Materials:  Tray:  RFA (Radiofrequency) tray Needle(s):  Type: RFA (Teflon-coated radiofrequency ablation needles)  Gauge (G): 22  Length: Regular (10cm)  Qty: 5  Post-Procedure Evaluation   Procedure: Sacroiliac joint Radiofrequency Ablation (RFA) #1  Laterality: Left  Level: L5, S1, S2, & S3 Medial Branch Level(s), at the posterior Sacroiliac Joint   Imaging: Fluoroscopic guidance Non-spinal ([id number]) Anesthesia: Local anesthesia (1-2% Lidocaine ) Anxiolysis: IV Versed  4.0 mg Sedation: Moderate Sedation Fentanyl  2.0 mL (100 mcg) DOS: 02/29/2024  Performed by: Eric DELENA Como, MD Purpose: Therapeutic Indications: Pain in the lower back and hip area severe enough to impact quality of life or function. Pain Score: Pre-procedure: 4 /10 Post-procedure: 2 /10    Effectiveness:  Initial hour after procedure: 100 %. Subsequent 4-6 hours post-procedure: 100 %. Analgesia past initial 6 hours: 100 %. Ongoing improvement:  Analgesic: The patient indicates having attained 100% relief of the pain for the duration of local anesthetic which has continued and is ongoing in the area of the left SI  joint. Function: [name] reports improvement in function ROM: [name] reports improvement in ROM  H&P (Pre-op Assessment):  [name] is a 33 y.o. (year old), female patient, seen today for interventional treatment. She  has a past surgical history that includes Wisdom tooth extraction (2012). Ms. Payment has a current medication list which includes the following prescription(s): hydrocodone -acetaminophen , [START ON 03/26/2024] hydrocodone -acetaminophen , aspirin-acetaminophen -caffeine , botulinum toxin type a , budesonide -formoterol , bupropion , buspirone , ajovy , gabapentin , hydrocortisone , levonorgestrel , lubiprostone , phentermine , sumatriptan , ubrelvy , and [DISCONTINUED] azelastine , and the following Facility-Administered Medications: fremanezumab -vfrm. Her primarily concern today is the Back Pain (right)  Initial Vital Signs:  Pulse/HCG Rate: 76ECG Heart Rate: 80 (nsr) Temp: 98.1 F (36.7 C) Resp: 17 BP: 115/73 SpO2: 99 %  BMI: Estimated body mass index is 29.65 kg/m as calculated from the following:   Height as of this encounter: 5' 8 (1.727 m).   Weight as of this encounter: 195 lb (88.5 kg).  Risk Assessment: Allergies: Reviewed. She is allergic to propranolol .  Allergy Precautions: None required Coagulopathies: Reviewed. None identified.  Blood-thinner therapy: None at this time Active Infection(s): Reviewed. None identified. [name] is afebrile  Site Confirmation: Ms. Delancey was asked to confirm the procedure and laterality before marking the site Procedure checklist: Completed Consent: Before the procedure and under the influence of no sedative(s), amnesic(s), or anxiolytics, the patient was informed of the treatment options, risks and possible complications. To fulfill our ethical and legal obligations, as recommended by the American Medical Association's Code of Ethics, I have informed the patient of my clinical impression; the nature and purpose of the treatment  or procedure; the risks, benefits, and possible complications of the intervention; the alternatives, including doing nothing; the risk(s) and benefit(s) of the alternative treatment(s) or procedure(s); and the risk(s) and benefit(s) of doing nothing. The patient was provided information about the general risks and possible complications associated with the procedure. These may include, but are not limited to: failure to achieve desired goals, infection, bleeding, organ or nerve damage, allergic reactions, paralysis, and death. In addition, the patient was informed of those risks and complications associated to the procedure, such as failure to decrease pain; infection; bleeding; organ or nerve damage with subsequent damage to sensory, motor, and/or autonomic systems, resulting in permanent pain, numbness, and/or weakness of one or several areas of the body; allergic reactions; (i.e.: anaphylactic reaction); and/or death. Furthermore, the patient was informed of those risks and complications associated with the medications. These include, but are not limited to: allergic reactions (i.e.: anaphylactic or anaphylactoid reaction(s)); adrenal axis suppression; blood sugar elevation that in diabetics may result in ketoacidosis or comma; water retention that in patients with history of congestive heart failure may result in shortness of breath, pulmonary edema, and decompensation with resultant heart failure; weight gain; swelling or edema; medication-induced neural toxicity; particulate matter embolism and blood vessel occlusion with resultant organ, and/or nervous system infarction; and/or aseptic necrosis of one or more joints. Finally, the patient was informed that Medicine is not an exact science; therefore, there is also the possibility of unforeseen or unpredictable risks and/or possible complications that may result in a catastrophic outcome. The patient indicated having understood very clearly. We have given  the patient no guarantees and we have made no promises. Enough time was given to the patient to ask questions, all of which were answered to the patient's satisfaction. Ms. Corrow has indicated that she wanted to continue with the procedure. Attestation: I, the ordering provider, attest that I have discussed with the patient the benefits, risks, side-effects, alternatives, likelihood of achieving goals, and potential problems during recovery  for the procedure that I have provided informed consent. Date  Time: 03/19/2024  9:08 AM  Pre-Procedure Preparation:  Monitoring: As per clinic protocol. Respiration, ETCO2, SpO2, BP, heart rate and rhythm monitor placed and checked for adequate function Safety Precautions: Patient was assessed for positional comfort and pressure points before starting the procedure. Time-out: I initiated and conducted the Time-out before starting the procedure, as per protocol. The patient was asked to participate by confirming the accuracy of the Time Out information. Verification of the correct person, site, and procedure were performed and confirmed by me, the nursing staff, and the patient. Time-out conducted as per Joint Commission's Universal Protocol (UP.01.01.01). Time: 1000 Start Time: 1000 hrs.  Description/Narrative of Procedure:          Start Time: 1000 hrs.  Target: L5, S1, S2, & S3 Medial Branch Level(s), at the posterior Sacroiliac Joint Region: Posterolateral Lumbosacral Spine (PSIS area) Approach: Percutaneous   Rationale (medical necessity): procedure needed and proper for the diagnosis and/or treatment of the patient's medical symptoms and needs. Procedural Technique Safety Precautions: Aspiration looking for blood return was conducted prior to all injections. At no point did we inject any substances, as a needle was being advanced. No attempts were made at seeking any paresthesias. Safe injection practices and needle disposal techniques used.  Medications properly checked for expiration dates. SDV (single dose vial) medications used. Local Anesthesia: Protocol guidelines were followed. The patient was positioned over the fluoroscopy table. The area was prepped in the usual manner. The time-out was completed. The target area was identified using fluoroscopy. A 12-in long, straight, sterile hemostat was used with fluoroscopic guidance to locate the targets for each level blocked. Once located, the skin was marked with an approved surgical skin marker. Once all sites were marked, the skin (epidermis, dermis, and hypodermis), as well as deeper tissues (fat, connective tissue and muscle) were infiltrated with a small amount of a short-acting local anesthetic, loaded on a 10cc syringe with a 25G, 1.5-in  Needle. An appropriate amount of time was allowed for local anesthetics to take effect before proceeding to the next step.  Technical explanation of process:  Radiofrequency Ablation (RFA) L5 Medial Branch Nerve RFA: The target area for the L5 medial branch is at the junction of the postero-lateral aspect of the superior articular process of S1 and the superior, posterior, and medial edge of the sacral ala. Under fluoroscopic guidance, a Radiofrequency needle was inserted until contact was made with os over the superior postero-lateral aspect of the pedicular shadow (target area). Sensory and motor testing was conducted to properly adjust the position of the needle. Once satisfactory placement of the needle was achieved, the numbing solution was slowly injected after negative aspiration for blood. 2.0 mL of the nerve block solution was injected without difficulty or complication. After waiting for at least 3 minutes, the ablation was performed. Once completed, the needle was removed intact. S1 Primary Dorsal Rami and Lateral Branch Nerve RFA: The target area for the S1 medial branch is located inferior to the junction of the S1 superior articular process  and the L5 inferior articular process, posterior, inferior, and lateral to the 6 o'clock position of the L5-S1 facet joint, just superior to the S1 posterior foramen. Under fluoroscopic guidance, the Radiofrequency needle was advanced until contact was made with os over the Target area. Sensory and motor testing was conducted to properly adjust the position of the needle. Once satisfactory placement of the needle was achieved, the numbing  solution was slowly injected after negative aspiration for blood. 2.0 mL of the nerve block solution was injected without difficulty or complication. After waiting for at least 3 minutes, the ablation was performed. Once completed, the needle was removed intact. S2 Primary Dorsal Rami and Lateral Branch Nerve RFA: The target area for the S2 medial branch is at the posterior superior lateral of the S2 posterior neural foramen. Under fluoroscopic guidance, the Radiofrequency needle was advanced until contact was made with os over the Target area. Sensory and motor testing was conducted to properly adjust the position of the needle. Once satisfactory placement of the needle was achieved, the numbing solution was slowly injected after negative aspiration for blood. 2.0 mL of the nerve block solution was injected without difficulty or complication. After waiting for at least 3 minutes, the ablation was performed. Once completed, the needle was removed intact. S3 Primary Dorsal Rami and Lateral Branch Nerve RFA: The target area for the S3 medial branch is at the posterior superior lateral of the S3 posterior neural foramen. Under fluoroscopic guidance, the Radiofrequency needle was advanced until contact was made with os over the Target area. Sensory and motor testing was conducted to properly adjust the position of the needle. Once satisfactory placement of the needle was achieved, the numbing solution was slowly injected after negative aspiration for blood. 2.0 mL of the nerve block  solution was injected without difficulty or complication. After waiting for at least 3 minutes, the ablation was performed. Once completed, the needle was removed intact. Radiofrequency lesioning (ablation):  Radiofrequency Generator: Medtronic AccurianTM AG 1000 RF Generator Sensory Stimulation Parameters: 50 Hz was used to locate & identify the nerve, making sure that the needle was positioned such that there was no sensory stimulation below 0.3 V or above 0.7 V. Motor Stimulation Parameters: 2 Hz was used to evaluate the motor component. Care was taken not to lesion any nerves that demonstrated motor stimulation of the lower extremities at an output of less than 2.5 times that of the sensory threshold, or a maximum of 2.0 V. Lesioning Technique Parameters: Standard Radiofrequency settings. (Not bipolar or pulsed.) Temperature Settings: 80 degrees C Lesioning time: 60 seconds Stationary intra-operative compliance: Compliant Materials & Medications: Needle(s) (Electrode/Cannula) Type: Teflon-coated, curved tip, Radiofrequency needle(s) Gauge: 22G Length: 10cm Numbing solution: 0.2% PF-Ropivacaine  + Triamcinolone  (40 mg/mL) diluted to a final concentration of 4 mg of Triamcinolone /mL of Ropivacaine  The unused portion of the solution was discarded in the proper designated containers.   Once the entire procedure was completed, the treated area was cleaned, making sure to leave some of the prepping solution back to take advantage of its long term bactericidal properties. Intra-operative Compliance: Compliant    Vitals:   03/19/24 1029 03/19/24 1035 03/19/24 1045 03/19/24 1053  BP: 112/82 114/70 111/86 109/72  Pulse:      Resp: 16 19 (!) 21 16  Temp:  (!) 97 F (36.1 C)  (!) 97 F (36.1 C)  TempSrc:  Temporal  Temporal  SpO2: 100% 95% 100% 98%  Weight:      Height:        End Time: 1028 hrs.  Imaging Guidance (Non-Spinal):          Type of Imaging Technique: Fluoroscopy Guidance  (Non-Spinal) Indication(s): Fluoroscopy guidance for needle placement to enhance accuracy in procedures requiring precise needle localization for targeted delivery of medication in or near specific anatomical locations not easily accessible without such real-time imaging assistance. Exposure Time: Please see nurses  notes. Contrast: Before injecting any contrast, we confirmed that the patient did not have an allergy to iodine, shellfish, or radiological contrast. Once satisfactory needle placement was completed at the desired level, radiological contrast was injected. Contrast injected under live fluoroscopy. No contrast complications. See chart for type and volume of contrast used. Fluoroscopic Guidance: I was personally present during the use of fluoroscopy. Tunnel Vision Technique used to obtain the best possible view of the target area. Parallax error corrected before commencing the procedure. Direction-depth-direction technique used to introduce the needle under continuous pulsed fluoroscopy. Once target was reached, antero-posterior, oblique, and lateral fluoroscopic projection used confirm needle placement in all planes. Images permanently stored in EMR. Interpretation: I personally interpreted the imaging intraoperatively. Adequate needle placement confirmed in multiple planes. Appropriate spread of contrast into desired area was observed. No evidence of afferent or efferent intravascular uptake. Permanent images saved into the patient's record.  Antibiotic Prophylaxis:   Anti-infectives (From admission, onward)    None      Indication(s): None identified  Post-operative Assessment:  Post-procedure Vital Signs:  Pulse/HCG Rate: 6770 Temp: (!) 97 F (36.1 C) Resp: 16 BP: 109/72 SpO2: 98 %  EBL: None  Complications: No immediate post-treatment complications observed by team, or reported by patient.  Note: The patient tolerated the entire procedure well. A repeat set of vitals  were taken after the procedure and the patient was kept under observation following institutional policy, for this type of procedure. Post-procedural neurological assessment was performed, showing return to baseline, prior to discharge. The patient was provided with post-procedure discharge instructions, including a section on how to identify potential problems. Should any problems arise concerning this procedure, the patient was given instructions to immediately contact us , at any time, without hesitation. In any case, we plan to contact the patient by telephone for a follow-up status report regarding this interventional procedure.  Comments:  No additional relevant information.  Plan of Care (POC)  Orders:  Orders Placed This Encounter  Procedures   Radiofrequency Sacroiliac Joint    Scheduling Instructions:     Side(s): Right-sided     Level(s): L4, L5, S1, S2, & S3 Medial Branch Nerve(s)     Sedation: With Sedation.     Date: 03/19/2024     Procedure: Sacroiliac joint RFA   DG PAIN [facility] C-ARM 1-60 MIN NO REPORT    Intraoperative interpretation by procedural physician at [facility].    Standing Status:   Standing    Number of Occurrences:   1    Reason for exam::   Assistance in needle guidance and placement for procedures requiring needle placement in or near specific anatomical locations not easily accessible without such assistance.   Nursing Instructions:    Please complete this patient's postprocedure evaluation.    Scheduling Instructions:     Please complete this patient's postprocedure evaluation.   Informed Consent Details: Physician/Practitioner Attestation; Transcribe to consent form and obtain patient signature    Nursing Order: Transcribe to consent form and obtain patient signature. Note: Always confirm laterality of pain with Ms. Sander, before procedure.    Physician/Practitioner attestation of informed consent for procedure/surgical case:   I, the  physician/practitioner, attest that I have discussed with the patient the benefits, risks, side effects, alternatives, likelihood of achieving goals and potential problems during recovery for the procedure that I have provided informed consent.    Procedure:   Sacroiliac joint radiofrequency ablation    Physician/Practitioner performing the procedure:   [place]  RONAL Como, MD    Indication/Reason:   Chronic sacroiliac joint pain   Provide equipment / supplies at bedside    Procedure tray: Radiofrequency Tray Additional material: Large hemostat (x1); Small hemostat (x1); Towels (x8); 4x4 sterile sponge pack (x1) Needle type: Teflon-coated Radiofrequency Needle (Disposable  single use) Size: Regular Quantity: 5    Standing Status:   Standing    Number of Occurrences:   1    Specify:   Radiofrequency Tray   Saline lock IV    Have LR [phone] mL available and administer at 125 mL/hr if patient becomes hypotensive.    Standing Status:   Standing    Number of Occurrences:   1     Opioid Analgesic(s): No chronic opioid analgesics therapy prescribed by our practice. hydrocodone /APAP 10/325, 1 tab p.o. 4 times daily  MME/day: 40 mg/day    Medications ordered for procedure: Meds ordered this encounter  Medications   lidocaine  (XYLOCAINE ) 2 % (with pres) injection 400 mg   pentafluoroprop-tetrafluoroeth (GEBAUERS) aerosol   midazolam  (VERSED ) 5 MG/5ML injection 0.5-2 mg    Make sure Flumazenil is available in the pyxis when using this medication. If oversedation occurs, administer 0.2 mg IV over 15 sec. If after 45 sec no response, administer 0.2 mg again over 1 min; may repeat at 1 min intervals; not to exceed 4 doses (1 mg)   fentaNYL  (SUBLIMAZE ) injection 25-50 mcg    Make sure Narcan  is available in the pyxis when using this medication. In the event of respiratory depression (RR< 8/min): Titrate NARCAN  (naloxone ) in increments of 0.1 to 0.2 mg IV at 2-3 minute intervals, until desired  degree of reversal.   ropivacaine  (PF) 2 mg/mL (0.2%) (NAROPIN ) injection 9 mL   triamcinolone  acetonide (KENALOG -40) injection 40 mg   HYDROcodone -acetaminophen  (NORCO/VICODIN) 5-325 MG tablet    Sig: Take 1 tablet by mouth every 6 (six) hours as needed for up to 7 days for severe pain (pain score 7-10). Must last 7 days.    Dispense:  28 tablet    Refill:  0    For acute post-operative pain. Not to be refilled. Must last 7 days.   HYDROcodone -acetaminophen  (NORCO/VICODIN) 5-325 MG tablet    Sig: Take 1 tablet by mouth every 6 (six) hours as needed for up to 7 days for severe pain (pain score 7-10). Must last 7 days.    Dispense:  28 tablet    Refill:  0    For acute post-operative pain. Not to be refilled.  Must last 7 days.   Medications administered: We administered lidocaine , pentafluoroprop-tetrafluoroeth, midazolam , fentaNYL , ropivacaine  (PF) 2 mg/mL (0.2%), and triamcinolone  acetonide.  See the medical record for exact dosing, route, and time of administration.    Interventional Therapies  Risk Factors  Considerations  Medical Comorbidities:     Planned  Pending:   Therapeutic right SI RFA #1 (03/19/2024)    Under consideration:   Therapeutic bilateral SI RFA #1 (starting with the left side)    Completed:   Therapeutic left SI RFA x1 (02/29/2024) (100/100/100/100)  Diagnostic bilateral SI joint block x2 (02/01/2024) (100/100/75/75)  Diagnostic/therapeutic lumbar facet MBB x1 (11/28/2023) (75/75/80/R:80L:0)    Therapeutic  Palliative (PRN) options:   None established   Completed by other providers:   Diagnostic/therapeutic left interlaminar L5-S1 LESI x2 (05/28/2020) by Elsie Shoulder, MD [facility])       Follow-up plan:   Return in about 6 weeks (around 04/30/2024) for (Face2F), (PPE).  Recent Visits Date Type Provider Dept  02/29/24 Procedure visit Tanya Glisson, MD [facility]  02/14/24 Office Visit Tanya Glisson, MD [facility]  02/01/24 Procedure visit Tanya Glisson, MD [facility]  01/17/24 Office Visit Tanya Glisson, MD [facility]  01/02/24 Procedure visit Tanya Glisson, MD [facility]  Showing recent visits within past 90 days and meeting all other requirements Today's Visits Date Type Provider Dept  03/19/24 Procedure visit Tanya Glisson, MD [facility]  Showing today's visits and meeting all other requirements Future Appointments Date Type Provider Dept  04/30/24 Appointment Tanya Glisson, MD [facility]  Showing future appointments within next 90 days and meeting all other requirements   Disposition: Discharge home  Discharge (Date  Time): 03/19/2024; 1056 hrs.   Primary Care Physician: Cannady, Jolene T, NP Location: [facility] Note by: Glisson DELENA Tanya, MD (TTS technology used. I apologize for any typographical errors that were not detected and corrected.) Date: 03/19/2024; Time: 11:00 AM  Disclaimer:  Medicine is not an Visual merchandiser. The only guarantee in medicine is that nothing is guaranteed. It is important to note that the decision to proceed with this intervention was based on the information collected from the patient. The Data and conclusions were drawn from the patient's questionnaire, the interview, and the physical examination. Because the information was provided in large part by the patient, it cannot be guaranteed that it has not been purposely or unconsciously manipulated. Every effort has been made to obtain as much relevant data as possible for this evaluation. It is important to note that the conclusions that lead to this procedure are derived in large part from the available data. Always take into account that the treatment will also be dependent on availability of resources and existing treatment guidelines, considered by other Pain Management Practitioners as being  common knowledge and practice, at the time of the intervention. For Medico-Legal purposes, it is also important to point out that variation in procedural techniques and pharmacological choices are the acceptable norm. The indications, contraindications, technique, and results of the above procedure should only be interpreted and judged by a Board-Certified Interventional Pain Specialist with extensive familiarity and expertise in the same exact procedure and technique.

## 2024-03-19 NOTE — Progress Notes (Signed)
 Safety precautions to be maintained throughout the outpatient stay will include: orient to surroundings, keep bed in low position, maintain call bell within reach at all times, provide assistance with transfer out of bed and ambulation.

## 2024-03-20 ENCOUNTER — Telehealth: Payer: Self-pay

## 2024-03-20 NOTE — Telephone Encounter (Signed)
 Post procedure follow up.  LM

## 2024-03-22 ENCOUNTER — Encounter: Payer: Self-pay | Admitting: Advanced Practice Midwife

## 2024-03-26 ENCOUNTER — Telehealth: Payer: Self-pay | Admitting: Neurology

## 2024-03-26 NOTE — Telephone Encounter (Signed)
 Submitted auth renewal request via CMM, status is pending. Key: [id number]

## 2024-04-01 ENCOUNTER — Other Ambulatory Visit: Payer: Self-pay

## 2024-04-01 DIAGNOSIS — E66811 Obesity, class 1: Secondary | ICD-10-CM | POA: Diagnosis not present

## 2024-04-01 MED ORDER — PHENTERMINE HCL 37.5 MG PO TABS
37.5000 mg | ORAL_TABLET | Freq: Every morning | ORAL | 0 refills | Status: AC
  Filled 2024-04-01: qty 30, 30d supply, fill #0

## 2024-04-01 NOTE — Telephone Encounter (Signed)
 Received approval, pt will continue to fill through [facility].  Auth#: [id number] (03/28/24-03/27/25)

## 2024-04-09 DIAGNOSIS — D2261 Melanocytic nevi of right upper limb, including shoulder: Secondary | ICD-10-CM | POA: Diagnosis not present

## 2024-04-09 DIAGNOSIS — D2262 Melanocytic nevi of left upper limb, including shoulder: Secondary | ICD-10-CM | POA: Diagnosis not present

## 2024-04-09 DIAGNOSIS — D2271 Melanocytic nevi of right lower limb, including hip: Secondary | ICD-10-CM | POA: Diagnosis not present

## 2024-04-09 DIAGNOSIS — D2272 Melanocytic nevi of left lower limb, including hip: Secondary | ICD-10-CM | POA: Diagnosis not present

## 2024-04-09 DIAGNOSIS — L814 Other melanin hyperpigmentation: Secondary | ICD-10-CM | POA: Diagnosis not present

## 2024-04-09 DIAGNOSIS — D225 Melanocytic nevi of trunk: Secondary | ICD-10-CM | POA: Diagnosis not present

## 2024-04-09 DIAGNOSIS — Z808 Family history of malignant neoplasm of other organs or systems: Secondary | ICD-10-CM | POA: Diagnosis not present

## 2024-04-09 DIAGNOSIS — D485 Neoplasm of uncertain behavior of skin: Secondary | ICD-10-CM | POA: Diagnosis not present

## 2024-04-09 DIAGNOSIS — L570 Actinic keratosis: Secondary | ICD-10-CM | POA: Diagnosis not present

## 2024-04-09 MED FILL — Sumatriptan Succinate Tab 100 MG: ORAL | 15 days supply | Qty: 9 | Fill #2 | Status: CN

## 2024-04-16 ENCOUNTER — Other Ambulatory Visit: Payer: Self-pay

## 2024-04-16 DIAGNOSIS — L570 Actinic keratosis: Secondary | ICD-10-CM | POA: Diagnosis not present

## 2024-04-16 NOTE — Progress Notes (Addendum)
 Specialty Pharmacy Refill Coordination Note  Jennifer Morse is a 33 y.o. female assessed today regarding refills of clinic administered specialty medication(s) OnabotulinumtoxinA  (BOTOX )  Spoke with Metta from [facility] requested Delivery   Delivery date: 04/18/24   Verified address: [facility] [address] [place]   Medication will be filled on 08.13.25.

## 2024-04-22 ENCOUNTER — Other Ambulatory Visit: Payer: Self-pay

## 2024-04-22 MED FILL — Sumatriptan Succinate Tab 100 MG: ORAL | 15 days supply | Qty: 9 | Fill #2 | Status: AC

## 2024-04-23 ENCOUNTER — Ambulatory Visit: Admitting: Adult Health

## 2024-04-23 ENCOUNTER — Ambulatory Visit (INDEPENDENT_AMBULATORY_CARE_PROVIDER_SITE_OTHER): Admitting: Adult Health

## 2024-04-23 VITALS — BP 116/76 | HR 85

## 2024-04-23 DIAGNOSIS — G43009 Migraine without aura, not intractable, without status migrainosus: Secondary | ICD-10-CM

## 2024-04-23 DIAGNOSIS — G43709 Chronic migraine without aura, not intractable, without status migrainosus: Secondary | ICD-10-CM

## 2024-04-23 DIAGNOSIS — G43711 Chronic migraine without aura, intractable, with status migrainosus: Secondary | ICD-10-CM

## 2024-04-23 MED ORDER — ONABOTULINUMTOXINA 200 UNITS IJ SOLR
155.0000 [IU] | Freq: Once | INTRAMUSCULAR | Status: AC
Start: 2024-04-23 — End: 2024-04-23
  Administered 2024-04-23: 155 [IU] via INTRAMUSCULAR

## 2024-04-23 NOTE — Progress Notes (Signed)
 Botox - 200 units x 1 vial Lot: [id number] Expiration: 08/04/2026 NDC: 9976-6078-97  Bacteriostatic 0.9% Sodium Chloride - 4 mL  Lot: [id number] Expiration: 07/05/2025 NDC: 9590-8033-97  Dx: g43.009 S/P Witnessed by Catheryn Alexander, RN

## 2024-04-23 NOTE — Progress Notes (Signed)
 Update 04/23/2024 JM: Patient is being seen for repeat Botox  injection.  Prior injection 01/30/2024 by Lauraine, NP.  She reports continued benefit with Botox  with >50% migraine reduction.  Has been experiencing some increased migraines recently but believes more due to weather and stress (35 yo son being worked up for possible autoimmune).  She does report benefit with Ubrelvy . Still has Imitrex  as needed.  She has been limiting use of Excedrin.  She is no longer on Ajovy  as she does not feel this is beneficial. Tolerated procedure well today.  Return in 3 months for repeat injection.       Consent Form Botulism Toxin Injection For Chronic Migraine    Reviewed orally with patient, additionally signature is on file:  Botulism toxin has been approved by the Federal drug administration for treatment of chronic migraine. Botulism toxin does not cure chronic migraine and it may not be effective in some patients.  The administration of botulism toxin is accomplished by injecting a small amount of toxin into the muscles of the neck and head. Dosage must be titrated for each individual. Any benefits resulting from botulism toxin tend to wear off after 3 months with a repeat injection required if benefit is to be maintained. Injections are usually done every 3-4 months with maximum effect peak achieved by about 2 or 3 weeks. Botulism toxin is expensive and you should be sure of what costs you will incur resulting from the injection.  The side effects of botulism toxin use for chronic migraine may include:   -Transient, and usually mild, facial weakness with facial injections  -Transient, and usually mild, head or neck weakness with head/neck injections  -Reduction or loss of forehead facial animation due to forehead muscle weakness  -Eyelid drooping  -Dry eye  -Pain at the site of injection or bruising at the site of injection  -Double vision  -Potential unknown long term  risks   Contraindications: You should not have Botox  if you are pregnant, nursing, allergic to albumin, have an infection, skin condition, or muscle weakness at the site of the injection, or have myasthenia gravis, Lambert-Eaton syndrome, or ALS.  It is also possible that as with any injection, there may be an allergic reaction or no effect from the medication. Reduced effectiveness after repeated injections is sometimes seen and rarely infection at the injection site may occur. All care will be taken to prevent these side effects. If therapy is given over a long time, atrophy and wasting in the muscle injected may occur. Occasionally the patient's become refractory to treatment because they develop antibodies to the toxin. In this event, therapy needs to be modified.  I have read the above information and consent to the administration of botulism toxin.    BOTOX  PROCEDURE NOTE FOR MIGRAINE HEADACHE  Contraindications and precautions discussed with patient(above). Aseptic procedure was observed and patient tolerated procedure. Procedure performed by Harlene Bogaert, AGNP-BC.   The condition has existed for more than 6 months, and pt does not have a diagnosis of ALS, Myasthenia Gravis or Lambert-Eaton Syndrome.  Risks and benefits of injections discussed and pt agrees to proceed with the procedure.  Written consent obtained  These injections are medically necessary. Pt  receives good benefits from these injections. These injections do not cause sedations or hallucinations which the oral therapies may cause.   Description of procedure:  The patient was placed in a sitting position. The standard protocol was used for Botox  as follows, with 5  units of Botox  injected at each site:  -Procerus muscle, midline injection  -Corrugator muscle, bilateral injection  -Frontalis muscle, bilateral injection, with 2 sites each side, medial injection was performed in the upper one third of the frontalis  muscle, in the region vertical from the medial inferior edge of the superior orbital rim. The lateral injection was again in the upper one third of the forehead vertically above the lateral limbus of the cornea, 1.5 cm lateral to the medial injection site.  -Temporalis muscle injection, 4 sites, bilaterally. The first injection was 3 cm above the tragus of the ear, second injection site was 1.5 cm to 3 cm up from the first injection site in line with the tragus of the ear. The third injection site was 1.5-3 cm forward between the first 2 injection sites. The fourth injection site was 1.5 cm posterior to the second injection site. 5th site laterally in the temporalis  muscleat the level of the outer canthus.  -Occipitalis muscle injection, 3 sites, bilaterally. The first injection was done one half way between the occipital protuberance and the tip of the mastoid process behind the ear. The second injection site was done lateral and superior to the first, 1 fingerbreadth from the first injection. The third injection site was 1 fingerbreadth superiorly and medially from the first injection site.  -Cervical paraspinal muscle injection, 2 sites, bilaterally. The first injection site was 1 cm from the midline of the cervical spine, 3 cm inferior to the lower border of the occipital protuberance. The second injection site was 1.5 cm superiorly and laterally to the first injection site.  -Trapezius muscle injection was performed at 3 sites, bilaterally. The first injection site was in the upper trapezius muscle halfway between the inflection point of the neck, and the acromion. The second injection site was one half way between the acromion and the first injection site. The third injection was done between the first injection site and the inflection point of the neck.    A total of 200 units of Botox  was prepared, 155 units of Botox  was injected as documented above, any Botox  not injected was wasted. The  patient tolerated the procedure well, there were no complications of the above procedure.   Harlene Bogaert, AGNP-BC  [facility] [address] [place], [place] [address]  Phone [phone] Fax [phone] Note: This document was prepared with digital dictation and possible smart phrase technology. Any transcriptional errors that result from this process are unintentional.

## 2024-04-30 ENCOUNTER — Ambulatory Visit: Attending: Pain Medicine | Admitting: Pain Medicine

## 2024-04-30 ENCOUNTER — Encounter: Payer: Self-pay | Admitting: Pain Medicine

## 2024-04-30 VITALS — BP 117/78 | HR 95 | Temp 97.3°F | Resp 16 | Ht 68.0 in | Wt 194.0 lb

## 2024-04-30 DIAGNOSIS — M545 Low back pain, unspecified: Secondary | ICD-10-CM | POA: Diagnosis not present

## 2024-04-30 DIAGNOSIS — G8929 Other chronic pain: Secondary | ICD-10-CM | POA: Diagnosis not present

## 2024-04-30 DIAGNOSIS — Z09 Encounter for follow-up examination after completed treatment for conditions other than malignant neoplasm: Secondary | ICD-10-CM | POA: Insufficient documentation

## 2024-04-30 DIAGNOSIS — M533 Sacrococcygeal disorders, not elsewhere classified: Secondary | ICD-10-CM | POA: Insufficient documentation

## 2024-04-30 NOTE — Progress Notes (Signed)
 Safety precautions to be maintained throughout the outpatient stay will include: orient to surroundings, keep bed in low position, maintain call bell within reach at all times, provide assistance with transfer out of bed and ambulation.

## 2024-04-30 NOTE — Patient Instructions (Signed)
 ______________________________________________________________________    TENS (Device can be purchased online, without prescription. Search: TENS 7000.) Transcutaneous electrical nerve stimulation (TENS) is a method of pain relief that involves the use of mild electrical stimulation. A TENS machine is a small, battery-operated device that has leads connected to sticky pads called electrodes. Available at [employer]. (Estimated price as of July 10th, 2025.)  (Estimated [employer] cost: $38.88) Rechargeable 9V batteries:  (Estimated [employer] cost: $12.98)  Larger Reusable 2 x 4 TENS Pads/Electrodes:  (Estimated [employer] cost: $9.99)  Total cost: $61.85    ELECTRODE PLACEMENT:   TENS UNIT SAFETY WARNING SHEET and INFORMATION INDICATIONS AND CONTRAINDICTIONS Read the operation manual before using the device. Freight forwarder ([place] ) restricts this device to sale by or on the order of a physician. Observe your physician's precise instructions and let him show you where to apply the electrodes. For a successful therapy, the correct application of the electrodes is an important factor. Carefully write down the settings your physician recommended. Indications for use This device is a prescription device and only for symptomatic relief of chronic intractable pain.  Contraindications:   Any electrode placement that applies current to the carotid sinus (neck) region.   Patients with implanted electronic devices (for example, a pacemaker) or metallic implants should not undertake.   Any electrode placement that causes current to flow transcerebrally (through the head). The use of unit whenever pain symptoms are undiagnosed, unit etiology is determined.   The use of TENS whenever pain syndromes are undiagnosed, until etiology is established.   WARNINGS AND PRECAUTIONS  Warnings:   The device must be kept out of reach of children.   The safety of device for use during pregnancy or delivery has not been established.    Do not place electrodes on front of the throat. This may result in spasms of the laryngeal and pharyngeal muscles.   Do not place the electrodes over the carotid nerve (side of neck below ear).   The device is not effective for pain of central origin (headaches).   The device may interfere with electronic monitoring equipment (such as ECG monitors and ECG alarms).   Electrodes should not be placed over the eyes, in the mouth, or internally.   These devices have no curative value.   TENS devices should be used only under the continued supervision of a physician.   TENS is a symptomatic treatment and as such suppresses the sensation of pain which would otherwise serve as a protective mechanism. Precautions/Adverse Reactions   Isolated cases of skin irritation may occur at the site of electrode placement following long-term application.   Stimulation should be stopped and electrodes removed until the cause of the irritation can be determined.   Effectiveness is highly dependent upon patient selection by a person qualified in the management of pain patients.   If the device treatment becomes ineffective or unpleasant, stimulation should be discontinued until reevaluation by a physician/clinician.   Always turn the device off before applying or removing electrodes.   Skin irritation and electrode burns are potential adverse reactions.  PURPOSE: A Transcutaneous Electrical Nerve Stimulator, or TENS, unit is designed to relieve post-operative, acute and chronic pain. It is used for pain caused by peripheral nerves and not central. TENS units are prescription-only devices.   OPERATION: TENS units work in a couple of ways. The first way they are thought to work is by a method called the [employer]. The [employer] states that our brains can only handle one  stimulus at a time. When you have chronic pain, this pain signal is constantly being sent to your brain and recognized as pain. When an electrical stimulus is added  to the area of pain the body feels this electrical stimulus, and since the brain can only handle one thing at a time, the pain is not transmitted to the brain. The second method thought to be part of TENS unit's success is by way of stimulating our own bodies to release their own natural painkillers. TENS units do not work for everyone and results may vary. Always follow the instructions and warnings in your user's manual.   USE: One of the most important tasks that must be performed is battery maintenance. If you are using a Engineering geologist, always fully charge it and fully deplete it before charging it again. These batteries can develop memories and by not performing this charging task correctly, your battery's life can be greatly diminished. If your battery does develop a memory you can help expand the memory by charging for 12 - 13 hours and then completely depleting the battery. Always prepare the skin before applying electrodes. Your skin should be clean and free of any lotions or creams. If you are using electrodes that use conductive gel, apply a small, even layer over the electrode. For carbon, self-adhesive electrodes, apply a drop of water to the electrodes before applying to the skin. The electrodes attach to the lead wires and then the TENS unit. Always grasp the connector and not the cord when inserting or removing. When making adjustments, always make sure the unit's channels (1 and 2) are in the OFF position. The actual settings should be recommended and prescribed by your physician. Medical equipment suppliers don'tset or instruct users as to user settings. When you are using the BURST mode, the unit delivers a series of quick pulses followed by a rest. This cycle repeats itself frequently. Always have channels OFF before changing modes.   For MODULATION mode, the stimulation automatically varies the width of the pulse.   For CONVENTIONAL mode, the stimulation is constant.  After the settings have been fine-tuned, set the timer to 30 or 60 minutes. Your physician should also prescribe the use time. When the lights become dim, it means your batteries should be replaced or recharged.   ACCESSORIES: The electrodes and lead wires can be obtained from your medical equipment supplier. Your medical equipment supplier can set up a recurring delivery to accommodate your needs. Electrodes should be replaced once a month and lead wires once every 6 months.  Video Tutorial [email or web address]://[email or web address]  ______________________________________________________________________     ______________________________________________________________________    OTC Supplements:   The following is a list of over-the-counter (OTC) supplements that have been found to have [employer]) studies suggesting that they may be of some benefits when used in moderation in some chronic pain-related conditions.  NOTE:  Always consult with your primary care provider and/or pharmacist before taking any OTC medications to make sure they will not interact with your current medications. Always use manufacturer's recommended dosage.  Supplement Possible benefit May be of benefit in treatment of   Turmeric/curcumin anti-inflammatory Joint and muscle aches and pain.  Glucosamine/chondroitin (triple strength) may slow loss of articular cartilage Joint pain.  Vitamin D -3* may suppress release of chemicals associated with inflammation. Increases tolerance to pain. Joint and muscle aches and pain.   Moringa(+) anti-inflammatory with mild analgesic effects Joint and muscle aches and pain.  Melatonin(+) Helps  reset sleep cycle. Insomnia.  Vitamin B-12* may help keep nerves and blood cells healthy as well as maintaining function of nervous system Nerve pain (Burning pain)  Alpha-Lipoic-Acid (ALA)* antioxidant that may help with nerve health, pain, and blocking the  activation of some inflammatory chemicals Diabetic neuropathy and metabolic syndrome  superoxide dismutase (SOD)** Currently being reviewed.   Tiger Balm Currently being reviewed.   hydrolyzed collagen peptides* Currently being reviewed.  Collagen supplementation may increases bone strength, density, and mass; may improve joint stiffness/mobility, and functionality; and may reduce joint pain. Possible chondroprotective effects. May help with protection of joint health.   Methylsulfonylmethane (MSM)* Currently being reviewed.   CBD(+) Currently being reviewed.   Delta-8 THC(+) Currently being reviewed.   *  Generally Recognized As Safe (GRAS) approved substance.-FDA ([employer]) ** Possibly Safe, but not considered Generally Recognized As Safe (Not GRAS) by the [place]  Food and Drug Administration (FDA) as a food additive. (+) Not considered Generally Recognized As Safe (Not GRAS) by the [place]  Food and Drug Administration (FDA) as a food additive.  ______________________________________________________________________

## 2024-04-30 NOTE — Progress Notes (Signed)
 PROVIDER NOTE: Interpretation of information contained herein should be left to medically-trained personnel. Specific patient instructions are provided elsewhere under Patient Instructions section of medical record. This document was created in part using AI and STT-dictation technology, any transcriptional errors that may result from this process are unintentional.  Patient: Jennifer Morse  Service: E/M   PCP: Valerio Melanie DASEN, NP  DOB: 12-Sep-1990  DOS: 04/30/2024  Provider: Eric DELENA Como, MD  MRN: [id number]  Delivery: Face-to-face  Specialty: Interventional Pain Management  Type: Established Patient  Setting: Ambulatory outpatient facility  Specialty designation: 09  Referring Prov.: Valerio Melanie DASEN, NP  Location: Outpatient office facility       History of present illness (HPI) [name], a 33 y.o. 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 Ms. Dossett primary complain today is Back Pain (low)  Pertinent problems: Ms. Higginbotham has Migraine; Acute on chronic low back pain (Bilateral) (L>R); Chronic pain syndrome; Abnormal MRI, lumbar spine (11/06/2023); Lumbosacral intervertebral disc herniation (L5-S1); Lumbar disc herniation (L3-4); Lumbar intervertebral disc protrusion (L3-4); Lumbar facet hypertrophy; Chronic low back pain (1ry area of Pain) (Bilateral) (L>R) w/o sciatica; Vertebrogenic low back pain; Low back pain of over 3 months duration; Mechanical low back pain; Multifactorial low back pain; Lumbar facet joint pain (Bilateral) (R>L); Chronic hip pain (2ry area of Pain) (Bilateral) (R>L); Grade 1 Lumbar Retrolisthesis of L3/L4 & L5/S1; DDD (degenerative disc disease), lumbosacral (L5-S1) w/ LBP; Impaired range of motion of hip (Right); Spondylosis without myelopathy or radiculopathy, lumbosacral region; Lumbar facet joint syndrome; Osteoarthritis of lumbar spine; Osteoarthritis of facet joint of lumbar spine;  Sacroiliac joint dysfunction of sides (Bilateral) (L>R); Chronic sacroiliac joint pain (Bilateral) (L>R); Disorder of sacroiliac joints (Bilateral) (L>R); Somatic dysfunction of sacroiliac joints (Bilateral) (L>R); and Other spondylosis, sacral and sacrococcygeal region on their pertinent problem list.  Pain Assessment: Severity of Chronic pain is reported as a 3 /10. Location: Back Lower/denies. Onset: More than a month ago. Quality: Aching, Burning, Constant. Timing: Constant. Modifying factor(s): procedures. Vitals:  height is 5' 8 (1.727 m) and weight is 194 lb (88 kg). Her temperature is 97.3 F (36.3 C) (abnormal). Her blood pressure is 117/78 and her pulse is 95. Her respiration is 16 and oxygen saturation is 100%.  BMI: Estimated body mass index is 29.5 kg/m as calculated from the following:   Height as of this encounter: 5' 8 (1.727 m).   Weight as of this encounter: 194 lb (88 kg).  Last encounter: 02/14/2024. Last procedure: 03/19/2024.  Reason for encounter: post-procedure evaluation and assessment.   Discussed the use of AI scribe software for clinical note transcription with the patient, who gave verbal consent to proceed.  History of Present Illness   Jennifer Morse is a 33 year old female who presents for follow-up after bilateral radiofrequency ablation for pain management.  She underwent radiofrequency ablation on the left side on February 23, 2024, and on the right side on March 19, 2024. She achieved 100% pain relief during the local anesthetic duration, with ongoing relief of about 70% on the left side and 80% on the right side.  She experiences daily pain rated at 2 to 3 out of 10, which is tolerable compared to initial levels. Discomfort on the left side is attributed to prolonged sitting during travel.  Her current pain management regimen includes magnesium and ibuprofen  taken in the morning and evening. She has not yet tried using a  TENS unit.      Post-Procedure  Evaluation   Procedure: Sacroiliac joint Radiofrequency Ablation (RFA) #1  Laterality: Right  Level: L5, S1, S2, & S3 Medial Branch Level(s), at the posterior Sacroiliac Joint   Imaging: Fluoroscopic guidance Spinal ([id number]) Anesthesia: Local anesthesia (1-2% Lidocaine ) Anxiolysis: IV Versed  4.0 mg Sedation: Moderate Sedation Fentanyl  2.0 mL (100 mcg) DOS: 03/19/2024  Performed by: Eric DELENA Como, MD  Purpose: Therapeutic Indications: Pain in the lower back and hip area severe enough to impact quality of life or function. Indications: 1. Chronic sacroiliac joint pain (Bilateral) (L>R)   2. 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 Somatic dysfunction of sacroiliac joints (Bilateral) (L>R)   4. Other spondylosis, sacral and sacrococcygeal region   5. Multifactorial low back pain   6. Chronic low back pain (1ry area of Pain) (Bilateral) (L>R) w/o sciatica   7. Encounter for therapeutic procedure    Pain Score: Pre-procedure: 5 /10 Post-procedure: 0-No pain/10    Effectiveness:  Initial hour after procedure: 100 %. Subsequent 4-6 hours post-procedure: 100 %. Analgesia past initial 6 hours: 70 % (lasting a month). Ongoing improvement:  Analgesic: The patient indicates having attained 100% relief of the pain for the duration of local anesthetic followed by a decrease to an ongoing 80% improvement on the right side and 70% improvement on the left side. Function: Ms. Largent reports improvement in function ROM: Ms. Wilhelmsen reports improvement in ROM  Pharmacotherapy Assessment   Opioid Analgesic(s): No chronic opioid analgesics therapy prescribed by our practice. hydrocodone /APAP 10/325, 1 tab p.o. 4 times daily  MME/day: 40 mg/day   Monitoring: [place] PMP: PDMP not reviewed this encounter.       Pharmacotherapy: No side-effects or adverse reactions reported. Compliance: No problems identified. Effectiveness: Clinically acceptable.  Rebecka Wolm HERO, RN   04/30/2024  1:45 PM  Sign when Signing Visit Safety precautions to be maintained throughout the outpatient stay will include: orient to surroundings, keep bed in low position, maintain call bell within reach at all times, provide assistance with transfer out of bed and ambulation.    UDS:  Summary  Date Value Ref Range Status  11/15/2023 FINAL  Final    Comment:    ==================================================================== Compliance Drug Analysis, Ur ==================================================================== Test                             Result       Flag       Units  Drug Present and Declared for Prescription Verification   Bupropion                       PRESENT      EXPECTED   Hydroxybupropion               PRESENT      EXPECTED    Hydroxybupropion is an expected metabolite of bupropion .  Drug Present not Declared for Prescription Verification   Amphetamine                    899          UNEXPECTED ng/mg creat    Amphetamine is available as a schedule II prescription drug.    Desmethylcyclobenzaprine       PRESENT      UNEXPECTED    Desmethylcyclobenzaprine is an expected metabolite of    cyclobenzaprine .    Methocarbamol   PRESENT      UNEXPECTED  Drug Absent but Declared for Prescription Verification   Gabapentin                      Not Detected UNEXPECTED   Acetaminophen                   Not Detected UNEXPECTED    Acetaminophen , as indicated in the declared medication list, is not    always detected even when used as directed.    Salicylate                     Not Detected UNEXPECTED    Aspirin, as indicated in the declared medication list, is not always    detected even when used as directed.  ==================================================================== Test                      Result    Flag   Units      Ref Range   Creatinine              90               mg/dL       >=79 ==================================================================== Declared Medications:  The flagging and interpretation on this report are based on the  following declared medications.  Unexpected results may arise from  inaccuracies in the declared medications.   **Note: The testing scope of this panel includes these medications:   Bupropion  (Wellbutrin  XL)  Gabapentin  (Neurontin )   **Note: The testing scope of this panel does not include small to  moderate amounts of these reported medications:   Acetaminophen  (Excedrin)  Aspirin (Excedrin)   **Note: The testing scope of this panel does not include the  following reported medications:   Botulinum (Botox )  Budesonide  (Symbicort )  Buspirone  (Buspar )  Caffeine  (Excedrin)  Formoterol  (Symbicort )  Fremanezumab  (Ajovy )  Hydrocortisone   Levonorgestrel  (Mirena )  Lubiprostone  (Amitiza )  Rimegepant (Nurtec)  Sumatriptan  (Imitrex ) ==================================================================== For clinical consultation, please call [phone]. ====================================================================     No results found for: CBDTHCR No results found for: D8THCCBX No results found for: D9THCCBX  ROS  Constitutional: Denies any fever or chills Gastrointestinal: No reported hemesis, hematochezia, vomiting, or acute GI distress Musculoskeletal: Denies any acute onset joint swelling, redness, loss of ROM, or weakness Neurological: No reported episodes of acute onset apraxia, aphasia, dysarthria, agnosia, amnesia, paralysis, loss of coordination, or loss of consciousness  Medication Review  SUMAtriptan , Ubrogepant , aspirin-acetaminophen -caffeine , azelastine , botulinum toxin Type A , buPROPion , budesonide -formoterol , busPIRone , gabapentin , hydrocortisone , levonorgestrel , lubiprostone , and phentermine   History Review  Allergy: Ms. Cousar is allergic to propranolol . Drug: Ms. Coull  reports no  history of drug use. Alcohol:  reports that she does not currently use alcohol after a past usage of about 1.0 standard drink of alcohol per week. Tobacco:  reports that she quit smoking about 9 years ago. Her smoking use included cigarettes. She has never used smokeless tobacco. Social: Ms. Mccune  reports that she quit smoking about 9 years ago. Her smoking use included cigarettes. She has never used smokeless tobacco. She reports that she does not currently use alcohol after a past usage of about 1.0 standard drink of alcohol per week. She reports that she does not use drugs. Medical:  has a past medical history of Anxiety, BRCA negative (07/2021), Degenerative lumbar disc, Frequent headaches, Gestational thrombocytopenia (HCC), Lumbar herniated disc, Migraine with aura, intractable, with status migrainosus (1995), Oligomenorrhea, Postpartum depression,  and Thyroid  disease (08/11/17). Surgical: Ms. Devino  has a past surgical history that includes Wisdom tooth extraction (2012). Family: family history includes Arthritis in her maternal grandmother; Cancer in her maternal uncle and mother; Diabetes in her maternal aunt, maternal grandfather, maternal grandmother, mother, and sister; Heart disease in her maternal grandfather and maternal grandmother; Hyperlipidemia in her mother; Hypertension in her maternal grandfather, maternal grandmother, and mother; Kidney cancer in her maternal grandmother; Migraines in her child, child, maternal grandfather, and mother; Obesity in her maternal aunt, maternal uncle, mother, and sister; Ovarian cancer in her mother; Stroke in her maternal grandfather; Varicose Veins in her father; Vision loss in her maternal grandmother.  Laboratory Chemistry Profile   Renal Lab Results  Component Value Date   BUN 15 09/13/2023   CREATININE 0.82 09/13/2023   BCR 18 09/13/2023   GFR 94.11 08/19/2020   GFRAA 120 03/02/2018   GFRNONAA >60 11/21/2021    Hepatic Lab Results   Component Value Date   AST 19 09/13/2023   ALT 25 09/13/2023   ALBUMIN 4.4 09/13/2023   ALKPHOS 86 09/13/2023   LIPASE 34 11/21/2021    Electrolytes Lab Results  Component Value Date   NA 141 09/13/2023   K 3.8 09/13/2023   CL 102 09/13/2023   CALCIUM 9.6 09/13/2023   MG 2.0 11/15/2023    Bone Lab Results  Component Value Date   VD25OH 30.7 10/19/2022   25OHVITD1 28 (L) 11/15/2023   25OHVITD2 <1.0 11/15/2023   25OHVITD3 28 11/15/2023   TESTOFREE 0.7 11/27/2020   TESTOSTERONE  16 11/27/2020    Inflammation (CRP: Acute Phase) (ESR: Chronic Phase) Lab Results  Component Value Date   CRP 1 11/15/2023   ESRSEDRATE 10 11/15/2023         Note: Above Lab results reviewed.  Recent Imaging Review  DG PAIN CLINIC C-ARM 1-60 MIN NO REPORT Fluoro was used, but no Radiologist interpretation will be provided.  Please refer to NOTES tab for provider progress note. Note: Reviewed        Physical Exam  Vitals: BP 117/78   Pulse 95   Temp (!) 97.3 F (36.3 C)   Resp 16   Ht 5' 8 (1.727 m)   Wt 194 lb (88 kg)   SpO2 100%   BMI 29.50 kg/m  BMI: Estimated body mass index is 29.5 kg/m as calculated from the following:   Height as of this encounter: 5' 8 (1.727 m).   Weight as of this encounter: 194 lb (88 kg). Ideal: Ideal body weight: 63.9 kg (140 lb 14 oz) Adjusted ideal body weight: 73.5 kg (162 lb 2 oz) General appearance: Well nourished, well developed, and well hydrated. In no apparent acute distress Mental status: Alert, oriented x 3 (person, place, & time)       Respiratory: No evidence of acute respiratory distress Eyes: PERLA   Assessment   Diagnosis Status  1. Chronic sacroiliac joint pain (Bilateral) (L>R)   2. Chronic low back pain (1ry area of Pain) (Bilateral) (L>R) w/o sciatica   3. Low back pain of over 3 months duration   4. Multifactorial low back pain   5. Postop check    Controlled Controlled Controlled   Updated Problems: No problems  updated.  Plan of Care  Problem-specific:  Assessment and Plan    Chronic bilateral low back pain   Chronic bilateral low back pain has significantly improved following radiofrequency ablation, with the right side treated on March 19, 2024, and the  left side on February 23, 2024. She reports 80% relief on the right side and 70% relief on the left, with pain levels now tolerable at 2 to 3 out of 10 daily. Continue magnesium and ibuprofen  for pain management, ensuring ibuprofen  is taken with food. Monitor blood pressure and kidney function periodically due to ibuprofen  use. Consider using turmeric as an alternative anti-inflammatory, with a cycle of four weeks on and one week off. Provide information on TENS unit for additional pain management. Contact provider if pain worsens or if additional interventions are needed.       Ms. PRACHI OFTEDAHL has a current medication list which includes the following long-term medication(s): budesonide -formoterol , bupropion , gabapentin , levonorgestrel , phentermine , sumatriptan , and [DISCONTINUED] azelastine .  Pharmacotherapy (Medications Ordered): No orders of the defined types were placed in this encounter.  Orders:  Orders Placed This Encounter  Procedures   Nursing Instructions:    Please complete this patient's postprocedure evaluation.    Scheduling Instructions:     Please complete this patient's postprocedure evaluation.     Interventional Therapies  Risk Factors  Considerations  Medical Comorbidities:     Planned  Pending:   Therapeutic right SI RFA #1 (03/19/2024)    Under consideration:   Therapeutic bilateral SI RFA #1 (starting with the left side)    Completed:   Therapeutic left SI RFA x1 (02/29/2024) (100/100/100/100)  Diagnostic bilateral SI joint block x2 (02/01/2024) (100/100/75/75)  Diagnostic/therapeutic lumbar facet MBB x1 (11/28/2023) (75/75/80/R:80L:0)    Therapeutic  Palliative (PRN) options:   None established    Completed by other providers:   Diagnostic/therapeutic left interlaminar L5-S1 LESI x2 (05/28/2020) by Elsie Shoulder, MD [facility])      Return if symptoms worsen or fail to improve.    Recent Visits Date Type Provider Dept  03/19/24 Procedure visit Tanya Glisson, MD [facility]  02/29/24 Procedure visit Tanya Glisson, MD [facility]  02/14/24 Office Visit Tanya Glisson, MD [facility]  02/01/24 Procedure visit Tanya Glisson, MD [facility]  Showing recent visits within past 90 days and meeting all other requirements Today's Visits Date Type Provider Dept  04/30/24 Office Visit Tanya Glisson, MD [facility]  Showing today's visits and meeting all other requirements Future Appointments No visits were found meeting these conditions. Showing future appointments within next 90 days and meeting all other requirements  I discussed the assessment and treatment plan with the patient. The patient was provided an opportunity to ask questions and all were answered. The patient agreed with the plan and demonstrated an understanding of the instructions.  Patient advised to call back or seek an in-person evaluation if the symptoms or condition worsens.  Duration of encounter: 25 minutes.  Total time on encounter, as per AMA guidelines included both the face-to-face and non-face-to-face time personally spent by the physician and/or other qualified health care professional(s) on the day of the encounter (includes time in activities that require the physician or other qualified health care professional and does not include time in activities normally performed by clinical staff). Physician's time may include the following activities when performed: Preparing to see the patient (e.g., pre-charting review of records, searching for previously ordered imaging, lab work, and nerve conduction tests) Review of prior  analgesic pharmacotherapies. Reviewing PMP Interpreting ordered tests (e.g., lab work, imaging, nerve conduction tests) Performing post-procedure evaluations, including interpretation of diagnostic procedures Obtaining and/or reviewing separately obtained history Performing a medically appropriate examination and/or evaluation Counseling and educating the  patient/family/caregiver Ordering medications, tests, or procedures Referring and communicating with other health care professionals (when not separately reported) Documenting clinical information in the electronic or other health record Independently interpreting results (not separately reported) and communicating results to the patient/ family/caregiver Care coordination (not separately reported)  Note by: Eric DELENA Como, MD (TTS and AI technology used. I apologize for any typographical errors that were not detected and corrected.) Date: 04/30/2024; Time: 2:31 PM

## 2024-06-12 ENCOUNTER — Encounter: Payer: Self-pay | Admitting: Nurse Practitioner

## 2024-06-13 ENCOUNTER — Other Ambulatory Visit: Payer: Self-pay | Admitting: Nurse Practitioner

## 2024-06-13 ENCOUNTER — Other Ambulatory Visit: Payer: Self-pay

## 2024-06-13 MED FILL — Sumatriptan Succinate Tab 100 MG: ORAL | 15 days supply | Qty: 9 | Fill #3 | Status: AC

## 2024-06-14 ENCOUNTER — Other Ambulatory Visit: Payer: Self-pay

## 2024-06-17 ENCOUNTER — Other Ambulatory Visit: Payer: Self-pay

## 2024-06-17 MED FILL — Bupropion HCl Tab ER 24HR 300 MG: ORAL | 90 days supply | Qty: 90 | Fill #0 | Status: CN

## 2024-06-17 NOTE — Telephone Encounter (Signed)
 OFFICE VISIT NEEDED FOR ADDITIONAL REFILLS   Requested Prescriptions  Pending Prescriptions Disp Refills   buPROPion  (WELLBUTRIN  XL) 300 MG 24 hr tablet 90 tablet 0    Sig: Take 1 tablet (300 mg total) by mouth daily.     Psychiatry: Antidepressants - bupropion  Failed - 06/17/2024  9:21 AM      Failed - Valid encounter within last 6 months    Recent Outpatient Visits           8 months ago Acute on chronic low back pain   [place] [facility] [place], [place] T, NP              Passed - Cr in normal range and within 360 days    Creat  Date Value Ref Range Status  05/13/2020 1.02 0.50 - 1.10 mg/dL Final   Creatinine, Ser  Date Value Ref Range Status  09/13/2023 0.82 0.57 - 1.00 mg/dL Final         Passed - AST in normal range and within 360 days    AST  Date Value Ref Range Status  09/13/2023 19 0 - 40 IU/L Final         Passed - ALT in normal range and within 360 days    ALT  Date Value Ref Range Status  09/13/2023 25 0 - 32 IU/L Final         Passed - Completed PHQ-2 or PHQ-9 in the last 360 days      Passed - Last BP in normal range    BP Readings from Last 1 Encounters:  04/30/24 117/78

## 2024-06-27 ENCOUNTER — Other Ambulatory Visit: Payer: Self-pay

## 2024-07-09 ENCOUNTER — Other Ambulatory Visit (HOSPITAL_COMMUNITY): Payer: Self-pay

## 2024-07-09 ENCOUNTER — Other Ambulatory Visit: Payer: Self-pay

## 2024-07-09 NOTE — Progress Notes (Signed)
 Specialty Pharmacy Refill Coordination Note  Jennifer Morse is a 33 y.o. female assessed today regarding refills of clinic administered specialty medication(s) OnabotulinumtoxinA  (BOTOX )   Clinic requested Delivery   Delivery date: 07/11/24   Verified address: [facility] [address] [place]   Medication will be filled on: 07/10/24

## 2024-07-10 ENCOUNTER — Other Ambulatory Visit: Payer: Self-pay

## 2024-07-16 ENCOUNTER — Encounter: Payer: Self-pay | Admitting: Adult Health

## 2024-07-16 ENCOUNTER — Ambulatory Visit: Admitting: Adult Health

## 2024-07-16 VITALS — BP 112/70 | HR 73

## 2024-07-16 DIAGNOSIS — G43711 Chronic migraine without aura, intractable, with status migrainosus: Secondary | ICD-10-CM

## 2024-07-16 DIAGNOSIS — G43009 Migraine without aura, not intractable, without status migrainosus: Secondary | ICD-10-CM

## 2024-07-16 MED ORDER — ONABOTULINUMTOXINA 200 UNITS IJ SOLR
155.0000 [IU] | Freq: Once | INTRAMUSCULAR | Status: AC
  Administered 2024-07-16: 155 [IU] via INTRAMUSCULAR

## 2024-07-16 NOTE — Progress Notes (Signed)
 Update 07/16/2024 JM: Patient is being seen for repeat Botox  injection.  Prior injection 04/23/2024.  She reports continued benefit with Botox  with >50% migraine reduction.  She does report benefit with Ubrelvy . Still has Imitrex  as needed. Tolerated procedure well today.  Return in 3 months for repeat injection.       Consent Form Botulism Toxin Injection For Chronic Migraine    Reviewed orally with patient, additionally signature is on file:  Botulism toxin has been approved by the Federal drug administration for treatment of chronic migraine. Botulism toxin does not cure chronic migraine and it may not be effective in some patients.  The administration of botulism toxin is accomplished by injecting a small amount of toxin into the muscles of the neck and head. Dosage must be titrated for each individual. Any benefits resulting from botulism toxin tend to wear off after 3 months with a repeat injection required if benefit is to be maintained. Injections are usually done every 3-4 months with maximum effect peak achieved by about 2 or 3 weeks. Botulism toxin is expensive and you should be sure of what costs you will incur resulting from the injection.  The side effects of botulism toxin use for chronic migraine may include:   -Transient, and usually mild, facial weakness with facial injections  -Transient, and usually mild, head or neck weakness with head/neck injections  -Reduction or loss of forehead facial animation due to forehead muscle weakness  -Eyelid drooping  -Dry eye  -Pain at the site of injection or bruising at the site of injection  -Double vision  -Potential unknown long term risks   Contraindications: You should not have Botox  if you are pregnant, nursing, allergic to albumin, have an infection, skin condition, or muscle weakness at the site of the injection, or have myasthenia gravis, Lambert-Eaton syndrome, or ALS.  It is also possible that as with any  injection, there may be an allergic reaction or no effect from the medication. Reduced effectiveness after repeated injections is sometimes seen and rarely infection at the injection site may occur. All care will be taken to prevent these side effects. If therapy is given over a long time, atrophy and wasting in the muscle injected may occur. Occasionally the patient's become refractory to treatment because they develop antibodies to the toxin. In this event, therapy needs to be modified.  I have read the above information and consent to the administration of botulism toxin.    BOTOX  PROCEDURE NOTE FOR MIGRAINE HEADACHE  Contraindications and precautions discussed with patient(above). Aseptic procedure was observed and patient tolerated procedure. Procedure performed by Harlene Bogaert, AGNP-BC.   The condition has existed for more than 6 months, and pt does not have a diagnosis of ALS, Myasthenia Gravis or Lambert-Eaton Syndrome.  Risks and benefits of injections discussed and pt agrees to proceed with the procedure.  Written consent obtained  These injections are medically necessary. Pt  receives good benefits from these injections. These injections do not cause sedations or hallucinations which the oral therapies may cause.   Description of procedure:  The patient was placed in a sitting position. The standard protocol was used for Botox  as follows, with 5 units of Botox  injected at each site:  -Procerus muscle, midline injection  -Corrugator muscle, bilateral injection  -Frontalis muscle, bilateral injection, with 2 sites each side, medial injection was performed in the upper one third of the frontalis muscle, in the region vertical from the medial inferior edge of the  superior orbital rim. The lateral injection was again in the upper one third of the forehead vertically above the lateral limbus of the cornea, 1.5 cm lateral to the medial injection site.  -Temporalis muscle injection, 4  sites, bilaterally. The first injection was 3 cm above the tragus of the ear, second injection site was 1.5 cm to 3 cm up from the first injection site in line with the tragus of the ear. The third injection site was 1.5-3 cm forward between the first 2 injection sites. The fourth injection site was 1.5 cm posterior to the second injection site. 5th site laterally in the temporalis  muscleat the level of the outer canthus.  -Occipitalis muscle injection, 3 sites, bilaterally. The first injection was done one half way between the occipital protuberance and the tip of the mastoid process behind the ear. The second injection site was done lateral and superior to the first, 1 fingerbreadth from the first injection. The third injection site was 1 fingerbreadth superiorly and medially from the first injection site.  -Cervical paraspinal muscle injection, 2 sites, bilaterally. The first injection site was 1 cm from the midline of the cervical spine, 3 cm inferior to the lower border of the occipital protuberance. The second injection site was 1.5 cm superiorly and laterally to the first injection site.  -Trapezius muscle injection was performed at 3 sites, bilaterally. The first injection site was in the upper trapezius muscle halfway between the inflection point of the neck, and the acromion. The second injection site was one half way between the acromion and the first injection site. The third injection was done between the first injection site and the inflection point of the neck.    A total of 200 units of Botox  was prepared, 155 units of Botox  was injected as documented above, any Botox  not injected was wasted. The patient tolerated the procedure well, there were no complications of the above procedure.   Harlene Bogaert, AGNP-BC  [facility] [address] [place], [place] [address]  Phone [phone] Fax [phone] Note: This document was prepared with digital  dictation and possible smart phrase technology. Any transcriptional errors that result from this process are unintentional.

## 2024-07-16 NOTE — Progress Notes (Signed)
 Botox - 200 units x 1 vial Lot: [id number] Expiration: 6/27 NDC: 9976-6078-97  Bacteriostatic 0.9% Sodium Chloride - 4 mL  Lot: [id number] Expiration: 07/05/25 NDC: 9590-8033-97  Dx: H56.990 S/P  Witnessed by Heather, RN

## 2024-08-08 ENCOUNTER — Telehealth: Payer: Self-pay | Admitting: Adult Health

## 2024-08-08 NOTE — Telephone Encounter (Signed)
 Pt has r/s the appointment

## 2024-08-08 NOTE — Telephone Encounter (Signed)
 MYC cxl

## 2024-08-28 ENCOUNTER — Telehealth: Payer: Self-pay

## 2024-08-28 NOTE — Telephone Encounter (Signed)
 Pharmacy Patient Advocate Encounter   Received notification from CoverMyMeds that prior authorization for Ubrelvy  is required/requested.   Insurance verification completed.   The patient is insured through [facility].   Per test claim: PA required; PA submitted to above mentioned insurance via Latent Key/confirmation #/[facility] Status is pending

## 2024-08-28 NOTE — Telephone Encounter (Signed)
 Pharmacy Patient Advocate Encounter  Received notification from [facility] that Prior Authorization for Ubrelvy  has been APPROVED from 08/28/2024 to 08/27/2025   PA #/Case ID/Reference #: [id number]

## 2024-08-29 ENCOUNTER — Other Ambulatory Visit: Payer: Self-pay | Admitting: Nurse Practitioner

## 2024-08-30 ENCOUNTER — Other Ambulatory Visit: Payer: Self-pay | Admitting: Nurse Practitioner

## 2024-08-30 ENCOUNTER — Other Ambulatory Visit: Payer: Self-pay

## 2024-09-02 ENCOUNTER — Other Ambulatory Visit: Payer: Self-pay

## 2024-09-02 MED FILL — Sumatriptan Succinate Tab 100 MG: ORAL | 15 days supply | Qty: 9 | Fill #0 | Status: AC

## 2024-09-27 ENCOUNTER — Other Ambulatory Visit: Payer: Self-pay

## 2024-10-03 ENCOUNTER — Other Ambulatory Visit: Payer: Self-pay

## 2024-10-03 MED ORDER — NEOMYCIN-POLYMYXIN-DEXAMETH 0.1 % OP SUSP
OPHTHALMIC | 0 refills | Status: AC
  Filled 2024-10-03: qty 5, 14d supply, fill #0

## 2024-10-03 MED ORDER — DOXYCYCLINE HYCLATE 100 MG PO TABS
100.0000 mg | ORAL_TABLET | Freq: Every day | ORAL | 0 refills | Status: AC
  Filled 2024-10-03: qty 30, 30d supply, fill #0

## 2024-10-10 ENCOUNTER — Ambulatory Visit: Admitting: Adult Health

## 2024-10-14 ENCOUNTER — Ambulatory Visit: Admitting: Adult Health

## 2024-11-07 ENCOUNTER — Ambulatory Visit: Admitting: Adult Health
